# Patient Record
Sex: Female | Born: 1935 | Race: White | Hispanic: No | Marital: Married | Smoking: Never smoker
Health system: Southern US, Community
[De-identification: ages and names within clinical notes are randomized; demographics above are authoritative.]

## PROBLEM LIST (undated history)

## (undated) DIAGNOSIS — I2510 Atherosclerotic heart disease of native coronary artery without angina pectoris: Secondary | ICD-10-CM

## (undated) DIAGNOSIS — J984 Other disorders of lung: Secondary | ICD-10-CM

## (undated) DIAGNOSIS — M1990 Unspecified osteoarthritis, unspecified site: Secondary | ICD-10-CM

## (undated) DIAGNOSIS — R6251 Failure to thrive (child): Secondary | ICD-10-CM

## (undated) DIAGNOSIS — I10 Essential (primary) hypertension: Secondary | ICD-10-CM

## (undated) DIAGNOSIS — I639 Cerebral infarction, unspecified: Secondary | ICD-10-CM

## (undated) DIAGNOSIS — E079 Disorder of thyroid, unspecified: Secondary | ICD-10-CM

## (undated) DIAGNOSIS — J189 Pneumonia, unspecified organism: Secondary | ICD-10-CM

## (undated) DIAGNOSIS — D649 Anemia, unspecified: Secondary | ICD-10-CM

## (undated) DIAGNOSIS — S065X9A Traumatic subdural hemorrhage with loss of consciousness of unspecified duration, initial encounter: Secondary | ICD-10-CM

## (undated) DIAGNOSIS — S065XAA Traumatic subdural hemorrhage with loss of consciousness status unknown, initial encounter: Secondary | ICD-10-CM

## (undated) DIAGNOSIS — E46 Unspecified protein-calorie malnutrition: Secondary | ICD-10-CM

## (undated) DIAGNOSIS — F0390 Unspecified dementia without behavioral disturbance: Secondary | ICD-10-CM

## (undated) DIAGNOSIS — I69391 Dysphagia following cerebral infarction: Secondary | ICD-10-CM

## (undated) DIAGNOSIS — E119 Type 2 diabetes mellitus without complications: Secondary | ICD-10-CM

---

## 2015-03-13 DIAGNOSIS — I6529 Occlusion and stenosis of unspecified carotid artery: Secondary | ICD-10-CM | POA: Diagnosis not present

## 2015-03-13 DIAGNOSIS — Z7982 Long term (current) use of aspirin: Secondary | ICD-10-CM | POA: Diagnosis not present

## 2015-03-13 DIAGNOSIS — I10 Essential (primary) hypertension: Secondary | ICD-10-CM | POA: Diagnosis not present

## 2015-03-13 DIAGNOSIS — E119 Type 2 diabetes mellitus without complications: Secondary | ICD-10-CM | POA: Diagnosis not present

## 2015-03-13 DIAGNOSIS — L409 Psoriasis, unspecified: Secondary | ICD-10-CM | POA: Diagnosis not present

## 2015-03-13 DIAGNOSIS — E039 Hypothyroidism, unspecified: Secondary | ICD-10-CM | POA: Diagnosis not present

## 2015-03-13 DIAGNOSIS — I679 Cerebrovascular disease, unspecified: Secondary | ICD-10-CM | POA: Diagnosis not present

## 2015-03-13 DIAGNOSIS — L02211 Cutaneous abscess of abdominal wall: Secondary | ICD-10-CM | POA: Diagnosis not present

## 2015-03-13 DIAGNOSIS — Z7984 Long term (current) use of oral hypoglycemic drugs: Secondary | ICD-10-CM | POA: Diagnosis not present

## 2015-03-13 DIAGNOSIS — M6281 Muscle weakness (generalized): Secondary | ICD-10-CM | POA: Diagnosis not present

## 2015-03-13 DIAGNOSIS — R001 Bradycardia, unspecified: Secondary | ICD-10-CM | POA: Diagnosis not present

## 2015-03-13 DIAGNOSIS — Z87440 Personal history of urinary (tract) infections: Secondary | ICD-10-CM | POA: Diagnosis not present

## 2015-03-13 DIAGNOSIS — H409 Unspecified glaucoma: Secondary | ICD-10-CM | POA: Diagnosis not present

## 2015-03-13 DIAGNOSIS — E785 Hyperlipidemia, unspecified: Secondary | ICD-10-CM | POA: Diagnosis not present

## 2015-03-15 DIAGNOSIS — L02211 Cutaneous abscess of abdominal wall: Secondary | ICD-10-CM | POA: Diagnosis not present

## 2015-03-15 DIAGNOSIS — I679 Cerebrovascular disease, unspecified: Secondary | ICD-10-CM | POA: Diagnosis not present

## 2015-03-15 DIAGNOSIS — E119 Type 2 diabetes mellitus without complications: Secondary | ICD-10-CM | POA: Diagnosis not present

## 2015-03-15 DIAGNOSIS — E785 Hyperlipidemia, unspecified: Secondary | ICD-10-CM | POA: Diagnosis not present

## 2015-03-15 DIAGNOSIS — M6281 Muscle weakness (generalized): Secondary | ICD-10-CM | POA: Diagnosis not present

## 2015-03-15 DIAGNOSIS — I10 Essential (primary) hypertension: Secondary | ICD-10-CM | POA: Diagnosis not present

## 2015-03-17 DIAGNOSIS — I10 Essential (primary) hypertension: Secondary | ICD-10-CM | POA: Diagnosis not present

## 2015-03-17 DIAGNOSIS — E785 Hyperlipidemia, unspecified: Secondary | ICD-10-CM | POA: Diagnosis not present

## 2015-03-17 DIAGNOSIS — E119 Type 2 diabetes mellitus without complications: Secondary | ICD-10-CM | POA: Diagnosis not present

## 2015-03-17 DIAGNOSIS — M6281 Muscle weakness (generalized): Secondary | ICD-10-CM | POA: Diagnosis not present

## 2015-03-17 DIAGNOSIS — I679 Cerebrovascular disease, unspecified: Secondary | ICD-10-CM | POA: Diagnosis not present

## 2015-03-17 DIAGNOSIS — L02211 Cutaneous abscess of abdominal wall: Secondary | ICD-10-CM | POA: Diagnosis not present

## 2015-03-18 DIAGNOSIS — L02211 Cutaneous abscess of abdominal wall: Secondary | ICD-10-CM | POA: Diagnosis not present

## 2015-03-18 DIAGNOSIS — E119 Type 2 diabetes mellitus without complications: Secondary | ICD-10-CM | POA: Diagnosis not present

## 2015-03-18 DIAGNOSIS — Z09 Encounter for follow-up examination after completed treatment for conditions other than malignant neoplasm: Secondary | ICD-10-CM | POA: Diagnosis not present

## 2015-03-18 DIAGNOSIS — E559 Vitamin D deficiency, unspecified: Secondary | ICD-10-CM | POA: Diagnosis not present

## 2015-03-18 DIAGNOSIS — I10 Essential (primary) hypertension: Secondary | ICD-10-CM | POA: Diagnosis not present

## 2015-03-18 DIAGNOSIS — M810 Age-related osteoporosis without current pathological fracture: Secondary | ICD-10-CM | POA: Diagnosis not present

## 2015-03-18 DIAGNOSIS — E785 Hyperlipidemia, unspecified: Secondary | ICD-10-CM | POA: Diagnosis not present

## 2015-03-18 DIAGNOSIS — M6281 Muscle weakness (generalized): Secondary | ICD-10-CM | POA: Diagnosis not present

## 2015-03-18 DIAGNOSIS — I679 Cerebrovascular disease, unspecified: Secondary | ICD-10-CM | POA: Diagnosis not present

## 2015-03-19 DIAGNOSIS — L02211 Cutaneous abscess of abdominal wall: Secondary | ICD-10-CM | POA: Diagnosis not present

## 2015-03-19 DIAGNOSIS — Z719 Counseling, unspecified: Secondary | ICD-10-CM | POA: Diagnosis not present

## 2015-03-19 DIAGNOSIS — Z9889 Other specified postprocedural states: Secondary | ICD-10-CM | POA: Diagnosis not present

## 2015-03-20 DIAGNOSIS — E785 Hyperlipidemia, unspecified: Secondary | ICD-10-CM | POA: Diagnosis not present

## 2015-03-20 DIAGNOSIS — I10 Essential (primary) hypertension: Secondary | ICD-10-CM | POA: Diagnosis not present

## 2015-03-20 DIAGNOSIS — E119 Type 2 diabetes mellitus without complications: Secondary | ICD-10-CM | POA: Diagnosis not present

## 2015-03-20 DIAGNOSIS — I679 Cerebrovascular disease, unspecified: Secondary | ICD-10-CM | POA: Diagnosis not present

## 2015-03-20 DIAGNOSIS — L02211 Cutaneous abscess of abdominal wall: Secondary | ICD-10-CM | POA: Diagnosis not present

## 2015-03-20 DIAGNOSIS — M6281 Muscle weakness (generalized): Secondary | ICD-10-CM | POA: Diagnosis not present

## 2015-03-21 DIAGNOSIS — I679 Cerebrovascular disease, unspecified: Secondary | ICD-10-CM | POA: Diagnosis not present

## 2015-03-21 DIAGNOSIS — E785 Hyperlipidemia, unspecified: Secondary | ICD-10-CM | POA: Diagnosis not present

## 2015-03-21 DIAGNOSIS — E119 Type 2 diabetes mellitus without complications: Secondary | ICD-10-CM | POA: Diagnosis not present

## 2015-03-21 DIAGNOSIS — M6281 Muscle weakness (generalized): Secondary | ICD-10-CM | POA: Diagnosis not present

## 2015-03-21 DIAGNOSIS — I10 Essential (primary) hypertension: Secondary | ICD-10-CM | POA: Diagnosis not present

## 2015-03-21 DIAGNOSIS — L02211 Cutaneous abscess of abdominal wall: Secondary | ICD-10-CM | POA: Diagnosis not present

## 2015-03-24 DIAGNOSIS — M6281 Muscle weakness (generalized): Secondary | ICD-10-CM | POA: Diagnosis not present

## 2015-03-24 DIAGNOSIS — E119 Type 2 diabetes mellitus without complications: Secondary | ICD-10-CM | POA: Diagnosis not present

## 2015-03-24 DIAGNOSIS — L02211 Cutaneous abscess of abdominal wall: Secondary | ICD-10-CM | POA: Diagnosis not present

## 2015-03-24 DIAGNOSIS — I10 Essential (primary) hypertension: Secondary | ICD-10-CM | POA: Diagnosis not present

## 2015-03-24 DIAGNOSIS — E785 Hyperlipidemia, unspecified: Secondary | ICD-10-CM | POA: Diagnosis not present

## 2015-03-24 DIAGNOSIS — I679 Cerebrovascular disease, unspecified: Secondary | ICD-10-CM | POA: Diagnosis not present

## 2015-03-26 DIAGNOSIS — I679 Cerebrovascular disease, unspecified: Secondary | ICD-10-CM | POA: Diagnosis not present

## 2015-03-26 DIAGNOSIS — E785 Hyperlipidemia, unspecified: Secondary | ICD-10-CM | POA: Diagnosis not present

## 2015-03-26 DIAGNOSIS — I10 Essential (primary) hypertension: Secondary | ICD-10-CM | POA: Diagnosis not present

## 2015-03-26 DIAGNOSIS — E119 Type 2 diabetes mellitus without complications: Secondary | ICD-10-CM | POA: Diagnosis not present

## 2015-03-26 DIAGNOSIS — M6281 Muscle weakness (generalized): Secondary | ICD-10-CM | POA: Diagnosis not present

## 2015-03-26 DIAGNOSIS — L02211 Cutaneous abscess of abdominal wall: Secondary | ICD-10-CM | POA: Diagnosis not present

## 2015-03-28 DIAGNOSIS — I10 Essential (primary) hypertension: Secondary | ICD-10-CM | POA: Diagnosis not present

## 2015-03-28 DIAGNOSIS — I679 Cerebrovascular disease, unspecified: Secondary | ICD-10-CM | POA: Diagnosis not present

## 2015-03-28 DIAGNOSIS — L02211 Cutaneous abscess of abdominal wall: Secondary | ICD-10-CM | POA: Diagnosis not present

## 2015-03-28 DIAGNOSIS — E119 Type 2 diabetes mellitus without complications: Secondary | ICD-10-CM | POA: Diagnosis not present

## 2015-03-28 DIAGNOSIS — M6281 Muscle weakness (generalized): Secondary | ICD-10-CM | POA: Diagnosis not present

## 2015-03-28 DIAGNOSIS — E785 Hyperlipidemia, unspecified: Secondary | ICD-10-CM | POA: Diagnosis not present

## 2015-03-31 DIAGNOSIS — E785 Hyperlipidemia, unspecified: Secondary | ICD-10-CM | POA: Diagnosis not present

## 2015-03-31 DIAGNOSIS — M6281 Muscle weakness (generalized): Secondary | ICD-10-CM | POA: Diagnosis not present

## 2015-03-31 DIAGNOSIS — L02211 Cutaneous abscess of abdominal wall: Secondary | ICD-10-CM | POA: Diagnosis not present

## 2015-03-31 DIAGNOSIS — E119 Type 2 diabetes mellitus without complications: Secondary | ICD-10-CM | POA: Diagnosis not present

## 2015-03-31 DIAGNOSIS — I10 Essential (primary) hypertension: Secondary | ICD-10-CM | POA: Diagnosis not present

## 2015-03-31 DIAGNOSIS — I679 Cerebrovascular disease, unspecified: Secondary | ICD-10-CM | POA: Diagnosis not present

## 2015-04-02 DIAGNOSIS — I10 Essential (primary) hypertension: Secondary | ICD-10-CM | POA: Diagnosis not present

## 2015-04-02 DIAGNOSIS — Z8673 Personal history of transient ischemic attack (TIA), and cerebral infarction without residual deficits: Secondary | ICD-10-CM | POA: Diagnosis not present

## 2015-04-02 DIAGNOSIS — K5792 Diverticulitis of intestine, part unspecified, without perforation or abscess without bleeding: Secondary | ICD-10-CM | POA: Diagnosis not present

## 2015-04-02 DIAGNOSIS — I679 Cerebrovascular disease, unspecified: Secondary | ICD-10-CM | POA: Diagnosis not present

## 2015-04-02 DIAGNOSIS — T8189XA Other complications of procedures, not elsewhere classified, initial encounter: Secondary | ICD-10-CM | POA: Diagnosis not present

## 2015-04-02 DIAGNOSIS — E119 Type 2 diabetes mellitus without complications: Secondary | ICD-10-CM | POA: Diagnosis not present

## 2015-04-02 DIAGNOSIS — K632 Fistula of intestine: Secondary | ICD-10-CM | POA: Diagnosis not present

## 2015-04-02 DIAGNOSIS — R109 Unspecified abdominal pain: Secondary | ICD-10-CM | POA: Diagnosis not present

## 2015-04-02 DIAGNOSIS — F0390 Unspecified dementia without behavioral disturbance: Secondary | ICD-10-CM | POA: Diagnosis not present

## 2015-04-02 DIAGNOSIS — M6281 Muscle weakness (generalized): Secondary | ICD-10-CM | POA: Diagnosis not present

## 2015-04-02 DIAGNOSIS — Z87891 Personal history of nicotine dependence: Secondary | ICD-10-CM | POA: Diagnosis not present

## 2015-04-02 DIAGNOSIS — L02211 Cutaneous abscess of abdominal wall: Secondary | ICD-10-CM | POA: Diagnosis not present

## 2015-04-02 DIAGNOSIS — L0291 Cutaneous abscess, unspecified: Secondary | ICD-10-CM | POA: Diagnosis not present

## 2015-04-02 DIAGNOSIS — E785 Hyperlipidemia, unspecified: Secondary | ICD-10-CM | POA: Diagnosis not present

## 2015-04-04 DIAGNOSIS — I10 Essential (primary) hypertension: Secondary | ICD-10-CM | POA: Diagnosis not present

## 2015-04-04 DIAGNOSIS — L02211 Cutaneous abscess of abdominal wall: Secondary | ICD-10-CM | POA: Diagnosis not present

## 2015-04-04 DIAGNOSIS — E119 Type 2 diabetes mellitus without complications: Secondary | ICD-10-CM | POA: Diagnosis not present

## 2015-04-04 DIAGNOSIS — E785 Hyperlipidemia, unspecified: Secondary | ICD-10-CM | POA: Diagnosis not present

## 2015-04-04 DIAGNOSIS — M6281 Muscle weakness (generalized): Secondary | ICD-10-CM | POA: Diagnosis not present

## 2015-04-04 DIAGNOSIS — I679 Cerebrovascular disease, unspecified: Secondary | ICD-10-CM | POA: Diagnosis not present

## 2015-04-07 DIAGNOSIS — I679 Cerebrovascular disease, unspecified: Secondary | ICD-10-CM | POA: Diagnosis not present

## 2015-04-07 DIAGNOSIS — E119 Type 2 diabetes mellitus without complications: Secondary | ICD-10-CM | POA: Diagnosis not present

## 2015-04-07 DIAGNOSIS — I10 Essential (primary) hypertension: Secondary | ICD-10-CM | POA: Diagnosis not present

## 2015-04-07 DIAGNOSIS — M6281 Muscle weakness (generalized): Secondary | ICD-10-CM | POA: Diagnosis not present

## 2015-04-07 DIAGNOSIS — E785 Hyperlipidemia, unspecified: Secondary | ICD-10-CM | POA: Diagnosis not present

## 2015-04-07 DIAGNOSIS — L02211 Cutaneous abscess of abdominal wall: Secondary | ICD-10-CM | POA: Diagnosis not present

## 2015-04-08 DIAGNOSIS — L259 Unspecified contact dermatitis, unspecified cause: Secondary | ICD-10-CM | POA: Diagnosis not present

## 2015-04-08 DIAGNOSIS — Z719 Counseling, unspecified: Secondary | ICD-10-CM | POA: Diagnosis not present

## 2015-04-08 DIAGNOSIS — L02211 Cutaneous abscess of abdominal wall: Secondary | ICD-10-CM | POA: Diagnosis not present

## 2015-04-09 DIAGNOSIS — E785 Hyperlipidemia, unspecified: Secondary | ICD-10-CM | POA: Diagnosis not present

## 2015-04-09 DIAGNOSIS — I679 Cerebrovascular disease, unspecified: Secondary | ICD-10-CM | POA: Diagnosis not present

## 2015-04-09 DIAGNOSIS — I10 Essential (primary) hypertension: Secondary | ICD-10-CM | POA: Diagnosis not present

## 2015-04-09 DIAGNOSIS — L02211 Cutaneous abscess of abdominal wall: Secondary | ICD-10-CM | POA: Diagnosis not present

## 2015-04-09 DIAGNOSIS — M6281 Muscle weakness (generalized): Secondary | ICD-10-CM | POA: Diagnosis not present

## 2015-04-09 DIAGNOSIS — E119 Type 2 diabetes mellitus without complications: Secondary | ICD-10-CM | POA: Diagnosis not present

## 2015-04-10 DIAGNOSIS — E119 Type 2 diabetes mellitus without complications: Secondary | ICD-10-CM | POA: Diagnosis not present

## 2015-04-10 DIAGNOSIS — I10 Essential (primary) hypertension: Secondary | ICD-10-CM | POA: Diagnosis not present

## 2015-04-10 DIAGNOSIS — I679 Cerebrovascular disease, unspecified: Secondary | ICD-10-CM | POA: Diagnosis not present

## 2015-04-10 DIAGNOSIS — L02211 Cutaneous abscess of abdominal wall: Secondary | ICD-10-CM | POA: Diagnosis not present

## 2015-04-10 DIAGNOSIS — E785 Hyperlipidemia, unspecified: Secondary | ICD-10-CM | POA: Diagnosis not present

## 2015-04-10 DIAGNOSIS — M6281 Muscle weakness (generalized): Secondary | ICD-10-CM | POA: Diagnosis not present

## 2015-04-11 DIAGNOSIS — I679 Cerebrovascular disease, unspecified: Secondary | ICD-10-CM | POA: Diagnosis not present

## 2015-04-11 DIAGNOSIS — L02211 Cutaneous abscess of abdominal wall: Secondary | ICD-10-CM | POA: Diagnosis not present

## 2015-04-11 DIAGNOSIS — E119 Type 2 diabetes mellitus without complications: Secondary | ICD-10-CM | POA: Diagnosis not present

## 2015-04-11 DIAGNOSIS — I10 Essential (primary) hypertension: Secondary | ICD-10-CM | POA: Diagnosis not present

## 2015-04-11 DIAGNOSIS — M6281 Muscle weakness (generalized): Secondary | ICD-10-CM | POA: Diagnosis not present

## 2015-04-11 DIAGNOSIS — E785 Hyperlipidemia, unspecified: Secondary | ICD-10-CM | POA: Diagnosis not present

## 2015-04-12 DIAGNOSIS — E119 Type 2 diabetes mellitus without complications: Secondary | ICD-10-CM | POA: Diagnosis not present

## 2015-04-12 DIAGNOSIS — I679 Cerebrovascular disease, unspecified: Secondary | ICD-10-CM | POA: Diagnosis not present

## 2015-04-12 DIAGNOSIS — I10 Essential (primary) hypertension: Secondary | ICD-10-CM | POA: Diagnosis not present

## 2015-04-12 DIAGNOSIS — E785 Hyperlipidemia, unspecified: Secondary | ICD-10-CM | POA: Diagnosis not present

## 2015-04-12 DIAGNOSIS — M6281 Muscle weakness (generalized): Secondary | ICD-10-CM | POA: Diagnosis not present

## 2015-04-12 DIAGNOSIS — L02211 Cutaneous abscess of abdominal wall: Secondary | ICD-10-CM | POA: Diagnosis not present

## 2015-04-13 DIAGNOSIS — E119 Type 2 diabetes mellitus without complications: Secondary | ICD-10-CM | POA: Diagnosis not present

## 2015-04-13 DIAGNOSIS — E785 Hyperlipidemia, unspecified: Secondary | ICD-10-CM | POA: Diagnosis not present

## 2015-04-13 DIAGNOSIS — Z87891 Personal history of nicotine dependence: Secondary | ICD-10-CM | POA: Diagnosis not present

## 2015-04-13 DIAGNOSIS — Z9889 Other specified postprocedural states: Secondary | ICD-10-CM | POA: Diagnosis not present

## 2015-04-13 DIAGNOSIS — L02211 Cutaneous abscess of abdominal wall: Secondary | ICD-10-CM | POA: Diagnosis not present

## 2015-04-13 DIAGNOSIS — B999 Unspecified infectious disease: Secondary | ICD-10-CM | POA: Diagnosis not present

## 2015-04-13 DIAGNOSIS — I10 Essential (primary) hypertension: Secondary | ICD-10-CM | POA: Diagnosis not present

## 2015-04-13 DIAGNOSIS — Z7982 Long term (current) use of aspirin: Secondary | ICD-10-CM | POA: Diagnosis not present

## 2015-04-13 DIAGNOSIS — Z933 Colostomy status: Secondary | ICD-10-CM | POA: Diagnosis not present

## 2015-04-13 DIAGNOSIS — M6281 Muscle weakness (generalized): Secondary | ICD-10-CM | POA: Diagnosis not present

## 2015-04-13 DIAGNOSIS — I679 Cerebrovascular disease, unspecified: Secondary | ICD-10-CM | POA: Diagnosis not present

## 2015-04-13 DIAGNOSIS — Z7984 Long term (current) use of oral hypoglycemic drugs: Secondary | ICD-10-CM | POA: Diagnosis not present

## 2015-04-13 DIAGNOSIS — N390 Urinary tract infection, site not specified: Secondary | ICD-10-CM | POA: Diagnosis not present

## 2015-04-13 DIAGNOSIS — T8189XA Other complications of procedures, not elsewhere classified, initial encounter: Secondary | ICD-10-CM | POA: Diagnosis not present

## 2015-04-13 DIAGNOSIS — Z9049 Acquired absence of other specified parts of digestive tract: Secondary | ICD-10-CM | POA: Diagnosis not present

## 2015-04-13 DIAGNOSIS — K632 Fistula of intestine: Secondary | ICD-10-CM | POA: Diagnosis not present

## 2015-04-13 DIAGNOSIS — Z8673 Personal history of transient ischemic attack (TIA), and cerebral infarction without residual deficits: Secondary | ICD-10-CM | POA: Diagnosis not present

## 2015-04-14 DIAGNOSIS — E785 Hyperlipidemia, unspecified: Secondary | ICD-10-CM | POA: Diagnosis not present

## 2015-04-14 DIAGNOSIS — I679 Cerebrovascular disease, unspecified: Secondary | ICD-10-CM | POA: Diagnosis not present

## 2015-04-14 DIAGNOSIS — I10 Essential (primary) hypertension: Secondary | ICD-10-CM | POA: Diagnosis not present

## 2015-04-14 DIAGNOSIS — E119 Type 2 diabetes mellitus without complications: Secondary | ICD-10-CM | POA: Diagnosis not present

## 2015-04-14 DIAGNOSIS — M6281 Muscle weakness (generalized): Secondary | ICD-10-CM | POA: Diagnosis not present

## 2015-04-14 DIAGNOSIS — L02211 Cutaneous abscess of abdominal wall: Secondary | ICD-10-CM | POA: Diagnosis not present

## 2015-04-15 DIAGNOSIS — L02211 Cutaneous abscess of abdominal wall: Secondary | ICD-10-CM | POA: Diagnosis not present

## 2015-04-15 DIAGNOSIS — M6281 Muscle weakness (generalized): Secondary | ICD-10-CM | POA: Diagnosis not present

## 2015-04-15 DIAGNOSIS — I679 Cerebrovascular disease, unspecified: Secondary | ICD-10-CM | POA: Diagnosis not present

## 2015-04-15 DIAGNOSIS — E785 Hyperlipidemia, unspecified: Secondary | ICD-10-CM | POA: Diagnosis not present

## 2015-04-15 DIAGNOSIS — E119 Type 2 diabetes mellitus without complications: Secondary | ICD-10-CM | POA: Diagnosis not present

## 2015-04-15 DIAGNOSIS — I10 Essential (primary) hypertension: Secondary | ICD-10-CM | POA: Diagnosis not present

## 2015-04-16 DIAGNOSIS — I679 Cerebrovascular disease, unspecified: Secondary | ICD-10-CM | POA: Diagnosis not present

## 2015-04-16 DIAGNOSIS — E119 Type 2 diabetes mellitus without complications: Secondary | ICD-10-CM | POA: Diagnosis not present

## 2015-04-16 DIAGNOSIS — E785 Hyperlipidemia, unspecified: Secondary | ICD-10-CM | POA: Diagnosis not present

## 2015-04-16 DIAGNOSIS — M6281 Muscle weakness (generalized): Secondary | ICD-10-CM | POA: Diagnosis not present

## 2015-04-16 DIAGNOSIS — I10 Essential (primary) hypertension: Secondary | ICD-10-CM | POA: Diagnosis not present

## 2015-04-16 DIAGNOSIS — L02211 Cutaneous abscess of abdominal wall: Secondary | ICD-10-CM | POA: Diagnosis not present

## 2015-04-17 DIAGNOSIS — E785 Hyperlipidemia, unspecified: Secondary | ICD-10-CM | POA: Diagnosis not present

## 2015-04-17 DIAGNOSIS — M6281 Muscle weakness (generalized): Secondary | ICD-10-CM | POA: Diagnosis not present

## 2015-04-17 DIAGNOSIS — E119 Type 2 diabetes mellitus without complications: Secondary | ICD-10-CM | POA: Diagnosis not present

## 2015-04-17 DIAGNOSIS — I679 Cerebrovascular disease, unspecified: Secondary | ICD-10-CM | POA: Diagnosis not present

## 2015-04-17 DIAGNOSIS — L02211 Cutaneous abscess of abdominal wall: Secondary | ICD-10-CM | POA: Diagnosis not present

## 2015-04-17 DIAGNOSIS — I10 Essential (primary) hypertension: Secondary | ICD-10-CM | POA: Diagnosis not present

## 2015-04-18 DIAGNOSIS — I10 Essential (primary) hypertension: Secondary | ICD-10-CM | POA: Diagnosis not present

## 2015-04-18 DIAGNOSIS — E119 Type 2 diabetes mellitus without complications: Secondary | ICD-10-CM | POA: Diagnosis not present

## 2015-04-18 DIAGNOSIS — M6281 Muscle weakness (generalized): Secondary | ICD-10-CM | POA: Diagnosis not present

## 2015-04-18 DIAGNOSIS — I679 Cerebrovascular disease, unspecified: Secondary | ICD-10-CM | POA: Diagnosis not present

## 2015-04-18 DIAGNOSIS — E785 Hyperlipidemia, unspecified: Secondary | ICD-10-CM | POA: Diagnosis not present

## 2015-04-18 DIAGNOSIS — L02211 Cutaneous abscess of abdominal wall: Secondary | ICD-10-CM | POA: Diagnosis not present

## 2015-04-19 DIAGNOSIS — E119 Type 2 diabetes mellitus without complications: Secondary | ICD-10-CM | POA: Diagnosis not present

## 2015-04-19 DIAGNOSIS — L02211 Cutaneous abscess of abdominal wall: Secondary | ICD-10-CM | POA: Diagnosis not present

## 2015-04-19 DIAGNOSIS — I679 Cerebrovascular disease, unspecified: Secondary | ICD-10-CM | POA: Diagnosis not present

## 2015-04-19 DIAGNOSIS — I10 Essential (primary) hypertension: Secondary | ICD-10-CM | POA: Diagnosis not present

## 2015-04-19 DIAGNOSIS — E785 Hyperlipidemia, unspecified: Secondary | ICD-10-CM | POA: Diagnosis not present

## 2015-04-19 DIAGNOSIS — M6281 Muscle weakness (generalized): Secondary | ICD-10-CM | POA: Diagnosis not present

## 2015-04-20 DIAGNOSIS — I679 Cerebrovascular disease, unspecified: Secondary | ICD-10-CM | POA: Diagnosis not present

## 2015-04-20 DIAGNOSIS — E785 Hyperlipidemia, unspecified: Secondary | ICD-10-CM | POA: Diagnosis not present

## 2015-04-20 DIAGNOSIS — I10 Essential (primary) hypertension: Secondary | ICD-10-CM | POA: Diagnosis not present

## 2015-04-20 DIAGNOSIS — L02211 Cutaneous abscess of abdominal wall: Secondary | ICD-10-CM | POA: Diagnosis not present

## 2015-04-20 DIAGNOSIS — M6281 Muscle weakness (generalized): Secondary | ICD-10-CM | POA: Diagnosis not present

## 2015-04-20 DIAGNOSIS — E119 Type 2 diabetes mellitus without complications: Secondary | ICD-10-CM | POA: Diagnosis not present

## 2015-04-21 DIAGNOSIS — M6281 Muscle weakness (generalized): Secondary | ICD-10-CM | POA: Diagnosis not present

## 2015-04-21 DIAGNOSIS — L02211 Cutaneous abscess of abdominal wall: Secondary | ICD-10-CM | POA: Diagnosis not present

## 2015-04-21 DIAGNOSIS — I679 Cerebrovascular disease, unspecified: Secondary | ICD-10-CM | POA: Diagnosis not present

## 2015-04-21 DIAGNOSIS — E119 Type 2 diabetes mellitus without complications: Secondary | ICD-10-CM | POA: Diagnosis not present

## 2015-04-21 DIAGNOSIS — E785 Hyperlipidemia, unspecified: Secondary | ICD-10-CM | POA: Diagnosis not present

## 2015-04-21 DIAGNOSIS — I10 Essential (primary) hypertension: Secondary | ICD-10-CM | POA: Diagnosis not present

## 2015-04-23 DIAGNOSIS — E7800 Pure hypercholesterolemia, unspecified: Secondary | ICD-10-CM | POA: Diagnosis not present

## 2015-04-23 DIAGNOSIS — I10 Essential (primary) hypertension: Secondary | ICD-10-CM | POA: Diagnosis not present

## 2015-04-23 DIAGNOSIS — E119 Type 2 diabetes mellitus without complications: Secondary | ICD-10-CM | POA: Diagnosis not present

## 2015-04-23 DIAGNOSIS — E785 Hyperlipidemia, unspecified: Secondary | ICD-10-CM | POA: Diagnosis not present

## 2015-04-23 DIAGNOSIS — L02211 Cutaneous abscess of abdominal wall: Secondary | ICD-10-CM | POA: Diagnosis not present

## 2015-04-23 DIAGNOSIS — I679 Cerebrovascular disease, unspecified: Secondary | ICD-10-CM | POA: Diagnosis not present

## 2015-04-23 DIAGNOSIS — I130 Hypertensive heart and chronic kidney disease with heart failure and stage 1 through stage 4 chronic kidney disease, or unspecified chronic kidney disease: Secondary | ICD-10-CM | POA: Diagnosis not present

## 2015-04-23 DIAGNOSIS — M6281 Muscle weakness (generalized): Secondary | ICD-10-CM | POA: Diagnosis not present

## 2015-04-25 DIAGNOSIS — E119 Type 2 diabetes mellitus without complications: Secondary | ICD-10-CM | POA: Diagnosis not present

## 2015-04-25 DIAGNOSIS — L02211 Cutaneous abscess of abdominal wall: Secondary | ICD-10-CM | POA: Diagnosis not present

## 2015-04-25 DIAGNOSIS — E785 Hyperlipidemia, unspecified: Secondary | ICD-10-CM | POA: Diagnosis not present

## 2015-04-25 DIAGNOSIS — I10 Essential (primary) hypertension: Secondary | ICD-10-CM | POA: Diagnosis not present

## 2015-04-25 DIAGNOSIS — M6281 Muscle weakness (generalized): Secondary | ICD-10-CM | POA: Diagnosis not present

## 2015-04-25 DIAGNOSIS — I679 Cerebrovascular disease, unspecified: Secondary | ICD-10-CM | POA: Diagnosis not present

## 2015-04-28 DIAGNOSIS — E785 Hyperlipidemia, unspecified: Secondary | ICD-10-CM | POA: Diagnosis not present

## 2015-04-28 DIAGNOSIS — L02211 Cutaneous abscess of abdominal wall: Secondary | ICD-10-CM | POA: Diagnosis not present

## 2015-04-28 DIAGNOSIS — I679 Cerebrovascular disease, unspecified: Secondary | ICD-10-CM | POA: Diagnosis not present

## 2015-04-28 DIAGNOSIS — E119 Type 2 diabetes mellitus without complications: Secondary | ICD-10-CM | POA: Diagnosis not present

## 2015-04-28 DIAGNOSIS — M6281 Muscle weakness (generalized): Secondary | ICD-10-CM | POA: Diagnosis not present

## 2015-04-28 DIAGNOSIS — I10 Essential (primary) hypertension: Secondary | ICD-10-CM | POA: Diagnosis not present

## 2015-04-30 DIAGNOSIS — I679 Cerebrovascular disease, unspecified: Secondary | ICD-10-CM | POA: Diagnosis not present

## 2015-04-30 DIAGNOSIS — E785 Hyperlipidemia, unspecified: Secondary | ICD-10-CM | POA: Diagnosis not present

## 2015-04-30 DIAGNOSIS — L02211 Cutaneous abscess of abdominal wall: Secondary | ICD-10-CM | POA: Diagnosis not present

## 2015-04-30 DIAGNOSIS — I10 Essential (primary) hypertension: Secondary | ICD-10-CM | POA: Diagnosis not present

## 2015-04-30 DIAGNOSIS — M6281 Muscle weakness (generalized): Secondary | ICD-10-CM | POA: Diagnosis not present

## 2015-04-30 DIAGNOSIS — E119 Type 2 diabetes mellitus without complications: Secondary | ICD-10-CM | POA: Diagnosis not present

## 2015-05-02 DIAGNOSIS — E119 Type 2 diabetes mellitus without complications: Secondary | ICD-10-CM | POA: Diagnosis not present

## 2015-05-02 DIAGNOSIS — I679 Cerebrovascular disease, unspecified: Secondary | ICD-10-CM | POA: Diagnosis not present

## 2015-05-02 DIAGNOSIS — M6281 Muscle weakness (generalized): Secondary | ICD-10-CM | POA: Diagnosis not present

## 2015-05-02 DIAGNOSIS — L02211 Cutaneous abscess of abdominal wall: Secondary | ICD-10-CM | POA: Diagnosis not present

## 2015-05-02 DIAGNOSIS — I10 Essential (primary) hypertension: Secondary | ICD-10-CM | POA: Diagnosis not present

## 2015-05-02 DIAGNOSIS — E785 Hyperlipidemia, unspecified: Secondary | ICD-10-CM | POA: Diagnosis not present

## 2015-05-05 DIAGNOSIS — M6281 Muscle weakness (generalized): Secondary | ICD-10-CM | POA: Diagnosis not present

## 2015-05-05 DIAGNOSIS — I10 Essential (primary) hypertension: Secondary | ICD-10-CM | POA: Diagnosis not present

## 2015-05-05 DIAGNOSIS — E785 Hyperlipidemia, unspecified: Secondary | ICD-10-CM | POA: Diagnosis not present

## 2015-05-05 DIAGNOSIS — I679 Cerebrovascular disease, unspecified: Secondary | ICD-10-CM | POA: Diagnosis not present

## 2015-05-05 DIAGNOSIS — E119 Type 2 diabetes mellitus without complications: Secondary | ICD-10-CM | POA: Diagnosis not present

## 2015-05-05 DIAGNOSIS — L02211 Cutaneous abscess of abdominal wall: Secondary | ICD-10-CM | POA: Diagnosis not present

## 2015-05-07 DIAGNOSIS — L02211 Cutaneous abscess of abdominal wall: Secondary | ICD-10-CM | POA: Diagnosis not present

## 2015-05-07 DIAGNOSIS — M6281 Muscle weakness (generalized): Secondary | ICD-10-CM | POA: Diagnosis not present

## 2015-05-07 DIAGNOSIS — E119 Type 2 diabetes mellitus without complications: Secondary | ICD-10-CM | POA: Diagnosis not present

## 2015-05-07 DIAGNOSIS — E785 Hyperlipidemia, unspecified: Secondary | ICD-10-CM | POA: Diagnosis not present

## 2015-05-07 DIAGNOSIS — I679 Cerebrovascular disease, unspecified: Secondary | ICD-10-CM | POA: Diagnosis not present

## 2015-05-07 DIAGNOSIS — I10 Essential (primary) hypertension: Secondary | ICD-10-CM | POA: Diagnosis not present

## 2015-05-08 DIAGNOSIS — L02211 Cutaneous abscess of abdominal wall: Secondary | ICD-10-CM | POA: Diagnosis not present

## 2015-05-08 DIAGNOSIS — Z719 Counseling, unspecified: Secondary | ICD-10-CM | POA: Diagnosis not present

## 2015-05-09 DIAGNOSIS — L02211 Cutaneous abscess of abdominal wall: Secondary | ICD-10-CM | POA: Diagnosis not present

## 2015-05-09 DIAGNOSIS — E119 Type 2 diabetes mellitus without complications: Secondary | ICD-10-CM | POA: Diagnosis not present

## 2015-05-09 DIAGNOSIS — I10 Essential (primary) hypertension: Secondary | ICD-10-CM | POA: Diagnosis not present

## 2015-05-09 DIAGNOSIS — M6281 Muscle weakness (generalized): Secondary | ICD-10-CM | POA: Diagnosis not present

## 2015-05-09 DIAGNOSIS — I679 Cerebrovascular disease, unspecified: Secondary | ICD-10-CM | POA: Diagnosis not present

## 2015-05-09 DIAGNOSIS — E785 Hyperlipidemia, unspecified: Secondary | ICD-10-CM | POA: Diagnosis not present

## 2015-05-12 DIAGNOSIS — E039 Hypothyroidism, unspecified: Secondary | ICD-10-CM | POA: Diagnosis not present

## 2015-05-12 DIAGNOSIS — M6281 Muscle weakness (generalized): Secondary | ICD-10-CM | POA: Diagnosis not present

## 2015-05-12 DIAGNOSIS — I129 Hypertensive chronic kidney disease with stage 1 through stage 4 chronic kidney disease, or unspecified chronic kidney disease: Secondary | ICD-10-CM | POA: Diagnosis not present

## 2015-05-12 DIAGNOSIS — E785 Hyperlipidemia, unspecified: Secondary | ICD-10-CM | POA: Diagnosis not present

## 2015-05-12 DIAGNOSIS — T814XXS Infection following a procedure, sequela: Secondary | ICD-10-CM | POA: Diagnosis not present

## 2015-05-12 DIAGNOSIS — Z7982 Long term (current) use of aspirin: Secondary | ICD-10-CM | POA: Diagnosis not present

## 2015-05-12 DIAGNOSIS — E1122 Type 2 diabetes mellitus with diabetic chronic kidney disease: Secondary | ICD-10-CM | POA: Diagnosis not present

## 2015-05-12 DIAGNOSIS — I6529 Occlusion and stenosis of unspecified carotid artery: Secondary | ICD-10-CM | POA: Diagnosis not present

## 2015-05-12 DIAGNOSIS — E119 Type 2 diabetes mellitus without complications: Secondary | ICD-10-CM | POA: Diagnosis not present

## 2015-05-12 DIAGNOSIS — N189 Chronic kidney disease, unspecified: Secondary | ICD-10-CM | POA: Diagnosis not present

## 2015-05-12 DIAGNOSIS — Z933 Colostomy status: Secondary | ICD-10-CM | POA: Diagnosis not present

## 2015-05-12 DIAGNOSIS — S31109A Unspecified open wound of abdominal wall, unspecified quadrant without penetration into peritoneal cavity, initial encounter: Secondary | ICD-10-CM | POA: Diagnosis not present

## 2015-05-12 DIAGNOSIS — I10 Essential (primary) hypertension: Secondary | ICD-10-CM | POA: Diagnosis not present

## 2015-05-12 DIAGNOSIS — Z7984 Long term (current) use of oral hypoglycemic drugs: Secondary | ICD-10-CM | POA: Diagnosis not present

## 2015-05-12 DIAGNOSIS — R001 Bradycardia, unspecified: Secondary | ICD-10-CM | POA: Diagnosis not present

## 2015-05-12 DIAGNOSIS — Z87891 Personal history of nicotine dependence: Secondary | ICD-10-CM | POA: Diagnosis not present

## 2015-05-12 DIAGNOSIS — Z87440 Personal history of urinary (tract) infections: Secondary | ICD-10-CM | POA: Diagnosis not present

## 2015-05-12 DIAGNOSIS — H409 Unspecified glaucoma: Secondary | ICD-10-CM | POA: Diagnosis not present

## 2015-05-12 DIAGNOSIS — L02211 Cutaneous abscess of abdominal wall: Secondary | ICD-10-CM | POA: Diagnosis not present

## 2015-05-13 DIAGNOSIS — H409 Unspecified glaucoma: Secondary | ICD-10-CM | POA: Diagnosis not present

## 2015-05-13 DIAGNOSIS — M6281 Muscle weakness (generalized): Secondary | ICD-10-CM | POA: Diagnosis not present

## 2015-05-13 DIAGNOSIS — I10 Essential (primary) hypertension: Secondary | ICD-10-CM | POA: Diagnosis not present

## 2015-05-13 DIAGNOSIS — E785 Hyperlipidemia, unspecified: Secondary | ICD-10-CM | POA: Diagnosis not present

## 2015-05-13 DIAGNOSIS — E119 Type 2 diabetes mellitus without complications: Secondary | ICD-10-CM | POA: Diagnosis not present

## 2015-05-13 DIAGNOSIS — L02211 Cutaneous abscess of abdominal wall: Secondary | ICD-10-CM | POA: Diagnosis not present

## 2015-05-14 DIAGNOSIS — H409 Unspecified glaucoma: Secondary | ICD-10-CM | POA: Diagnosis not present

## 2015-05-14 DIAGNOSIS — L02211 Cutaneous abscess of abdominal wall: Secondary | ICD-10-CM | POA: Diagnosis not present

## 2015-05-14 DIAGNOSIS — E785 Hyperlipidemia, unspecified: Secondary | ICD-10-CM | POA: Diagnosis not present

## 2015-05-14 DIAGNOSIS — I10 Essential (primary) hypertension: Secondary | ICD-10-CM | POA: Diagnosis not present

## 2015-05-14 DIAGNOSIS — M6281 Muscle weakness (generalized): Secondary | ICD-10-CM | POA: Diagnosis not present

## 2015-05-14 DIAGNOSIS — E119 Type 2 diabetes mellitus without complications: Secondary | ICD-10-CM | POA: Diagnosis not present

## 2015-05-15 DIAGNOSIS — I10 Essential (primary) hypertension: Secondary | ICD-10-CM | POA: Diagnosis not present

## 2015-05-15 DIAGNOSIS — E119 Type 2 diabetes mellitus without complications: Secondary | ICD-10-CM | POA: Diagnosis not present

## 2015-05-15 DIAGNOSIS — M6281 Muscle weakness (generalized): Secondary | ICD-10-CM | POA: Diagnosis not present

## 2015-05-15 DIAGNOSIS — E785 Hyperlipidemia, unspecified: Secondary | ICD-10-CM | POA: Diagnosis not present

## 2015-05-15 DIAGNOSIS — H409 Unspecified glaucoma: Secondary | ICD-10-CM | POA: Diagnosis not present

## 2015-05-15 DIAGNOSIS — L02211 Cutaneous abscess of abdominal wall: Secondary | ICD-10-CM | POA: Diagnosis not present

## 2015-05-16 DIAGNOSIS — I10 Essential (primary) hypertension: Secondary | ICD-10-CM | POA: Diagnosis not present

## 2015-05-16 DIAGNOSIS — E119 Type 2 diabetes mellitus without complications: Secondary | ICD-10-CM | POA: Diagnosis not present

## 2015-05-16 DIAGNOSIS — M6281 Muscle weakness (generalized): Secondary | ICD-10-CM | POA: Diagnosis not present

## 2015-05-16 DIAGNOSIS — L02211 Cutaneous abscess of abdominal wall: Secondary | ICD-10-CM | POA: Diagnosis not present

## 2015-05-16 DIAGNOSIS — H409 Unspecified glaucoma: Secondary | ICD-10-CM | POA: Diagnosis not present

## 2015-05-16 DIAGNOSIS — E785 Hyperlipidemia, unspecified: Secondary | ICD-10-CM | POA: Diagnosis not present

## 2015-05-19 DIAGNOSIS — H409 Unspecified glaucoma: Secondary | ICD-10-CM | POA: Diagnosis not present

## 2015-05-19 DIAGNOSIS — E785 Hyperlipidemia, unspecified: Secondary | ICD-10-CM | POA: Diagnosis not present

## 2015-05-19 DIAGNOSIS — L02211 Cutaneous abscess of abdominal wall: Secondary | ICD-10-CM | POA: Diagnosis not present

## 2015-05-19 DIAGNOSIS — M6281 Muscle weakness (generalized): Secondary | ICD-10-CM | POA: Diagnosis not present

## 2015-05-19 DIAGNOSIS — I10 Essential (primary) hypertension: Secondary | ICD-10-CM | POA: Diagnosis not present

## 2015-05-19 DIAGNOSIS — E119 Type 2 diabetes mellitus without complications: Secondary | ICD-10-CM | POA: Diagnosis not present

## 2015-05-20 DIAGNOSIS — H409 Unspecified glaucoma: Secondary | ICD-10-CM | POA: Diagnosis not present

## 2015-05-20 DIAGNOSIS — L02211 Cutaneous abscess of abdominal wall: Secondary | ICD-10-CM | POA: Diagnosis not present

## 2015-05-20 DIAGNOSIS — M6281 Muscle weakness (generalized): Secondary | ICD-10-CM | POA: Diagnosis not present

## 2015-05-20 DIAGNOSIS — E119 Type 2 diabetes mellitus without complications: Secondary | ICD-10-CM | POA: Diagnosis not present

## 2015-05-20 DIAGNOSIS — E785 Hyperlipidemia, unspecified: Secondary | ICD-10-CM | POA: Diagnosis not present

## 2015-05-20 DIAGNOSIS — I10 Essential (primary) hypertension: Secondary | ICD-10-CM | POA: Diagnosis not present

## 2015-05-21 DIAGNOSIS — I10 Essential (primary) hypertension: Secondary | ICD-10-CM | POA: Diagnosis not present

## 2015-05-21 DIAGNOSIS — E119 Type 2 diabetes mellitus without complications: Secondary | ICD-10-CM | POA: Diagnosis not present

## 2015-05-21 DIAGNOSIS — L02211 Cutaneous abscess of abdominal wall: Secondary | ICD-10-CM | POA: Diagnosis not present

## 2015-05-21 DIAGNOSIS — M6281 Muscle weakness (generalized): Secondary | ICD-10-CM | POA: Diagnosis not present

## 2015-05-21 DIAGNOSIS — E785 Hyperlipidemia, unspecified: Secondary | ICD-10-CM | POA: Diagnosis not present

## 2015-05-21 DIAGNOSIS — H409 Unspecified glaucoma: Secondary | ICD-10-CM | POA: Diagnosis not present

## 2015-05-23 DIAGNOSIS — E785 Hyperlipidemia, unspecified: Secondary | ICD-10-CM | POA: Diagnosis not present

## 2015-05-23 DIAGNOSIS — E119 Type 2 diabetes mellitus without complications: Secondary | ICD-10-CM | POA: Diagnosis not present

## 2015-05-23 DIAGNOSIS — I10 Essential (primary) hypertension: Secondary | ICD-10-CM | POA: Diagnosis not present

## 2015-05-23 DIAGNOSIS — H409 Unspecified glaucoma: Secondary | ICD-10-CM | POA: Diagnosis not present

## 2015-05-23 DIAGNOSIS — L02211 Cutaneous abscess of abdominal wall: Secondary | ICD-10-CM | POA: Diagnosis not present

## 2015-05-23 DIAGNOSIS — M6281 Muscle weakness (generalized): Secondary | ICD-10-CM | POA: Diagnosis not present

## 2015-05-26 DIAGNOSIS — M6281 Muscle weakness (generalized): Secondary | ICD-10-CM | POA: Diagnosis not present

## 2015-05-26 DIAGNOSIS — H409 Unspecified glaucoma: Secondary | ICD-10-CM | POA: Diagnosis not present

## 2015-05-26 DIAGNOSIS — I10 Essential (primary) hypertension: Secondary | ICD-10-CM | POA: Diagnosis not present

## 2015-05-26 DIAGNOSIS — L02211 Cutaneous abscess of abdominal wall: Secondary | ICD-10-CM | POA: Diagnosis not present

## 2015-05-26 DIAGNOSIS — E785 Hyperlipidemia, unspecified: Secondary | ICD-10-CM | POA: Diagnosis not present

## 2015-05-26 DIAGNOSIS — E119 Type 2 diabetes mellitus without complications: Secondary | ICD-10-CM | POA: Diagnosis not present

## 2015-05-28 DIAGNOSIS — H409 Unspecified glaucoma: Secondary | ICD-10-CM | POA: Diagnosis not present

## 2015-05-28 DIAGNOSIS — L02211 Cutaneous abscess of abdominal wall: Secondary | ICD-10-CM | POA: Diagnosis not present

## 2015-05-28 DIAGNOSIS — M6281 Muscle weakness (generalized): Secondary | ICD-10-CM | POA: Diagnosis not present

## 2015-05-28 DIAGNOSIS — E119 Type 2 diabetes mellitus without complications: Secondary | ICD-10-CM | POA: Diagnosis not present

## 2015-05-28 DIAGNOSIS — I10 Essential (primary) hypertension: Secondary | ICD-10-CM | POA: Diagnosis not present

## 2015-05-28 DIAGNOSIS — E785 Hyperlipidemia, unspecified: Secondary | ICD-10-CM | POA: Diagnosis not present

## 2015-05-30 DIAGNOSIS — M6281 Muscle weakness (generalized): Secondary | ICD-10-CM | POA: Diagnosis not present

## 2015-05-30 DIAGNOSIS — E119 Type 2 diabetes mellitus without complications: Secondary | ICD-10-CM | POA: Diagnosis not present

## 2015-05-30 DIAGNOSIS — H409 Unspecified glaucoma: Secondary | ICD-10-CM | POA: Diagnosis not present

## 2015-05-30 DIAGNOSIS — L02211 Cutaneous abscess of abdominal wall: Secondary | ICD-10-CM | POA: Diagnosis not present

## 2015-05-30 DIAGNOSIS — E785 Hyperlipidemia, unspecified: Secondary | ICD-10-CM | POA: Diagnosis not present

## 2015-05-30 DIAGNOSIS — I10 Essential (primary) hypertension: Secondary | ICD-10-CM | POA: Diagnosis not present

## 2015-05-31 DIAGNOSIS — H409 Unspecified glaucoma: Secondary | ICD-10-CM | POA: Diagnosis not present

## 2015-05-31 DIAGNOSIS — E785 Hyperlipidemia, unspecified: Secondary | ICD-10-CM | POA: Diagnosis not present

## 2015-05-31 DIAGNOSIS — E119 Type 2 diabetes mellitus without complications: Secondary | ICD-10-CM | POA: Diagnosis not present

## 2015-05-31 DIAGNOSIS — M6281 Muscle weakness (generalized): Secondary | ICD-10-CM | POA: Diagnosis not present

## 2015-05-31 DIAGNOSIS — L02211 Cutaneous abscess of abdominal wall: Secondary | ICD-10-CM | POA: Diagnosis not present

## 2015-05-31 DIAGNOSIS — I10 Essential (primary) hypertension: Secondary | ICD-10-CM | POA: Diagnosis not present

## 2015-06-02 DIAGNOSIS — I10 Essential (primary) hypertension: Secondary | ICD-10-CM | POA: Diagnosis not present

## 2015-06-02 DIAGNOSIS — L02211 Cutaneous abscess of abdominal wall: Secondary | ICD-10-CM | POA: Diagnosis not present

## 2015-06-02 DIAGNOSIS — M6281 Muscle weakness (generalized): Secondary | ICD-10-CM | POA: Diagnosis not present

## 2015-06-02 DIAGNOSIS — E119 Type 2 diabetes mellitus without complications: Secondary | ICD-10-CM | POA: Diagnosis not present

## 2015-06-02 DIAGNOSIS — H409 Unspecified glaucoma: Secondary | ICD-10-CM | POA: Diagnosis not present

## 2015-06-02 DIAGNOSIS — E785 Hyperlipidemia, unspecified: Secondary | ICD-10-CM | POA: Diagnosis not present

## 2015-06-04 DIAGNOSIS — M6281 Muscle weakness (generalized): Secondary | ICD-10-CM | POA: Diagnosis not present

## 2015-06-04 DIAGNOSIS — H409 Unspecified glaucoma: Secondary | ICD-10-CM | POA: Diagnosis not present

## 2015-06-04 DIAGNOSIS — I10 Essential (primary) hypertension: Secondary | ICD-10-CM | POA: Diagnosis not present

## 2015-06-04 DIAGNOSIS — E119 Type 2 diabetes mellitus without complications: Secondary | ICD-10-CM | POA: Diagnosis not present

## 2015-06-04 DIAGNOSIS — E785 Hyperlipidemia, unspecified: Secondary | ICD-10-CM | POA: Diagnosis not present

## 2015-06-04 DIAGNOSIS — L02211 Cutaneous abscess of abdominal wall: Secondary | ICD-10-CM | POA: Diagnosis not present

## 2015-06-05 DIAGNOSIS — L02211 Cutaneous abscess of abdominal wall: Secondary | ICD-10-CM | POA: Diagnosis not present

## 2015-06-05 DIAGNOSIS — H409 Unspecified glaucoma: Secondary | ICD-10-CM | POA: Diagnosis not present

## 2015-06-05 DIAGNOSIS — I10 Essential (primary) hypertension: Secondary | ICD-10-CM | POA: Diagnosis not present

## 2015-06-05 DIAGNOSIS — E785 Hyperlipidemia, unspecified: Secondary | ICD-10-CM | POA: Diagnosis not present

## 2015-06-05 DIAGNOSIS — E119 Type 2 diabetes mellitus without complications: Secondary | ICD-10-CM | POA: Diagnosis not present

## 2015-06-05 DIAGNOSIS — M6281 Muscle weakness (generalized): Secondary | ICD-10-CM | POA: Diagnosis not present

## 2015-06-06 DIAGNOSIS — E119 Type 2 diabetes mellitus without complications: Secondary | ICD-10-CM | POA: Diagnosis not present

## 2015-06-06 DIAGNOSIS — I10 Essential (primary) hypertension: Secondary | ICD-10-CM | POA: Diagnosis not present

## 2015-06-06 DIAGNOSIS — E785 Hyperlipidemia, unspecified: Secondary | ICD-10-CM | POA: Diagnosis not present

## 2015-06-06 DIAGNOSIS — H409 Unspecified glaucoma: Secondary | ICD-10-CM | POA: Diagnosis not present

## 2015-06-06 DIAGNOSIS — M6281 Muscle weakness (generalized): Secondary | ICD-10-CM | POA: Diagnosis not present

## 2015-06-06 DIAGNOSIS — L02211 Cutaneous abscess of abdominal wall: Secondary | ICD-10-CM | POA: Diagnosis not present

## 2015-06-07 DIAGNOSIS — H409 Unspecified glaucoma: Secondary | ICD-10-CM | POA: Diagnosis not present

## 2015-06-07 DIAGNOSIS — E785 Hyperlipidemia, unspecified: Secondary | ICD-10-CM | POA: Diagnosis not present

## 2015-06-07 DIAGNOSIS — M6281 Muscle weakness (generalized): Secondary | ICD-10-CM | POA: Diagnosis not present

## 2015-06-07 DIAGNOSIS — E119 Type 2 diabetes mellitus without complications: Secondary | ICD-10-CM | POA: Diagnosis not present

## 2015-06-07 DIAGNOSIS — L02211 Cutaneous abscess of abdominal wall: Secondary | ICD-10-CM | POA: Diagnosis not present

## 2015-06-07 DIAGNOSIS — I10 Essential (primary) hypertension: Secondary | ICD-10-CM | POA: Diagnosis not present

## 2015-06-08 DIAGNOSIS — I10 Essential (primary) hypertension: Secondary | ICD-10-CM | POA: Diagnosis not present

## 2015-06-08 DIAGNOSIS — M6281 Muscle weakness (generalized): Secondary | ICD-10-CM | POA: Diagnosis not present

## 2015-06-08 DIAGNOSIS — E785 Hyperlipidemia, unspecified: Secondary | ICD-10-CM | POA: Diagnosis not present

## 2015-06-08 DIAGNOSIS — E119 Type 2 diabetes mellitus without complications: Secondary | ICD-10-CM | POA: Diagnosis not present

## 2015-06-08 DIAGNOSIS — H409 Unspecified glaucoma: Secondary | ICD-10-CM | POA: Diagnosis not present

## 2015-06-08 DIAGNOSIS — L02211 Cutaneous abscess of abdominal wall: Secondary | ICD-10-CM | POA: Diagnosis not present

## 2015-06-09 DIAGNOSIS — L02211 Cutaneous abscess of abdominal wall: Secondary | ICD-10-CM | POA: Diagnosis not present

## 2015-06-09 DIAGNOSIS — E119 Type 2 diabetes mellitus without complications: Secondary | ICD-10-CM | POA: Diagnosis not present

## 2015-06-09 DIAGNOSIS — M6281 Muscle weakness (generalized): Secondary | ICD-10-CM | POA: Diagnosis not present

## 2015-06-09 DIAGNOSIS — E785 Hyperlipidemia, unspecified: Secondary | ICD-10-CM | POA: Diagnosis not present

## 2015-06-09 DIAGNOSIS — H409 Unspecified glaucoma: Secondary | ICD-10-CM | POA: Diagnosis not present

## 2015-06-09 DIAGNOSIS — E7800 Pure hypercholesterolemia, unspecified: Secondary | ICD-10-CM | POA: Diagnosis not present

## 2015-06-09 DIAGNOSIS — I10 Essential (primary) hypertension: Secondary | ICD-10-CM | POA: Diagnosis not present

## 2015-06-10 DIAGNOSIS — E119 Type 2 diabetes mellitus without complications: Secondary | ICD-10-CM | POA: Diagnosis not present

## 2015-06-10 DIAGNOSIS — L02211 Cutaneous abscess of abdominal wall: Secondary | ICD-10-CM | POA: Diagnosis not present

## 2015-06-10 DIAGNOSIS — M6281 Muscle weakness (generalized): Secondary | ICD-10-CM | POA: Diagnosis not present

## 2015-06-10 DIAGNOSIS — H409 Unspecified glaucoma: Secondary | ICD-10-CM | POA: Diagnosis not present

## 2015-06-10 DIAGNOSIS — I10 Essential (primary) hypertension: Secondary | ICD-10-CM | POA: Diagnosis not present

## 2015-06-10 DIAGNOSIS — E785 Hyperlipidemia, unspecified: Secondary | ICD-10-CM | POA: Diagnosis not present

## 2015-06-11 DIAGNOSIS — E119 Type 2 diabetes mellitus without complications: Secondary | ICD-10-CM | POA: Diagnosis not present

## 2015-06-11 DIAGNOSIS — M6281 Muscle weakness (generalized): Secondary | ICD-10-CM | POA: Diagnosis not present

## 2015-06-11 DIAGNOSIS — I10 Essential (primary) hypertension: Secondary | ICD-10-CM | POA: Diagnosis not present

## 2015-06-11 DIAGNOSIS — E785 Hyperlipidemia, unspecified: Secondary | ICD-10-CM | POA: Diagnosis not present

## 2015-06-11 DIAGNOSIS — L02211 Cutaneous abscess of abdominal wall: Secondary | ICD-10-CM | POA: Diagnosis not present

## 2015-06-11 DIAGNOSIS — H409 Unspecified glaucoma: Secondary | ICD-10-CM | POA: Diagnosis not present

## 2015-06-12 DIAGNOSIS — H409 Unspecified glaucoma: Secondary | ICD-10-CM | POA: Diagnosis not present

## 2015-06-12 DIAGNOSIS — L02211 Cutaneous abscess of abdominal wall: Secondary | ICD-10-CM | POA: Diagnosis not present

## 2015-06-12 DIAGNOSIS — E119 Type 2 diabetes mellitus without complications: Secondary | ICD-10-CM | POA: Diagnosis not present

## 2015-06-12 DIAGNOSIS — R5383 Other fatigue: Secondary | ICD-10-CM | POA: Diagnosis not present

## 2015-06-12 DIAGNOSIS — M6281 Muscle weakness (generalized): Secondary | ICD-10-CM | POA: Diagnosis not present

## 2015-06-12 DIAGNOSIS — E785 Hyperlipidemia, unspecified: Secondary | ICD-10-CM | POA: Diagnosis not present

## 2015-06-12 DIAGNOSIS — I10 Essential (primary) hypertension: Secondary | ICD-10-CM | POA: Diagnosis not present

## 2015-06-12 DIAGNOSIS — T148 Other injury of unspecified body region: Secondary | ICD-10-CM | POA: Diagnosis not present

## 2015-06-13 DIAGNOSIS — H409 Unspecified glaucoma: Secondary | ICD-10-CM | POA: Diagnosis not present

## 2015-06-13 DIAGNOSIS — I10 Essential (primary) hypertension: Secondary | ICD-10-CM | POA: Diagnosis not present

## 2015-06-13 DIAGNOSIS — L02211 Cutaneous abscess of abdominal wall: Secondary | ICD-10-CM | POA: Diagnosis not present

## 2015-06-13 DIAGNOSIS — E785 Hyperlipidemia, unspecified: Secondary | ICD-10-CM | POA: Diagnosis not present

## 2015-06-13 DIAGNOSIS — E119 Type 2 diabetes mellitus without complications: Secondary | ICD-10-CM | POA: Diagnosis not present

## 2015-06-13 DIAGNOSIS — M6281 Muscle weakness (generalized): Secondary | ICD-10-CM | POA: Diagnosis not present

## 2015-06-14 DIAGNOSIS — L02211 Cutaneous abscess of abdominal wall: Secondary | ICD-10-CM | POA: Diagnosis not present

## 2015-06-14 DIAGNOSIS — H409 Unspecified glaucoma: Secondary | ICD-10-CM | POA: Diagnosis not present

## 2015-06-14 DIAGNOSIS — I10 Essential (primary) hypertension: Secondary | ICD-10-CM | POA: Diagnosis not present

## 2015-06-14 DIAGNOSIS — E119 Type 2 diabetes mellitus without complications: Secondary | ICD-10-CM | POA: Diagnosis not present

## 2015-06-14 DIAGNOSIS — M6281 Muscle weakness (generalized): Secondary | ICD-10-CM | POA: Diagnosis not present

## 2015-06-14 DIAGNOSIS — E785 Hyperlipidemia, unspecified: Secondary | ICD-10-CM | POA: Diagnosis not present

## 2015-06-15 DIAGNOSIS — I10 Essential (primary) hypertension: Secondary | ICD-10-CM | POA: Diagnosis not present

## 2015-06-15 DIAGNOSIS — M6281 Muscle weakness (generalized): Secondary | ICD-10-CM | POA: Diagnosis not present

## 2015-06-15 DIAGNOSIS — E785 Hyperlipidemia, unspecified: Secondary | ICD-10-CM | POA: Diagnosis not present

## 2015-06-15 DIAGNOSIS — L02211 Cutaneous abscess of abdominal wall: Secondary | ICD-10-CM | POA: Diagnosis not present

## 2015-06-15 DIAGNOSIS — E119 Type 2 diabetes mellitus without complications: Secondary | ICD-10-CM | POA: Diagnosis not present

## 2015-06-15 DIAGNOSIS — H409 Unspecified glaucoma: Secondary | ICD-10-CM | POA: Diagnosis not present

## 2015-06-16 DIAGNOSIS — M6281 Muscle weakness (generalized): Secondary | ICD-10-CM | POA: Diagnosis not present

## 2015-06-16 DIAGNOSIS — I10 Essential (primary) hypertension: Secondary | ICD-10-CM | POA: Diagnosis not present

## 2015-06-16 DIAGNOSIS — E785 Hyperlipidemia, unspecified: Secondary | ICD-10-CM | POA: Diagnosis not present

## 2015-06-16 DIAGNOSIS — L02211 Cutaneous abscess of abdominal wall: Secondary | ICD-10-CM | POA: Diagnosis not present

## 2015-06-16 DIAGNOSIS — H409 Unspecified glaucoma: Secondary | ICD-10-CM | POA: Diagnosis not present

## 2015-06-16 DIAGNOSIS — E119 Type 2 diabetes mellitus without complications: Secondary | ICD-10-CM | POA: Diagnosis not present

## 2015-06-17 DIAGNOSIS — H409 Unspecified glaucoma: Secondary | ICD-10-CM | POA: Diagnosis not present

## 2015-06-17 DIAGNOSIS — L02211 Cutaneous abscess of abdominal wall: Secondary | ICD-10-CM | POA: Diagnosis not present

## 2015-06-17 DIAGNOSIS — E785 Hyperlipidemia, unspecified: Secondary | ICD-10-CM | POA: Diagnosis not present

## 2015-06-17 DIAGNOSIS — I10 Essential (primary) hypertension: Secondary | ICD-10-CM | POA: Diagnosis not present

## 2015-06-17 DIAGNOSIS — M6281 Muscle weakness (generalized): Secondary | ICD-10-CM | POA: Diagnosis not present

## 2015-06-17 DIAGNOSIS — E119 Type 2 diabetes mellitus without complications: Secondary | ICD-10-CM | POA: Diagnosis not present

## 2015-06-19 DIAGNOSIS — H409 Unspecified glaucoma: Secondary | ICD-10-CM | POA: Diagnosis not present

## 2015-06-19 DIAGNOSIS — M6281 Muscle weakness (generalized): Secondary | ICD-10-CM | POA: Diagnosis not present

## 2015-06-19 DIAGNOSIS — I10 Essential (primary) hypertension: Secondary | ICD-10-CM | POA: Diagnosis not present

## 2015-06-19 DIAGNOSIS — E119 Type 2 diabetes mellitus without complications: Secondary | ICD-10-CM | POA: Diagnosis not present

## 2015-06-19 DIAGNOSIS — E785 Hyperlipidemia, unspecified: Secondary | ICD-10-CM | POA: Diagnosis not present

## 2015-06-19 DIAGNOSIS — L02211 Cutaneous abscess of abdominal wall: Secondary | ICD-10-CM | POA: Diagnosis not present

## 2015-06-20 DIAGNOSIS — L02211 Cutaneous abscess of abdominal wall: Secondary | ICD-10-CM | POA: Diagnosis not present

## 2015-06-20 DIAGNOSIS — M6281 Muscle weakness (generalized): Secondary | ICD-10-CM | POA: Diagnosis not present

## 2015-06-20 DIAGNOSIS — H409 Unspecified glaucoma: Secondary | ICD-10-CM | POA: Diagnosis not present

## 2015-06-20 DIAGNOSIS — I10 Essential (primary) hypertension: Secondary | ICD-10-CM | POA: Diagnosis not present

## 2015-06-20 DIAGNOSIS — E119 Type 2 diabetes mellitus without complications: Secondary | ICD-10-CM | POA: Diagnosis not present

## 2015-06-20 DIAGNOSIS — E785 Hyperlipidemia, unspecified: Secondary | ICD-10-CM | POA: Diagnosis not present

## 2015-06-23 DIAGNOSIS — I10 Essential (primary) hypertension: Secondary | ICD-10-CM | POA: Diagnosis not present

## 2015-06-23 DIAGNOSIS — E119 Type 2 diabetes mellitus without complications: Secondary | ICD-10-CM | POA: Diagnosis not present

## 2015-06-23 DIAGNOSIS — E785 Hyperlipidemia, unspecified: Secondary | ICD-10-CM | POA: Diagnosis not present

## 2015-06-23 DIAGNOSIS — M6281 Muscle weakness (generalized): Secondary | ICD-10-CM | POA: Diagnosis not present

## 2015-06-23 DIAGNOSIS — H409 Unspecified glaucoma: Secondary | ICD-10-CM | POA: Diagnosis not present

## 2015-06-23 DIAGNOSIS — L02211 Cutaneous abscess of abdominal wall: Secondary | ICD-10-CM | POA: Diagnosis not present

## 2015-06-25 DIAGNOSIS — E119 Type 2 diabetes mellitus without complications: Secondary | ICD-10-CM | POA: Diagnosis not present

## 2015-06-25 DIAGNOSIS — H409 Unspecified glaucoma: Secondary | ICD-10-CM | POA: Diagnosis not present

## 2015-06-25 DIAGNOSIS — E785 Hyperlipidemia, unspecified: Secondary | ICD-10-CM | POA: Diagnosis not present

## 2015-06-25 DIAGNOSIS — L02211 Cutaneous abscess of abdominal wall: Secondary | ICD-10-CM | POA: Diagnosis not present

## 2015-06-25 DIAGNOSIS — M6281 Muscle weakness (generalized): Secondary | ICD-10-CM | POA: Diagnosis not present

## 2015-06-25 DIAGNOSIS — I10 Essential (primary) hypertension: Secondary | ICD-10-CM | POA: Diagnosis not present

## 2015-06-27 DIAGNOSIS — I10 Essential (primary) hypertension: Secondary | ICD-10-CM | POA: Diagnosis not present

## 2015-06-27 DIAGNOSIS — L02211 Cutaneous abscess of abdominal wall: Secondary | ICD-10-CM | POA: Diagnosis not present

## 2015-06-27 DIAGNOSIS — E119 Type 2 diabetes mellitus without complications: Secondary | ICD-10-CM | POA: Diagnosis not present

## 2015-06-27 DIAGNOSIS — H409 Unspecified glaucoma: Secondary | ICD-10-CM | POA: Diagnosis not present

## 2015-06-27 DIAGNOSIS — M6281 Muscle weakness (generalized): Secondary | ICD-10-CM | POA: Diagnosis not present

## 2015-06-27 DIAGNOSIS — E785 Hyperlipidemia, unspecified: Secondary | ICD-10-CM | POA: Diagnosis not present

## 2015-06-28 DIAGNOSIS — H409 Unspecified glaucoma: Secondary | ICD-10-CM | POA: Diagnosis not present

## 2015-06-28 DIAGNOSIS — L02211 Cutaneous abscess of abdominal wall: Secondary | ICD-10-CM | POA: Diagnosis not present

## 2015-06-28 DIAGNOSIS — M6281 Muscle weakness (generalized): Secondary | ICD-10-CM | POA: Diagnosis not present

## 2015-06-28 DIAGNOSIS — I10 Essential (primary) hypertension: Secondary | ICD-10-CM | POA: Diagnosis not present

## 2015-06-28 DIAGNOSIS — E785 Hyperlipidemia, unspecified: Secondary | ICD-10-CM | POA: Diagnosis not present

## 2015-06-28 DIAGNOSIS — E119 Type 2 diabetes mellitus without complications: Secondary | ICD-10-CM | POA: Diagnosis not present

## 2015-06-30 DIAGNOSIS — L02211 Cutaneous abscess of abdominal wall: Secondary | ICD-10-CM | POA: Diagnosis not present

## 2015-06-30 DIAGNOSIS — M6281 Muscle weakness (generalized): Secondary | ICD-10-CM | POA: Diagnosis not present

## 2015-06-30 DIAGNOSIS — H409 Unspecified glaucoma: Secondary | ICD-10-CM | POA: Diagnosis not present

## 2015-06-30 DIAGNOSIS — E785 Hyperlipidemia, unspecified: Secondary | ICD-10-CM | POA: Diagnosis not present

## 2015-06-30 DIAGNOSIS — E119 Type 2 diabetes mellitus without complications: Secondary | ICD-10-CM | POA: Diagnosis not present

## 2015-06-30 DIAGNOSIS — I10 Essential (primary) hypertension: Secondary | ICD-10-CM | POA: Diagnosis not present

## 2015-07-01 DIAGNOSIS — I10 Essential (primary) hypertension: Secondary | ICD-10-CM | POA: Diagnosis not present

## 2015-07-01 DIAGNOSIS — E785 Hyperlipidemia, unspecified: Secondary | ICD-10-CM | POA: Diagnosis not present

## 2015-07-01 DIAGNOSIS — M6281 Muscle weakness (generalized): Secondary | ICD-10-CM | POA: Diagnosis not present

## 2015-07-01 DIAGNOSIS — H409 Unspecified glaucoma: Secondary | ICD-10-CM | POA: Diagnosis not present

## 2015-07-01 DIAGNOSIS — L02211 Cutaneous abscess of abdominal wall: Secondary | ICD-10-CM | POA: Diagnosis not present

## 2015-07-01 DIAGNOSIS — E119 Type 2 diabetes mellitus without complications: Secondary | ICD-10-CM | POA: Diagnosis not present

## 2015-07-02 DIAGNOSIS — I10 Essential (primary) hypertension: Secondary | ICD-10-CM | POA: Diagnosis not present

## 2015-07-02 DIAGNOSIS — H409 Unspecified glaucoma: Secondary | ICD-10-CM | POA: Diagnosis not present

## 2015-07-02 DIAGNOSIS — M6281 Muscle weakness (generalized): Secondary | ICD-10-CM | POA: Diagnosis not present

## 2015-07-02 DIAGNOSIS — E785 Hyperlipidemia, unspecified: Secondary | ICD-10-CM | POA: Diagnosis not present

## 2015-07-02 DIAGNOSIS — L02211 Cutaneous abscess of abdominal wall: Secondary | ICD-10-CM | POA: Diagnosis not present

## 2015-07-02 DIAGNOSIS — E119 Type 2 diabetes mellitus without complications: Secondary | ICD-10-CM | POA: Diagnosis not present

## 2015-07-03 DIAGNOSIS — E119 Type 2 diabetes mellitus without complications: Secondary | ICD-10-CM | POA: Diagnosis not present

## 2015-07-03 DIAGNOSIS — L02211 Cutaneous abscess of abdominal wall: Secondary | ICD-10-CM | POA: Diagnosis not present

## 2015-07-03 DIAGNOSIS — H409 Unspecified glaucoma: Secondary | ICD-10-CM | POA: Diagnosis not present

## 2015-07-03 DIAGNOSIS — E785 Hyperlipidemia, unspecified: Secondary | ICD-10-CM | POA: Diagnosis not present

## 2015-07-03 DIAGNOSIS — M6281 Muscle weakness (generalized): Secondary | ICD-10-CM | POA: Diagnosis not present

## 2015-07-03 DIAGNOSIS — I10 Essential (primary) hypertension: Secondary | ICD-10-CM | POA: Diagnosis not present

## 2015-07-07 DIAGNOSIS — L02211 Cutaneous abscess of abdominal wall: Secondary | ICD-10-CM | POA: Diagnosis not present

## 2015-07-07 DIAGNOSIS — E119 Type 2 diabetes mellitus without complications: Secondary | ICD-10-CM | POA: Diagnosis not present

## 2015-07-07 DIAGNOSIS — M6281 Muscle weakness (generalized): Secondary | ICD-10-CM | POA: Diagnosis not present

## 2015-07-07 DIAGNOSIS — H409 Unspecified glaucoma: Secondary | ICD-10-CM | POA: Diagnosis not present

## 2015-07-07 DIAGNOSIS — I10 Essential (primary) hypertension: Secondary | ICD-10-CM | POA: Diagnosis not present

## 2015-07-07 DIAGNOSIS — E785 Hyperlipidemia, unspecified: Secondary | ICD-10-CM | POA: Diagnosis not present

## 2015-07-08 DIAGNOSIS — E785 Hyperlipidemia, unspecified: Secondary | ICD-10-CM | POA: Diagnosis not present

## 2015-07-08 DIAGNOSIS — E119 Type 2 diabetes mellitus without complications: Secondary | ICD-10-CM | POA: Diagnosis not present

## 2015-07-08 DIAGNOSIS — M6281 Muscle weakness (generalized): Secondary | ICD-10-CM | POA: Diagnosis not present

## 2015-07-08 DIAGNOSIS — L02211 Cutaneous abscess of abdominal wall: Secondary | ICD-10-CM | POA: Diagnosis not present

## 2015-07-08 DIAGNOSIS — I10 Essential (primary) hypertension: Secondary | ICD-10-CM | POA: Diagnosis not present

## 2015-07-08 DIAGNOSIS — H409 Unspecified glaucoma: Secondary | ICD-10-CM | POA: Diagnosis not present

## 2015-07-10 DIAGNOSIS — H409 Unspecified glaucoma: Secondary | ICD-10-CM | POA: Diagnosis not present

## 2015-07-10 DIAGNOSIS — E119 Type 2 diabetes mellitus without complications: Secondary | ICD-10-CM | POA: Diagnosis not present

## 2015-07-10 DIAGNOSIS — E785 Hyperlipidemia, unspecified: Secondary | ICD-10-CM | POA: Diagnosis not present

## 2015-07-10 DIAGNOSIS — I10 Essential (primary) hypertension: Secondary | ICD-10-CM | POA: Diagnosis not present

## 2015-07-10 DIAGNOSIS — L02211 Cutaneous abscess of abdominal wall: Secondary | ICD-10-CM | POA: Diagnosis not present

## 2015-07-10 DIAGNOSIS — M6281 Muscle weakness (generalized): Secondary | ICD-10-CM | POA: Diagnosis not present

## 2015-07-21 DIAGNOSIS — H401132 Primary open-angle glaucoma, bilateral, moderate stage: Secondary | ICD-10-CM | POA: Diagnosis not present

## 2015-07-21 DIAGNOSIS — Z961 Presence of intraocular lens: Secondary | ICD-10-CM | POA: Diagnosis not present

## 2015-07-21 DIAGNOSIS — E119 Type 2 diabetes mellitus without complications: Secondary | ICD-10-CM | POA: Diagnosis not present

## 2015-07-22 DIAGNOSIS — Z719 Counseling, unspecified: Secondary | ICD-10-CM | POA: Diagnosis not present

## 2015-07-22 DIAGNOSIS — L02211 Cutaneous abscess of abdominal wall: Secondary | ICD-10-CM | POA: Diagnosis not present

## 2015-07-22 DIAGNOSIS — Z139 Encounter for screening, unspecified: Secondary | ICD-10-CM | POA: Diagnosis not present

## 2015-08-12 DIAGNOSIS — E039 Hypothyroidism, unspecified: Secondary | ICD-10-CM | POA: Diagnosis not present

## 2015-08-12 DIAGNOSIS — Z720 Tobacco use: Secondary | ICD-10-CM | POA: Diagnosis not present

## 2015-08-12 DIAGNOSIS — E785 Hyperlipidemia, unspecified: Secondary | ICD-10-CM | POA: Diagnosis not present

## 2015-08-12 DIAGNOSIS — I10 Essential (primary) hypertension: Secondary | ICD-10-CM | POA: Diagnosis not present

## 2015-08-12 DIAGNOSIS — L02211 Cutaneous abscess of abdominal wall: Secondary | ICD-10-CM | POA: Diagnosis not present

## 2015-08-12 DIAGNOSIS — Z7982 Long term (current) use of aspirin: Secondary | ICD-10-CM | POA: Diagnosis not present

## 2015-08-12 DIAGNOSIS — Z9181 History of falling: Secondary | ICD-10-CM | POA: Diagnosis not present

## 2015-08-12 DIAGNOSIS — R001 Bradycardia, unspecified: Secondary | ICD-10-CM | POA: Diagnosis not present

## 2015-08-12 DIAGNOSIS — E119 Type 2 diabetes mellitus without complications: Secondary | ICD-10-CM | POA: Diagnosis not present

## 2015-08-12 DIAGNOSIS — Z87440 Personal history of urinary (tract) infections: Secondary | ICD-10-CM | POA: Diagnosis not present

## 2015-08-12 DIAGNOSIS — H409 Unspecified glaucoma: Secondary | ICD-10-CM | POA: Diagnosis not present

## 2015-08-12 DIAGNOSIS — Z7984 Long term (current) use of oral hypoglycemic drugs: Secondary | ICD-10-CM | POA: Diagnosis not present

## 2015-08-13 DIAGNOSIS — Z719 Counseling, unspecified: Secondary | ICD-10-CM | POA: Diagnosis not present

## 2015-08-13 DIAGNOSIS — L02211 Cutaneous abscess of abdominal wall: Secondary | ICD-10-CM | POA: Diagnosis not present

## 2015-08-15 DIAGNOSIS — R001 Bradycardia, unspecified: Secondary | ICD-10-CM | POA: Diagnosis not present

## 2015-08-15 DIAGNOSIS — E119 Type 2 diabetes mellitus without complications: Secondary | ICD-10-CM | POA: Diagnosis not present

## 2015-08-15 DIAGNOSIS — E785 Hyperlipidemia, unspecified: Secondary | ICD-10-CM | POA: Diagnosis not present

## 2015-08-15 DIAGNOSIS — I10 Essential (primary) hypertension: Secondary | ICD-10-CM | POA: Diagnosis not present

## 2015-08-15 DIAGNOSIS — L02211 Cutaneous abscess of abdominal wall: Secondary | ICD-10-CM | POA: Diagnosis not present

## 2015-08-15 DIAGNOSIS — H409 Unspecified glaucoma: Secondary | ICD-10-CM | POA: Diagnosis not present

## 2015-08-18 DIAGNOSIS — E785 Hyperlipidemia, unspecified: Secondary | ICD-10-CM | POA: Diagnosis not present

## 2015-08-18 DIAGNOSIS — I10 Essential (primary) hypertension: Secondary | ICD-10-CM | POA: Diagnosis not present

## 2015-08-18 DIAGNOSIS — R001 Bradycardia, unspecified: Secondary | ICD-10-CM | POA: Diagnosis not present

## 2015-08-18 DIAGNOSIS — E119 Type 2 diabetes mellitus without complications: Secondary | ICD-10-CM | POA: Diagnosis not present

## 2015-08-18 DIAGNOSIS — L02211 Cutaneous abscess of abdominal wall: Secondary | ICD-10-CM | POA: Diagnosis not present

## 2015-08-18 DIAGNOSIS — H409 Unspecified glaucoma: Secondary | ICD-10-CM | POA: Diagnosis not present

## 2015-08-20 DIAGNOSIS — L02211 Cutaneous abscess of abdominal wall: Secondary | ICD-10-CM | POA: Diagnosis not present

## 2015-08-20 DIAGNOSIS — E119 Type 2 diabetes mellitus without complications: Secondary | ICD-10-CM | POA: Diagnosis not present

## 2015-08-20 DIAGNOSIS — H409 Unspecified glaucoma: Secondary | ICD-10-CM | POA: Diagnosis not present

## 2015-08-20 DIAGNOSIS — E785 Hyperlipidemia, unspecified: Secondary | ICD-10-CM | POA: Diagnosis not present

## 2015-08-20 DIAGNOSIS — R001 Bradycardia, unspecified: Secondary | ICD-10-CM | POA: Diagnosis not present

## 2015-08-20 DIAGNOSIS — I10 Essential (primary) hypertension: Secondary | ICD-10-CM | POA: Diagnosis not present

## 2015-08-22 DIAGNOSIS — R001 Bradycardia, unspecified: Secondary | ICD-10-CM | POA: Diagnosis not present

## 2015-08-22 DIAGNOSIS — E119 Type 2 diabetes mellitus without complications: Secondary | ICD-10-CM | POA: Diagnosis not present

## 2015-08-22 DIAGNOSIS — H409 Unspecified glaucoma: Secondary | ICD-10-CM | POA: Diagnosis not present

## 2015-08-22 DIAGNOSIS — L02211 Cutaneous abscess of abdominal wall: Secondary | ICD-10-CM | POA: Diagnosis not present

## 2015-08-22 DIAGNOSIS — E785 Hyperlipidemia, unspecified: Secondary | ICD-10-CM | POA: Diagnosis not present

## 2015-08-22 DIAGNOSIS — I10 Essential (primary) hypertension: Secondary | ICD-10-CM | POA: Diagnosis not present

## 2015-08-25 DIAGNOSIS — R001 Bradycardia, unspecified: Secondary | ICD-10-CM | POA: Diagnosis not present

## 2015-08-25 DIAGNOSIS — H409 Unspecified glaucoma: Secondary | ICD-10-CM | POA: Diagnosis not present

## 2015-08-25 DIAGNOSIS — E119 Type 2 diabetes mellitus without complications: Secondary | ICD-10-CM | POA: Diagnosis not present

## 2015-08-25 DIAGNOSIS — I10 Essential (primary) hypertension: Secondary | ICD-10-CM | POA: Diagnosis not present

## 2015-08-25 DIAGNOSIS — L02211 Cutaneous abscess of abdominal wall: Secondary | ICD-10-CM | POA: Diagnosis not present

## 2015-08-25 DIAGNOSIS — E785 Hyperlipidemia, unspecified: Secondary | ICD-10-CM | POA: Diagnosis not present

## 2015-08-26 DIAGNOSIS — Z139 Encounter for screening, unspecified: Secondary | ICD-10-CM | POA: Diagnosis not present

## 2015-08-26 DIAGNOSIS — Z719 Counseling, unspecified: Secondary | ICD-10-CM | POA: Diagnosis not present

## 2015-08-26 DIAGNOSIS — L02211 Cutaneous abscess of abdominal wall: Secondary | ICD-10-CM | POA: Diagnosis not present

## 2015-08-27 DIAGNOSIS — E119 Type 2 diabetes mellitus without complications: Secondary | ICD-10-CM | POA: Diagnosis not present

## 2015-08-27 DIAGNOSIS — E785 Hyperlipidemia, unspecified: Secondary | ICD-10-CM | POA: Diagnosis not present

## 2015-08-27 DIAGNOSIS — I10 Essential (primary) hypertension: Secondary | ICD-10-CM | POA: Diagnosis not present

## 2015-08-27 DIAGNOSIS — H409 Unspecified glaucoma: Secondary | ICD-10-CM | POA: Diagnosis not present

## 2015-08-27 DIAGNOSIS — R001 Bradycardia, unspecified: Secondary | ICD-10-CM | POA: Diagnosis not present

## 2015-08-27 DIAGNOSIS — L02211 Cutaneous abscess of abdominal wall: Secondary | ICD-10-CM | POA: Diagnosis not present

## 2015-08-29 DIAGNOSIS — I10 Essential (primary) hypertension: Secondary | ICD-10-CM | POA: Diagnosis not present

## 2015-08-29 DIAGNOSIS — H409 Unspecified glaucoma: Secondary | ICD-10-CM | POA: Diagnosis not present

## 2015-08-29 DIAGNOSIS — R001 Bradycardia, unspecified: Secondary | ICD-10-CM | POA: Diagnosis not present

## 2015-08-29 DIAGNOSIS — E785 Hyperlipidemia, unspecified: Secondary | ICD-10-CM | POA: Diagnosis not present

## 2015-08-29 DIAGNOSIS — E119 Type 2 diabetes mellitus without complications: Secondary | ICD-10-CM | POA: Diagnosis not present

## 2015-08-29 DIAGNOSIS — L02211 Cutaneous abscess of abdominal wall: Secondary | ICD-10-CM | POA: Diagnosis not present

## 2015-08-30 DIAGNOSIS — M7052 Other bursitis of knee, left knee: Secondary | ICD-10-CM | POA: Diagnosis not present

## 2015-09-01 DIAGNOSIS — I10 Essential (primary) hypertension: Secondary | ICD-10-CM | POA: Diagnosis not present

## 2015-09-01 DIAGNOSIS — H409 Unspecified glaucoma: Secondary | ICD-10-CM | POA: Diagnosis not present

## 2015-09-01 DIAGNOSIS — R001 Bradycardia, unspecified: Secondary | ICD-10-CM | POA: Diagnosis not present

## 2015-09-01 DIAGNOSIS — L02211 Cutaneous abscess of abdominal wall: Secondary | ICD-10-CM | POA: Diagnosis not present

## 2015-09-01 DIAGNOSIS — E119 Type 2 diabetes mellitus without complications: Secondary | ICD-10-CM | POA: Diagnosis not present

## 2015-09-01 DIAGNOSIS — E785 Hyperlipidemia, unspecified: Secondary | ICD-10-CM | POA: Diagnosis not present

## 2015-09-03 DIAGNOSIS — L02211 Cutaneous abscess of abdominal wall: Secondary | ICD-10-CM | POA: Diagnosis not present

## 2015-09-03 DIAGNOSIS — E119 Type 2 diabetes mellitus without complications: Secondary | ICD-10-CM | POA: Diagnosis not present

## 2015-09-03 DIAGNOSIS — R001 Bradycardia, unspecified: Secondary | ICD-10-CM | POA: Diagnosis not present

## 2015-09-03 DIAGNOSIS — I10 Essential (primary) hypertension: Secondary | ICD-10-CM | POA: Diagnosis not present

## 2015-09-03 DIAGNOSIS — E785 Hyperlipidemia, unspecified: Secondary | ICD-10-CM | POA: Diagnosis not present

## 2015-09-03 DIAGNOSIS — H409 Unspecified glaucoma: Secondary | ICD-10-CM | POA: Diagnosis not present

## 2015-09-04 DIAGNOSIS — L089 Local infection of the skin and subcutaneous tissue, unspecified: Secondary | ICD-10-CM | POA: Diagnosis not present

## 2015-09-04 DIAGNOSIS — S31109A Unspecified open wound of abdominal wall, unspecified quadrant without penetration into peritoneal cavity, initial encounter: Secondary | ICD-10-CM | POA: Diagnosis not present

## 2015-09-04 DIAGNOSIS — Z719 Counseling, unspecified: Secondary | ICD-10-CM | POA: Diagnosis not present

## 2015-09-05 DIAGNOSIS — I10 Essential (primary) hypertension: Secondary | ICD-10-CM | POA: Diagnosis not present

## 2015-09-05 DIAGNOSIS — E785 Hyperlipidemia, unspecified: Secondary | ICD-10-CM | POA: Diagnosis not present

## 2015-09-05 DIAGNOSIS — E119 Type 2 diabetes mellitus without complications: Secondary | ICD-10-CM | POA: Diagnosis not present

## 2015-09-05 DIAGNOSIS — L02211 Cutaneous abscess of abdominal wall: Secondary | ICD-10-CM | POA: Diagnosis not present

## 2015-09-05 DIAGNOSIS — H409 Unspecified glaucoma: Secondary | ICD-10-CM | POA: Diagnosis not present

## 2015-09-05 DIAGNOSIS — R188 Other ascites: Secondary | ICD-10-CM | POA: Diagnosis not present

## 2015-09-05 DIAGNOSIS — K439 Ventral hernia without obstruction or gangrene: Secondary | ICD-10-CM | POA: Diagnosis not present

## 2015-09-05 DIAGNOSIS — R001 Bradycardia, unspecified: Secondary | ICD-10-CM | POA: Diagnosis not present

## 2015-09-08 DIAGNOSIS — I10 Essential (primary) hypertension: Secondary | ICD-10-CM | POA: Diagnosis not present

## 2015-09-08 DIAGNOSIS — E119 Type 2 diabetes mellitus without complications: Secondary | ICD-10-CM | POA: Diagnosis not present

## 2015-09-08 DIAGNOSIS — R001 Bradycardia, unspecified: Secondary | ICD-10-CM | POA: Diagnosis not present

## 2015-09-08 DIAGNOSIS — L02211 Cutaneous abscess of abdominal wall: Secondary | ICD-10-CM | POA: Diagnosis not present

## 2015-09-08 DIAGNOSIS — E785 Hyperlipidemia, unspecified: Secondary | ICD-10-CM | POA: Diagnosis not present

## 2015-09-08 DIAGNOSIS — H409 Unspecified glaucoma: Secondary | ICD-10-CM | POA: Diagnosis not present

## 2015-09-10 DIAGNOSIS — Z719 Counseling, unspecified: Secondary | ICD-10-CM | POA: Diagnosis not present

## 2015-09-10 DIAGNOSIS — L089 Local infection of the skin and subcutaneous tissue, unspecified: Secondary | ICD-10-CM | POA: Diagnosis not present

## 2015-09-10 DIAGNOSIS — I130 Hypertensive heart and chronic kidney disease with heart failure and stage 1 through stage 4 chronic kidney disease, or unspecified chronic kidney disease: Secondary | ICD-10-CM | POA: Diagnosis not present

## 2015-09-10 DIAGNOSIS — S31109A Unspecified open wound of abdominal wall, unspecified quadrant without penetration into peritoneal cavity, initial encounter: Secondary | ICD-10-CM | POA: Diagnosis not present

## 2015-09-10 DIAGNOSIS — E785 Hyperlipidemia, unspecified: Secondary | ICD-10-CM | POA: Diagnosis not present

## 2015-09-10 DIAGNOSIS — L02211 Cutaneous abscess of abdominal wall: Secondary | ICD-10-CM | POA: Diagnosis not present

## 2015-09-10 DIAGNOSIS — I10 Essential (primary) hypertension: Secondary | ICD-10-CM | POA: Diagnosis not present

## 2015-09-10 DIAGNOSIS — E119 Type 2 diabetes mellitus without complications: Secondary | ICD-10-CM | POA: Diagnosis not present

## 2015-09-10 DIAGNOSIS — R001 Bradycardia, unspecified: Secondary | ICD-10-CM | POA: Diagnosis not present

## 2015-09-10 DIAGNOSIS — Z139 Encounter for screening, unspecified: Secondary | ICD-10-CM | POA: Diagnosis not present

## 2015-09-10 DIAGNOSIS — H409 Unspecified glaucoma: Secondary | ICD-10-CM | POA: Diagnosis not present

## 2015-09-10 DIAGNOSIS — E7800 Pure hypercholesterolemia, unspecified: Secondary | ICD-10-CM | POA: Diagnosis not present

## 2015-09-11 DIAGNOSIS — H40003 Preglaucoma, unspecified, bilateral: Secondary | ICD-10-CM | POA: Diagnosis not present

## 2015-09-11 DIAGNOSIS — H219 Unspecified disorder of iris and ciliary body: Secondary | ICD-10-CM | POA: Diagnosis not present

## 2015-09-11 DIAGNOSIS — Z961 Presence of intraocular lens: Secondary | ICD-10-CM | POA: Diagnosis not present

## 2015-09-12 DIAGNOSIS — H409 Unspecified glaucoma: Secondary | ICD-10-CM | POA: Diagnosis not present

## 2015-09-12 DIAGNOSIS — E119 Type 2 diabetes mellitus without complications: Secondary | ICD-10-CM | POA: Diagnosis not present

## 2015-09-12 DIAGNOSIS — R001 Bradycardia, unspecified: Secondary | ICD-10-CM | POA: Diagnosis not present

## 2015-09-12 DIAGNOSIS — L02211 Cutaneous abscess of abdominal wall: Secondary | ICD-10-CM | POA: Diagnosis not present

## 2015-09-12 DIAGNOSIS — E785 Hyperlipidemia, unspecified: Secondary | ICD-10-CM | POA: Diagnosis not present

## 2015-09-12 DIAGNOSIS — I10 Essential (primary) hypertension: Secondary | ICD-10-CM | POA: Diagnosis not present

## 2015-09-13 DIAGNOSIS — I10 Essential (primary) hypertension: Secondary | ICD-10-CM | POA: Diagnosis not present

## 2015-09-13 DIAGNOSIS — E119 Type 2 diabetes mellitus without complications: Secondary | ICD-10-CM | POA: Diagnosis not present

## 2015-09-13 DIAGNOSIS — L02211 Cutaneous abscess of abdominal wall: Secondary | ICD-10-CM | POA: Diagnosis not present

## 2015-09-13 DIAGNOSIS — H409 Unspecified glaucoma: Secondary | ICD-10-CM | POA: Diagnosis not present

## 2015-09-13 DIAGNOSIS — E785 Hyperlipidemia, unspecified: Secondary | ICD-10-CM | POA: Diagnosis not present

## 2015-09-13 DIAGNOSIS — R001 Bradycardia, unspecified: Secondary | ICD-10-CM | POA: Diagnosis not present

## 2015-09-14 DIAGNOSIS — H409 Unspecified glaucoma: Secondary | ICD-10-CM | POA: Diagnosis not present

## 2015-09-14 DIAGNOSIS — I10 Essential (primary) hypertension: Secondary | ICD-10-CM | POA: Diagnosis not present

## 2015-09-14 DIAGNOSIS — R001 Bradycardia, unspecified: Secondary | ICD-10-CM | POA: Diagnosis not present

## 2015-09-14 DIAGNOSIS — E785 Hyperlipidemia, unspecified: Secondary | ICD-10-CM | POA: Diagnosis not present

## 2015-09-14 DIAGNOSIS — E119 Type 2 diabetes mellitus without complications: Secondary | ICD-10-CM | POA: Diagnosis not present

## 2015-09-14 DIAGNOSIS — L02211 Cutaneous abscess of abdominal wall: Secondary | ICD-10-CM | POA: Diagnosis not present

## 2015-09-15 DIAGNOSIS — I10 Essential (primary) hypertension: Secondary | ICD-10-CM | POA: Diagnosis not present

## 2015-09-15 DIAGNOSIS — L02211 Cutaneous abscess of abdominal wall: Secondary | ICD-10-CM | POA: Diagnosis not present

## 2015-09-15 DIAGNOSIS — E785 Hyperlipidemia, unspecified: Secondary | ICD-10-CM | POA: Diagnosis not present

## 2015-09-15 DIAGNOSIS — H409 Unspecified glaucoma: Secondary | ICD-10-CM | POA: Diagnosis not present

## 2015-09-15 DIAGNOSIS — R001 Bradycardia, unspecified: Secondary | ICD-10-CM | POA: Diagnosis not present

## 2015-09-15 DIAGNOSIS — E119 Type 2 diabetes mellitus without complications: Secondary | ICD-10-CM | POA: Diagnosis not present

## 2015-09-16 DIAGNOSIS — I10 Essential (primary) hypertension: Secondary | ICD-10-CM | POA: Diagnosis not present

## 2015-09-16 DIAGNOSIS — E785 Hyperlipidemia, unspecified: Secondary | ICD-10-CM | POA: Diagnosis not present

## 2015-09-16 DIAGNOSIS — H409 Unspecified glaucoma: Secondary | ICD-10-CM | POA: Diagnosis not present

## 2015-09-16 DIAGNOSIS — L02211 Cutaneous abscess of abdominal wall: Secondary | ICD-10-CM | POA: Diagnosis not present

## 2015-09-16 DIAGNOSIS — R001 Bradycardia, unspecified: Secondary | ICD-10-CM | POA: Diagnosis not present

## 2015-09-16 DIAGNOSIS — E119 Type 2 diabetes mellitus without complications: Secondary | ICD-10-CM | POA: Diagnosis not present

## 2015-09-17 DIAGNOSIS — I10 Essential (primary) hypertension: Secondary | ICD-10-CM | POA: Diagnosis not present

## 2015-09-17 DIAGNOSIS — H409 Unspecified glaucoma: Secondary | ICD-10-CM | POA: Diagnosis not present

## 2015-09-17 DIAGNOSIS — L02211 Cutaneous abscess of abdominal wall: Secondary | ICD-10-CM | POA: Diagnosis not present

## 2015-09-17 DIAGNOSIS — E119 Type 2 diabetes mellitus without complications: Secondary | ICD-10-CM | POA: Diagnosis not present

## 2015-09-17 DIAGNOSIS — R001 Bradycardia, unspecified: Secondary | ICD-10-CM | POA: Diagnosis not present

## 2015-09-17 DIAGNOSIS — E785 Hyperlipidemia, unspecified: Secondary | ICD-10-CM | POA: Diagnosis not present

## 2015-09-18 DIAGNOSIS — E119 Type 2 diabetes mellitus without complications: Secondary | ICD-10-CM | POA: Diagnosis not present

## 2015-09-18 DIAGNOSIS — E785 Hyperlipidemia, unspecified: Secondary | ICD-10-CM | POA: Diagnosis not present

## 2015-09-18 DIAGNOSIS — I10 Essential (primary) hypertension: Secondary | ICD-10-CM | POA: Diagnosis not present

## 2015-09-18 DIAGNOSIS — L02211 Cutaneous abscess of abdominal wall: Secondary | ICD-10-CM | POA: Diagnosis not present

## 2015-09-18 DIAGNOSIS — H409 Unspecified glaucoma: Secondary | ICD-10-CM | POA: Diagnosis not present

## 2015-09-18 DIAGNOSIS — R001 Bradycardia, unspecified: Secondary | ICD-10-CM | POA: Diagnosis not present

## 2015-09-19 DIAGNOSIS — E119 Type 2 diabetes mellitus without complications: Secondary | ICD-10-CM | POA: Diagnosis not present

## 2015-09-19 DIAGNOSIS — R001 Bradycardia, unspecified: Secondary | ICD-10-CM | POA: Diagnosis not present

## 2015-09-19 DIAGNOSIS — I10 Essential (primary) hypertension: Secondary | ICD-10-CM | POA: Diagnosis not present

## 2015-09-19 DIAGNOSIS — L02211 Cutaneous abscess of abdominal wall: Secondary | ICD-10-CM | POA: Diagnosis not present

## 2015-09-19 DIAGNOSIS — E785 Hyperlipidemia, unspecified: Secondary | ICD-10-CM | POA: Diagnosis not present

## 2015-09-19 DIAGNOSIS — H409 Unspecified glaucoma: Secondary | ICD-10-CM | POA: Diagnosis not present

## 2015-09-20 DIAGNOSIS — H409 Unspecified glaucoma: Secondary | ICD-10-CM | POA: Diagnosis not present

## 2015-09-20 DIAGNOSIS — L02211 Cutaneous abscess of abdominal wall: Secondary | ICD-10-CM | POA: Diagnosis not present

## 2015-09-20 DIAGNOSIS — E785 Hyperlipidemia, unspecified: Secondary | ICD-10-CM | POA: Diagnosis not present

## 2015-09-20 DIAGNOSIS — R001 Bradycardia, unspecified: Secondary | ICD-10-CM | POA: Diagnosis not present

## 2015-09-20 DIAGNOSIS — E119 Type 2 diabetes mellitus without complications: Secondary | ICD-10-CM | POA: Diagnosis not present

## 2015-09-20 DIAGNOSIS — I10 Essential (primary) hypertension: Secondary | ICD-10-CM | POA: Diagnosis not present

## 2015-09-21 DIAGNOSIS — I10 Essential (primary) hypertension: Secondary | ICD-10-CM | POA: Diagnosis not present

## 2015-09-21 DIAGNOSIS — E785 Hyperlipidemia, unspecified: Secondary | ICD-10-CM | POA: Diagnosis not present

## 2015-09-21 DIAGNOSIS — L02211 Cutaneous abscess of abdominal wall: Secondary | ICD-10-CM | POA: Diagnosis not present

## 2015-09-21 DIAGNOSIS — H409 Unspecified glaucoma: Secondary | ICD-10-CM | POA: Diagnosis not present

## 2015-09-21 DIAGNOSIS — R001 Bradycardia, unspecified: Secondary | ICD-10-CM | POA: Diagnosis not present

## 2015-09-21 DIAGNOSIS — E119 Type 2 diabetes mellitus without complications: Secondary | ICD-10-CM | POA: Diagnosis not present

## 2015-09-22 DIAGNOSIS — L02211 Cutaneous abscess of abdominal wall: Secondary | ICD-10-CM | POA: Diagnosis not present

## 2015-09-22 DIAGNOSIS — H409 Unspecified glaucoma: Secondary | ICD-10-CM | POA: Diagnosis not present

## 2015-09-22 DIAGNOSIS — E785 Hyperlipidemia, unspecified: Secondary | ICD-10-CM | POA: Diagnosis not present

## 2015-09-22 DIAGNOSIS — I10 Essential (primary) hypertension: Secondary | ICD-10-CM | POA: Diagnosis not present

## 2015-09-22 DIAGNOSIS — R001 Bradycardia, unspecified: Secondary | ICD-10-CM | POA: Diagnosis not present

## 2015-09-22 DIAGNOSIS — E119 Type 2 diabetes mellitus without complications: Secondary | ICD-10-CM | POA: Diagnosis not present

## 2015-09-23 DIAGNOSIS — S31109A Unspecified open wound of abdominal wall, unspecified quadrant without penetration into peritoneal cavity, initial encounter: Secondary | ICD-10-CM | POA: Diagnosis not present

## 2015-09-23 DIAGNOSIS — L089 Local infection of the skin and subcutaneous tissue, unspecified: Secondary | ICD-10-CM | POA: Diagnosis not present

## 2015-09-23 DIAGNOSIS — Z139 Encounter for screening, unspecified: Secondary | ICD-10-CM | POA: Diagnosis not present

## 2015-09-23 DIAGNOSIS — Z719 Counseling, unspecified: Secondary | ICD-10-CM | POA: Diagnosis not present

## 2015-09-24 DIAGNOSIS — I10 Essential (primary) hypertension: Secondary | ICD-10-CM | POA: Diagnosis not present

## 2015-09-24 DIAGNOSIS — L02211 Cutaneous abscess of abdominal wall: Secondary | ICD-10-CM | POA: Diagnosis not present

## 2015-09-24 DIAGNOSIS — E785 Hyperlipidemia, unspecified: Secondary | ICD-10-CM | POA: Diagnosis not present

## 2015-09-24 DIAGNOSIS — R001 Bradycardia, unspecified: Secondary | ICD-10-CM | POA: Diagnosis not present

## 2015-09-24 DIAGNOSIS — E119 Type 2 diabetes mellitus without complications: Secondary | ICD-10-CM | POA: Diagnosis not present

## 2015-09-24 DIAGNOSIS — H409 Unspecified glaucoma: Secondary | ICD-10-CM | POA: Diagnosis not present

## 2015-09-25 DIAGNOSIS — I10 Essential (primary) hypertension: Secondary | ICD-10-CM | POA: Diagnosis not present

## 2015-09-25 DIAGNOSIS — M810 Age-related osteoporosis without current pathological fracture: Secondary | ICD-10-CM | POA: Diagnosis not present

## 2015-09-25 DIAGNOSIS — H409 Unspecified glaucoma: Secondary | ICD-10-CM | POA: Diagnosis not present

## 2015-09-25 DIAGNOSIS — L02211 Cutaneous abscess of abdominal wall: Secondary | ICD-10-CM | POA: Diagnosis not present

## 2015-09-25 DIAGNOSIS — E119 Type 2 diabetes mellitus without complications: Secondary | ICD-10-CM | POA: Diagnosis not present

## 2015-09-25 DIAGNOSIS — E785 Hyperlipidemia, unspecified: Secondary | ICD-10-CM | POA: Diagnosis not present

## 2015-09-25 DIAGNOSIS — R001 Bradycardia, unspecified: Secondary | ICD-10-CM | POA: Diagnosis not present

## 2015-09-26 DIAGNOSIS — E785 Hyperlipidemia, unspecified: Secondary | ICD-10-CM | POA: Diagnosis not present

## 2015-09-26 DIAGNOSIS — I10 Essential (primary) hypertension: Secondary | ICD-10-CM | POA: Diagnosis not present

## 2015-09-26 DIAGNOSIS — H409 Unspecified glaucoma: Secondary | ICD-10-CM | POA: Diagnosis not present

## 2015-09-26 DIAGNOSIS — E119 Type 2 diabetes mellitus without complications: Secondary | ICD-10-CM | POA: Diagnosis not present

## 2015-09-26 DIAGNOSIS — R001 Bradycardia, unspecified: Secondary | ICD-10-CM | POA: Diagnosis not present

## 2015-09-26 DIAGNOSIS — L02211 Cutaneous abscess of abdominal wall: Secondary | ICD-10-CM | POA: Diagnosis not present

## 2015-09-27 DIAGNOSIS — E785 Hyperlipidemia, unspecified: Secondary | ICD-10-CM | POA: Diagnosis not present

## 2015-09-27 DIAGNOSIS — I10 Essential (primary) hypertension: Secondary | ICD-10-CM | POA: Diagnosis not present

## 2015-09-27 DIAGNOSIS — H409 Unspecified glaucoma: Secondary | ICD-10-CM | POA: Diagnosis not present

## 2015-09-27 DIAGNOSIS — R001 Bradycardia, unspecified: Secondary | ICD-10-CM | POA: Diagnosis not present

## 2015-09-27 DIAGNOSIS — L02211 Cutaneous abscess of abdominal wall: Secondary | ICD-10-CM | POA: Diagnosis not present

## 2015-09-27 DIAGNOSIS — E119 Type 2 diabetes mellitus without complications: Secondary | ICD-10-CM | POA: Diagnosis not present

## 2015-09-28 DIAGNOSIS — E119 Type 2 diabetes mellitus without complications: Secondary | ICD-10-CM | POA: Diagnosis not present

## 2015-09-28 DIAGNOSIS — E785 Hyperlipidemia, unspecified: Secondary | ICD-10-CM | POA: Diagnosis not present

## 2015-09-28 DIAGNOSIS — H409 Unspecified glaucoma: Secondary | ICD-10-CM | POA: Diagnosis not present

## 2015-09-28 DIAGNOSIS — I10 Essential (primary) hypertension: Secondary | ICD-10-CM | POA: Diagnosis not present

## 2015-09-28 DIAGNOSIS — R001 Bradycardia, unspecified: Secondary | ICD-10-CM | POA: Diagnosis not present

## 2015-09-28 DIAGNOSIS — L02211 Cutaneous abscess of abdominal wall: Secondary | ICD-10-CM | POA: Diagnosis not present

## 2015-09-29 DIAGNOSIS — L02211 Cutaneous abscess of abdominal wall: Secondary | ICD-10-CM | POA: Diagnosis not present

## 2015-09-29 DIAGNOSIS — I10 Essential (primary) hypertension: Secondary | ICD-10-CM | POA: Diagnosis not present

## 2015-09-29 DIAGNOSIS — E785 Hyperlipidemia, unspecified: Secondary | ICD-10-CM | POA: Diagnosis not present

## 2015-09-29 DIAGNOSIS — E119 Type 2 diabetes mellitus without complications: Secondary | ICD-10-CM | POA: Diagnosis not present

## 2015-09-29 DIAGNOSIS — H409 Unspecified glaucoma: Secondary | ICD-10-CM | POA: Diagnosis not present

## 2015-09-29 DIAGNOSIS — R001 Bradycardia, unspecified: Secondary | ICD-10-CM | POA: Diagnosis not present

## 2015-09-30 DIAGNOSIS — I129 Hypertensive chronic kidney disease with stage 1 through stage 4 chronic kidney disease, or unspecified chronic kidney disease: Secondary | ICD-10-CM | POA: Diagnosis not present

## 2015-09-30 DIAGNOSIS — K651 Peritoneal abscess: Secondary | ICD-10-CM | POA: Diagnosis not present

## 2015-09-30 DIAGNOSIS — Z719 Counseling, unspecified: Secondary | ICD-10-CM | POA: Diagnosis not present

## 2015-09-30 DIAGNOSIS — N183 Chronic kidney disease, stage 3 (moderate): Secondary | ICD-10-CM | POA: Diagnosis not present

## 2015-09-30 DIAGNOSIS — E1122 Type 2 diabetes mellitus with diabetic chronic kidney disease: Secondary | ICD-10-CM | POA: Diagnosis not present

## 2015-09-30 DIAGNOSIS — F329 Major depressive disorder, single episode, unspecified: Secondary | ICD-10-CM | POA: Diagnosis not present

## 2015-09-30 DIAGNOSIS — L02211 Cutaneous abscess of abdominal wall: Secondary | ICD-10-CM | POA: Diagnosis not present

## 2015-09-30 DIAGNOSIS — I639 Cerebral infarction, unspecified: Secondary | ICD-10-CM | POA: Diagnosis not present

## 2015-09-30 DIAGNOSIS — E7800 Pure hypercholesterolemia, unspecified: Secondary | ICD-10-CM | POA: Diagnosis not present

## 2015-10-01 DIAGNOSIS — R001 Bradycardia, unspecified: Secondary | ICD-10-CM | POA: Diagnosis not present

## 2015-10-01 DIAGNOSIS — H409 Unspecified glaucoma: Secondary | ICD-10-CM | POA: Diagnosis not present

## 2015-10-01 DIAGNOSIS — E119 Type 2 diabetes mellitus without complications: Secondary | ICD-10-CM | POA: Diagnosis not present

## 2015-10-01 DIAGNOSIS — L02211 Cutaneous abscess of abdominal wall: Secondary | ICD-10-CM | POA: Diagnosis not present

## 2015-10-01 DIAGNOSIS — E785 Hyperlipidemia, unspecified: Secondary | ICD-10-CM | POA: Diagnosis not present

## 2015-10-01 DIAGNOSIS — I10 Essential (primary) hypertension: Secondary | ICD-10-CM | POA: Diagnosis not present

## 2015-10-02 DIAGNOSIS — E785 Hyperlipidemia, unspecified: Secondary | ICD-10-CM | POA: Diagnosis not present

## 2015-10-02 DIAGNOSIS — R001 Bradycardia, unspecified: Secondary | ICD-10-CM | POA: Diagnosis not present

## 2015-10-02 DIAGNOSIS — I10 Essential (primary) hypertension: Secondary | ICD-10-CM | POA: Diagnosis not present

## 2015-10-02 DIAGNOSIS — L02211 Cutaneous abscess of abdominal wall: Secondary | ICD-10-CM | POA: Diagnosis not present

## 2015-10-02 DIAGNOSIS — H409 Unspecified glaucoma: Secondary | ICD-10-CM | POA: Diagnosis not present

## 2015-10-02 DIAGNOSIS — E119 Type 2 diabetes mellitus without complications: Secondary | ICD-10-CM | POA: Diagnosis not present

## 2015-10-03 DIAGNOSIS — I10 Essential (primary) hypertension: Secondary | ICD-10-CM | POA: Diagnosis not present

## 2015-10-03 DIAGNOSIS — E785 Hyperlipidemia, unspecified: Secondary | ICD-10-CM | POA: Diagnosis not present

## 2015-10-03 DIAGNOSIS — L02211 Cutaneous abscess of abdominal wall: Secondary | ICD-10-CM | POA: Diagnosis not present

## 2015-10-03 DIAGNOSIS — H409 Unspecified glaucoma: Secondary | ICD-10-CM | POA: Diagnosis not present

## 2015-10-03 DIAGNOSIS — E119 Type 2 diabetes mellitus without complications: Secondary | ICD-10-CM | POA: Diagnosis not present

## 2015-10-03 DIAGNOSIS — R001 Bradycardia, unspecified: Secondary | ICD-10-CM | POA: Diagnosis not present

## 2015-10-04 DIAGNOSIS — H409 Unspecified glaucoma: Secondary | ICD-10-CM | POA: Diagnosis not present

## 2015-10-04 DIAGNOSIS — R001 Bradycardia, unspecified: Secondary | ICD-10-CM | POA: Diagnosis not present

## 2015-10-04 DIAGNOSIS — I10 Essential (primary) hypertension: Secondary | ICD-10-CM | POA: Diagnosis not present

## 2015-10-04 DIAGNOSIS — E119 Type 2 diabetes mellitus without complications: Secondary | ICD-10-CM | POA: Diagnosis not present

## 2015-10-04 DIAGNOSIS — L02211 Cutaneous abscess of abdominal wall: Secondary | ICD-10-CM | POA: Diagnosis not present

## 2015-10-04 DIAGNOSIS — E785 Hyperlipidemia, unspecified: Secondary | ICD-10-CM | POA: Diagnosis not present

## 2015-10-05 DIAGNOSIS — I10 Essential (primary) hypertension: Secondary | ICD-10-CM | POA: Diagnosis not present

## 2015-10-05 DIAGNOSIS — R001 Bradycardia, unspecified: Secondary | ICD-10-CM | POA: Diagnosis not present

## 2015-10-05 DIAGNOSIS — L02211 Cutaneous abscess of abdominal wall: Secondary | ICD-10-CM | POA: Diagnosis not present

## 2015-10-05 DIAGNOSIS — E119 Type 2 diabetes mellitus without complications: Secondary | ICD-10-CM | POA: Diagnosis not present

## 2015-10-05 DIAGNOSIS — H409 Unspecified glaucoma: Secondary | ICD-10-CM | POA: Diagnosis not present

## 2015-10-05 DIAGNOSIS — E785 Hyperlipidemia, unspecified: Secondary | ICD-10-CM | POA: Diagnosis not present

## 2015-10-06 DIAGNOSIS — E119 Type 2 diabetes mellitus without complications: Secondary | ICD-10-CM | POA: Diagnosis not present

## 2015-10-06 DIAGNOSIS — E785 Hyperlipidemia, unspecified: Secondary | ICD-10-CM | POA: Diagnosis not present

## 2015-10-06 DIAGNOSIS — L02211 Cutaneous abscess of abdominal wall: Secondary | ICD-10-CM | POA: Diagnosis not present

## 2015-10-06 DIAGNOSIS — R001 Bradycardia, unspecified: Secondary | ICD-10-CM | POA: Diagnosis not present

## 2015-10-06 DIAGNOSIS — H409 Unspecified glaucoma: Secondary | ICD-10-CM | POA: Diagnosis not present

## 2015-10-06 DIAGNOSIS — I10 Essential (primary) hypertension: Secondary | ICD-10-CM | POA: Diagnosis not present

## 2015-10-07 DIAGNOSIS — L02211 Cutaneous abscess of abdominal wall: Secondary | ICD-10-CM | POA: Diagnosis not present

## 2015-10-07 DIAGNOSIS — Z7982 Long term (current) use of aspirin: Secondary | ICD-10-CM | POA: Diagnosis not present

## 2015-10-07 DIAGNOSIS — B9620 Unspecified Escherichia coli [E. coli] as the cause of diseases classified elsewhere: Secondary | ICD-10-CM | POA: Diagnosis not present

## 2015-10-07 DIAGNOSIS — Z7984 Long term (current) use of oral hypoglycemic drugs: Secondary | ICD-10-CM | POA: Diagnosis not present

## 2015-10-07 DIAGNOSIS — E663 Overweight: Secondary | ICD-10-CM | POA: Diagnosis not present

## 2015-10-07 DIAGNOSIS — E1165 Type 2 diabetes mellitus with hyperglycemia: Secondary | ICD-10-CM | POA: Diagnosis not present

## 2015-10-07 DIAGNOSIS — D649 Anemia, unspecified: Secondary | ICD-10-CM | POA: Diagnosis not present

## 2015-10-07 DIAGNOSIS — T8189XA Other complications of procedures, not elsewhere classified, initial encounter: Secondary | ICD-10-CM | POA: Diagnosis not present

## 2015-10-07 DIAGNOSIS — H409 Unspecified glaucoma: Secondary | ICD-10-CM | POA: Diagnosis not present

## 2015-10-07 DIAGNOSIS — L409 Psoriasis, unspecified: Secondary | ICD-10-CM | POA: Diagnosis present

## 2015-10-07 DIAGNOSIS — M810 Age-related osteoporosis without current pathological fracture: Secondary | ICD-10-CM | POA: Diagnosis present

## 2015-10-07 DIAGNOSIS — Z91048 Other nonmedicinal substance allergy status: Secondary | ICD-10-CM | POA: Diagnosis not present

## 2015-10-07 DIAGNOSIS — Z8673 Personal history of transient ischemic attack (TIA), and cerebral infarction without residual deficits: Secondary | ICD-10-CM | POA: Diagnosis not present

## 2015-10-07 DIAGNOSIS — T8131XA Disruption of external operation (surgical) wound, not elsewhere classified, initial encounter: Secondary | ICD-10-CM | POA: Diagnosis not present

## 2015-10-07 DIAGNOSIS — Z888 Allergy status to other drugs, medicaments and biological substances status: Secondary | ICD-10-CM | POA: Diagnosis not present

## 2015-10-07 DIAGNOSIS — B951 Streptococcus, group B, as the cause of diseases classified elsewhere: Secondary | ICD-10-CM | POA: Diagnosis not present

## 2015-10-07 DIAGNOSIS — N281 Cyst of kidney, acquired: Secondary | ICD-10-CM | POA: Diagnosis not present

## 2015-10-07 DIAGNOSIS — K5900 Constipation, unspecified: Secondary | ICD-10-CM | POA: Diagnosis present

## 2015-10-07 DIAGNOSIS — K632 Fistula of intestine: Secondary | ICD-10-CM | POA: Diagnosis not present

## 2015-10-07 DIAGNOSIS — I6529 Occlusion and stenosis of unspecified carotid artery: Secondary | ICD-10-CM | POA: Diagnosis not present

## 2015-10-07 DIAGNOSIS — K651 Peritoneal abscess: Secondary | ICD-10-CM | POA: Diagnosis not present

## 2015-10-07 DIAGNOSIS — Y838 Other surgical procedures as the cause of abnormal reaction of the patient, or of later complication, without mention of misadventure at the time of the procedure: Secondary | ICD-10-CM | POA: Diagnosis not present

## 2015-10-07 DIAGNOSIS — I10 Essential (primary) hypertension: Secondary | ICD-10-CM | POA: Diagnosis not present

## 2015-10-07 DIAGNOSIS — T814XXA Infection following a procedure, initial encounter: Secondary | ICD-10-CM | POA: Diagnosis not present

## 2015-10-07 DIAGNOSIS — R1032 Left lower quadrant pain: Secondary | ICD-10-CM | POA: Diagnosis not present

## 2015-10-07 DIAGNOSIS — Z8249 Family history of ischemic heart disease and other diseases of the circulatory system: Secondary | ICD-10-CM | POA: Diagnosis not present

## 2015-10-07 DIAGNOSIS — R109 Unspecified abdominal pain: Secondary | ICD-10-CM | POA: Diagnosis not present

## 2015-10-07 DIAGNOSIS — R001 Bradycardia, unspecified: Secondary | ICD-10-CM | POA: Diagnosis not present

## 2015-10-07 DIAGNOSIS — K439 Ventral hernia without obstruction or gangrene: Secondary | ICD-10-CM | POA: Diagnosis not present

## 2015-10-07 DIAGNOSIS — E785 Hyperlipidemia, unspecified: Secondary | ICD-10-CM | POA: Diagnosis not present

## 2015-10-07 DIAGNOSIS — Z6827 Body mass index (BMI) 27.0-27.9, adult: Secondary | ICD-10-CM | POA: Diagnosis not present

## 2015-10-07 DIAGNOSIS — Z885 Allergy status to narcotic agent status: Secondary | ICD-10-CM | POA: Diagnosis not present

## 2015-10-07 DIAGNOSIS — E119 Type 2 diabetes mellitus without complications: Secondary | ICD-10-CM | POA: Diagnosis not present

## 2015-10-07 DIAGNOSIS — Z87891 Personal history of nicotine dependence: Secondary | ICD-10-CM | POA: Diagnosis not present

## 2015-10-07 DIAGNOSIS — E039 Hypothyroidism, unspecified: Secondary | ICD-10-CM | POA: Diagnosis not present

## 2015-10-21 DIAGNOSIS — K5780 Diverticulitis of intestine, part unspecified, with perforation and abscess without bleeding: Secondary | ICD-10-CM | POA: Diagnosis not present

## 2015-10-21 DIAGNOSIS — E1140 Type 2 diabetes mellitus with diabetic neuropathy, unspecified: Secondary | ICD-10-CM | POA: Diagnosis not present

## 2015-10-21 DIAGNOSIS — I6529 Occlusion and stenosis of unspecified carotid artery: Secondary | ICD-10-CM | POA: Diagnosis not present

## 2015-10-21 DIAGNOSIS — R531 Weakness: Secondary | ICD-10-CM | POA: Diagnosis not present

## 2015-10-21 DIAGNOSIS — E1165 Type 2 diabetes mellitus with hyperglycemia: Secondary | ICD-10-CM | POA: Diagnosis not present

## 2015-10-21 DIAGNOSIS — R001 Bradycardia, unspecified: Secondary | ICD-10-CM | POA: Diagnosis not present

## 2015-10-21 DIAGNOSIS — R404 Transient alteration of awareness: Secondary | ICD-10-CM | POA: Diagnosis not present

## 2015-10-21 DIAGNOSIS — E871 Hypo-osmolality and hyponatremia: Secondary | ICD-10-CM | POA: Diagnosis not present

## 2015-10-21 DIAGNOSIS — E1122 Type 2 diabetes mellitus with diabetic chronic kidney disease: Secondary | ICD-10-CM | POA: Diagnosis not present

## 2015-10-21 DIAGNOSIS — N179 Acute kidney failure, unspecified: Secondary | ICD-10-CM | POA: Diagnosis not present

## 2015-10-21 DIAGNOSIS — R627 Adult failure to thrive: Secondary | ICD-10-CM | POA: Diagnosis not present

## 2015-10-21 DIAGNOSIS — K651 Peritoneal abscess: Secondary | ICD-10-CM | POA: Diagnosis not present

## 2015-10-21 DIAGNOSIS — J984 Other disorders of lung: Secondary | ICD-10-CM | POA: Diagnosis not present

## 2015-10-21 DIAGNOSIS — M810 Age-related osteoporosis without current pathological fracture: Secondary | ICD-10-CM | POA: Diagnosis not present

## 2015-10-21 DIAGNOSIS — Z8673 Personal history of transient ischemic attack (TIA), and cerebral infarction without residual deficits: Secondary | ICD-10-CM | POA: Diagnosis not present

## 2015-10-22 DIAGNOSIS — Z91048 Other nonmedicinal substance allergy status: Secondary | ICD-10-CM | POA: Diagnosis not present

## 2015-10-22 DIAGNOSIS — I129 Hypertensive chronic kidney disease with stage 1 through stage 4 chronic kidney disease, or unspecified chronic kidney disease: Secondary | ICD-10-CM | POA: Diagnosis present

## 2015-10-22 DIAGNOSIS — R627 Adult failure to thrive: Secondary | ICD-10-CM | POA: Diagnosis present

## 2015-10-22 DIAGNOSIS — E039 Hypothyroidism, unspecified: Secondary | ICD-10-CM | POA: Diagnosis present

## 2015-10-22 DIAGNOSIS — Z888 Allergy status to other drugs, medicaments and biological substances status: Secondary | ICD-10-CM | POA: Diagnosis not present

## 2015-10-22 DIAGNOSIS — M6281 Muscle weakness (generalized): Secondary | ICD-10-CM | POA: Diagnosis not present

## 2015-10-22 DIAGNOSIS — K5780 Diverticulitis of intestine, part unspecified, with perforation and abscess without bleeding: Secondary | ICD-10-CM | POA: Diagnosis present

## 2015-10-22 DIAGNOSIS — Z7982 Long term (current) use of aspirin: Secondary | ICD-10-CM | POA: Diagnosis not present

## 2015-10-22 DIAGNOSIS — R531 Weakness: Secondary | ICD-10-CM | POA: Diagnosis not present

## 2015-10-22 DIAGNOSIS — Z4889 Encounter for other specified surgical aftercare: Secondary | ICD-10-CM | POA: Diagnosis not present

## 2015-10-22 DIAGNOSIS — K651 Peritoneal abscess: Secondary | ICD-10-CM | POA: Diagnosis not present

## 2015-10-22 DIAGNOSIS — E11649 Type 2 diabetes mellitus with hypoglycemia without coma: Secondary | ICD-10-CM | POA: Diagnosis present

## 2015-10-22 DIAGNOSIS — J984 Other disorders of lung: Secondary | ICD-10-CM | POA: Diagnosis not present

## 2015-10-22 DIAGNOSIS — I6529 Occlusion and stenosis of unspecified carotid artery: Secondary | ICD-10-CM | POA: Diagnosis present

## 2015-10-22 DIAGNOSIS — E1165 Type 2 diabetes mellitus with hyperglycemia: Secondary | ICD-10-CM | POA: Diagnosis present

## 2015-10-22 DIAGNOSIS — E1122 Type 2 diabetes mellitus with diabetic chronic kidney disease: Secondary | ICD-10-CM | POA: Diagnosis present

## 2015-10-22 DIAGNOSIS — Z8673 Personal history of transient ischemic attack (TIA), and cerebral infarction without residual deficits: Secondary | ICD-10-CM | POA: Diagnosis not present

## 2015-10-22 DIAGNOSIS — I10 Essential (primary) hypertension: Secondary | ICD-10-CM | POA: Diagnosis not present

## 2015-10-22 DIAGNOSIS — B349 Viral infection, unspecified: Secondary | ICD-10-CM | POA: Diagnosis present

## 2015-10-22 DIAGNOSIS — R509 Fever, unspecified: Secondary | ICD-10-CM | POA: Diagnosis not present

## 2015-10-22 DIAGNOSIS — L409 Psoriasis, unspecified: Secondary | ICD-10-CM | POA: Diagnosis present

## 2015-10-22 DIAGNOSIS — I4510 Unspecified right bundle-branch block: Secondary | ICD-10-CM | POA: Diagnosis present

## 2015-10-22 DIAGNOSIS — E871 Hypo-osmolality and hyponatremia: Secondary | ICD-10-CM | POA: Diagnosis present

## 2015-10-22 DIAGNOSIS — I6789 Other cerebrovascular disease: Secondary | ICD-10-CM | POA: Diagnosis not present

## 2015-10-22 DIAGNOSIS — M810 Age-related osteoporosis without current pathological fracture: Secondary | ICD-10-CM | POA: Diagnosis present

## 2015-10-22 DIAGNOSIS — M1990 Unspecified osteoarthritis, unspecified site: Secondary | ICD-10-CM | POA: Diagnosis present

## 2015-10-22 DIAGNOSIS — N739 Female pelvic inflammatory disease, unspecified: Secondary | ICD-10-CM | POA: Diagnosis not present

## 2015-10-22 DIAGNOSIS — D649 Anemia, unspecified: Secondary | ICD-10-CM | POA: Diagnosis present

## 2015-10-22 DIAGNOSIS — I679 Cerebrovascular disease, unspecified: Secondary | ICD-10-CM | POA: Diagnosis present

## 2015-10-22 DIAGNOSIS — E1140 Type 2 diabetes mellitus with diabetic neuropathy, unspecified: Secondary | ICD-10-CM | POA: Diagnosis present

## 2015-10-22 DIAGNOSIS — N179 Acute kidney failure, unspecified: Secondary | ICD-10-CM | POA: Diagnosis not present

## 2015-10-22 DIAGNOSIS — N183 Chronic kidney disease, stage 3 (moderate): Secondary | ICD-10-CM | POA: Diagnosis present

## 2015-10-22 DIAGNOSIS — E119 Type 2 diabetes mellitus without complications: Secondary | ICD-10-CM | POA: Diagnosis not present

## 2015-10-22 DIAGNOSIS — R001 Bradycardia, unspecified: Secondary | ICD-10-CM | POA: Diagnosis present

## 2015-10-24 DIAGNOSIS — N281 Cyst of kidney, acquired: Secondary | ICD-10-CM | POA: Diagnosis not present

## 2015-10-24 DIAGNOSIS — G9341 Metabolic encephalopathy: Secondary | ICD-10-CM | POA: Diagnosis not present

## 2015-10-24 DIAGNOSIS — J9811 Atelectasis: Secondary | ICD-10-CM | POA: Diagnosis not present

## 2015-10-24 DIAGNOSIS — I639 Cerebral infarction, unspecified: Secondary | ICD-10-CM | POA: Diagnosis not present

## 2015-10-24 DIAGNOSIS — Z8673 Personal history of transient ischemic attack (TIA), and cerebral infarction without residual deficits: Secondary | ICD-10-CM | POA: Diagnosis not present

## 2015-10-24 DIAGNOSIS — R109 Unspecified abdominal pain: Secondary | ICD-10-CM | POA: Diagnosis not present

## 2015-10-24 DIAGNOSIS — R5381 Other malaise: Secondary | ICD-10-CM | POA: Diagnosis not present

## 2015-10-24 DIAGNOSIS — K651 Peritoneal abscess: Secondary | ICD-10-CM | POA: Diagnosis not present

## 2015-10-24 DIAGNOSIS — E1140 Type 2 diabetes mellitus with diabetic neuropathy, unspecified: Secondary | ICD-10-CM | POA: Diagnosis not present

## 2015-10-24 DIAGNOSIS — N739 Female pelvic inflammatory disease, unspecified: Secondary | ICD-10-CM | POA: Diagnosis not present

## 2015-10-24 DIAGNOSIS — M6281 Muscle weakness (generalized): Secondary | ICD-10-CM | POA: Diagnosis not present

## 2015-10-24 DIAGNOSIS — Z719 Counseling, unspecified: Secondary | ICD-10-CM | POA: Diagnosis not present

## 2015-10-24 DIAGNOSIS — Z7984 Long term (current) use of oral hypoglycemic drugs: Secondary | ICD-10-CM | POA: Diagnosis not present

## 2015-10-24 DIAGNOSIS — R410 Disorientation, unspecified: Secondary | ICD-10-CM | POA: Diagnosis not present

## 2015-10-24 DIAGNOSIS — Z87891 Personal history of nicotine dependence: Secondary | ICD-10-CM | POA: Diagnosis not present

## 2015-10-24 DIAGNOSIS — Z4889 Encounter for other specified surgical aftercare: Secondary | ICD-10-CM | POA: Diagnosis not present

## 2015-10-24 DIAGNOSIS — S31109A Unspecified open wound of abdominal wall, unspecified quadrant without penetration into peritoneal cavity, initial encounter: Secondary | ICD-10-CM | POA: Diagnosis not present

## 2015-10-24 DIAGNOSIS — I6789 Other cerebrovascular disease: Secondary | ICD-10-CM | POA: Diagnosis not present

## 2015-10-24 DIAGNOSIS — Z885 Allergy status to narcotic agent status: Secondary | ICD-10-CM | POA: Diagnosis not present

## 2015-10-24 DIAGNOSIS — E119 Type 2 diabetes mellitus without complications: Secondary | ICD-10-CM | POA: Diagnosis not present

## 2015-10-24 DIAGNOSIS — K469 Unspecified abdominal hernia without obstruction or gangrene: Secondary | ICD-10-CM | POA: Diagnosis not present

## 2015-10-24 DIAGNOSIS — K439 Ventral hernia without obstruction or gangrene: Secondary | ICD-10-CM | POA: Diagnosis not present

## 2015-10-24 DIAGNOSIS — M810 Age-related osteoporosis without current pathological fracture: Secondary | ICD-10-CM | POA: Diagnosis not present

## 2015-10-24 DIAGNOSIS — E039 Hypothyroidism, unspecified: Secondary | ICD-10-CM | POA: Diagnosis not present

## 2015-10-24 DIAGNOSIS — Z7982 Long term (current) use of aspirin: Secondary | ICD-10-CM | POA: Diagnosis not present

## 2015-10-24 DIAGNOSIS — N390 Urinary tract infection, site not specified: Secondary | ICD-10-CM | POA: Diagnosis not present

## 2015-10-24 DIAGNOSIS — I10 Essential (primary) hypertension: Secondary | ICD-10-CM | POA: Diagnosis not present

## 2015-10-24 DIAGNOSIS — R5383 Other fatigue: Secondary | ICD-10-CM | POA: Diagnosis not present

## 2015-10-24 DIAGNOSIS — T888XXA Other specified complications of surgical and medical care, not elsewhere classified, initial encounter: Secondary | ICD-10-CM | POA: Diagnosis not present

## 2015-10-24 DIAGNOSIS — N179 Acute kidney failure, unspecified: Secondary | ICD-10-CM | POA: Diagnosis not present

## 2015-10-24 DIAGNOSIS — R21 Rash and other nonspecific skin eruption: Secondary | ICD-10-CM | POA: Diagnosis not present

## 2015-10-24 DIAGNOSIS — R188 Other ascites: Secondary | ICD-10-CM | POA: Diagnosis not present

## 2015-10-24 DIAGNOSIS — R4182 Altered mental status, unspecified: Secondary | ICD-10-CM | POA: Diagnosis not present

## 2015-10-24 DIAGNOSIS — R0602 Shortness of breath: Secondary | ICD-10-CM | POA: Diagnosis not present

## 2015-10-25 DIAGNOSIS — K651 Peritoneal abscess: Secondary | ICD-10-CM | POA: Diagnosis not present

## 2015-10-25 DIAGNOSIS — E1140 Type 2 diabetes mellitus with diabetic neuropathy, unspecified: Secondary | ICD-10-CM | POA: Diagnosis not present

## 2015-10-25 DIAGNOSIS — R5381 Other malaise: Secondary | ICD-10-CM | POA: Diagnosis not present

## 2015-10-26 DIAGNOSIS — K651 Peritoneal abscess: Secondary | ICD-10-CM | POA: Diagnosis not present

## 2015-10-26 DIAGNOSIS — G9341 Metabolic encephalopathy: Secondary | ICD-10-CM | POA: Diagnosis not present

## 2015-10-26 DIAGNOSIS — R5381 Other malaise: Secondary | ICD-10-CM | POA: Diagnosis not present

## 2015-10-26 DIAGNOSIS — E1140 Type 2 diabetes mellitus with diabetic neuropathy, unspecified: Secondary | ICD-10-CM | POA: Diagnosis not present

## 2015-10-27 DIAGNOSIS — N390 Urinary tract infection, site not specified: Secondary | ICD-10-CM | POA: Diagnosis not present

## 2015-10-27 DIAGNOSIS — R188 Other ascites: Secondary | ICD-10-CM | POA: Diagnosis not present

## 2015-10-27 DIAGNOSIS — Z8673 Personal history of transient ischemic attack (TIA), and cerebral infarction without residual deficits: Secondary | ICD-10-CM | POA: Diagnosis not present

## 2015-10-27 DIAGNOSIS — J9811 Atelectasis: Secondary | ICD-10-CM | POA: Diagnosis not present

## 2015-10-27 DIAGNOSIS — R0602 Shortness of breath: Secondary | ICD-10-CM | POA: Diagnosis not present

## 2015-10-27 DIAGNOSIS — N281 Cyst of kidney, acquired: Secondary | ICD-10-CM | POA: Diagnosis not present

## 2015-10-27 DIAGNOSIS — K651 Peritoneal abscess: Secondary | ICD-10-CM | POA: Diagnosis not present

## 2015-10-27 DIAGNOSIS — R109 Unspecified abdominal pain: Secondary | ICD-10-CM | POA: Diagnosis not present

## 2015-10-27 DIAGNOSIS — E1140 Type 2 diabetes mellitus with diabetic neuropathy, unspecified: Secondary | ICD-10-CM | POA: Diagnosis not present

## 2015-10-27 DIAGNOSIS — R410 Disorientation, unspecified: Secondary | ICD-10-CM | POA: Diagnosis not present

## 2015-10-27 DIAGNOSIS — Z7982 Long term (current) use of aspirin: Secondary | ICD-10-CM | POA: Diagnosis not present

## 2015-10-27 DIAGNOSIS — R5383 Other fatigue: Secondary | ICD-10-CM | POA: Diagnosis not present

## 2015-10-27 DIAGNOSIS — Z7984 Long term (current) use of oral hypoglycemic drugs: Secondary | ICD-10-CM | POA: Diagnosis not present

## 2015-10-27 DIAGNOSIS — Z87891 Personal history of nicotine dependence: Secondary | ICD-10-CM | POA: Diagnosis not present

## 2015-10-27 DIAGNOSIS — I10 Essential (primary) hypertension: Secondary | ICD-10-CM | POA: Diagnosis not present

## 2015-10-27 DIAGNOSIS — R5381 Other malaise: Secondary | ICD-10-CM | POA: Diagnosis not present

## 2015-10-27 DIAGNOSIS — Z885 Allergy status to narcotic agent status: Secondary | ICD-10-CM | POA: Diagnosis not present

## 2015-10-27 DIAGNOSIS — E119 Type 2 diabetes mellitus without complications: Secondary | ICD-10-CM | POA: Diagnosis not present

## 2015-10-30 DIAGNOSIS — R5381 Other malaise: Secondary | ICD-10-CM | POA: Diagnosis not present

## 2015-10-30 DIAGNOSIS — K651 Peritoneal abscess: Secondary | ICD-10-CM | POA: Diagnosis not present

## 2015-10-30 DIAGNOSIS — I10 Essential (primary) hypertension: Secondary | ICD-10-CM | POA: Diagnosis not present

## 2015-10-30 DIAGNOSIS — E1140 Type 2 diabetes mellitus with diabetic neuropathy, unspecified: Secondary | ICD-10-CM | POA: Diagnosis not present

## 2015-11-03 DIAGNOSIS — R5381 Other malaise: Secondary | ICD-10-CM | POA: Diagnosis not present

## 2015-11-03 DIAGNOSIS — I10 Essential (primary) hypertension: Secondary | ICD-10-CM | POA: Diagnosis not present

## 2015-11-03 DIAGNOSIS — E1140 Type 2 diabetes mellitus with diabetic neuropathy, unspecified: Secondary | ICD-10-CM | POA: Diagnosis not present

## 2015-11-03 DIAGNOSIS — K651 Peritoneal abscess: Secondary | ICD-10-CM | POA: Diagnosis not present

## 2015-11-10 DIAGNOSIS — K651 Peritoneal abscess: Secondary | ICD-10-CM | POA: Diagnosis not present

## 2015-11-10 DIAGNOSIS — I639 Cerebral infarction, unspecified: Secondary | ICD-10-CM | POA: Diagnosis not present

## 2015-11-10 DIAGNOSIS — R5381 Other malaise: Secondary | ICD-10-CM | POA: Diagnosis not present

## 2015-11-10 DIAGNOSIS — E1140 Type 2 diabetes mellitus with diabetic neuropathy, unspecified: Secondary | ICD-10-CM | POA: Diagnosis not present

## 2015-11-13 DIAGNOSIS — R21 Rash and other nonspecific skin eruption: Secondary | ICD-10-CM | POA: Diagnosis not present

## 2015-11-13 DIAGNOSIS — E1140 Type 2 diabetes mellitus with diabetic neuropathy, unspecified: Secondary | ICD-10-CM | POA: Diagnosis not present

## 2015-11-18 DIAGNOSIS — K651 Peritoneal abscess: Secondary | ICD-10-CM | POA: Diagnosis not present

## 2015-11-18 DIAGNOSIS — R21 Rash and other nonspecific skin eruption: Secondary | ICD-10-CM | POA: Diagnosis not present

## 2015-11-18 DIAGNOSIS — E1140 Type 2 diabetes mellitus with diabetic neuropathy, unspecified: Secondary | ICD-10-CM | POA: Diagnosis not present

## 2015-11-19 DIAGNOSIS — T888XXA Other specified complications of surgical and medical care, not elsewhere classified, initial encounter: Secondary | ICD-10-CM | POA: Diagnosis not present

## 2015-11-19 DIAGNOSIS — Z719 Counseling, unspecified: Secondary | ICD-10-CM | POA: Diagnosis not present

## 2015-11-20 DIAGNOSIS — R188 Other ascites: Secondary | ICD-10-CM | POA: Diagnosis not present

## 2015-11-20 DIAGNOSIS — S31109A Unspecified open wound of abdominal wall, unspecified quadrant without penetration into peritoneal cavity, initial encounter: Secondary | ICD-10-CM | POA: Diagnosis not present

## 2015-11-20 DIAGNOSIS — K439 Ventral hernia without obstruction or gangrene: Secondary | ICD-10-CM | POA: Diagnosis not present

## 2015-11-20 DIAGNOSIS — K469 Unspecified abdominal hernia without obstruction or gangrene: Secondary | ICD-10-CM | POA: Diagnosis not present

## 2015-11-21 DIAGNOSIS — K651 Peritoneal abscess: Secondary | ICD-10-CM | POA: Diagnosis not present

## 2015-11-21 DIAGNOSIS — R5381 Other malaise: Secondary | ICD-10-CM | POA: Diagnosis not present

## 2015-11-21 DIAGNOSIS — E1140 Type 2 diabetes mellitus with diabetic neuropathy, unspecified: Secondary | ICD-10-CM | POA: Diagnosis not present

## 2015-11-21 DIAGNOSIS — I10 Essential (primary) hypertension: Secondary | ICD-10-CM | POA: Diagnosis not present

## 2015-11-25 DIAGNOSIS — Z719 Counseling, unspecified: Secondary | ICD-10-CM | POA: Diagnosis not present

## 2015-11-25 DIAGNOSIS — S31109A Unspecified open wound of abdominal wall, unspecified quadrant without penetration into peritoneal cavity, initial encounter: Secondary | ICD-10-CM | POA: Diagnosis not present

## 2015-11-25 DIAGNOSIS — Z139 Encounter for screening, unspecified: Secondary | ICD-10-CM | POA: Diagnosis not present

## 2015-11-25 DIAGNOSIS — L089 Local infection of the skin and subcutaneous tissue, unspecified: Secondary | ICD-10-CM | POA: Diagnosis not present

## 2015-11-26 DIAGNOSIS — I2510 Atherosclerotic heart disease of native coronary artery without angina pectoris: Secondary | ICD-10-CM | POA: Diagnosis not present

## 2015-11-26 DIAGNOSIS — E039 Hypothyroidism, unspecified: Secondary | ICD-10-CM | POA: Diagnosis not present

## 2015-11-26 DIAGNOSIS — L409 Psoriasis, unspecified: Secondary | ICD-10-CM | POA: Diagnosis not present

## 2015-11-26 DIAGNOSIS — M1990 Unspecified osteoarthritis, unspecified site: Secondary | ICD-10-CM | POA: Diagnosis not present

## 2015-11-26 DIAGNOSIS — K5780 Diverticulitis of intestine, part unspecified, with perforation and abscess without bleeding: Secondary | ICD-10-CM | POA: Diagnosis not present

## 2015-11-26 DIAGNOSIS — M810 Age-related osteoporosis without current pathological fracture: Secondary | ICD-10-CM | POA: Diagnosis not present

## 2015-11-26 DIAGNOSIS — Z8673 Personal history of transient ischemic attack (TIA), and cerebral infarction without residual deficits: Secondary | ICD-10-CM | POA: Diagnosis not present

## 2015-11-26 DIAGNOSIS — Z48815 Encounter for surgical aftercare following surgery on the digestive system: Secondary | ICD-10-CM | POA: Diagnosis not present

## 2015-11-26 DIAGNOSIS — E1142 Type 2 diabetes mellitus with diabetic polyneuropathy: Secondary | ICD-10-CM | POA: Diagnosis not present

## 2015-11-27 DIAGNOSIS — M810 Age-related osteoporosis without current pathological fracture: Secondary | ICD-10-CM | POA: Diagnosis not present

## 2015-11-27 DIAGNOSIS — E039 Hypothyroidism, unspecified: Secondary | ICD-10-CM | POA: Diagnosis not present

## 2015-11-27 DIAGNOSIS — K5780 Diverticulitis of intestine, part unspecified, with perforation and abscess without bleeding: Secondary | ICD-10-CM | POA: Diagnosis not present

## 2015-11-27 DIAGNOSIS — Z48815 Encounter for surgical aftercare following surgery on the digestive system: Secondary | ICD-10-CM | POA: Diagnosis not present

## 2015-11-27 DIAGNOSIS — I2510 Atherosclerotic heart disease of native coronary artery without angina pectoris: Secondary | ICD-10-CM | POA: Diagnosis not present

## 2015-11-27 DIAGNOSIS — E1142 Type 2 diabetes mellitus with diabetic polyneuropathy: Secondary | ICD-10-CM | POA: Diagnosis not present

## 2015-11-28 DIAGNOSIS — K5780 Diverticulitis of intestine, part unspecified, with perforation and abscess without bleeding: Secondary | ICD-10-CM | POA: Diagnosis not present

## 2015-11-28 DIAGNOSIS — M810 Age-related osteoporosis without current pathological fracture: Secondary | ICD-10-CM | POA: Diagnosis not present

## 2015-11-28 DIAGNOSIS — I2510 Atherosclerotic heart disease of native coronary artery without angina pectoris: Secondary | ICD-10-CM | POA: Diagnosis not present

## 2015-11-28 DIAGNOSIS — Z48815 Encounter for surgical aftercare following surgery on the digestive system: Secondary | ICD-10-CM | POA: Diagnosis not present

## 2015-11-28 DIAGNOSIS — E039 Hypothyroidism, unspecified: Secondary | ICD-10-CM | POA: Diagnosis not present

## 2015-11-28 DIAGNOSIS — E1142 Type 2 diabetes mellitus with diabetic polyneuropathy: Secondary | ICD-10-CM | POA: Diagnosis not present

## 2015-12-01 DIAGNOSIS — M810 Age-related osteoporosis without current pathological fracture: Secondary | ICD-10-CM | POA: Diagnosis not present

## 2015-12-01 DIAGNOSIS — E039 Hypothyroidism, unspecified: Secondary | ICD-10-CM | POA: Diagnosis not present

## 2015-12-01 DIAGNOSIS — K5780 Diverticulitis of intestine, part unspecified, with perforation and abscess without bleeding: Secondary | ICD-10-CM | POA: Diagnosis not present

## 2015-12-01 DIAGNOSIS — I2510 Atherosclerotic heart disease of native coronary artery without angina pectoris: Secondary | ICD-10-CM | POA: Diagnosis not present

## 2015-12-01 DIAGNOSIS — Z48815 Encounter for surgical aftercare following surgery on the digestive system: Secondary | ICD-10-CM | POA: Diagnosis not present

## 2015-12-01 DIAGNOSIS — E1142 Type 2 diabetes mellitus with diabetic polyneuropathy: Secondary | ICD-10-CM | POA: Diagnosis not present

## 2015-12-03 DIAGNOSIS — E039 Hypothyroidism, unspecified: Secondary | ICD-10-CM | POA: Diagnosis not present

## 2015-12-03 DIAGNOSIS — E1142 Type 2 diabetes mellitus with diabetic polyneuropathy: Secondary | ICD-10-CM | POA: Diagnosis not present

## 2015-12-03 DIAGNOSIS — Z48815 Encounter for surgical aftercare following surgery on the digestive system: Secondary | ICD-10-CM | POA: Diagnosis not present

## 2015-12-03 DIAGNOSIS — M810 Age-related osteoporosis without current pathological fracture: Secondary | ICD-10-CM | POA: Diagnosis not present

## 2015-12-03 DIAGNOSIS — K5780 Diverticulitis of intestine, part unspecified, with perforation and abscess without bleeding: Secondary | ICD-10-CM | POA: Diagnosis not present

## 2015-12-03 DIAGNOSIS — I2510 Atherosclerotic heart disease of native coronary artery without angina pectoris: Secondary | ICD-10-CM | POA: Diagnosis not present

## 2015-12-04 DIAGNOSIS — E559 Vitamin D deficiency, unspecified: Secondary | ICD-10-CM | POA: Diagnosis not present

## 2015-12-04 DIAGNOSIS — E039 Hypothyroidism, unspecified: Secondary | ICD-10-CM | POA: Diagnosis not present

## 2015-12-04 DIAGNOSIS — E1140 Type 2 diabetes mellitus with diabetic neuropathy, unspecified: Secondary | ICD-10-CM | POA: Diagnosis not present

## 2015-12-04 DIAGNOSIS — E7800 Pure hypercholesterolemia, unspecified: Secondary | ICD-10-CM | POA: Diagnosis not present

## 2015-12-04 DIAGNOSIS — Z0000 Encounter for general adult medical examination without abnormal findings: Secondary | ICD-10-CM | POA: Diagnosis not present

## 2015-12-04 DIAGNOSIS — M810 Age-related osteoporosis without current pathological fracture: Secondary | ICD-10-CM | POA: Diagnosis not present

## 2015-12-04 DIAGNOSIS — Z0001 Encounter for general adult medical examination with abnormal findings: Secondary | ICD-10-CM | POA: Diagnosis not present

## 2015-12-04 DIAGNOSIS — K651 Peritoneal abscess: Secondary | ICD-10-CM | POA: Diagnosis not present

## 2015-12-04 DIAGNOSIS — I10 Essential (primary) hypertension: Secondary | ICD-10-CM | POA: Diagnosis not present

## 2015-12-09 DIAGNOSIS — I2510 Atherosclerotic heart disease of native coronary artery without angina pectoris: Secondary | ICD-10-CM | POA: Diagnosis not present

## 2015-12-09 DIAGNOSIS — Z48815 Encounter for surgical aftercare following surgery on the digestive system: Secondary | ICD-10-CM | POA: Diagnosis not present

## 2015-12-09 DIAGNOSIS — K5780 Diverticulitis of intestine, part unspecified, with perforation and abscess without bleeding: Secondary | ICD-10-CM | POA: Diagnosis not present

## 2015-12-09 DIAGNOSIS — E1142 Type 2 diabetes mellitus with diabetic polyneuropathy: Secondary | ICD-10-CM | POA: Diagnosis not present

## 2015-12-09 DIAGNOSIS — E039 Hypothyroidism, unspecified: Secondary | ICD-10-CM | POA: Diagnosis not present

## 2015-12-09 DIAGNOSIS — M810 Age-related osteoporosis without current pathological fracture: Secondary | ICD-10-CM | POA: Diagnosis not present

## 2015-12-15 DIAGNOSIS — E1142 Type 2 diabetes mellitus with diabetic polyneuropathy: Secondary | ICD-10-CM | POA: Diagnosis not present

## 2015-12-15 DIAGNOSIS — K5780 Diverticulitis of intestine, part unspecified, with perforation and abscess without bleeding: Secondary | ICD-10-CM | POA: Diagnosis not present

## 2015-12-15 DIAGNOSIS — Z48815 Encounter for surgical aftercare following surgery on the digestive system: Secondary | ICD-10-CM | POA: Diagnosis not present

## 2015-12-15 DIAGNOSIS — M810 Age-related osteoporosis without current pathological fracture: Secondary | ICD-10-CM | POA: Diagnosis not present

## 2015-12-15 DIAGNOSIS — I2510 Atherosclerotic heart disease of native coronary artery without angina pectoris: Secondary | ICD-10-CM | POA: Diagnosis not present

## 2015-12-15 DIAGNOSIS — E039 Hypothyroidism, unspecified: Secondary | ICD-10-CM | POA: Diagnosis not present

## 2015-12-23 DIAGNOSIS — E039 Hypothyroidism, unspecified: Secondary | ICD-10-CM | POA: Diagnosis not present

## 2015-12-23 DIAGNOSIS — K5780 Diverticulitis of intestine, part unspecified, with perforation and abscess without bleeding: Secondary | ICD-10-CM | POA: Diagnosis not present

## 2015-12-23 DIAGNOSIS — I2510 Atherosclerotic heart disease of native coronary artery without angina pectoris: Secondary | ICD-10-CM | POA: Diagnosis not present

## 2015-12-23 DIAGNOSIS — M810 Age-related osteoporosis without current pathological fracture: Secondary | ICD-10-CM | POA: Diagnosis not present

## 2015-12-23 DIAGNOSIS — E1142 Type 2 diabetes mellitus with diabetic polyneuropathy: Secondary | ICD-10-CM | POA: Diagnosis not present

## 2015-12-23 DIAGNOSIS — Z48815 Encounter for surgical aftercare following surgery on the digestive system: Secondary | ICD-10-CM | POA: Diagnosis not present

## 2015-12-25 DIAGNOSIS — S31109A Unspecified open wound of abdominal wall, unspecified quadrant without penetration into peritoneal cavity, initial encounter: Secondary | ICD-10-CM | POA: Diagnosis not present

## 2015-12-25 DIAGNOSIS — Z719 Counseling, unspecified: Secondary | ICD-10-CM | POA: Diagnosis not present

## 2015-12-25 DIAGNOSIS — L089 Local infection of the skin and subcutaneous tissue, unspecified: Secondary | ICD-10-CM | POA: Diagnosis not present

## 2015-12-30 DIAGNOSIS — E039 Hypothyroidism, unspecified: Secondary | ICD-10-CM | POA: Diagnosis not present

## 2015-12-30 DIAGNOSIS — Z23 Encounter for immunization: Secondary | ICD-10-CM | POA: Diagnosis not present

## 2015-12-30 DIAGNOSIS — E1142 Type 2 diabetes mellitus with diabetic polyneuropathy: Secondary | ICD-10-CM | POA: Diagnosis not present

## 2015-12-30 DIAGNOSIS — M810 Age-related osteoporosis without current pathological fracture: Secondary | ICD-10-CM | POA: Diagnosis not present

## 2015-12-30 DIAGNOSIS — K5780 Diverticulitis of intestine, part unspecified, with perforation and abscess without bleeding: Secondary | ICD-10-CM | POA: Diagnosis not present

## 2015-12-30 DIAGNOSIS — I2510 Atherosclerotic heart disease of native coronary artery without angina pectoris: Secondary | ICD-10-CM | POA: Diagnosis not present

## 2015-12-30 DIAGNOSIS — Z48815 Encounter for surgical aftercare following surgery on the digestive system: Secondary | ICD-10-CM | POA: Diagnosis not present

## 2016-01-06 DIAGNOSIS — M810 Age-related osteoporosis without current pathological fracture: Secondary | ICD-10-CM | POA: Diagnosis not present

## 2016-01-06 DIAGNOSIS — K5780 Diverticulitis of intestine, part unspecified, with perforation and abscess without bleeding: Secondary | ICD-10-CM | POA: Diagnosis not present

## 2016-01-06 DIAGNOSIS — E039 Hypothyroidism, unspecified: Secondary | ICD-10-CM | POA: Diagnosis not present

## 2016-01-06 DIAGNOSIS — I2510 Atherosclerotic heart disease of native coronary artery without angina pectoris: Secondary | ICD-10-CM | POA: Diagnosis not present

## 2016-01-06 DIAGNOSIS — Z48815 Encounter for surgical aftercare following surgery on the digestive system: Secondary | ICD-10-CM | POA: Diagnosis not present

## 2016-01-06 DIAGNOSIS — E1142 Type 2 diabetes mellitus with diabetic polyneuropathy: Secondary | ICD-10-CM | POA: Diagnosis not present

## 2016-01-14 DIAGNOSIS — H21302 Idiopathic cysts of iris, ciliary body or anterior chamber, left eye: Secondary | ICD-10-CM | POA: Diagnosis not present

## 2016-01-19 DIAGNOSIS — E119 Type 2 diabetes mellitus without complications: Secondary | ICD-10-CM | POA: Diagnosis not present

## 2016-01-19 DIAGNOSIS — H401132 Primary open-angle glaucoma, bilateral, moderate stage: Secondary | ICD-10-CM | POA: Diagnosis not present

## 2016-01-19 DIAGNOSIS — H21302 Idiopathic cysts of iris, ciliary body or anterior chamber, left eye: Secondary | ICD-10-CM | POA: Diagnosis not present

## 2016-01-22 DIAGNOSIS — I2510 Atherosclerotic heart disease of native coronary artery without angina pectoris: Secondary | ICD-10-CM | POA: Diagnosis not present

## 2016-01-22 DIAGNOSIS — K5780 Diverticulitis of intestine, part unspecified, with perforation and abscess without bleeding: Secondary | ICD-10-CM | POA: Diagnosis not present

## 2016-01-22 DIAGNOSIS — M810 Age-related osteoporosis without current pathological fracture: Secondary | ICD-10-CM | POA: Diagnosis not present

## 2016-01-22 DIAGNOSIS — Z48815 Encounter for surgical aftercare following surgery on the digestive system: Secondary | ICD-10-CM | POA: Diagnosis not present

## 2016-01-22 DIAGNOSIS — E1142 Type 2 diabetes mellitus with diabetic polyneuropathy: Secondary | ICD-10-CM | POA: Diagnosis not present

## 2016-01-22 DIAGNOSIS — E039 Hypothyroidism, unspecified: Secondary | ICD-10-CM | POA: Diagnosis not present

## 2016-02-10 DIAGNOSIS — I10 Essential (primary) hypertension: Secondary | ICD-10-CM | POA: Diagnosis not present

## 2016-02-10 DIAGNOSIS — I2510 Atherosclerotic heart disease of native coronary artery without angina pectoris: Secondary | ICD-10-CM | POA: Diagnosis not present

## 2016-02-10 DIAGNOSIS — Z8673 Personal history of transient ischemic attack (TIA), and cerebral infarction without residual deficits: Secondary | ICD-10-CM | POA: Diagnosis not present

## 2016-02-10 DIAGNOSIS — K651 Peritoneal abscess: Secondary | ICD-10-CM | POA: Diagnosis not present

## 2016-02-10 DIAGNOSIS — E1165 Type 2 diabetes mellitus with hyperglycemia: Secondary | ICD-10-CM | POA: Diagnosis not present

## 2016-02-10 DIAGNOSIS — Z87891 Personal history of nicotine dependence: Secondary | ICD-10-CM | POA: Diagnosis not present

## 2016-02-10 DIAGNOSIS — L02211 Cutaneous abscess of abdominal wall: Secondary | ICD-10-CM | POA: Diagnosis not present

## 2016-02-10 DIAGNOSIS — E039 Hypothyroidism, unspecified: Secondary | ICD-10-CM | POA: Diagnosis not present

## 2016-02-10 DIAGNOSIS — R531 Weakness: Secondary | ICD-10-CM | POA: Diagnosis not present

## 2016-02-10 DIAGNOSIS — N390 Urinary tract infection, site not specified: Secondary | ICD-10-CM | POA: Diagnosis not present

## 2016-02-10 DIAGNOSIS — Z7982 Long term (current) use of aspirin: Secondary | ICD-10-CM | POA: Diagnosis not present

## 2016-02-16 DIAGNOSIS — K439 Ventral hernia without obstruction or gangrene: Secondary | ICD-10-CM | POA: Diagnosis not present

## 2016-02-16 DIAGNOSIS — R1031 Right lower quadrant pain: Secondary | ICD-10-CM | POA: Diagnosis not present

## 2016-02-16 DIAGNOSIS — R0781 Pleurodynia: Secondary | ICD-10-CM | POA: Diagnosis not present

## 2016-02-16 DIAGNOSIS — R509 Fever, unspecified: Secondary | ICD-10-CM | POA: Diagnosis not present

## 2016-02-16 DIAGNOSIS — L409 Psoriasis, unspecified: Secondary | ICD-10-CM | POA: Diagnosis present

## 2016-02-16 DIAGNOSIS — K632 Fistula of intestine: Secondary | ICD-10-CM | POA: Diagnosis not present

## 2016-02-16 DIAGNOSIS — G92 Toxic encephalopathy: Secondary | ICD-10-CM | POA: Diagnosis not present

## 2016-02-16 DIAGNOSIS — N281 Cyst of kidney, acquired: Secondary | ICD-10-CM | POA: Diagnosis not present

## 2016-02-16 DIAGNOSIS — R41841 Cognitive communication deficit: Secondary | ICD-10-CM | POA: Diagnosis not present

## 2016-02-16 DIAGNOSIS — K5792 Diverticulitis of intestine, part unspecified, without perforation or abscess without bleeding: Secondary | ICD-10-CM | POA: Diagnosis not present

## 2016-02-16 DIAGNOSIS — L02216 Cutaneous abscess of umbilicus: Secondary | ICD-10-CM | POA: Diagnosis not present

## 2016-02-16 DIAGNOSIS — N179 Acute kidney failure, unspecified: Secondary | ICD-10-CM | POA: Diagnosis not present

## 2016-02-16 DIAGNOSIS — E039 Hypothyroidism, unspecified: Secondary | ICD-10-CM | POA: Diagnosis present

## 2016-02-16 DIAGNOSIS — I6529 Occlusion and stenosis of unspecified carotid artery: Secondary | ICD-10-CM | POA: Diagnosis present

## 2016-02-16 DIAGNOSIS — E1165 Type 2 diabetes mellitus with hyperglycemia: Secondary | ICD-10-CM | POA: Diagnosis not present

## 2016-02-16 DIAGNOSIS — M25552 Pain in left hip: Secondary | ICD-10-CM | POA: Diagnosis not present

## 2016-02-16 DIAGNOSIS — M1990 Unspecified osteoarthritis, unspecified site: Secondary | ICD-10-CM | POA: Diagnosis present

## 2016-02-16 DIAGNOSIS — Z792 Long term (current) use of antibiotics: Secondary | ICD-10-CM | POA: Diagnosis not present

## 2016-02-16 DIAGNOSIS — M25551 Pain in right hip: Secondary | ICD-10-CM | POA: Diagnosis not present

## 2016-02-16 DIAGNOSIS — L02211 Cutaneous abscess of abdominal wall: Secondary | ICD-10-CM | POA: Diagnosis not present

## 2016-02-16 DIAGNOSIS — T814XXA Infection following a procedure, initial encounter: Secondary | ICD-10-CM | POA: Diagnosis not present

## 2016-02-16 DIAGNOSIS — E119 Type 2 diabetes mellitus without complications: Secondary | ICD-10-CM | POA: Diagnosis not present

## 2016-02-16 DIAGNOSIS — S99921A Unspecified injury of right foot, initial encounter: Secondary | ICD-10-CM | POA: Diagnosis not present

## 2016-02-16 DIAGNOSIS — L03311 Cellulitis of abdominal wall: Secondary | ICD-10-CM | POA: Diagnosis not present

## 2016-02-16 DIAGNOSIS — Z8673 Personal history of transient ischemic attack (TIA), and cerebral infarction without residual deficits: Secondary | ICD-10-CM | POA: Diagnosis not present

## 2016-02-16 DIAGNOSIS — E1140 Type 2 diabetes mellitus with diabetic neuropathy, unspecified: Secondary | ICD-10-CM | POA: Diagnosis not present

## 2016-02-16 DIAGNOSIS — R1312 Dysphagia, oropharyngeal phase: Secondary | ICD-10-CM | POA: Diagnosis not present

## 2016-02-16 DIAGNOSIS — E871 Hypo-osmolality and hyponatremia: Secondary | ICD-10-CM | POA: Diagnosis not present

## 2016-02-16 DIAGNOSIS — R296 Repeated falls: Secondary | ICD-10-CM | POA: Diagnosis not present

## 2016-02-16 DIAGNOSIS — I10 Essential (primary) hypertension: Secondary | ICD-10-CM | POA: Diagnosis not present

## 2016-02-16 DIAGNOSIS — D649 Anemia, unspecified: Secondary | ICD-10-CM | POA: Diagnosis not present

## 2016-02-16 DIAGNOSIS — M6281 Muscle weakness (generalized): Secondary | ICD-10-CM | POA: Diagnosis not present

## 2016-02-16 DIAGNOSIS — R030 Elevated blood-pressure reading, without diagnosis of hypertension: Secondary | ICD-10-CM | POA: Diagnosis not present

## 2016-02-16 DIAGNOSIS — K651 Peritoneal abscess: Secondary | ICD-10-CM | POA: Diagnosis not present

## 2016-02-16 DIAGNOSIS — M810 Age-related osteoporosis without current pathological fracture: Secondary | ICD-10-CM | POA: Diagnosis not present

## 2016-02-16 DIAGNOSIS — Z48817 Encounter for surgical aftercare following surgery on the skin and subcutaneous tissue: Secondary | ICD-10-CM | POA: Diagnosis not present

## 2016-02-16 DIAGNOSIS — E46 Unspecified protein-calorie malnutrition: Secondary | ICD-10-CM | POA: Diagnosis not present

## 2016-02-16 DIAGNOSIS — H409 Unspecified glaucoma: Secondary | ICD-10-CM | POA: Diagnosis not present

## 2016-02-16 DIAGNOSIS — Z91048 Other nonmedicinal substance allergy status: Secondary | ICD-10-CM | POA: Diagnosis not present

## 2016-02-16 DIAGNOSIS — I2510 Atherosclerotic heart disease of native coronary artery without angina pectoris: Secondary | ICD-10-CM | POA: Diagnosis present

## 2016-02-26 DIAGNOSIS — L03311 Cellulitis of abdominal wall: Secondary | ICD-10-CM | POA: Diagnosis not present

## 2016-02-26 DIAGNOSIS — Z792 Long term (current) use of antibiotics: Secondary | ICD-10-CM | POA: Diagnosis not present

## 2016-02-26 DIAGNOSIS — Z8673 Personal history of transient ischemic attack (TIA), and cerebral infarction without residual deficits: Secondary | ICD-10-CM | POA: Diagnosis not present

## 2016-02-26 DIAGNOSIS — N390 Urinary tract infection, site not specified: Secondary | ICD-10-CM | POA: Diagnosis not present

## 2016-02-26 DIAGNOSIS — E119 Type 2 diabetes mellitus without complications: Secondary | ICD-10-CM | POA: Diagnosis not present

## 2016-02-26 DIAGNOSIS — R3129 Other microscopic hematuria: Secondary | ICD-10-CM | POA: Diagnosis not present

## 2016-02-26 DIAGNOSIS — N170 Acute kidney failure with tubular necrosis: Secondary | ICD-10-CM | POA: Diagnosis not present

## 2016-02-26 DIAGNOSIS — J690 Pneumonitis due to inhalation of food and vomit: Secondary | ICD-10-CM | POA: Diagnosis not present

## 2016-02-26 DIAGNOSIS — E43 Unspecified severe protein-calorie malnutrition: Secondary | ICD-10-CM | POA: Diagnosis not present

## 2016-02-26 DIAGNOSIS — I10 Essential (primary) hypertension: Secondary | ICD-10-CM | POA: Diagnosis not present

## 2016-02-26 DIAGNOSIS — H409 Unspecified glaucoma: Secondary | ICD-10-CM | POA: Diagnosis not present

## 2016-02-26 DIAGNOSIS — E871 Hypo-osmolality and hyponatremia: Secondary | ICD-10-CM | POA: Diagnosis not present

## 2016-02-26 DIAGNOSIS — M6281 Muscle weakness (generalized): Secondary | ICD-10-CM | POA: Diagnosis not present

## 2016-02-26 DIAGNOSIS — Z139 Encounter for screening, unspecified: Secondary | ICD-10-CM | POA: Diagnosis not present

## 2016-02-26 DIAGNOSIS — N179 Acute kidney failure, unspecified: Secondary | ICD-10-CM | POA: Diagnosis not present

## 2016-02-26 DIAGNOSIS — S065X9A Traumatic subdural hemorrhage with loss of consciousness of unspecified duration, initial encounter: Secondary | ICD-10-CM | POA: Diagnosis not present

## 2016-02-26 DIAGNOSIS — E46 Unspecified protein-calorie malnutrition: Secondary | ICD-10-CM | POA: Diagnosis not present

## 2016-02-26 DIAGNOSIS — R296 Repeated falls: Secondary | ICD-10-CM | POA: Diagnosis not present

## 2016-02-26 DIAGNOSIS — E039 Hypothyroidism, unspecified: Secondary | ICD-10-CM | POA: Diagnosis not present

## 2016-02-26 DIAGNOSIS — B370 Candidal stomatitis: Secondary | ICD-10-CM | POA: Diagnosis not present

## 2016-02-26 DIAGNOSIS — K5792 Diverticulitis of intestine, part unspecified, without perforation or abscess without bleeding: Secondary | ICD-10-CM | POA: Diagnosis not present

## 2016-02-26 DIAGNOSIS — R531 Weakness: Secondary | ICD-10-CM | POA: Diagnosis not present

## 2016-02-26 DIAGNOSIS — I639 Cerebral infarction, unspecified: Secondary | ICD-10-CM | POA: Diagnosis not present

## 2016-02-26 DIAGNOSIS — J9600 Acute respiratory failure, unspecified whether with hypoxia or hypercapnia: Secondary | ICD-10-CM | POA: Diagnosis not present

## 2016-02-26 DIAGNOSIS — E1165 Type 2 diabetes mellitus with hyperglycemia: Secondary | ICD-10-CM | POA: Diagnosis not present

## 2016-02-26 DIAGNOSIS — L02211 Cutaneous abscess of abdominal wall: Secondary | ICD-10-CM | POA: Diagnosis not present

## 2016-02-26 DIAGNOSIS — I6529 Occlusion and stenosis of unspecified carotid artery: Secondary | ICD-10-CM | POA: Diagnosis not present

## 2016-02-26 DIAGNOSIS — R1312 Dysphagia, oropharyngeal phase: Secondary | ICD-10-CM | POA: Diagnosis not present

## 2016-02-26 DIAGNOSIS — E1140 Type 2 diabetes mellitus with diabetic neuropathy, unspecified: Secondary | ICD-10-CM | POA: Diagnosis not present

## 2016-02-26 DIAGNOSIS — R41841 Cognitive communication deficit: Secondary | ICD-10-CM | POA: Diagnosis not present

## 2016-02-26 DIAGNOSIS — D649 Anemia, unspecified: Secondary | ICD-10-CM | POA: Diagnosis not present

## 2016-02-26 DIAGNOSIS — N281 Cyst of kidney, acquired: Secondary | ICD-10-CM | POA: Diagnosis not present

## 2016-02-26 DIAGNOSIS — Z48817 Encounter for surgical aftercare following surgery on the skin and subcutaneous tissue: Secondary | ICD-10-CM | POA: Diagnosis not present

## 2016-02-26 DIAGNOSIS — E872 Acidosis: Secondary | ICD-10-CM | POA: Diagnosis not present

## 2016-02-26 DIAGNOSIS — R4182 Altered mental status, unspecified: Secondary | ICD-10-CM | POA: Diagnosis not present

## 2016-02-26 DIAGNOSIS — E870 Hyperosmolality and hypernatremia: Secondary | ICD-10-CM | POA: Diagnosis not present

## 2016-02-26 DIAGNOSIS — L409 Psoriasis, unspecified: Secondary | ICD-10-CM | POA: Diagnosis not present

## 2016-02-26 DIAGNOSIS — G9341 Metabolic encephalopathy: Secondary | ICD-10-CM | POA: Diagnosis not present

## 2016-02-26 DIAGNOSIS — M1990 Unspecified osteoarthritis, unspecified site: Secondary | ICD-10-CM | POA: Diagnosis not present

## 2016-02-26 DIAGNOSIS — I2510 Atherosclerotic heart disease of native coronary artery without angina pectoris: Secondary | ICD-10-CM | POA: Diagnosis not present

## 2016-02-26 DIAGNOSIS — R402411 Glasgow coma scale score 13-15, in the field [EMT or ambulance]: Secondary | ICD-10-CM | POA: Diagnosis not present

## 2016-02-26 DIAGNOSIS — N1330 Unspecified hydronephrosis: Secondary | ICD-10-CM | POA: Diagnosis not present

## 2016-02-26 DIAGNOSIS — M810 Age-related osteoporosis without current pathological fracture: Secondary | ICD-10-CM | POA: Diagnosis not present

## 2016-02-26 DIAGNOSIS — K632 Fistula of intestine: Secondary | ICD-10-CM | POA: Diagnosis not present

## 2016-02-26 DIAGNOSIS — R410 Disorientation, unspecified: Secondary | ICD-10-CM | POA: Diagnosis not present

## 2016-02-26 DIAGNOSIS — Z719 Counseling, unspecified: Secondary | ICD-10-CM | POA: Diagnosis not present

## 2016-02-26 DIAGNOSIS — K651 Peritoneal abscess: Secondary | ICD-10-CM | POA: Diagnosis not present

## 2016-02-26 DIAGNOSIS — J181 Lobar pneumonia, unspecified organism: Secondary | ICD-10-CM | POA: Diagnosis not present

## 2016-03-01 DIAGNOSIS — L02211 Cutaneous abscess of abdominal wall: Secondary | ICD-10-CM | POA: Diagnosis not present

## 2016-03-01 DIAGNOSIS — E871 Hypo-osmolality and hyponatremia: Secondary | ICD-10-CM | POA: Diagnosis not present

## 2016-03-01 DIAGNOSIS — N179 Acute kidney failure, unspecified: Secondary | ICD-10-CM | POA: Diagnosis not present

## 2016-03-01 DIAGNOSIS — Z8673 Personal history of transient ischemic attack (TIA), and cerebral infarction without residual deficits: Secondary | ICD-10-CM | POA: Diagnosis not present

## 2016-03-02 DIAGNOSIS — Z139 Encounter for screening, unspecified: Secondary | ICD-10-CM | POA: Diagnosis not present

## 2016-03-02 DIAGNOSIS — Z719 Counseling, unspecified: Secondary | ICD-10-CM | POA: Diagnosis not present

## 2016-03-02 DIAGNOSIS — L02211 Cutaneous abscess of abdominal wall: Secondary | ICD-10-CM | POA: Diagnosis not present

## 2016-03-02 DIAGNOSIS — K651 Peritoneal abscess: Secondary | ICD-10-CM | POA: Diagnosis not present

## 2016-03-03 DIAGNOSIS — E871 Hypo-osmolality and hyponatremia: Secondary | ICD-10-CM | POA: Diagnosis not present

## 2016-03-03 DIAGNOSIS — L02211 Cutaneous abscess of abdominal wall: Secondary | ICD-10-CM | POA: Diagnosis not present

## 2016-03-03 DIAGNOSIS — E1165 Type 2 diabetes mellitus with hyperglycemia: Secondary | ICD-10-CM | POA: Diagnosis not present

## 2016-03-03 DIAGNOSIS — N179 Acute kidney failure, unspecified: Secondary | ICD-10-CM | POA: Diagnosis not present

## 2016-03-04 DIAGNOSIS — K651 Peritoneal abscess: Secondary | ICD-10-CM | POA: Diagnosis not present

## 2016-03-04 DIAGNOSIS — I639 Cerebral infarction, unspecified: Secondary | ICD-10-CM | POA: Diagnosis not present

## 2016-03-04 DIAGNOSIS — E119 Type 2 diabetes mellitus without complications: Secondary | ICD-10-CM | POA: Diagnosis not present

## 2016-03-05 DIAGNOSIS — R3129 Other microscopic hematuria: Secondary | ICD-10-CM | POA: Diagnosis not present

## 2016-03-05 DIAGNOSIS — L02211 Cutaneous abscess of abdominal wall: Secondary | ICD-10-CM | POA: Diagnosis not present

## 2016-03-07 DIAGNOSIS — E872 Acidosis: Secondary | ICD-10-CM | POA: Diagnosis not present

## 2016-03-07 DIAGNOSIS — N183 Chronic kidney disease, stage 3 (moderate): Secondary | ICD-10-CM | POA: Diagnosis present

## 2016-03-07 DIAGNOSIS — E860 Dehydration: Secondary | ICD-10-CM | POA: Diagnosis present

## 2016-03-07 DIAGNOSIS — G9341 Metabolic encephalopathy: Secondary | ICD-10-CM | POA: Diagnosis not present

## 2016-03-07 DIAGNOSIS — R410 Disorientation, unspecified: Secondary | ICD-10-CM | POA: Diagnosis not present

## 2016-03-07 DIAGNOSIS — F0781 Postconcussional syndrome: Secondary | ICD-10-CM | POA: Diagnosis not present

## 2016-03-07 DIAGNOSIS — J984 Other disorders of lung: Secondary | ICD-10-CM | POA: Diagnosis not present

## 2016-03-07 DIAGNOSIS — R509 Fever, unspecified: Secondary | ICD-10-CM | POA: Diagnosis not present

## 2016-03-07 DIAGNOSIS — I69391 Dysphagia following cerebral infarction: Secondary | ICD-10-CM | POA: Diagnosis not present

## 2016-03-07 DIAGNOSIS — R1310 Dysphagia, unspecified: Secondary | ICD-10-CM | POA: Diagnosis not present

## 2016-03-07 DIAGNOSIS — R911 Solitary pulmonary nodule: Secondary | ICD-10-CM | POA: Diagnosis not present

## 2016-03-07 DIAGNOSIS — I639 Cerebral infarction, unspecified: Secondary | ICD-10-CM | POA: Diagnosis not present

## 2016-03-07 DIAGNOSIS — E46 Unspecified protein-calorie malnutrition: Secondary | ICD-10-CM | POA: Diagnosis not present

## 2016-03-07 DIAGNOSIS — E873 Alkalosis: Secondary | ICD-10-CM | POA: Diagnosis not present

## 2016-03-07 DIAGNOSIS — D649 Anemia, unspecified: Secondary | ICD-10-CM | POA: Diagnosis not present

## 2016-03-07 DIAGNOSIS — K439 Ventral hernia without obstruction or gangrene: Secondary | ICD-10-CM | POA: Diagnosis not present

## 2016-03-07 DIAGNOSIS — E871 Hypo-osmolality and hyponatremia: Secondary | ICD-10-CM | POA: Diagnosis not present

## 2016-03-07 DIAGNOSIS — I129 Hypertensive chronic kidney disease with stage 1 through stage 4 chronic kidney disease, or unspecified chronic kidney disease: Secondary | ICD-10-CM | POA: Diagnosis present

## 2016-03-07 DIAGNOSIS — R4182 Altered mental status, unspecified: Secondary | ICD-10-CM | POA: Diagnosis not present

## 2016-03-07 DIAGNOSIS — I6203 Nontraumatic chronic subdural hemorrhage: Secondary | ICD-10-CM | POA: Diagnosis not present

## 2016-03-07 DIAGNOSIS — R1312 Dysphagia, oropharyngeal phase: Secondary | ICD-10-CM | POA: Diagnosis not present

## 2016-03-07 DIAGNOSIS — Z4682 Encounter for fitting and adjustment of non-vascular catheter: Secondary | ICD-10-CM | POA: Diagnosis not present

## 2016-03-07 DIAGNOSIS — R109 Unspecified abdominal pain: Secondary | ICD-10-CM | POA: Diagnosis not present

## 2016-03-07 DIAGNOSIS — R627 Adult failure to thrive: Secondary | ICD-10-CM | POA: Diagnosis not present

## 2016-03-07 DIAGNOSIS — R29818 Other symptoms and signs involving the nervous system: Secondary | ICD-10-CM | POA: Diagnosis not present

## 2016-03-07 DIAGNOSIS — E1140 Type 2 diabetes mellitus with diabetic neuropathy, unspecified: Secondary | ICD-10-CM | POA: Diagnosis not present

## 2016-03-07 DIAGNOSIS — Z1621 Resistance to vancomycin: Secondary | ICD-10-CM | POA: Diagnosis not present

## 2016-03-07 DIAGNOSIS — K632 Fistula of intestine: Secondary | ICD-10-CM | POA: Diagnosis not present

## 2016-03-07 DIAGNOSIS — L02211 Cutaneous abscess of abdominal wall: Secondary | ICD-10-CM | POA: Diagnosis not present

## 2016-03-07 DIAGNOSIS — G9340 Encephalopathy, unspecified: Secondary | ICD-10-CM | POA: Diagnosis not present

## 2016-03-07 DIAGNOSIS — I2510 Atherosclerotic heart disease of native coronary artery without angina pectoris: Secondary | ICD-10-CM | POA: Diagnosis not present

## 2016-03-07 DIAGNOSIS — E1122 Type 2 diabetes mellitus with diabetic chronic kidney disease: Secondary | ICD-10-CM | POA: Diagnosis present

## 2016-03-07 DIAGNOSIS — R339 Retention of urine, unspecified: Secondary | ICD-10-CM | POA: Diagnosis not present

## 2016-03-07 DIAGNOSIS — N179 Acute kidney failure, unspecified: Secondary | ICD-10-CM | POA: Diagnosis not present

## 2016-03-07 DIAGNOSIS — Z452 Encounter for adjustment and management of vascular access device: Secondary | ICD-10-CM | POA: Diagnosis not present

## 2016-03-07 DIAGNOSIS — R918 Other nonspecific abnormal finding of lung field: Secondary | ICD-10-CM | POA: Diagnosis not present

## 2016-03-07 DIAGNOSIS — J9600 Acute respiratory failure, unspecified whether with hypoxia or hypercapnia: Secondary | ICD-10-CM | POA: Diagnosis not present

## 2016-03-07 DIAGNOSIS — E43 Unspecified severe protein-calorie malnutrition: Secondary | ICD-10-CM | POA: Diagnosis not present

## 2016-03-07 DIAGNOSIS — E870 Hyperosmolality and hypernatremia: Secondary | ICD-10-CM | POA: Diagnosis not present

## 2016-03-07 DIAGNOSIS — R278 Other lack of coordination: Secondary | ICD-10-CM | POA: Diagnosis not present

## 2016-03-07 DIAGNOSIS — I10 Essential (primary) hypertension: Secondary | ICD-10-CM | POA: Diagnosis not present

## 2016-03-07 DIAGNOSIS — R531 Weakness: Secondary | ICD-10-CM | POA: Diagnosis not present

## 2016-03-07 DIAGNOSIS — I491 Atrial premature depolarization: Secondary | ICD-10-CM | POA: Diagnosis not present

## 2016-03-07 DIAGNOSIS — G9389 Other specified disorders of brain: Secondary | ICD-10-CM | POA: Diagnosis not present

## 2016-03-07 DIAGNOSIS — B370 Candidal stomatitis: Secondary | ICD-10-CM | POA: Diagnosis not present

## 2016-03-07 DIAGNOSIS — K828 Other specified diseases of gallbladder: Secondary | ICD-10-CM | POA: Diagnosis not present

## 2016-03-07 DIAGNOSIS — J189 Pneumonia, unspecified organism: Secondary | ICD-10-CM | POA: Diagnosis not present

## 2016-03-07 DIAGNOSIS — R0602 Shortness of breath: Secondary | ICD-10-CM | POA: Diagnosis not present

## 2016-03-07 DIAGNOSIS — J690 Pneumonitis due to inhalation of food and vomit: Secondary | ICD-10-CM | POA: Diagnosis not present

## 2016-03-07 DIAGNOSIS — Z431 Encounter for attention to gastrostomy: Secondary | ICD-10-CM | POA: Diagnosis not present

## 2016-03-07 DIAGNOSIS — J181 Lobar pneumonia, unspecified organism: Secondary | ICD-10-CM | POA: Diagnosis not present

## 2016-03-07 DIAGNOSIS — K8689 Other specified diseases of pancreas: Secondary | ICD-10-CM | POA: Diagnosis not present

## 2016-03-07 DIAGNOSIS — I6201 Nontraumatic acute subdural hemorrhage: Secondary | ICD-10-CM | POA: Diagnosis not present

## 2016-03-07 DIAGNOSIS — S065X9A Traumatic subdural hemorrhage with loss of consciousness of unspecified duration, initial encounter: Secondary | ICD-10-CM | POA: Diagnosis not present

## 2016-03-07 DIAGNOSIS — B952 Enterococcus as the cause of diseases classified elsewhere: Secondary | ICD-10-CM | POA: Diagnosis not present

## 2016-03-07 DIAGNOSIS — G92 Toxic encephalopathy: Secondary | ICD-10-CM | POA: Diagnosis not present

## 2016-03-07 DIAGNOSIS — R9431 Abnormal electrocardiogram [ECG] [EKG]: Secondary | ICD-10-CM | POA: Diagnosis not present

## 2016-03-07 DIAGNOSIS — R935 Abnormal findings on diagnostic imaging of other abdominal regions, including retroperitoneum: Secondary | ICD-10-CM | POA: Diagnosis not present

## 2016-03-07 DIAGNOSIS — S2242XA Multiple fractures of ribs, left side, initial encounter for closed fracture: Secondary | ICD-10-CM | POA: Diagnosis not present

## 2016-03-07 DIAGNOSIS — K449 Diaphragmatic hernia without obstruction or gangrene: Secondary | ICD-10-CM | POA: Diagnosis not present

## 2016-03-07 DIAGNOSIS — E039 Hypothyroidism, unspecified: Secondary | ICD-10-CM | POA: Diagnosis present

## 2016-03-07 DIAGNOSIS — L03311 Cellulitis of abdominal wall: Secondary | ICD-10-CM | POA: Diagnosis not present

## 2016-03-07 DIAGNOSIS — M6281 Muscle weakness (generalized): Secondary | ICD-10-CM | POA: Diagnosis not present

## 2016-03-07 DIAGNOSIS — R0989 Other specified symptoms and signs involving the circulatory and respiratory systems: Secondary | ICD-10-CM | POA: Diagnosis not present

## 2016-03-07 DIAGNOSIS — N281 Cyst of kidney, acquired: Secondary | ICD-10-CM | POA: Diagnosis not present

## 2016-03-07 DIAGNOSIS — M1990 Unspecified osteoarthritis, unspecified site: Secondary | ICD-10-CM | POA: Diagnosis not present

## 2016-03-07 DIAGNOSIS — I6200 Nontraumatic subdural hemorrhage, unspecified: Secondary | ICD-10-CM | POA: Diagnosis not present

## 2016-03-07 DIAGNOSIS — F0390 Unspecified dementia without behavioral disturbance: Secondary | ICD-10-CM | POA: Diagnosis present

## 2016-03-07 DIAGNOSIS — N170 Acute kidney failure with tubular necrosis: Secondary | ICD-10-CM | POA: Diagnosis not present

## 2016-03-07 DIAGNOSIS — N390 Urinary tract infection, site not specified: Secondary | ICD-10-CM | POA: Diagnosis not present

## 2016-03-07 DIAGNOSIS — R402342 Coma scale, best motor response, flexion withdrawal, at arrival to emergency department: Secondary | ICD-10-CM | POA: Diagnosis present

## 2016-03-07 DIAGNOSIS — R9082 White matter disease, unspecified: Secondary | ICD-10-CM | POA: Diagnosis not present

## 2016-04-22 ENCOUNTER — Inpatient Hospital Stay
Admission: RE | Admit: 2016-04-22 | Discharge: 2016-05-03 | Disposition: A | Discharge status: Nursing Home (Includes ICF) | Payer: Medicare Other | Source: Ambulatory Visit | Attending: Pulmonary Disease | Admitting: Pulmonary Disease

## 2016-04-22 DIAGNOSIS — Z431 Encounter for attention to gastrostomy: Secondary | ICD-10-CM | POA: Diagnosis not present

## 2016-04-22 DIAGNOSIS — D649 Anemia, unspecified: Secondary | ICD-10-CM | POA: Diagnosis not present

## 2016-04-22 DIAGNOSIS — N183 Chronic kidney disease, stage 3 (moderate): Secondary | ICD-10-CM | POA: Diagnosis not present

## 2016-04-22 DIAGNOSIS — I6200 Nontraumatic subdural hemorrhage, unspecified: Secondary | ICD-10-CM | POA: Diagnosis not present

## 2016-04-22 DIAGNOSIS — M1990 Unspecified osteoarthritis, unspecified site: Secondary | ICD-10-CM | POA: Diagnosis not present

## 2016-04-22 DIAGNOSIS — J690 Pneumonitis due to inhalation of food and vomit: Secondary | ICD-10-CM | POA: Diagnosis not present

## 2016-04-22 DIAGNOSIS — I6203 Nontraumatic chronic subdural hemorrhage: Secondary | ICD-10-CM | POA: Diagnosis not present

## 2016-04-22 DIAGNOSIS — K5712 Diverticulitis of small intestine without perforation or abscess without bleeding: Secondary | ICD-10-CM | POA: Diagnosis not present

## 2016-04-22 DIAGNOSIS — Z7982 Long term (current) use of aspirin: Secondary | ICD-10-CM | POA: Diagnosis not present

## 2016-04-22 DIAGNOSIS — G9340 Encephalopathy, unspecified: Secondary | ICD-10-CM | POA: Diagnosis not present

## 2016-04-22 DIAGNOSIS — K5700 Diverticulitis of small intestine with perforation and abscess without bleeding: Secondary | ICD-10-CM | POA: Diagnosis not present

## 2016-04-22 DIAGNOSIS — F0781 Postconcussional syndrome: Secondary | ICD-10-CM | POA: Diagnosis not present

## 2016-04-22 DIAGNOSIS — R1311 Dysphagia, oral phase: Secondary | ICD-10-CM | POA: Diagnosis not present

## 2016-04-22 DIAGNOSIS — I10 Essential (primary) hypertension: Secondary | ICD-10-CM | POA: Diagnosis not present

## 2016-04-22 DIAGNOSIS — R1310 Dysphagia, unspecified: Secondary | ICD-10-CM | POA: Diagnosis not present

## 2016-04-22 DIAGNOSIS — E1140 Type 2 diabetes mellitus with diabetic neuropathy, unspecified: Secondary | ICD-10-CM | POA: Diagnosis not present

## 2016-04-22 DIAGNOSIS — K9423 Gastrostomy malfunction: Secondary | ICD-10-CM | POA: Diagnosis not present

## 2016-04-22 DIAGNOSIS — R1319 Other dysphagia: Secondary | ICD-10-CM | POA: Diagnosis not present

## 2016-04-22 DIAGNOSIS — R627 Adult failure to thrive: Secondary | ICD-10-CM | POA: Diagnosis not present

## 2016-04-22 DIAGNOSIS — E039 Hypothyroidism, unspecified: Secondary | ICD-10-CM | POA: Diagnosis not present

## 2016-04-22 DIAGNOSIS — Z79899 Other long term (current) drug therapy: Secondary | ICD-10-CM | POA: Diagnosis not present

## 2016-04-22 DIAGNOSIS — I2510 Atherosclerotic heart disease of native coronary artery without angina pectoris: Secondary | ICD-10-CM | POA: Diagnosis not present

## 2016-04-22 DIAGNOSIS — B952 Enterococcus as the cause of diseases classified elsewhere: Secondary | ICD-10-CM | POA: Diagnosis not present

## 2016-04-22 DIAGNOSIS — K9429 Other complications of gastrostomy: Secondary | ICD-10-CM | POA: Diagnosis not present

## 2016-04-22 DIAGNOSIS — R1312 Dysphagia, oropharyngeal phase: Secondary | ICD-10-CM | POA: Diagnosis not present

## 2016-04-22 DIAGNOSIS — F0390 Unspecified dementia without behavioral disturbance: Secondary | ICD-10-CM | POA: Diagnosis not present

## 2016-04-22 DIAGNOSIS — E119 Type 2 diabetes mellitus without complications: Secondary | ICD-10-CM | POA: Diagnosis not present

## 2016-04-22 DIAGNOSIS — J189 Pneumonia, unspecified organism: Secondary | ICD-10-CM | POA: Diagnosis not present

## 2016-04-22 DIAGNOSIS — I639 Cerebral infarction, unspecified: Secondary | ICD-10-CM | POA: Diagnosis not present

## 2016-04-22 DIAGNOSIS — I69391 Dysphagia following cerebral infarction: Secondary | ICD-10-CM | POA: Diagnosis not present

## 2016-04-22 DIAGNOSIS — E43 Unspecified severe protein-calorie malnutrition: Secondary | ICD-10-CM | POA: Diagnosis not present

## 2016-04-22 DIAGNOSIS — Z4682 Encounter for fitting and adjustment of non-vascular catheter: Secondary | ICD-10-CM | POA: Diagnosis not present

## 2016-04-22 DIAGNOSIS — N390 Urinary tract infection, site not specified: Secondary | ICD-10-CM | POA: Diagnosis not present

## 2016-04-22 DIAGNOSIS — M6281 Muscle weakness (generalized): Secondary | ICD-10-CM | POA: Diagnosis not present

## 2016-04-22 DIAGNOSIS — R278 Other lack of coordination: Secondary | ICD-10-CM | POA: Diagnosis not present

## 2016-04-22 DIAGNOSIS — R633 Feeding difficulties: Secondary | ICD-10-CM | POA: Diagnosis not present

## 2016-04-23 ENCOUNTER — Encounter (HOSPITAL_COMMUNITY)
Admission: RE | Admit: 2016-04-23 | Discharge: 2016-04-23 | Disposition: A | Discharge status: Home / Self Care | Payer: Medicare Other | Source: Skilled Nursing Facility | Attending: Pulmonary Disease | Admitting: Pulmonary Disease

## 2016-04-23 DIAGNOSIS — N183 Chronic kidney disease, stage 3 (moderate): Secondary | ICD-10-CM | POA: Insufficient documentation

## 2016-04-23 DIAGNOSIS — I10 Essential (primary) hypertension: Secondary | ICD-10-CM | POA: Insufficient documentation

## 2016-04-23 DIAGNOSIS — I2510 Atherosclerotic heart disease of native coronary artery without angina pectoris: Secondary | ICD-10-CM | POA: Insufficient documentation

## 2016-04-23 DIAGNOSIS — M1990 Unspecified osteoarthritis, unspecified site: Secondary | ICD-10-CM | POA: Insufficient documentation

## 2016-04-23 DIAGNOSIS — R1310 Dysphagia, unspecified: Secondary | ICD-10-CM | POA: Insufficient documentation

## 2016-04-23 LAB — COMPREHENSIVE METABOLIC PANEL
ALK PHOS: 77 U/L (ref 38–126)
ALT: 37 U/L (ref 14–54)
AST: 39 U/L (ref 15–41)
Albumin: 3.2 g/dL — ABNORMAL LOW (ref 3.5–5.0)
Anion gap: 11 (ref 5–15)
BILIRUBIN TOTAL: 0.5 mg/dL (ref 0.3–1.2)
BUN: 38 mg/dL — ABNORMAL HIGH (ref 6–20)
CALCIUM: 11.5 mg/dL — AB (ref 8.9–10.3)
CO2: 24 mmol/L (ref 22–32)
CREATININE: 1.72 mg/dL — AB (ref 0.44–1.00)
Chloride: 100 mmol/L — ABNORMAL LOW (ref 101–111)
GFR, EST AFRICAN AMERICAN: 30 mL/min — AB (ref >60)
GFR, EST NON AFRICAN AMERICAN: 26 mL/min — AB (ref >60)
Glucose, Bld: 148 mg/dL — ABNORMAL HIGH (ref 65–99)
Potassium: 4 mmol/L (ref 3.5–5.1)
Sodium: 135 mmol/L (ref 135–145)
TOTAL PROTEIN: 6.6 g/dL (ref 6.5–8.1)

## 2016-04-23 LAB — CBC WITH DIFFERENTIAL/PLATELET
BASOS PCT: 1 %
Basophils Absolute: 0.1 10*3/uL (ref 0.0–0.1)
EOS PCT: 5 %
Eosinophils Absolute: 0.7 10*3/uL (ref 0.0–0.7)
HCT: 28.8 % — ABNORMAL LOW (ref 36.0–46.0)
Hemoglobin: 9.5 g/dL — ABNORMAL LOW (ref 12.0–15.0)
Lymphocytes Relative: 20 %
Lymphs Abs: 2.7 10*3/uL (ref 0.7–4.0)
MCH: 31.3 pg (ref 26.0–34.0)
MCHC: 33 g/dL (ref 30.0–36.0)
MCV: 94.7 fL (ref 78.0–100.0)
MONO ABS: 1.4 10*3/uL — AB (ref 0.1–1.0)
Monocytes Relative: 10 %
NEUTROS PCT: 64 %
Neutro Abs: 8.7 10*3/uL — ABNORMAL HIGH (ref 1.7–7.7)
PLATELETS: 391 10*3/uL (ref 150–400)
RBC: 3.04 MIL/uL — ABNORMAL LOW (ref 3.87–5.11)
RDW: 16.1 % — ABNORMAL HIGH (ref 11.5–15.5)
WBC: 13.6 10*3/uL — ABNORMAL HIGH (ref 4.0–10.5)

## 2016-04-23 LAB — MAGNESIUM: MAGNESIUM: 2.5 mg/dL — AB (ref 1.7–2.4)

## 2016-04-28 ENCOUNTER — Other Ambulatory Visit (HOSPITAL_COMMUNITY): Payer: Self-pay | Admitting: Specialist

## 2016-04-28 ENCOUNTER — Other Ambulatory Visit (HOSPITAL_COMMUNITY): Payer: Self-pay | Admitting: Pulmonary Disease

## 2016-04-28 DIAGNOSIS — R1319 Other dysphagia: Secondary | ICD-10-CM

## 2016-04-29 ENCOUNTER — Ambulatory Visit (HOSPITAL_COMMUNITY): Payer: Medicare Other | Attending: Pulmonary Disease | Admitting: Speech Pathology

## 2016-04-29 ENCOUNTER — Ambulatory Visit (HOSPITAL_COMMUNITY)
Admit: 2016-04-29 | Discharge: 2016-04-29 | Disposition: A | Discharge status: Home / Self Care | Payer: No Typology Code available for payment source | Attending: Pulmonary Disease | Admitting: Pulmonary Disease

## 2016-04-29 ENCOUNTER — Encounter (HOSPITAL_COMMUNITY): Payer: Self-pay | Admitting: Speech Pathology

## 2016-04-29 DIAGNOSIS — R1311 Dysphagia, oral phase: Secondary | ICD-10-CM | POA: Insufficient documentation

## 2016-04-29 DIAGNOSIS — R1319 Other dysphagia: Secondary | ICD-10-CM | POA: Diagnosis not present

## 2016-04-29 DIAGNOSIS — R1310 Dysphagia, unspecified: Secondary | ICD-10-CM | POA: Diagnosis not present

## 2016-04-29 DIAGNOSIS — R633 Feeding difficulties: Secondary | ICD-10-CM | POA: Diagnosis not present

## 2016-04-29 DIAGNOSIS — R1312 Dysphagia, oropharyngeal phase: Secondary | ICD-10-CM | POA: Insufficient documentation

## 2016-04-29 NOTE — Therapy (Signed)
[place], [place], [address] Phone: [phone]   Fax:  [phone]  Modified Barium Swallow  Patient Details  Name: Regina Bush MRN: [id number] Date of Birth: 12-05-1927 No Data Recorded  Encounter Date: 04/29/2016      End of Session - 04/29/16 2208    Visit Number 1   Number of Visits 1   Authorization Type Medicare   SLP Start Time 7353   SLP Stop Time  2992   SLP Time Calculation (min) 43 min   Activity Tolerance Patient tolerated treatment well;Patient limited by fatigue      History reviewed. No pertinent past medical history.  History reviewed. No pertinent surgical history.  There were no vitals filed for this visit.      Subjective Assessment - 04/29/16 2148    Subjective "I like coffee."   Patient is accompained by: Family member   Special Tests MBSS   Currently in Pain? No/denies             General - 04/29/16 2149      General Information   Date of Onset 03/29/16  approximate   HPI [name] is an 83 yo woman who is currently at [facility] for rehab following an extensive hospitalization in [place], [place]. Although there is no information in EPIC, background information was obtained by daughter and treating SLP. Pt was living with her husband and walking 1/2 mile every day prior to her hospitalization in November or UTI. Pt ended up with PEG tube due to failure to thrive and lethargy about a month ago. She was moved from [place] to [place] to be closer to her daughter and son-in-law Karie Kirks, MD). Dr. Luan Pulling requested MBSS to objectively evaluate swallow function.   Type of Study MBS-Modified Barium Swallow Study   Previous Swallow Assessment None on record   Diet Prior to this Study NPO;PEG tube   Temperature Spikes Noted No   Respiratory Status Room air   History of Recent Intubation No   Behavior/Cognition Alert;Pleasant mood;Requires cueing   Oral Cavity Assessment  Within Functional Limits   Oral Care Completed by SLP No   Oral Cavity - Dentition Adequate natural dentition   Vision Functional for self feeding   Self-Feeding Abilities Needs assist   Patient Positioning Upright in chair   Baseline Vocal Quality Normal   Volitional Cough Strong   Volitional Swallow Able to elicit   Anatomy Within functional limits   Pharyngeal Secretions Not observed secondary MBS            Oral Preparation/Oral Phase - 04/29/16 2155      Oral Preparation/Oral Phase   Oral Phase Impaired     Oral - Nectar   Oral - Nectar Teaspoon Weak ligual manipulation;Decreased bolus cohesion;Delayed A-P transit;Oral residue   Oral - Nectar Cup Bilateral anterior bolus loss     Oral - Thin   Oral - Thin Teaspoon Weak ligual manipulation;Delayed A-P transit   Oral - Thin Cup Weak ligual manipulation;Delayed A-P transit;Bilateral anterior bolus loss     Oral - Solids   Oral - Puree Weak ligual manipulation;Decreased bolus cohesion;Delayed A-P transit;Absent A-P transit;Oral residue   Oral - Regular Weak ligual manipulation;Delayed A-P transit;Absent A-P transit;Oral residue;Piecemeal swallowing  oral transit aided with liquid wash   Oral - Pill Not tested     Electrical stimulation - Oral Phase   Was Electrical Stimulation Used No  Pharyngeal Phase - 05/06/2016 06/12/2199      Pharyngeal Phase   Pharyngeal Phase Impaired     Pharyngeal - Nectar   Pharyngeal- Nectar Teaspoon Swallow initiation at vallecula;Delayed swallow initiation   Pharyngeal- Nectar Cup --  anterior labial spillage   Pharyngeal- Nectar Straw Swallow initiation at vallecula;Penetration/Aspiration during swallow   Pharyngeal Material does not enter airway;Material enters airway, remains ABOVE vocal cords then ejected out     Pharyngeal - Thin   Pharyngeal- Thin Teaspoon Swallow initiation at vallecula  oral spillage and delays   Pharyngeal- Thin Cup --  oral labial spillage, decreased  bolus cohesiveness   Pharyngeal- Thin Straw Delayed swallow initiation;Swallow initiation at pyriform sinus;Penetration/Aspiration before swallow;Pharyngeal residue - valleculae   Pharyngeal Material enters airway, CONTACTS cords and then ejected out;Material enters airway, remains ABOVE vocal cords and not ejected out     Pharyngeal - Solids   Pharyngeal- Puree Within functional limits  required liquid wash to move from oral cavity   Pharyngeal- Regular Within functional limits  limited assessment due to oral deficits     Electrical Stimulation - Pharyngeal Phase   Was Electrical Stimulation Used No          Cricopharyngeal Phase - 05/06/2016 June 12, 2201      Cervical Esophageal Phase   Cervical Esophageal Phase Within functional limits  difficult to visualize due to shoulders           Plan - 2016-05-06 June 13, 2207    Clinical Impression Statement Pt presents with moderate oral phase dysphagia and min pharyngeal phase dysphagia negatively impacted today by lethargy and altered mental status. Pt required significant verbal and tactile cueing to remain engaged in assessment. Overall assessment was somewhat limited due to lethargy and severity of oral difficulties today (this is reportedly atypical behavior for this week). Pt assessed with teaspoon presentations of thin and NTL, cup of thin and NTL (largely ineffective due to anterior labial spillage), straw sips thin and NTL, puree, and regular textures. Pt did best with teaspoon presentations of NTL as this allowed for increased bolus cohesiveness in both oral and pharyngeal phase of swallow, followed by straw sips of NTL and teaspoon presentations of thin. Cup sips were ineffective across consistencies. Pt demonstrated premature spillage with delay in swallow initiation which resulted in penetration BEFORE the swallow of straw sips thin to the level of the cords x1 without complete expulsion (remained in laryngeal vestibule) and cued cough as  ineffective (pt not following commands for this). Pt with penetration of straw sips NTL DURING the swallow in trace amounts. Pt required liquid bolus wash to assist with oral bolus transit of puree and regular textures (poor labial closure, mastication, and lingual manipulation). Recommend continued use of PEG with po trials when alert and upright with treating SLP of D2 and NTL via teaspoon or straw; consider trials of thin water after oral care and upgrade clinically as oral swallow improves. Pt was given a sip of water after completion of study which pt readily accepted and resulted in coughing episode due to Pt taking large sips.      Patient will benefit from skilled therapeutic intervention in order to improve the following deficits and impairments:   Dysphagia, oropharyngeal phase      G-Codes - May 06, 2016 2213/06/12    Functional Assessment Tool Used MBSS; clinical judgment   Functional Limitations Swallowing   Swallow Current Status (M8413) At least 40 percent but less than 60 percent impaired, limited or restricted   Swallow  Goal Status [phone]) At least 40 percent but less than 60 percent impaired, limited or restricted   Swallow Discharge Status [phone]) At least 40 percent but less than 60 percent impaired, limited or restricted          Recommendations/Treatment - 04/29/16 2204      Swallow Evaluation Recommendations   SLP Diet Recommendations Nectar;Thin;Dysphagia 2 (chopped)  trials with SLP first and upgrade as clinically appropriate   Liquid Administration via Spoon;Straw;Cup   Medication Administration Via alternative means   Supervision Full assist for feeding;Full supervision/cueing for compensatory strategies   Compensations Slow rate;Lingual sweep for clearance of pocketing;Multiple dry swallows after each bite/sip;Follow solids with liquid   Postural Changes Seated upright at 90 degrees;Remain upright for at least 30 minutes after feeds/meals          Prognosis -  04/29/16 2207      Prognosis   Prognosis for Safe Diet Advancement Fair   Barriers to Reach Goals Motivation     Individuals Consulted   Consulted and Agree with Results and Recommendations Family member/caregiver   Family Member Consulted daughter   Report Sent to  Referring physician;Facility (Comment)  [facility]      Problem List There are no active problems to display for this patient.  Thank you,  Genene Churn, [place]  [facility] 04/29/2016, 10:47 PM  [facility] [address] [place], [place], [address] Phone: [phone]   Fax:  [phone]  Name: Regina Bush MRN: [id number] Date of Birth: May 23, 1927

## 2016-05-01 ENCOUNTER — Encounter (HOSPITAL_COMMUNITY)
Admission: RE | Admit: 2016-05-01 | Discharge: 2016-05-01 | Disposition: A | Discharge status: Home / Self Care | Payer: Medicare Other | Attending: Pulmonary Disease | Admitting: Pulmonary Disease

## 2016-05-01 DIAGNOSIS — K5712 Diverticulitis of small intestine without perforation or abscess without bleeding: Secondary | ICD-10-CM | POA: Diagnosis not present

## 2016-05-01 DIAGNOSIS — D649 Anemia, unspecified: Secondary | ICD-10-CM | POA: Diagnosis not present

## 2016-05-01 DIAGNOSIS — N390 Urinary tract infection, site not specified: Secondary | ICD-10-CM | POA: Diagnosis not present

## 2016-05-01 LAB — URINALYSIS, ROUTINE W REFLEX MICROSCOPIC
Bilirubin Urine: NEGATIVE
Glucose, UA: 50 mg/dL — AB
Hgb urine dipstick: NEGATIVE
Ketones, ur: NEGATIVE mg/dL
Nitrite: NEGATIVE
Protein, ur: 30 mg/dL — AB
SPECIFIC GRAVITY, URINE: 1.012 (ref 1.005–1.030)
pH: 7 (ref 5.0–8.0)

## 2016-05-01 LAB — INFLUENZA PANEL BY PCR (TYPE A & B)
Influenza A By PCR: NEGATIVE
Influenza B By PCR: NEGATIVE

## 2016-05-03 ENCOUNTER — Emergency Department (HOSPITAL_COMMUNITY)
Admission: EM | Admit: 2016-05-03 | Discharge: 2016-05-04 | Disposition: A | Discharge status: Home / Self Care | Payer: Medicare Other | Attending: Emergency Medicine | Admitting: Emergency Medicine

## 2016-05-03 ENCOUNTER — Emergency Department (HOSPITAL_COMMUNITY)
Admission: EM | Admit: 2016-05-03 | Discharge: 2016-05-03 | Disposition: A | Discharge status: Home / Self Care | Payer: Medicare Other | Source: Home / Self Care | Attending: Emergency Medicine | Admitting: Emergency Medicine

## 2016-05-03 ENCOUNTER — Emergency Department (HOSPITAL_COMMUNITY): Payer: Medicare Other

## 2016-05-03 ENCOUNTER — Encounter (HOSPITAL_COMMUNITY): Payer: Self-pay

## 2016-05-03 DIAGNOSIS — T85598A Other mechanical complication of other gastrointestinal prosthetic devices, implants and grafts, initial encounter: Secondary | ICD-10-CM | POA: Insufficient documentation

## 2016-05-03 DIAGNOSIS — Z789 Other specified health status: Secondary | ICD-10-CM

## 2016-05-03 DIAGNOSIS — Z79899 Other long term (current) drug therapy: Secondary | ICD-10-CM

## 2016-05-03 DIAGNOSIS — Z7982 Long term (current) use of aspirin: Secondary | ICD-10-CM | POA: Insufficient documentation

## 2016-05-03 DIAGNOSIS — I2510 Atherosclerotic heart disease of native coronary artery without angina pectoris: Secondary | ICD-10-CM | POA: Insufficient documentation

## 2016-05-03 DIAGNOSIS — I10 Essential (primary) hypertension: Secondary | ICD-10-CM | POA: Insufficient documentation

## 2016-05-03 DIAGNOSIS — Y829 Unspecified medical devices associated with adverse incidents: Secondary | ICD-10-CM

## 2016-05-03 DIAGNOSIS — F0390 Unspecified dementia without behavioral disturbance: Secondary | ICD-10-CM | POA: Insufficient documentation

## 2016-05-03 DIAGNOSIS — E119 Type 2 diabetes mellitus without complications: Secondary | ICD-10-CM

## 2016-05-03 DIAGNOSIS — K9423 Gastrostomy malfunction: Secondary | ICD-10-CM | POA: Insufficient documentation

## 2016-05-03 DIAGNOSIS — Z431 Encounter for attention to gastrostomy: Secondary | ICD-10-CM | POA: Diagnosis not present

## 2016-05-03 DIAGNOSIS — Z4682 Encounter for fitting and adjustment of non-vascular catheter: Secondary | ICD-10-CM | POA: Diagnosis not present

## 2016-05-03 DIAGNOSIS — K9429 Other complications of gastrostomy: Secondary | ICD-10-CM | POA: Diagnosis not present

## 2016-05-03 DIAGNOSIS — K9420 Gastrostomy complication, unspecified: Secondary | ICD-10-CM

## 2016-05-03 HISTORY — DX: Failure to thrive (child): R62.51

## 2016-05-03 HISTORY — DX: Disorder of thyroid, unspecified: E07.9

## 2016-05-03 HISTORY — DX: Essential (primary) hypertension: I10

## 2016-05-03 HISTORY — DX: Hypercalcemia: E83.52

## 2016-05-03 HISTORY — DX: Unspecified dementia, unspecified severity, without behavioral disturbance, psychotic disturbance, mood disturbance, and anxiety: F03.90

## 2016-05-03 HISTORY — DX: Atherosclerotic heart disease of native coronary artery without angina pectoris: I25.10

## 2016-05-03 HISTORY — DX: Other disorders of lung: J98.4

## 2016-05-03 HISTORY — DX: Cerebral infarction, unspecified: I63.9

## 2016-05-03 HISTORY — DX: Anemia, unspecified: D64.9

## 2016-05-03 HISTORY — DX: Type 2 diabetes mellitus without complications: E11.9

## 2016-05-03 HISTORY — DX: Traumatic subdural hemorrhage with loss of consciousness of unspecified duration, initial encounter: S06.5X9A

## 2016-05-03 HISTORY — DX: Unspecified protein-calorie malnutrition: E46

## 2016-05-03 HISTORY — DX: Traumatic subdural hemorrhage with loss of consciousness status unknown, initial encounter: S06.5XAA

## 2016-05-03 HISTORY — DX: Dysphagia following cerebral infarction: I69.391

## 2016-05-03 HISTORY — DX: Unspecified osteoarthritis, unspecified site: M19.90

## 2016-05-03 HISTORY — DX: Pneumonia, unspecified organism: J18.9

## 2016-05-03 MED ORDER — IOPAMIDOL (ISOVUE-300) INJECTION 61%
INTRAVENOUS | Status: AC
  Administered 2016-05-03: 35 mL
  Filled 2016-05-03: qty 50

## 2016-05-03 NOTE — ED Triage Notes (Addendum)
Pt from [facility] with complaint of gastrectomy tube pulled out. Pt is NPO

## 2016-05-03 NOTE — ED Triage Notes (Signed)
Patient was here today for Gastric Tube replacement, discharged to [facility] where she pulled her Tube out again.  Send back to have Tube replaced per Dr. Willey Blade.

## 2016-05-03 NOTE — ED Provider Notes (Signed)
[facility] DEPT Provider Note   CSN: [id number] Arrival date & time: 05/03/16  2228  By signing my name below, I, Oleh Genin, attest that this documentation has been prepared under the direction and in the presence of Ripley Fraise, MD. Electronically Signed: Oleh Genin, Scribe. 05/03/16. 11:15 PM.   History   Chief Complaint Chief Complaint  Patient presents with  . Gastric Tube Replacement   LEVEL 5 CAVEAT DUE TO DEMENTIA  HPI Regina Bush is a 83 y.o. female with history of CVA, dementia, and diabetes who presents to the ED for a G-tube problem. This patient was initailly seen at this facility this morning after nursing staff at her living facility believe she pulled it out. She was inserted with a G-tube, placed in a pelvic binder, and discharged. She re-presents tonight after nursing staff state she pulled out her G-tube again.   A complete history is limited by dementia.  HPI  Past Medical History:  Diagnosis Date  . Anemia   . Arthritis   . Cerebral infarction ([place])   . Coronary artery disease   . Dementia   . Diabetes mellitus without complication (HCC)    Type 2  . Dysphagia due to recent cerebral infarction   . Failure to thrive (0-17)   . Hypercalcemia   . Hypertension   . Pneumonitis   . Protein calorie malnutrition ([place])   . Subdural hematoma ([place])   . Thyroid disease     There are no active problems to display for this patient.   History reviewed. No pertinent surgical history.  OB History    No data available       Home Medications    Prior to Admission medications   Medication Sig Start Date End Date Taking? Authorizing Provider  amLODipine (NORVASC) 10 MG tablet Place 10 mg into feeding tube daily.    Historical Provider, MD  aspirin EC 81 MG tablet Take 81 mg by mouth daily.    Historical Provider, MD  atorvastatin (LIPITOR) 20 MG tablet Place 20 mg into feeding tube daily.    Historical Provider, MD  cholecalciferol  (VITAMIN D) 1000 units tablet Place 1,000 Units into feeding tube daily.    Historical Provider, MD  ciprofloxacin (CIPRO) 250 MG tablet Place 250 mg into feeding tube 2 (two) times daily.    Historical Provider, MD  famotidine (PEPCID) 20 MG tablet Place 20 mg into feeding tube daily.    Historical Provider, MD  hydrALAZINE (APRESOLINE) 25 MG tablet Place 25 mg into feeding tube 3 (three) times daily.    Historical Provider, MD  levothyroxine (SYNTHROID, LEVOTHROID) 50 MCG tablet Place 50 mcg into feeding tube daily before breakfast.    Historical Provider, MD  magnesium oxide (MAG-OX) 400 MG tablet Place 400 mg into feeding tube daily.    Historical Provider, MD  Multiple Vitamin (MULTIVITAMIN WITH MINERALS) TABS tablet Place 1 tablet into feeding tube daily.    Historical Provider, MD  Wheat Dextrin (BENEFIBER PO) 10 mLs by Gastric Tube route daily.    Historical Provider, MD    Family History No family history on file.  Social History Social History  Substance Use Topics  . Smoking status: Unknown If Ever Smoked  . Smokeless tobacco: Never Used  . Alcohol use Not on file     Allergies   Cymbalta [duloxetine hcl]; Effexor [venlafaxine]; Morphine and related; and Tape   Review of Systems Review of Systems  Unable to perform ROS: Dementia  Physical Exam Updated Vital Signs BP (!) 124/46 (BP Location: Right Arm)   Pulse 65   Temp 97.7 F (36.5 C) (Oral)   Resp 20   SpO2 99%   Physical Exam CONSTITUTIONAL: Elderly and frail HEAD: Normocephalic/atraumatic ENMT: Mucous membranes dry NECK: supple no meningeal signs CV: S1/S2 noted, no murmurs/rubs/gallops noted LUNGS: Patient yelling throughout exam ABDOMEN: soft, nontender gastrotomy stem noted, no surrounding erythema NEURO: Patient is sleeping but arousible and becomes agitated during exam.   EXTREMITIES: pulses normal/equal, full ROM SKIN: warm, color normal  ED Treatments / Results  DIAGNOSTIC  STUDIES: Oxygen Saturation is 99 percent on room air which is normal by my interpretation.    COORDINATION OF CARE: 11:10 PM Discussed treatment plan with pt at bedside and pt agreed to plan.  Labs (all labs ordered are listed, but only abnormal results are displayed) Labs Reviewed - No data to display  EKG  EKG Interpretation None       Radiology Dg Abdomen Peg Tube Location  Result Date: 05/04/2016 CLINICAL DATA:  Confirm G-tube placement.  Initial encounter. EXAM: ABDOMEN - 1 VIEW COMPARISON:  G-tube placement images performed 05/03/2016 FINDINGS: Injection of contrast into the patient's G-tube demonstrates filling of the stomach as expected. The G-tube balloon appears to be at the fundus of the stomach, with trace reflux across the gastroesophageal junction. Keeping the G-tube external catheter positioned superior to the stoma may help the balloon move back down to the body of the stomach. Contrast is seen within small and large bowel loops. The visualized bowel gas pattern is grossly unremarkable. No free intra-abdominal air is seen, though evaluation for free air is limited on a single supine view. Mild degenerative change is noted along the lower thoracic spine. No acute osseous abnormalities are seen. IMPRESSION: Filling of the stomach with contrast, as expected. The G-tube balloon appears to be at the fundus of the stomach, with trace reflux across the gastroesophageal junction. Keeping the G-tube external catheter positioned superior to the stoma may help the balloon move back down to the body of the stomach. Electronically Signed   By: Garald Balding M.D.   On: 05/04/2016 02:02   Dg Cm Inj Any Colonic Tube W/fluoro  Result Date: 05/03/2016 INDICATION: Replaced gastrostomy tube EXAM: CM INJECTION ANY COLONIC TUBE WITH FL MEDICATIONS: None ANESTHESIA/SEDATION: None utilized CONTRAST:  ISOVUE-300 IOPAMIDOL (ISOVUE-300) INJECTION 61% - 35 mL - administered into the gastric lumen.  FLUOROSCOPY TIME:  Fluoroscopy Time: 0 minutes minutes 18 seconds Dose:  (4.0 mGy). COMPLICATIONS: None immediate. PROCEDURE: 35 mL of Isovue-300 was injected into the gastrostomy tube. Contrast opacifies the gastric lumen. No contrast extravasation seen. Contrast passes from the stomach into the duodenum. IMPRESSION: Replaced gastrostomy tube is within the gastric lumen without evidence of extravasation or gastric outlet obstruction. Electronically Signed   By: Lavonia Dana M.D.   On: 05/03/2016 12:55    Procedures Gastrostomy tube replacement Date/Time: 05/04/2016 1:30 AM Performed by: Ripley Fraise Authorized by: Ripley Fraise  Consent: Verbal consent not obtained. Preparation: Patient was prepped and draped in the usual sterile fashion. Local anesthesia used: no  Anesthesia: Local anesthesia used: no  Sedation: Patient sedated: no Patient tolerance: Patient tolerated the procedure well with no immediate complications    (including critical care time)  Medications Ordered in ED Medications - No data to display   Initial Impression / Assessment and Plan / ED Course  I have reviewed the triage vital signs and the nursing notes.  Pertinent  imaging results that were available during my care of the patient were reviewed by me and considered in my medical decision making (see chart for details).     Pt just seen in the ED on 2/19 for gtube replacement She pulled it out again I was unable to find a similar size gtube in the ED (20 FR) I attempted to place 20 FR foley without success I then placed a 16 french foley without difficulty, pt tolerated well and xray appropriate D/c back to nursing home and will need to have foley replaced within next 24 hrs   Final Clinical Impressions(s) / ED Diagnoses   Final diagnoses:  Gastrostomy tube dysfunction ([place])    New Prescriptions New Prescriptions   No medications on file  I personally performed the services described in  this documentation, which was scribed in my presence. The recorded information has been reviewed and is accurate.       Ripley Fraise, MD 05/04/16 [phone]

## 2016-05-03 NOTE — ED Provider Notes (Signed)
[facility] DEPT Provider Note   CSN: [id number] Arrival date & time: 05/03/16  0841     History   Chief Complaint Chief Complaint  Patient presents with  . removal of G tube    HPI Regina Bush is a 83 y.o. female.  Patient with PMH of stroke, dementia, DM, presents to the ED with a chief complaint of g-tube problem.  She is unable to contribute to hx 2/2 dementia.  Level 5 caveat applies.     The history is provided by the patient. No language interpreter was used.    Past Medical History:  Diagnosis Date  . Anemia   . Arthritis   . Cerebral infarction ([place])   . Coronary artery disease   . Dementia   . Diabetes mellitus without complication (HCC)    Type 2  . Dysphagia due to recent cerebral infarction   . Failure to thrive (0-17)   . Hypercalcemia   . Hypertension   . Pneumonitis   . Protein calorie malnutrition ([place])   . Subdural hematoma ([place])   . Thyroid disease     There are no active problems to display for this patient.   No past surgical history on file.  OB History    No data available       Home Medications    Prior to Admission medications   Not on File    Family History No family history on file.  Social History Social History  Substance Use Topics  . Smoking status: Not on file  . Smokeless tobacco: Not on file  . Alcohol use Not on file     Allergies   Cymbalta [duloxetine hcl]; Effexor [venlafaxine]; Morphine and related; and Tape   Review of Systems Review of Systems  Unable to perform ROS: Dementia     Physical Exam Updated Vital Signs BP (!) 132/35   Pulse (!) 55   Temp 97.5 F (36.4 C) (Tympanic)   Resp 16   Ht 5\' 6"  (1.676 m)   Wt 59 kg   SpO2 94%   BMI 20.98 kg/m   Physical Exam  Constitutional: She appears well-developed and well-nourished.  HENT:  Head: Normocephalic and atraumatic.  Eyes: Conjunctivae and EOM are normal. Pupils are equal, round, and reactive to light.  Neck: Normal  range of motion. Neck supple.  Cardiovascular: Normal rate and regular rhythm.  Exam reveals no gallop and no friction rub.   No murmur heard. Pulmonary/Chest: Effort normal and breath sounds normal. No respiratory distress. She has no wheezes. She has no rales. She exhibits no tenderness.  Abdominal: Soft. Bowel sounds are normal. She exhibits no distension and no mass. There is no tenderness. There is no rebound and no guarding.  Musculoskeletal: Normal range of motion. She exhibits no edema or tenderness.  Neurological: She is alert.  Awake, demented, unable to contribute  Skin: Skin is warm and dry.  Psychiatric: She has a normal mood and affect. Her behavior is normal. Judgment and thought content normal.  Nursing note and vitals reviewed.    ED Treatments / Results  Labs (all labs ordered are listed, but only abnormal results are displayed) Labs Reviewed - No data to display  EKG  EKG Interpretation None       Radiology Dg Cm Inj Any Colonic Tube W/fluoro  Result Date: 05/03/2016 INDICATION: Replaced gastrostomy tube EXAM: CM INJECTION ANY COLONIC TUBE WITH FL MEDICATIONS: None ANESTHESIA/SEDATION: None utilized CONTRAST:  ISOVUE-300 IOPAMIDOL (ISOVUE-300) INJECTION 61% - 35  mL - administered into the gastric lumen. FLUOROSCOPY TIME:  Fluoroscopy Time: 0 minutes minutes 18 seconds Dose:  (4.0 mGy). COMPLICATIONS: None immediate. PROCEDURE: 35 mL of Isovue-300 was injected into the gastrostomy tube. Contrast opacifies the gastric lumen. No contrast extravasation seen. Contrast passes from the stomach into the duodenum. IMPRESSION: Replaced gastrostomy tube is within the gastric lumen without evidence of extravasation or gastric outlet obstruction. Electronically Signed   By: Lavonia Dana M.D.   On: 05/03/2016 12:55    Procedures Gastrostomy tube replacement Date/Time: 05/03/2016 1:16 PM Performed by: Montine Circle Authorized by: Montine Circle  Consent: The procedure was  performed in an emergent situation. Risks and benefits: risks, benefits and alternatives were discussed Patient understanding: patient states understanding of the procedure being performed Patient consent: the patient's understanding of the procedure matches consent given Procedure consent: procedure consent matches procedure scheduled Relevant documents: relevant documents present and verified Test results: test results available and properly labeled Site marked: the operative site was marked Imaging studies: imaging studies available Required items: required blood products, implants, devices, and special equipment available Preparation: Patient was prepped and draped in the usual sterile fashion. Local anesthesia used: no  Anesthesia: Local anesthesia used: no  Sedation: Patient sedated: no Patient tolerance: Patient tolerated the procedure well with no immediate complications    (including critical care time)  Medications Ordered in ED Medications - No data to display   Initial Impression / Assessment and Plan / ED Course  I have reviewed the triage vital signs and the nursing notes.  Pertinent labs & imaging results that were available during my care of the patient were reviewed by me and considered in my medical decision making (see chart for details).     Patient with dementia.  Pulled out feeding tube last night.  Unclear how long it has been out.  Attempted to contact nursing home, but nobody was transferred to voicemail.  10:18 AM Spoke with Iona Beard, nurse supervisor at [facility], who states that they believe she pulled the g-tube around 6:00 am this morning.  He states that she is at her normal mental baseline.  New tube inserted.  Will check for placement.  1:18 PM Patient seen by and discussed with Dr. Wilson Singer.  Final Clinical Impressions(s) / ED Diagnoses   Final diagnoses:  Complication of gastrostomy tube [facility])    New Prescriptions New Prescriptions    No medications on file     Montine Circle, PA-C 05/03/16 [place], MD 05/09/16 2110

## 2016-05-04 ENCOUNTER — Emergency Department (HOSPITAL_COMMUNITY): Payer: Medicare Other

## 2016-05-04 ENCOUNTER — Emergency Department (HOSPITAL_COMMUNITY)
Admission: EM | Admit: 2016-05-04 | Discharge: 2016-05-04 | Disposition: A | Discharge status: Home / Self Care | Payer: Medicare Other | Source: Home / Self Care | Attending: Emergency Medicine | Admitting: Emergency Medicine

## 2016-05-04 ENCOUNTER — Encounter (HOSPITAL_COMMUNITY): Payer: Self-pay | Admitting: Emergency Medicine

## 2016-05-04 DIAGNOSIS — F0390 Unspecified dementia without behavioral disturbance: Secondary | ICD-10-CM

## 2016-05-04 DIAGNOSIS — I2510 Atherosclerotic heart disease of native coronary artery without angina pectoris: Secondary | ICD-10-CM | POA: Insufficient documentation

## 2016-05-04 DIAGNOSIS — Z7982 Long term (current) use of aspirin: Secondary | ICD-10-CM | POA: Insufficient documentation

## 2016-05-04 DIAGNOSIS — K9423 Gastrostomy malfunction: Secondary | ICD-10-CM

## 2016-05-04 DIAGNOSIS — Z79899 Other long term (current) drug therapy: Secondary | ICD-10-CM

## 2016-05-04 DIAGNOSIS — I10 Essential (primary) hypertension: Secondary | ICD-10-CM

## 2016-05-04 DIAGNOSIS — E119 Type 2 diabetes mellitus without complications: Secondary | ICD-10-CM | POA: Insufficient documentation

## 2016-05-04 LAB — URINE CULTURE

## 2016-05-04 MED ORDER — DIATRIZOATE MEGLUMINE & SODIUM 66-10 % PO SOLN
ORAL | Status: AC
  Administered 2016-05-04: 25 mL
  Filled 2016-05-04: qty 30

## 2016-05-04 MED ORDER — DIATRIZOATE MEGLUMINE & SODIUM 66-10 % PO SOLN
30.0000 mL | Freq: Once | ORAL | Status: AC
  Administered 2016-05-04: 25 mL
  Filled 2016-05-04: qty 30

## 2016-05-04 NOTE — ED Notes (Signed)
Patient had dried stool on her. Patient cleaned at bedside. Patient skin is very red and excoriated in groin area with small opening in the skin on buttocks. Patient cleaned and protective barrier applied. Patient cooperative for this.

## 2016-05-04 NOTE — Discharge Instructions (Signed)
PLEASE KEEP CATHETER TAPED ABOVE THE STOMA

## 2016-05-04 NOTE — ED Notes (Addendum)
EDP to bedside and assessed site. Site flushed by EDP, gastric contents noted in syringe. EDP reported apply dressing to g-tube site and return patient back to [facility].   Site dressed by Cornell Barman with gauze and secured with medipore tape. Pt tolerated well.

## 2016-05-04 NOTE — ED Triage Notes (Signed)
Pt seen for same yesterday. Pt sent back to ED today due to pt "pulling g-tube out".  Foley that was inserted into site on 05/03/16 noted at time of assessment.

## 2016-05-04 NOTE — Discharge Instructions (Signed)
Your tube is in the right place - we have injected and aspirated fluid / stomach contents and there is no signs of dysfunction - please keep the tube well flushed - see your GI doctor in next 7 days for evaluation / may need to be switched with a standard G tube.  Keep the tube taped down well to the abdomen when not in use to prevent Regina Bush from accidentally pulling it out.

## 2016-05-04 NOTE — ED Provider Notes (Signed)
[facility] DEPT Provider Note   CSN: [id number] Arrival date & time: 05/04/16  0945  By signing my name below, I, Collene Leyden, attest that this documentation has been prepared under the direction and in the presence of Noemi Chapel, MD. Electronically Signed: Collene Leyden, Scribe. 05/04/16. 10:06 AM.  History   Chief Complaint Chief Complaint  Patient presents with  . g-tube replacement   LEVEL 5 CAVEAT DUE TO DEMENTIA   HPI Comments: Regina Bush is a 83 y.o. female with a history of dementia, CVA, and diabetes mellitus, who presents to the Emergency Department for the removal of her G-tube. Per nurse, the patient pulled out her G-tube at the nursing facility this morning. Patient was seen here yesterday evening for the same problem. Patient pulled her G-tube completely out yesterday, today the G-tube is still inserted, but slightly pulled out.  The history is provided by the nursing home. No language interpreter was used.    Past Medical History:  Diagnosis Date  . Anemia   . Arthritis   . Cerebral infarction ([place])   . Coronary artery disease   . Dementia   . Diabetes mellitus without complication (HCC)    Type 2  . Dysphagia due to recent cerebral infarction   . Failure to thrive (0-17)   . Hypercalcemia   . Hypertension   . Pneumonitis   . Protein calorie malnutrition ([place])   . Subdural hematoma ([place])   . Thyroid disease     There are no active problems to display for this patient.   No past surgical history on file.  OB History    No data available       Home Medications    Prior to Admission medications   Medication Sig Start Date End Date Taking? Authorizing Provider  amLODipine (NORVASC) 10 MG tablet Place 10 mg into feeding tube daily.    Historical Provider, MD  aspirin EC 81 MG tablet Take 81 mg by mouth daily.    Historical Provider, MD  atorvastatin (LIPITOR) 20 MG tablet Place 20 mg into feeding tube daily.    Historical Provider, MD    cholecalciferol (VITAMIN D) 1000 units tablet Place 1,000 Units into feeding tube daily.    Historical Provider, MD  ciprofloxacin (CIPRO) 250 MG tablet Place 250 mg into feeding tube 2 (two) times daily.    Historical Provider, MD  famotidine (PEPCID) 20 MG tablet Place 20 mg into feeding tube daily.    Historical Provider, MD  hydrALAZINE (APRESOLINE) 25 MG tablet Place 25 mg into feeding tube 3 (three) times daily.    Historical Provider, MD  levothyroxine (SYNTHROID, LEVOTHROID) 50 MCG tablet Place 50 mcg into feeding tube daily before breakfast.    Historical Provider, MD  magnesium oxide (MAG-OX) 400 MG tablet Place 400 mg into feeding tube daily.    Historical Provider, MD  Multiple Vitamin (MULTIVITAMIN WITH MINERALS) TABS tablet Place 1 tablet into feeding tube daily.    Historical Provider, MD  Wheat Dextrin (BENEFIBER PO) 10 mLs by Gastric Tube route daily.    Historical Provider, MD    Family History No family history on file.  Social History Social History  Substance Use Topics  . Smoking status: Unknown If Ever Smoked  . Smokeless tobacco: Never Used  . Alcohol use Not on file     Allergies   Cymbalta [duloxetine hcl]; Effexor [venlafaxine]; Morphine and related; and Tape   Review of Systems Review of Systems  Unable to perform ROS:  Dementia     Physical Exam Updated Vital Signs There were no vitals taken for this visit.  Physical Exam  Constitutional: She appears well-developed.  HENT:  Head: Normocephalic and atraumatic.  Mouth/Throat: Oropharynx is clear and moist.  Eyes: Conjunctivae and EOM are normal. Pupils are equal, round, and reactive to light.  Neck: Normal range of motion. Neck supple.  Cardiovascular: Normal rate and regular rhythm.   Pulmonary/Chest: Effort normal and breath sounds normal.  Abdominal: Soft. Bowel sounds are normal.  Ventral wall hernia noted, non-tender and non-distended. G-tube site is clean with foley in place, well  seated.   Musculoskeletal: Normal range of motion.  Neurological: She is alert.  Skin: Skin is warm and dry.     ED Treatments / Results   COORDINATION OF CARE: 10:06 AM Discussed treatment plan with caregiver at bedside and caregiver agreed to plan, which includes G-tube re-insertion.   Labs (all labs ordered are listed, but only abnormal results are displayed) Labs Reviewed - No data to display  EKG  EKG Interpretation None       Radiology Dg Abdomen Peg Tube Location  Result Date: 05/04/2016 CLINICAL DATA:  Confirm G-tube placement.  Initial encounter. EXAM: ABDOMEN - 1 VIEW COMPARISON:  G-tube placement images performed 05/03/2016 FINDINGS: Injection of contrast into the patient's G-tube demonstrates filling of the stomach as expected. The G-tube balloon appears to be at the fundus of the stomach, with trace reflux across the gastroesophageal junction. Keeping the G-tube external catheter positioned superior to the stoma may help the balloon move back down to the body of the stomach. Contrast is seen within small and large bowel loops. The visualized bowel gas pattern is grossly unremarkable. No free intra-abdominal air is seen, though evaluation for free air is limited on a single supine view. Mild degenerative change is noted along the lower thoracic spine. No acute osseous abnormalities are seen. IMPRESSION: Filling of the stomach with contrast, as expected. The G-tube balloon appears to be at the fundus of the stomach, with trace reflux across the gastroesophageal junction. Keeping the G-tube external catheter positioned superior to the stoma may help the balloon move back down to the body of the stomach. Electronically Signed   By: Garald Balding M.D.   On: 05/04/2016 02:02   Dg Cm Inj Any Colonic Tube W/fluoro  Result Date: 05/03/2016 INDICATION: Replaced gastrostomy tube EXAM: CM INJECTION ANY COLONIC TUBE WITH FL MEDICATIONS: None ANESTHESIA/SEDATION: None utilized CONTRAST:   ISOVUE-300 IOPAMIDOL (ISOVUE-300) INJECTION 61% - 35 mL - administered into the gastric lumen. FLUOROSCOPY TIME:  Fluoroscopy Time: 0 minutes minutes 18 seconds Dose:  (4.0 mGy). COMPLICATIONS: None immediate. PROCEDURE: 35 mL of Isovue-300 was injected into the gastrostomy tube. Contrast opacifies the gastric lumen. No contrast extravasation seen. Contrast passes from the stomach into the duodenum. IMPRESSION: Replaced gastrostomy tube is within the gastric lumen without evidence of extravasation or gastric outlet obstruction. Electronically Signed   By: Lavonia Dana M.D.   On: 05/03/2016 12:55    Procedures Procedures (including critical care time)  Medications Ordered in ED Medications - No data to display   Initial Impression / Assessment and Plan / ED Course  I have reviewed the triage vital signs and the nursing notes.  Pertinent labs & imaging results that were available during my care of the patient were reviewed by me and considered in my medical decision making (see chart for details).    Foley catheter evaluated by myself. Appears in place. Flushed  saline without difficulty and withdrew stomach contents on aspiration. Patient in no distress. Stable for discharge back to facility.    Final Clinical Impressions(s) / ED Diagnoses   Final diagnoses:  Gastrostomy tube dysfunction [facility])    New Prescriptions New Prescriptions   No medications on file   I personally performed the services described in this documentation, which was scribed in my presence. The recorded information has been reviewed and is accurate.       Noemi Chapel, MD 05/04/16 [phone]

## 2016-05-04 NOTE — H&P (Signed)
NAME:  Regina Bush, Regina Bush NO.:  [id number]  MEDICAL RECORD NO.:  [id number]  LOCATION:                                 FACILITY:  PHYSICIAN:  [place] Luan Pulling, M.D.DATE OF BIRTH:  1928-01-16  DATE OF ADMISSION:  04/22/2016 DATE OF DISCHARGE:  LH                             HISTORY & PHYSICAL   HISTORY:  Regina Bush is an 83 year old, who has a very complicated medical history with episode of perforated diverticulitis, multiple complications related to that.  She has recently moved from a facility in [place] to our facility.  She has had trouble swallowing, the episode of diverticulitis, has had a fistula, previous stroke, diabetes, hypertension, osteoporosis, carotid artery stenosis, hypothyroidism, neuropathy, arthritis of multiple joints, anemia, and she has had multiple urinary tract infections.  She has had a feeding tube in place.  PAST MEDICAL HISTORY:  Positive as mentioned.  She has had ileostomy, multiple abscesses, left inguinal hernia repair, ORIF of the left wrist.  SOCIAL HISTORY:  She is living at the skilled care facility.  No episodes of alcohol or tobacco abuse.  FAMILY HISTORY:  Unknown.  REVIEW OF SYSTEMS:  Is not able to be obtained because she is poorly responsive today.  PHYSICAL EXAMINATION:  GENERAL:  She is sitting in a wheelchair.  She is poorly responsive. HEENT:  Her pupils react.  EOMI.  Mucous membranes are dry. CONSTITUTIONAL:  She is thin.  As mentioned, she is poorly responsive. CARDIOVASCULAR:  Her heart is regular with normal heart sounds. RESPIRATORY:  She has drainage from her nose, but no other focal abnormalities in her respiratory system. GASTROINTESTINAL:  She has a feeding tube.  Her abdomen is soft without masses. MUSCULOSKELETAL:  Not really able to assess. NEUROLOGICAL:  Not really able to assess. Psychiatric:  Not really able to assess.  ASSESSMENT:  She is in the skilled care facility with  multiple problems. She has had severe abdominal difficulties.  She has had urinary tract infections.  She may have influenza.  She has had a previous stroke.  PLAN:  Continue with rehab, but go ahead and check for flu.  Check for urinary tract infection and continue all the other treatments.     Regina Bush L. Luan Pulling, M.D.   ______________________________ Jasper Loser. Luan Pulling, M.D.    ELH/MEDQ  D:  05/03/2016  T:  05/04/2016  Job:  [id number]

## 2016-05-04 NOTE — ED Notes (Signed)
Report called to [facility]. Spoke w/ Hilda Blades for report. To send staff to pick up.

## 2016-05-11 ENCOUNTER — Encounter (HOSPITAL_COMMUNITY)
Admission: AD | Admit: 2016-05-11 | Discharge: 2016-05-11 | Disposition: A | Discharge status: Home / Self Care | Payer: Medicare Other | Source: Skilled Nursing Facility | Attending: Pulmonary Disease | Admitting: Pulmonary Disease

## 2016-05-11 DIAGNOSIS — N390 Urinary tract infection, site not specified: Secondary | ICD-10-CM | POA: Insufficient documentation

## 2016-05-11 LAB — URINALYSIS, ROUTINE W REFLEX MICROSCOPIC
Bilirubin Urine: NEGATIVE
GLUCOSE, UA: NEGATIVE mg/dL
HGB URINE DIPSTICK: NEGATIVE
Ketones, ur: NEGATIVE mg/dL
NITRITE: NEGATIVE
PROTEIN: 30 mg/dL — AB
Specific Gravity, Urine: 1.01 (ref 1.005–1.030)
pH: 7 (ref 5.0–8.0)

## 2016-05-12 ENCOUNTER — Encounter (HOSPITAL_COMMUNITY)
Admission: RE | Admit: 2016-05-12 | Discharge: 2016-05-12 | Disposition: A | Discharge status: Home / Self Care | Payer: Medicare Other | Source: Skilled Nursing Facility | Attending: Pulmonary Disease | Admitting: Pulmonary Disease

## 2016-05-12 DIAGNOSIS — E039 Hypothyroidism, unspecified: Secondary | ICD-10-CM | POA: Insufficient documentation

## 2016-05-12 DIAGNOSIS — N390 Urinary tract infection, site not specified: Secondary | ICD-10-CM | POA: Insufficient documentation

## 2016-05-12 DIAGNOSIS — D649 Anemia, unspecified: Secondary | ICD-10-CM | POA: Insufficient documentation

## 2016-05-12 DIAGNOSIS — E1140 Type 2 diabetes mellitus with diabetic neuropathy, unspecified: Secondary | ICD-10-CM | POA: Insufficient documentation

## 2016-05-12 DIAGNOSIS — A492 Hemophilus influenzae infection, unspecified site: Secondary | ICD-10-CM | POA: Insufficient documentation

## 2016-05-12 DIAGNOSIS — I10 Essential (primary) hypertension: Secondary | ICD-10-CM | POA: Insufficient documentation

## 2016-05-12 LAB — URINE CULTURE: CULTURE: NO GROWTH

## 2016-05-12 LAB — MAGNESIUM: MAGNESIUM: 2.2 mg/dL (ref 1.7–2.4)

## 2016-05-28 ENCOUNTER — Encounter (HOSPITAL_COMMUNITY)
Admission: AD | Admit: 2016-05-28 | Discharge: 2016-05-28 | Disposition: A | Discharge status: Home / Self Care | Payer: Medicare Other | Source: Skilled Nursing Facility | Attending: Pulmonary Disease | Admitting: Pulmonary Disease

## 2016-05-28 DIAGNOSIS — R309 Painful micturition, unspecified: Secondary | ICD-10-CM | POA: Insufficient documentation

## 2016-05-28 DIAGNOSIS — R509 Fever, unspecified: Secondary | ICD-10-CM | POA: Insufficient documentation

## 2016-05-28 LAB — URINALYSIS, MICROSCOPIC (REFLEX)

## 2016-05-28 LAB — URINALYSIS, ROUTINE W REFLEX MICROSCOPIC
BILIRUBIN URINE: NEGATIVE
Glucose, UA: NEGATIVE mg/dL
HGB URINE DIPSTICK: NEGATIVE
Ketones, ur: NEGATIVE mg/dL
NITRITE: NEGATIVE
PH: 6 (ref 5.0–8.0)
SPECIFIC GRAVITY, URINE: 1.02 (ref 1.005–1.030)

## 2016-05-30 ENCOUNTER — Encounter (HOSPITAL_COMMUNITY)
Admission: AD | Admit: 2016-05-30 | Discharge: 2016-05-30 | Disposition: A | Discharge status: Home / Self Care | Payer: Medicare Other | Source: Skilled Nursing Facility | Attending: Pulmonary Disease | Admitting: Pulmonary Disease

## 2016-05-30 LAB — URINALYSIS, ROUTINE W REFLEX MICROSCOPIC
BILIRUBIN URINE: NEGATIVE
GLUCOSE, UA: NEGATIVE mg/dL
HGB URINE DIPSTICK: NEGATIVE
Ketones, ur: NEGATIVE mg/dL
NITRITE: NEGATIVE
PROTEIN: 30 mg/dL — AB
Specific Gravity, Urine: 1.017 (ref 1.005–1.030)
pH: 5 (ref 5.0–8.0)

## 2016-05-30 LAB — URINE CULTURE

## 2016-06-01 LAB — URINE CULTURE

## 2016-06-03 ENCOUNTER — Other Ambulatory Visit (HOSPITAL_COMMUNITY)
Admission: RE | Admit: 2016-06-03 | Discharge: 2016-06-03 | Disposition: A | Discharge status: Home / Self Care | Payer: Medicare Other | Source: Skilled Nursing Facility | Attending: Pulmonary Disease | Admitting: Pulmonary Disease

## 2016-06-03 DIAGNOSIS — N390 Urinary tract infection, site not specified: Secondary | ICD-10-CM | POA: Insufficient documentation

## 2016-06-05 DIAGNOSIS — K5700 Diverticulitis of small intestine with perforation and abscess without bleeding: Secondary | ICD-10-CM | POA: Diagnosis not present

## 2016-06-05 DIAGNOSIS — R1310 Dysphagia, unspecified: Secondary | ICD-10-CM | POA: Diagnosis not present

## 2016-06-05 DIAGNOSIS — F0390 Unspecified dementia without behavioral disturbance: Secondary | ICD-10-CM | POA: Diagnosis not present

## 2016-06-05 DIAGNOSIS — N390 Urinary tract infection, site not specified: Secondary | ICD-10-CM | POA: Diagnosis not present

## 2016-06-05 LAB — URINE CULTURE

## 2016-06-07 ENCOUNTER — Other Ambulatory Visit (HOSPITAL_COMMUNITY)
Admission: AD | Admit: 2016-06-07 | Discharge: 2016-06-07 | Disposition: A | Discharge status: Home / Self Care | Payer: Medicare Other | Source: Skilled Nursing Facility | Attending: Pulmonary Disease | Admitting: Pulmonary Disease

## 2016-06-07 DIAGNOSIS — N390 Urinary tract infection, site not specified: Secondary | ICD-10-CM | POA: Insufficient documentation

## 2016-06-07 LAB — URINALYSIS, ROUTINE W REFLEX MICROSCOPIC
Bilirubin Urine: NEGATIVE
GLUCOSE, UA: 50 mg/dL — AB
Hgb urine dipstick: NEGATIVE
Ketones, ur: NEGATIVE mg/dL
Nitrite: NEGATIVE
PH: 5 (ref 5.0–8.0)
Protein, ur: 30 mg/dL — AB
SPECIFIC GRAVITY, URINE: 1.018 (ref 1.005–1.030)

## 2016-06-07 NOTE — Progress Notes (Signed)
NAME:  WALLY, BEHAN NO.:  MEDICAL RECORD NO.:  [id number]  LOCATION:                                 FACILITY:  PHYSICIAN:  Lleyton Byers L. Luan Pulling, M.D.DATE OF BIRTH:  30-Aug-1927  DATE OF PROCEDURE:  06/05/2016 DATE OF DISCHARGE:                                PROGRESS NOTE   SUBJECTIVE:  Regina Bush is an 83 year old who has multiple medical problems including dementia, recurrent urinary tract infections, perforated diverticulitis, complications from that.  She has had a fistula.  She has had a previous stroke, diabetes, hypertension, carotid artery stenosis, hypothyroidism, neuropathy, arthritis of multiple joints, and she has had a feeding tube in place.  She has had another episode of being confused and that appears to be related to another urinary tract infection.  When she is not having the urinary tract infection, she has been awake and alert, although confused.  She has begun being able to manage eating rather than having to have tube feedings and has generally improved somewhat.  This morning, she is awake and alert, confused, and moving around in the facility in her wheelchair.  She has no complaints.  I reviewed her medications in the [facility].  She has just had another urinary tract infection and has been treated for that and has improved.  OBJECTIVE:  CONSTITUTIONAL:  She is awake, alert, and confused, in a wheelchair. EYES:  Pupils react. EARS, NOSE, MOUTH, and THROAT:  Mucous membranes are moist. RESPIRATORY:  Her respiratory effort is normal.  Her lungs are clear with somewhat diminished breath sounds. CARDIOVASCULAR:  Her heart is regular with normal heart sounds. GASTROINTESTINAL:  PEG tube site looks okay. ABDOMEN:  Soft with normal bowel sounds. MUSCULOSKELETAL:  She is able to move around in her wheelchair well. NEUROLOGIC:  She is very confused, but able to have a conversation, which she has not had when I had seen her in the  past. SKIN:  Warm and dry.  ASSESSMENT:  She has another urinary tract infection and she seems to be getting over that.  She has multiple other complications.  She has had perforated diverticulitis.  She has dementia.  She has neuropathy.  She has had tube feeding.  She has had trouble with dysphagia, which has improved.  She has had multiple abscesses in the abdomen and overall she is getting better.  Her confusion, I think, is probably at baseline now.    Daneesha Quinteros L. Luan Pulling, M.D.    ELH/MEDQ  D:  06/06/2016  T:  06/06/2016  Job:  [id number]

## 2016-06-09 LAB — URINE CULTURE

## 2016-06-19 ENCOUNTER — Emergency Department (HOSPITAL_COMMUNITY): Payer: Medicare Other

## 2016-06-19 ENCOUNTER — Encounter (HOSPITAL_COMMUNITY): Payer: Self-pay | Admitting: *Deleted

## 2016-06-19 ENCOUNTER — Emergency Department (HOSPITAL_COMMUNITY)
Admission: EM | Admit: 2016-06-19 | Discharge: 2016-06-19 | Disposition: A | Discharge status: Home / Self Care | Payer: Medicare Other | Attending: Emergency Medicine | Admitting: Emergency Medicine

## 2016-06-19 DIAGNOSIS — I10 Essential (primary) hypertension: Secondary | ICD-10-CM | POA: Diagnosis not present

## 2016-06-19 DIAGNOSIS — I2510 Atherosclerotic heart disease of native coronary artery without angina pectoris: Secondary | ICD-10-CM | POA: Diagnosis not present

## 2016-06-19 DIAGNOSIS — Z7982 Long term (current) use of aspirin: Secondary | ICD-10-CM | POA: Insufficient documentation

## 2016-06-19 DIAGNOSIS — Z79899 Other long term (current) drug therapy: Secondary | ICD-10-CM | POA: Diagnosis not present

## 2016-06-19 DIAGNOSIS — Y999 Unspecified external cause status: Secondary | ICD-10-CM | POA: Diagnosis not present

## 2016-06-19 DIAGNOSIS — F0390 Unspecified dementia without behavioral disturbance: Secondary | ICD-10-CM | POA: Diagnosis not present

## 2016-06-19 DIAGNOSIS — S4991XA Unspecified injury of right shoulder and upper arm, initial encounter: Secondary | ICD-10-CM | POA: Diagnosis not present

## 2016-06-19 DIAGNOSIS — M25511 Pain in right shoulder: Secondary | ICD-10-CM

## 2016-06-19 DIAGNOSIS — W19XXXA Unspecified fall, initial encounter: Secondary | ICD-10-CM

## 2016-06-19 DIAGNOSIS — E119 Type 2 diabetes mellitus without complications: Secondary | ICD-10-CM | POA: Insufficient documentation

## 2016-06-19 DIAGNOSIS — Y939 Activity, unspecified: Secondary | ICD-10-CM | POA: Insufficient documentation

## 2016-06-19 DIAGNOSIS — Y929 Unspecified place or not applicable: Secondary | ICD-10-CM | POA: Insufficient documentation

## 2016-06-19 DIAGNOSIS — W1809XA Striking against other object with subsequent fall, initial encounter: Secondary | ICD-10-CM | POA: Insufficient documentation

## 2016-06-19 NOTE — ED Triage Notes (Signed)
Pt fell during the night, c/o right shoulder pain, cms intact distal

## 2016-06-19 NOTE — Discharge Instructions (Signed)
Xray shows no fracture.  Ice, Tylenol.

## 2016-06-19 NOTE — ED Provider Notes (Signed)
[facility] DEPT Provider Note   CSN: [id number] Arrival date & time: 06/19/16  1915     History   Chief Complaint Chief Complaint  Patient presents with  . Fall    HPI Regina Bush is a 83 y.o. female.  Level V caveat for mild dementia. Patient apparently fell last night striking her right shoulder. No other obvious injuries. No head or neck trauma. She is alert with normal behavior according to her family.      Past Medical History:  Diagnosis Date  . Anemia   . Arthritis   . Cerebral infarction ([place])   . Coronary artery disease   . Dementia   . Diabetes mellitus without complication (HCC)    Type 2  . Dysphagia due to recent cerebral infarction   . Failure to thrive (0-17)   . Hypercalcemia   . Hypertension   . Pneumonitis   . Protein calorie malnutrition ([place])   . Subdural hematoma ([place])   . Thyroid disease     There are no active problems to display for this patient.   History reviewed. No pertinent surgical history.  OB History    No data available       Home Medications    Prior to Admission medications   Medication Sig Start Date End Date Taking? Authorizing Provider  amLODipine (NORVASC) 10 MG tablet Place 10 mg into feeding tube daily.    Historical Provider, MD  aspirin EC 81 MG tablet Take 81 mg by mouth daily.    Historical Provider, MD  atorvastatin (LIPITOR) 20 MG tablet Place 20 mg into feeding tube daily.    Historical Provider, MD  cholecalciferol (VITAMIN D) 1000 units tablet Place 1,000 Units into feeding tube daily.    Historical Provider, MD  ciprofloxacin (CIPRO) 250 MG tablet Place 250 mg into feeding tube 2 (two) times daily.    Historical Provider, MD  famotidine (PEPCID) 20 MG tablet Place 20 mg into feeding tube daily.    Historical Provider, MD  hydrALAZINE (APRESOLINE) 25 MG tablet Place 25 mg into feeding tube 3 (three) times daily.    Historical Provider, MD  levothyroxine (SYNTHROID, LEVOTHROID) 50 MCG tablet Place  50 mcg into feeding tube daily before breakfast.    Historical Provider, MD  magnesium oxide (MAG-OX) 400 MG tablet Place 400 mg into feeding tube daily.    Historical Provider, MD  Multiple Vitamin (MULTIVITAMIN WITH MINERALS) TABS tablet Place 1 tablet into feeding tube daily.    Historical Provider, MD  Wheat Dextrin (BENEFIBER PO) 10 mLs by Gastric Tube route daily.    Historical Provider, MD    Family History No family history on file.  Social History Social History  Substance Use Topics  . Smoking status: Unknown If Ever Smoked  . Smokeless tobacco: Never Used  . Alcohol use Not on file     Allergies   Cymbalta [duloxetine hcl]; Effexor [venlafaxine]; Morphine and related; and Tape   Review of Systems Review of Systems  Reason unable to perform ROS: Dementia.     Physical Exam Updated Vital Signs BP (!) 158/63   Pulse (!) 59   Temp 97.7 F (36.5 C) (Oral)   Resp 17   Wt 130 lb (59 kg)   SpO2 95%   BMI 20.98 kg/m   Physical Exam  Constitutional: She is oriented to person, place, and time.  NAD  HENT:  Head: Normocephalic and atraumatic.  Eyes: Conjunctivae are normal.  Neck: Neck supple.  Cardiovascular:  Normal rate and regular rhythm.   Pulmonary/Chest: Effort normal and breath sounds normal.  Abdominal: Soft. Bowel sounds are normal.  Musculoskeletal:  Tenderness surrounding the right shoulder joint. Pain with range of motion.  Neurological: She is alert and oriented to person, place, and time.  Skin: Skin is warm and dry.  Psychiatric:  Mild dementia  Nursing note and vitals reviewed.    ED Treatments / Results  Labs (all labs ordered are listed, but only abnormal results are displayed) Labs Reviewed - No data to display  EKG  EKG Interpretation None       Radiology Dg Shoulder Right  Result Date: 06/19/2016 CLINICAL DATA:  Status post fall, with right shoulder pain. Initial encounter. EXAM: RIGHT SHOULDER - 2+ VIEW COMPARISON:  None.  FINDINGS: There is no evidence of fracture or dislocation. The right humeral head is seated within the glenoid fossa. The acromioclavicular joint is unremarkable in appearance. No significant soft tissue abnormalities are seen. The visualized portions of the right lung are clear. IMPRESSION: No evidence of fracture or dislocation. Electronically Signed   By: Garald Balding M.D.   On: 06/19/2016 20:01   Dg Humerus Right  Result Date: 06/19/2016 CLINICAL DATA:  Status post fall, with right shoulder pain. Initial encounter. EXAM: RIGHT HUMERUS - 2+ VIEW COMPARISON:  None. FINDINGS: There is no evidence of fracture or dislocation. The right humerus is grossly unremarkable in appearance. The elbow joint is incompletely assessed, but appears grossly unremarkable. The right humeral head remains seated at the glenoid fossa. No definite soft tissue abnormalities are characterized on radiograph. IMPRESSION: No evidence of fracture or dislocation. Electronically Signed   By: Garald Balding M.D.   On: 06/19/2016 20:02    Procedures Procedures (including critical care time)  Medications Ordered in ED Medications - No data to display   Initial Impression / Assessment and Plan / ED Course  I have reviewed the triage vital signs and the nursing notes.  Pertinent labs & imaging results that were available during my care of the patient were reviewed by me and considered in my medical decision making (see chart for details).     Plain films of right shoulder and right humerus show no obvious fracture. These findings were discussed with the family which included Dr. Lemmie Evens.  Will apply a sling. If symptoms persist, recommend MRI.  Final Clinical Impressions(s) / ED Diagnoses   Final diagnoses:  Fall, initial encounter  Acute pain of right shoulder    New Prescriptions Discharge Medication List as of 06/19/2016  8:10 PM       Nat Christen, MD 06/19/16 2124

## 2016-06-19 NOTE — ED Notes (Signed)
Patient transported to X-ray 

## 2016-06-22 DIAGNOSIS — Z9181 History of falling: Secondary | ICD-10-CM | POA: Diagnosis not present

## 2016-06-22 DIAGNOSIS — Z931 Gastrostomy status: Secondary | ICD-10-CM | POA: Diagnosis not present

## 2016-06-22 DIAGNOSIS — E46 Unspecified protein-calorie malnutrition: Secondary | ICD-10-CM | POA: Diagnosis not present

## 2016-06-22 DIAGNOSIS — E1140 Type 2 diabetes mellitus with diabetic neuropathy, unspecified: Secondary | ICD-10-CM | POA: Diagnosis not present

## 2016-06-22 DIAGNOSIS — M159 Polyosteoarthritis, unspecified: Secondary | ICD-10-CM | POA: Diagnosis not present

## 2016-06-22 DIAGNOSIS — I2510 Atherosclerotic heart disease of native coronary artery without angina pectoris: Secondary | ICD-10-CM | POA: Diagnosis not present

## 2016-06-22 DIAGNOSIS — R1310 Dysphagia, unspecified: Secondary | ICD-10-CM | POA: Diagnosis not present

## 2016-06-22 DIAGNOSIS — Z87440 Personal history of urinary (tract) infections: Secondary | ICD-10-CM | POA: Diagnosis not present

## 2016-06-22 DIAGNOSIS — I69391 Dysphagia following cerebral infarction: Secondary | ICD-10-CM | POA: Diagnosis not present

## 2016-06-22 DIAGNOSIS — F0390 Unspecified dementia without behavioral disturbance: Secondary | ICD-10-CM | POA: Diagnosis not present

## 2016-06-22 DIAGNOSIS — I10 Essential (primary) hypertension: Secondary | ICD-10-CM | POA: Diagnosis not present

## 2016-06-22 DIAGNOSIS — M6281 Muscle weakness (generalized): Secondary | ICD-10-CM | POA: Diagnosis not present

## 2016-06-22 DIAGNOSIS — M25511 Pain in right shoulder: Secondary | ICD-10-CM | POA: Diagnosis not present

## 2016-06-24 DIAGNOSIS — I69391 Dysphagia following cerebral infarction: Secondary | ICD-10-CM | POA: Diagnosis not present

## 2016-06-24 DIAGNOSIS — E1140 Type 2 diabetes mellitus with diabetic neuropathy, unspecified: Secondary | ICD-10-CM | POA: Diagnosis not present

## 2016-06-24 DIAGNOSIS — M25511 Pain in right shoulder: Secondary | ICD-10-CM | POA: Diagnosis not present

## 2016-06-24 DIAGNOSIS — M6281 Muscle weakness (generalized): Secondary | ICD-10-CM | POA: Diagnosis not present

## 2016-06-24 DIAGNOSIS — F0390 Unspecified dementia without behavioral disturbance: Secondary | ICD-10-CM | POA: Diagnosis not present

## 2016-06-24 DIAGNOSIS — M159 Polyosteoarthritis, unspecified: Secondary | ICD-10-CM | POA: Diagnosis not present

## 2016-06-28 DIAGNOSIS — F0390 Unspecified dementia without behavioral disturbance: Secondary | ICD-10-CM | POA: Diagnosis not present

## 2016-06-28 DIAGNOSIS — M159 Polyosteoarthritis, unspecified: Secondary | ICD-10-CM | POA: Diagnosis not present

## 2016-06-28 DIAGNOSIS — E1140 Type 2 diabetes mellitus with diabetic neuropathy, unspecified: Secondary | ICD-10-CM | POA: Diagnosis not present

## 2016-06-28 DIAGNOSIS — I69391 Dysphagia following cerebral infarction: Secondary | ICD-10-CM | POA: Diagnosis not present

## 2016-06-28 DIAGNOSIS — M6281 Muscle weakness (generalized): Secondary | ICD-10-CM | POA: Diagnosis not present

## 2016-06-28 DIAGNOSIS — M25511 Pain in right shoulder: Secondary | ICD-10-CM | POA: Diagnosis not present

## 2016-06-30 DIAGNOSIS — M25511 Pain in right shoulder: Secondary | ICD-10-CM | POA: Diagnosis not present

## 2016-06-30 DIAGNOSIS — M159 Polyosteoarthritis, unspecified: Secondary | ICD-10-CM | POA: Diagnosis not present

## 2016-06-30 DIAGNOSIS — M6281 Muscle weakness (generalized): Secondary | ICD-10-CM | POA: Diagnosis not present

## 2016-06-30 DIAGNOSIS — F0390 Unspecified dementia without behavioral disturbance: Secondary | ICD-10-CM | POA: Diagnosis not present

## 2016-06-30 DIAGNOSIS — E1140 Type 2 diabetes mellitus with diabetic neuropathy, unspecified: Secondary | ICD-10-CM | POA: Diagnosis not present

## 2016-06-30 DIAGNOSIS — I69391 Dysphagia following cerebral infarction: Secondary | ICD-10-CM | POA: Diagnosis not present

## 2016-07-06 DIAGNOSIS — M25511 Pain in right shoulder: Secondary | ICD-10-CM | POA: Diagnosis not present

## 2016-07-06 DIAGNOSIS — M6281 Muscle weakness (generalized): Secondary | ICD-10-CM | POA: Diagnosis not present

## 2016-07-06 DIAGNOSIS — I69391 Dysphagia following cerebral infarction: Secondary | ICD-10-CM | POA: Diagnosis not present

## 2016-07-06 DIAGNOSIS — M159 Polyosteoarthritis, unspecified: Secondary | ICD-10-CM | POA: Diagnosis not present

## 2016-07-06 DIAGNOSIS — F0390 Unspecified dementia without behavioral disturbance: Secondary | ICD-10-CM | POA: Diagnosis not present

## 2016-07-06 DIAGNOSIS — E1140 Type 2 diabetes mellitus with diabetic neuropathy, unspecified: Secondary | ICD-10-CM | POA: Diagnosis not present

## 2016-07-09 DIAGNOSIS — M25511 Pain in right shoulder: Secondary | ICD-10-CM | POA: Diagnosis not present

## 2016-07-09 DIAGNOSIS — M6281 Muscle weakness (generalized): Secondary | ICD-10-CM | POA: Diagnosis not present

## 2016-07-09 DIAGNOSIS — I69391 Dysphagia following cerebral infarction: Secondary | ICD-10-CM | POA: Diagnosis not present

## 2016-07-09 DIAGNOSIS — E1140 Type 2 diabetes mellitus with diabetic neuropathy, unspecified: Secondary | ICD-10-CM | POA: Diagnosis not present

## 2016-07-09 DIAGNOSIS — F0390 Unspecified dementia without behavioral disturbance: Secondary | ICD-10-CM | POA: Diagnosis not present

## 2016-07-09 DIAGNOSIS — M159 Polyosteoarthritis, unspecified: Secondary | ICD-10-CM | POA: Diagnosis not present

## 2016-07-12 DIAGNOSIS — M25511 Pain in right shoulder: Secondary | ICD-10-CM | POA: Diagnosis not present

## 2016-07-12 DIAGNOSIS — F0390 Unspecified dementia without behavioral disturbance: Secondary | ICD-10-CM | POA: Diagnosis not present

## 2016-07-12 DIAGNOSIS — M6281 Muscle weakness (generalized): Secondary | ICD-10-CM | POA: Diagnosis not present

## 2016-07-12 DIAGNOSIS — E1140 Type 2 diabetes mellitus with diabetic neuropathy, unspecified: Secondary | ICD-10-CM | POA: Diagnosis not present

## 2016-07-12 DIAGNOSIS — M159 Polyosteoarthritis, unspecified: Secondary | ICD-10-CM | POA: Diagnosis not present

## 2016-07-12 DIAGNOSIS — I69391 Dysphagia following cerebral infarction: Secondary | ICD-10-CM | POA: Diagnosis not present

## 2016-07-13 ENCOUNTER — Other Ambulatory Visit (HOSPITAL_COMMUNITY)
Admission: RE | Admit: 2016-07-13 | Discharge: 2016-07-13 | Disposition: A | Discharge status: Home / Self Care | Payer: Medicare Other | Source: Other Acute Inpatient Hospital | Attending: Pulmonary Disease | Admitting: Pulmonary Disease

## 2016-07-13 DIAGNOSIS — N390 Urinary tract infection, site not specified: Secondary | ICD-10-CM | POA: Diagnosis not present

## 2016-07-13 LAB — URINALYSIS, COMPLETE (UACMP) WITH MICROSCOPIC
BILIRUBIN URINE: NEGATIVE
HGB URINE DIPSTICK: NEGATIVE
Ketones, ur: NEGATIVE mg/dL
Nitrite: NEGATIVE
PROTEIN: 30 mg/dL — AB
Specific Gravity, Urine: 1.017 (ref 1.005–1.030)
pH: 5 (ref 5.0–8.0)

## 2016-07-15 DIAGNOSIS — E1140 Type 2 diabetes mellitus with diabetic neuropathy, unspecified: Secondary | ICD-10-CM | POA: Diagnosis not present

## 2016-07-15 DIAGNOSIS — M6281 Muscle weakness (generalized): Secondary | ICD-10-CM | POA: Diagnosis not present

## 2016-07-15 DIAGNOSIS — M159 Polyosteoarthritis, unspecified: Secondary | ICD-10-CM | POA: Diagnosis not present

## 2016-07-15 DIAGNOSIS — F0390 Unspecified dementia without behavioral disturbance: Secondary | ICD-10-CM | POA: Diagnosis not present

## 2016-07-15 DIAGNOSIS — M25511 Pain in right shoulder: Secondary | ICD-10-CM | POA: Diagnosis not present

## 2016-07-15 DIAGNOSIS — I69391 Dysphagia following cerebral infarction: Secondary | ICD-10-CM | POA: Diagnosis not present

## 2016-07-16 LAB — URINE CULTURE

## 2016-07-19 DIAGNOSIS — F0390 Unspecified dementia without behavioral disturbance: Secondary | ICD-10-CM | POA: Diagnosis not present

## 2016-07-19 DIAGNOSIS — M25511 Pain in right shoulder: Secondary | ICD-10-CM | POA: Diagnosis not present

## 2016-07-19 DIAGNOSIS — E1140 Type 2 diabetes mellitus with diabetic neuropathy, unspecified: Secondary | ICD-10-CM | POA: Diagnosis not present

## 2016-07-19 DIAGNOSIS — M6281 Muscle weakness (generalized): Secondary | ICD-10-CM | POA: Diagnosis not present

## 2016-07-19 DIAGNOSIS — M159 Polyosteoarthritis, unspecified: Secondary | ICD-10-CM | POA: Diagnosis not present

## 2016-07-19 DIAGNOSIS — I69391 Dysphagia following cerebral infarction: Secondary | ICD-10-CM | POA: Diagnosis not present

## 2016-08-04 DIAGNOSIS — N390 Urinary tract infection, site not specified: Secondary | ICD-10-CM | POA: Diagnosis not present

## 2016-08-09 ENCOUNTER — Emergency Department (HOSPITAL_COMMUNITY)
Admission: EM | Admit: 2016-08-09 | Discharge: 2016-08-09 | Disposition: A | Discharge status: Home / Self Care | Payer: Medicare Other | Attending: Emergency Medicine | Admitting: Emergency Medicine

## 2016-08-09 ENCOUNTER — Emergency Department (HOSPITAL_COMMUNITY): Payer: Medicare Other

## 2016-08-09 ENCOUNTER — Encounter (HOSPITAL_COMMUNITY): Payer: Self-pay | Admitting: Emergency Medicine

## 2016-08-09 DIAGNOSIS — I10 Essential (primary) hypertension: Secondary | ICD-10-CM | POA: Diagnosis not present

## 2016-08-09 DIAGNOSIS — Z7982 Long term (current) use of aspirin: Secondary | ICD-10-CM | POA: Diagnosis not present

## 2016-08-09 DIAGNOSIS — M7989 Other specified soft tissue disorders: Secondary | ICD-10-CM | POA: Diagnosis not present

## 2016-08-09 DIAGNOSIS — I2510 Atherosclerotic heart disease of native coronary artery without angina pectoris: Secondary | ICD-10-CM | POA: Insufficient documentation

## 2016-08-09 DIAGNOSIS — Z79899 Other long term (current) drug therapy: Secondary | ICD-10-CM | POA: Insufficient documentation

## 2016-08-09 DIAGNOSIS — R0682 Tachypnea, not elsewhere classified: Secondary | ICD-10-CM | POA: Diagnosis not present

## 2016-08-09 DIAGNOSIS — E119 Type 2 diabetes mellitus without complications: Secondary | ICD-10-CM | POA: Insufficient documentation

## 2016-08-09 DIAGNOSIS — M79604 Pain in right leg: Secondary | ICD-10-CM | POA: Diagnosis not present

## 2016-08-09 DIAGNOSIS — R609 Edema, unspecified: Secondary | ICD-10-CM | POA: Diagnosis not present

## 2016-08-09 LAB — CBC WITH DIFFERENTIAL/PLATELET
BASOS PCT: 0 %
Basophils Absolute: 0 10*3/uL (ref 0.0–0.1)
EOS ABS: 0.2 10*3/uL (ref 0.0–0.7)
Eosinophils Relative: 3 %
HCT: 30.9 % — ABNORMAL LOW (ref 36.0–46.0)
HEMOGLOBIN: 10 g/dL — AB (ref 12.0–15.0)
Lymphocytes Relative: 25 %
Lymphs Abs: 1.9 10*3/uL (ref 0.7–4.0)
MCH: 30.2 pg (ref 26.0–34.0)
MCHC: 32.4 g/dL (ref 30.0–36.0)
MCV: 93.4 fL (ref 78.0–100.0)
Monocytes Absolute: 0.8 10*3/uL (ref 0.1–1.0)
Monocytes Relative: 11 %
NEUTROS PCT: 61 %
Neutro Abs: 4.8 10*3/uL (ref 1.7–7.7)
PLATELETS: 241 10*3/uL (ref 150–400)
RBC: 3.31 MIL/uL — AB (ref 3.87–5.11)
RDW: 14.2 % (ref 11.5–15.5)
WBC: 7.8 10*3/uL (ref 4.0–10.5)

## 2016-08-09 LAB — BASIC METABOLIC PANEL
Anion gap: 8 (ref 5–15)
BUN: 37 mg/dL — ABNORMAL HIGH (ref 6–20)
CALCIUM: 9.9 mg/dL (ref 8.9–10.3)
CO2: 24 mmol/L (ref 22–32)
CREATININE: 1.58 mg/dL — AB (ref 0.44–1.00)
Chloride: 103 mmol/L (ref 101–111)
GFR, EST AFRICAN AMERICAN: 33 mL/min — AB (ref >60)
GFR, EST NON AFRICAN AMERICAN: 28 mL/min — AB (ref >60)
Glucose, Bld: 431 mg/dL — ABNORMAL HIGH (ref 65–99)
Potassium: 4.1 mmol/L (ref 3.5–5.1)
SODIUM: 135 mmol/L (ref 135–145)

## 2016-08-09 MED ORDER — APIXABAN 5 MG PO TABS
10.0000 mg | ORAL_TABLET | Freq: Once | ORAL | Status: AC
  Administered 2016-08-09: 10 mg via ORAL
  Filled 2016-08-09: qty 2

## 2016-08-09 MED ORDER — APIXABAN 5 MG PO TABS
ORAL_TABLET | ORAL | Status: AC
  Filled 2016-08-09: qty 2

## 2016-08-09 NOTE — ED Provider Notes (Signed)
[facility] DEPT Provider Note   CSN: [id number] Arrival date & time: 08/09/16  1540     History   Chief Complaint Chief Complaint  Patient presents with  . Leg Swelling    HPI Regina Bush is a 83 y.o. female.  Pt presents to the ED today with right leg pain and swelling.  She said that she noticed it 2 days ago.  She does not have any pain.  She denies sob or cp.      Past Medical History:  Diagnosis Date  . Anemia   . Arthritis   . Cerebral infarction ([place])   . Coronary artery disease   . Dementia   . Diabetes mellitus without complication (HCC)    Type 2  . Dysphagia due to recent cerebral infarction   . Failure to thrive (0-17)   . Hypercalcemia   . Hypertension   . Pneumonitis   . Protein calorie malnutrition ([place])   . Subdural hematoma ([place])   . Thyroid disease     There are no active problems to display for this patient.   History reviewed. No pertinent surgical history.  OB History    No data available       Home Medications    Prior to Admission medications   Medication Sig Start Date End Date Taking? Authorizing Provider  acetaminophen (TYLENOL) 325 MG tablet Take 650 mg by mouth every 4 (four) hours as needed for mild pain or moderate pain.   Yes [provider]  amLODipine (NORVASC) 10 MG tablet Place 10 mg into feeding tube daily.   Yes [provider]  aspirin EC 81 MG tablet Take 81 mg by mouth daily.   Yes [provider]  atorvastatin (LIPITOR) 20 MG tablet Place 20 mg into feeding tube daily.   Yes [provider]  cholecalciferol (VITAMIN D) 1000 units tablet Place 1,000 Units into feeding tube daily.   Yes [provider]  famotidine (PEPCID) 20 MG tablet Place 20 mg into feeding tube daily.   Yes [provider]  fluticasone (FLONASE) 50 MCG/ACT nasal spray Place 2 sprays into both nostrils daily.   Yes [provider]  hydrALAZINE (APRESOLINE) 25 MG tablet Place  25 mg into feeding tube 3 (three) times daily.   Yes [provider]  levothyroxine (SYNTHROID, LEVOTHROID) 50 MCG tablet Place 50 mcg into feeding tube daily before breakfast.   Yes [provider]  magnesium oxide (MAG-OX) 400 MG tablet Place 400 mg into feeding tube daily.   Yes [provider]  Multiple Vitamin (MULTIVITAMIN WITH MINERALS) TABS tablet Place 1 tablet into feeding tube daily.   Yes [provider]  nystatin (MYCOSTATIN/NYSTOP) powder Apply topically 2 (two) times daily as needed (for redness).   Yes [provider]  Wheat Dextrin (BENEFIBER PO) 10 mLs by Gastric Tube route daily.   Yes [provider]    Family History No family history on file.  Social History Social History  Substance Use Topics  . Smoking status: Unknown If Ever Smoked  . Smokeless tobacco: Never Used  . Alcohol use Not on file     Allergies   Cymbalta [duloxetine hcl]; Effexor [venlafaxine]; Morphine and related; and Tape   Review of Systems Review of Systems  Musculoskeletal:       Right leg pain/swelling  All other systems reviewed and are negative.    Physical Exam Updated Vital Signs BP (!) 155/57 (BP Location: Left Arm)   Pulse  92   Temp 98.7 F (37.1 C) (Oral)   Resp 18   Ht 5\' 5"  (1.651 m)   Wt 63.5 kg (140 lb)   SpO2 94%   BMI 23.30 kg/m   Physical Exam  Constitutional: She is oriented to person, place, and time. She appears well-developed and well-nourished.  HENT:  Head: Normocephalic and atraumatic.  Right Ear: External ear normal.  Left Ear: External ear normal.  Nose: Nose normal.  Mouth/Throat: Oropharynx is clear and moist.  Eyes: Conjunctivae and EOM are normal. Pupils are equal, round, and reactive to light.  Neck: Normal range of motion. Neck supple.  Cardiovascular: Normal rate, regular rhythm, normal heart sounds and intact distal pulses.   Pulmonary/Chest: Effort normal and breath sounds normal.    Abdominal: Soft. Bowel sounds are normal.  Musculoskeletal:  Right leg more swollen than left (see picture below).    Neurological: She is alert and oriented to person, place, and time.  Skin: Skin is warm.  Psychiatric: She has a normal mood and affect. Her behavior is normal.  Nursing note and vitals reviewed.    ED Treatments / Results  Labs (all labs ordered are listed, but only abnormal results are displayed) Labs Reviewed  BASIC METABOLIC PANEL - Abnormal; Notable for the following:       Result Value   Glucose, Bld 431 (*)    BUN 37 (*)    Creatinine, Ser 1.58 (*)    GFR calc non Af Amer 28 (*)    GFR calc Af Amer 33 (*)    All other components within normal limits  CBC WITH DIFFERENTIAL/PLATELET - Abnormal; Notable for the following:    RBC 3.31 (*)    Hemoglobin 10.0 (*)    HCT 30.9 (*)    All other components within normal limits    EKG  EKG Interpretation None       Radiology Dg Tibia/fibula Right  Result Date: 08/09/2016 CLINICAL DATA:  Swelling and pain. EXAM: RIGHT TIBIA AND FIBULA - 2 VIEW COMPARISON:  None. FINDINGS: A screw traverses the proximal tibia. Soft tissue edema. Vascular calcifications. No joint effusion, fracture, or bony erosion. Osteopenia. IMPRESSION: No acute bony abnormality. Electronically Signed   By: Dorise Bullion III M.D   On: 08/09/2016 16:43    Procedures Procedures (including critical care time)  Medications Ordered in ED Medications  apixaban (ELIQUIS) tablet 10 mg (not administered)     Initial Impression / Assessment and Plan / ED Course  I have reviewed the triage vital signs and the nursing notes.  Pertinent labs & imaging results that were available during my care of the patient were reviewed by me and considered in my medical decision making (see chart for details).       I strongly suspect pt does have a DVT.  Unfortunately, vascular US is not available today as it's a holiday.  The pt will be treated with  1 dose of Eliquis (no dosing change in renal insufficiency) and an outpatient [place] will be ordered for tomorrow.  Final Clinical Impressions(s) / ED Diagnoses   Final diagnoses:  Right leg swelling    New Prescriptions New Prescriptions   No medications on file     Isla Pence, MD 08/09/16 1704

## 2016-08-09 NOTE — ED Notes (Signed)
Report given to crystal at [facility]

## 2016-08-09 NOTE — ED Triage Notes (Signed)
Per EMS patient is from [place]. Patient complains of right leg swelling and pain. Swelling from right knee to ankle with redness.

## 2016-08-09 NOTE — Discharge Instructions (Signed)
[place] to be done tomorrow to r/o DVT.

## 2016-08-09 NOTE — ED Notes (Signed)
Pt returned from xray. nad 

## 2016-08-09 NOTE — ED Notes (Signed)
Pt taken to xray 

## 2016-08-10 ENCOUNTER — Other Ambulatory Visit (HOSPITAL_COMMUNITY): Payer: Self-pay | Admitting: Pulmonary Disease

## 2016-08-10 ENCOUNTER — Ambulatory Visit (HOSPITAL_COMMUNITY)
Admission: RE | Admit: 2016-08-10 | Discharge: 2016-08-10 | Disposition: A | Discharge status: Home / Self Care | Payer: Medicare Other | Source: Ambulatory Visit | Attending: Pulmonary Disease | Admitting: Pulmonary Disease

## 2016-08-10 DIAGNOSIS — M79661 Pain in right lower leg: Secondary | ICD-10-CM | POA: Diagnosis not present

## 2016-08-10 DIAGNOSIS — M7989 Other specified soft tissue disorders: Secondary | ICD-10-CM | POA: Diagnosis not present

## 2016-08-19 ENCOUNTER — Emergency Department (HOSPITAL_COMMUNITY): Payer: Medicare Other

## 2016-08-19 ENCOUNTER — Inpatient Hospital Stay (HOSPITAL_COMMUNITY)
Admission: EM | Admit: 2016-08-19 | Discharge: 2016-08-25 | DRG: 195 | Disposition: A | Discharge status: Skilled Nursing Facility | Payer: Medicare Other | Attending: Family Medicine | Admitting: Family Medicine

## 2016-08-19 ENCOUNTER — Encounter (HOSPITAL_COMMUNITY): Payer: Self-pay

## 2016-08-19 DIAGNOSIS — Z87440 Personal history of urinary (tract) infections: Secondary | ICD-10-CM | POA: Diagnosis not present

## 2016-08-19 DIAGNOSIS — Z87820 Personal history of traumatic brain injury: Secondary | ICD-10-CM | POA: Diagnosis not present

## 2016-08-19 DIAGNOSIS — Z22322 Carrier or suspected carrier of Methicillin resistant Staphylococcus aureus: Secondary | ICD-10-CM

## 2016-08-19 DIAGNOSIS — Z79899 Other long term (current) drug therapy: Secondary | ICD-10-CM | POA: Diagnosis not present

## 2016-08-19 DIAGNOSIS — Z91048 Other nonmedicinal substance allergy status: Secondary | ICD-10-CM | POA: Diagnosis not present

## 2016-08-19 DIAGNOSIS — Z885 Allergy status to narcotic agent status: Secondary | ICD-10-CM

## 2016-08-19 DIAGNOSIS — Z7982 Long term (current) use of aspirin: Secondary | ICD-10-CM | POA: Diagnosis not present

## 2016-08-19 DIAGNOSIS — I69391 Dysphagia following cerebral infarction: Secondary | ICD-10-CM

## 2016-08-19 DIAGNOSIS — R531 Weakness: Secondary | ICD-10-CM | POA: Diagnosis not present

## 2016-08-19 DIAGNOSIS — J189 Pneumonia, unspecified organism: Principal | ICD-10-CM | POA: Diagnosis present

## 2016-08-19 DIAGNOSIS — Y95 Nosocomial condition: Secondary | ICD-10-CM | POA: Diagnosis present

## 2016-08-19 DIAGNOSIS — E119 Type 2 diabetes mellitus without complications: Secondary | ICD-10-CM | POA: Diagnosis not present

## 2016-08-19 DIAGNOSIS — I10 Essential (primary) hypertension: Secondary | ICD-10-CM | POA: Diagnosis present

## 2016-08-19 DIAGNOSIS — I6200 Nontraumatic subdural hemorrhage, unspecified: Secondary | ICD-10-CM | POA: Diagnosis not present

## 2016-08-19 DIAGNOSIS — Z993 Dependence on wheelchair: Secondary | ICD-10-CM

## 2016-08-19 DIAGNOSIS — R0602 Shortness of breath: Secondary | ICD-10-CM | POA: Diagnosis not present

## 2016-08-19 DIAGNOSIS — Z888 Allergy status to other drugs, medicaments and biological substances status: Secondary | ICD-10-CM | POA: Diagnosis not present

## 2016-08-19 DIAGNOSIS — I2510 Atherosclerotic heart disease of native coronary artery without angina pectoris: Secondary | ICD-10-CM | POA: Diagnosis present

## 2016-08-19 DIAGNOSIS — S065X9A Traumatic subdural hemorrhage with loss of consciousness of unspecified duration, initial encounter: Secondary | ICD-10-CM

## 2016-08-19 DIAGNOSIS — F0390 Unspecified dementia without behavioral disturbance: Secondary | ICD-10-CM | POA: Diagnosis present

## 2016-08-19 DIAGNOSIS — S30814A Abrasion of vagina and vulva, initial encounter: Secondary | ICD-10-CM | POA: Diagnosis present

## 2016-08-19 DIAGNOSIS — E1165 Type 2 diabetes mellitus with hyperglycemia: Secondary | ICD-10-CM | POA: Diagnosis not present

## 2016-08-19 DIAGNOSIS — R404 Transient alteration of awareness: Secondary | ICD-10-CM | POA: Diagnosis not present

## 2016-08-19 DIAGNOSIS — R627 Adult failure to thrive: Secondary | ICD-10-CM | POA: Diagnosis present

## 2016-08-19 DIAGNOSIS — Z66 Do not resuscitate: Secondary | ICD-10-CM | POA: Diagnosis present

## 2016-08-19 DIAGNOSIS — R509 Fever, unspecified: Secondary | ICD-10-CM | POA: Diagnosis not present

## 2016-08-19 DIAGNOSIS — S065XAA Traumatic subdural hemorrhage with loss of consciousness status unknown, initial encounter: Secondary | ICD-10-CM

## 2016-08-19 LAB — CBC WITH DIFFERENTIAL/PLATELET
BASOS ABS: 0 10*3/uL (ref 0.0–0.1)
Basophils Relative: 0 %
Eosinophils Absolute: 0.4 10*3/uL (ref 0.0–0.7)
Eosinophils Relative: 3 %
HEMATOCRIT: 33.4 % — AB (ref 36.0–46.0)
HEMOGLOBIN: 10.7 g/dL — AB (ref 12.0–15.0)
LYMPHS PCT: 20 %
Lymphs Abs: 2.1 10*3/uL (ref 0.7–4.0)
MCH: 30.3 pg (ref 26.0–34.0)
MCHC: 32 g/dL (ref 30.0–36.0)
MCV: 94.6 fL (ref 78.0–100.0)
Monocytes Absolute: 0.9 10*3/uL (ref 0.1–1.0)
Monocytes Relative: 8 %
Neutro Abs: 7.3 10*3/uL (ref 1.7–7.7)
Neutrophils Relative %: 69 %
Platelets: 265 10*3/uL (ref 150–400)
RBC: 3.53 MIL/uL — ABNORMAL LOW (ref 3.87–5.11)
RDW: 14.2 % (ref 11.5–15.5)
WBC: 10.7 10*3/uL — ABNORMAL HIGH (ref 4.0–10.5)

## 2016-08-19 LAB — URINALYSIS, ROUTINE W REFLEX MICROSCOPIC
Bilirubin Urine: NEGATIVE
Glucose, UA: >=500 mg/dL — AB
Hgb urine dipstick: NEGATIVE
KETONES UR: NEGATIVE mg/dL
LEUKOCYTES UA: NEGATIVE
Nitrite: NEGATIVE
PH: 6 (ref 5.0–8.0)
Protein, ur: 30 mg/dL — AB
Specific Gravity, Urine: 1.013 (ref 1.005–1.030)

## 2016-08-19 LAB — I-STAT CG4 LACTIC ACID, ED
LACTIC ACID, VENOUS: 2.28 mmol/L — AB (ref 0.5–1.9)
Lactic Acid, Venous: 0.92 mmol/L (ref 0.5–1.9)

## 2016-08-19 LAB — COMPREHENSIVE METABOLIC PANEL
ALBUMIN: 3.6 g/dL (ref 3.5–5.0)
ALT: 14 U/L (ref 14–54)
ANION GAP: 9 (ref 5–15)
AST: 13 U/L — ABNORMAL LOW (ref 15–41)
Alkaline Phosphatase: 95 U/L (ref 38–126)
BILIRUBIN TOTAL: 0.4 mg/dL (ref 0.3–1.2)
BUN: 34 mg/dL — ABNORMAL HIGH (ref 6–20)
CHLORIDE: 102 mmol/L (ref 101–111)
CO2: 25 mmol/L (ref 22–32)
Calcium: 9.8 mg/dL (ref 8.9–10.3)
Creatinine, Ser: 1.45 mg/dL — ABNORMAL HIGH (ref 0.44–1.00)
GFR calc Af Amer: 36 mL/min — ABNORMAL LOW (ref >60)
GFR calc non Af Amer: 31 mL/min — ABNORMAL LOW (ref >60)
GLUCOSE: 302 mg/dL — AB (ref 65–99)
POTASSIUM: 4.5 mmol/L (ref 3.5–5.1)
Sodium: 136 mmol/L (ref 135–145)
TOTAL PROTEIN: 7 g/dL (ref 6.5–8.1)

## 2016-08-19 LAB — LACTIC ACID, PLASMA: Lactic Acid, Venous: 1.1 mmol/L (ref 0.5–1.9)

## 2016-08-19 LAB — GLUCOSE, CAPILLARY: GLUCOSE-CAPILLARY: 240 mg/dL — AB (ref 65–99)

## 2016-08-19 MED ORDER — VANCOMYCIN HCL IN DEXTROSE 1-5 GM/200ML-% IV SOLN
1000.0000 mg | Freq: Once | INTRAVENOUS | Status: AC
  Administered 2016-08-19: 1000 mg via INTRAVENOUS
  Filled 2016-08-19: qty 200

## 2016-08-19 MED ORDER — ADULT MULTIVITAMIN W/MINERALS CH
1.0000 | ORAL_TABLET | Freq: Every day | ORAL | Status: DC
  Administered 2016-08-20 – 2016-08-25 (×6): 1
  Filled 2016-08-19 (×6): qty 1

## 2016-08-19 MED ORDER — MAGNESIUM OXIDE 400 (241.3 MG) MG PO TABS
400.0000 mg | ORAL_TABLET | Freq: Every day | ORAL | Status: DC
  Administered 2016-08-20 – 2016-08-25 (×6): 400 mg
  Filled 2016-08-19 (×6): qty 1

## 2016-08-19 MED ORDER — VANCOMYCIN HCL IN DEXTROSE 750-5 MG/150ML-% IV SOLN
750.0000 mg | INTRAVENOUS | Status: DC
  Administered 2016-08-20 – 2016-08-23 (×4): 750 mg via INTRAVENOUS
  Filled 2016-08-19 (×5): qty 150

## 2016-08-19 MED ORDER — LEVOTHYROXINE SODIUM 50 MCG PO TABS
50.0000 ug | ORAL_TABLET | Freq: Every day | ORAL | Status: DC
  Administered 2016-08-20 – 2016-08-25 (×6): 50 ug
  Filled 2016-08-19 (×6): qty 1

## 2016-08-19 MED ORDER — PIPERACILLIN-TAZOBACTAM 3.375 G IVPB 30 MIN
3.3750 g | Freq: Once | INTRAVENOUS | Status: AC
  Administered 2016-08-19: 3.375 g via INTRAVENOUS
  Filled 2016-08-19 (×2): qty 50

## 2016-08-19 MED ORDER — FAMOTIDINE 20 MG PO TABS
20.0000 mg | ORAL_TABLET | Freq: Every day | ORAL | Status: DC
  Administered 2016-08-20 – 2016-08-25 (×6): 20 mg
  Filled 2016-08-19 (×7): qty 1

## 2016-08-19 MED ORDER — AMLODIPINE BESYLATE 5 MG PO TABS
10.0000 mg | ORAL_TABLET | Freq: Every day | ORAL | Status: DC
  Administered 2016-08-20 – 2016-08-25 (×6): 10 mg
  Filled 2016-08-19 (×6): qty 2

## 2016-08-19 MED ORDER — ASPIRIN EC 81 MG PO TBEC
81.0000 mg | DELAYED_RELEASE_TABLET | Freq: Every day | ORAL | Status: DC
  Administered 2016-08-19 – 2016-08-25 (×7): 81 mg via ORAL
  Filled 2016-08-19 (×7): qty 1

## 2016-08-19 MED ORDER — ATORVASTATIN CALCIUM 20 MG PO TABS
20.0000 mg | ORAL_TABLET | Freq: Every day | ORAL | Status: DC
  Administered 2016-08-19 – 2016-08-24 (×6): 20 mg
  Filled 2016-08-19 (×8): qty 1

## 2016-08-19 MED ORDER — VITAMIN D3 25 MCG (1000 UNIT) PO TABS
1000.0000 [IU] | ORAL_TABLET | Freq: Every day | ORAL | Status: DC
  Filled 2016-08-19: qty 1

## 2016-08-19 MED ORDER — ACETAMINOPHEN 325 MG PO TABS
650.0000 mg | ORAL_TABLET | ORAL | Status: DC | PRN
  Administered 2016-08-20 – 2016-08-24 (×4): 650 mg via ORAL
  Filled 2016-08-19 (×4): qty 2

## 2016-08-19 MED ORDER — ACETAMINOPHEN 325 MG PO TABS
650.0000 mg | ORAL_TABLET | Freq: Once | ORAL | Status: AC
  Administered 2016-08-19: 650 mg via ORAL
  Filled 2016-08-19: qty 2

## 2016-08-19 MED ORDER — SODIUM CHLORIDE 0.9% FLUSH
3.0000 mL | INTRAVENOUS | Status: DC | PRN
  Administered 2016-08-22: 3 mL via INTRAVENOUS
  Filled 2016-08-19: qty 3

## 2016-08-19 MED ORDER — NYSTATIN 100000 UNIT/GM EX POWD
Freq: Two times a day (BID) | CUTANEOUS | Status: DC | PRN
  Administered 2016-08-19: via TOPICAL
  Filled 2016-08-19: qty 15

## 2016-08-19 MED ORDER — FLUTICASONE PROPIONATE 50 MCG/ACT NA SUSP
2.0000 | Freq: Every day | NASAL | Status: DC
  Administered 2016-08-20 – 2016-08-25 (×5): 2 via NASAL
  Filled 2016-08-19: qty 16

## 2016-08-19 MED ORDER — PIPERACILLIN-TAZOBACTAM 3.375 G IVPB
3.3750 g | Freq: Three times a day (TID) | INTRAVENOUS | Status: DC
  Administered 2016-08-20 – 2016-08-25 (×16): 3.375 g via INTRAVENOUS
  Filled 2016-08-19 (×16): qty 50

## 2016-08-19 MED ORDER — HYDRALAZINE HCL 25 MG PO TABS
25.0000 mg | ORAL_TABLET | Freq: Three times a day (TID) | ORAL | Status: DC
  Administered 2016-08-19 – 2016-08-25 (×17): 25 mg
  Filled 2016-08-19 (×18): qty 1

## 2016-08-19 MED ORDER — INSULIN ASPART 100 UNIT/ML ~~LOC~~ SOLN
0.0000 [IU] | Freq: Every day | SUBCUTANEOUS | Status: DC
  Administered 2016-08-19 – 2016-08-21 (×2): 2 [IU] via SUBCUTANEOUS

## 2016-08-19 MED ORDER — SODIUM CHLORIDE 0.9% FLUSH
3.0000 mL | Freq: Two times a day (BID) | INTRAVENOUS | Status: DC
  Administered 2016-08-19 – 2016-08-25 (×3): 3 mL via INTRAVENOUS

## 2016-08-19 MED ORDER — ZINC OXIDE 40 % EX OINT
TOPICAL_OINTMENT | Freq: Once | CUTANEOUS | Status: DC
  Filled 2016-08-19: qty 114

## 2016-08-19 MED ORDER — SODIUM CHLORIDE 0.9 % IV BOLUS (SEPSIS)
1000.0000 mL | Freq: Once | INTRAVENOUS | Status: AC
  Administered 2016-08-19 (×2): 1000 mL via INTRAVENOUS

## 2016-08-19 MED ORDER — NYSTATIN 100000 UNIT/GM EX POWD
Freq: Three times a day (TID) | CUTANEOUS | Status: DC
  Administered 2016-08-20 – 2016-08-25 (×16): via TOPICAL
  Filled 2016-08-19 (×3): qty 15

## 2016-08-19 MED ORDER — SODIUM CHLORIDE 0.9 % IV BOLUS (SEPSIS)
1000.0000 mL | Freq: Once | INTRAVENOUS | Status: AC
  Administered 2016-08-19: 1000 mL via INTRAVENOUS

## 2016-08-19 MED ORDER — INSULIN ASPART 100 UNIT/ML ~~LOC~~ SOLN
0.0000 [IU] | Freq: Three times a day (TID) | SUBCUTANEOUS | Status: DC
  Administered 2016-08-20 – 2016-08-21 (×5): 2 [IU] via SUBCUTANEOUS
  Administered 2016-08-22: 3 [IU] via SUBCUTANEOUS
  Administered 2016-08-22 – 2016-08-23 (×2): 1 [IU] via SUBCUTANEOUS
  Administered 2016-08-23: 2 [IU] via SUBCUTANEOUS
  Administered 2016-08-24: 3 [IU] via SUBCUTANEOUS
  Administered 2016-08-24: 1 [IU] via SUBCUTANEOUS
  Administered 2016-08-24 – 2016-08-25 (×2): 2 [IU] via SUBCUTANEOUS

## 2016-08-19 MED ORDER — SODIUM CHLORIDE 0.9 % IV SOLN
250.0000 mL | INTRAVENOUS | Status: DC | PRN
  Administered 2016-08-20: 250 mL via INTRAVENOUS

## 2016-08-19 NOTE — ED Provider Notes (Signed)
Medical screening examination/treatment/procedure(s) were conducted as a shared visit with non-physician practitioner(s) and myself.  I personally evaluated the patient during the encounter.   EKG Interpretation  Date/Time:  Thursday August 19 2016 17:08:26 EDT Ventricular Rate:  93 PR Interval:    QRS Duration: 131 QT Interval:  369 QTC Calculation: 459 R Axis:   -76 Text Interpretation:  Atrial fibrillation Right bundle branch block Inferior infarct, old no prior EKG  Confirmed by Brantley Stage [phone]) on 08/19/2016 6:09:57 PM      83 year old female who presents with fever. She is from [place]. She has a history of dementia and diabetes. Additional history is also obtained from patient's daughter who is at bedside. Patient with 2 days of generalized weakness, and today with chills and shaking. Found to have a fever of 102.1, and brought to the ED for evaluation. She has recently finished treatment for presumptive UTI. Her daughter has noticed that she did have chest congestion and hoarseness one day ago, but that had resolved. No chest pain, difficulty breathing, cough, abdominal pain, nausea, vomiting, or diarrhea.  She is febrile in the ED to [place]. Near tachycardic but normotensive and in no respiratory distress. Sepsis workup shows mild leukocytosis and mildly elevated lactate of 2.28. Her urinalysis does not show convincing infection. Her chest x-ray is visualized with questionable infiltrate versus atelectasis in the right lower lobe. Suspect that this is likely related to pneumonia. Is empirically treated with vancomycin and Zosyn. We will admit to hospitalist service for ongoing treatment and monitoring.    Forde Dandy, MD 08/19/16 [id number]

## 2016-08-19 NOTE — Progress Notes (Signed)
Dr. On call paged d/t MASD around groin and under breasts. Rn asked for Nystatin cream. New order for Nystatin Powder TID.

## 2016-08-19 NOTE — ED Notes (Signed)
Attempting to get IV. Unsuccessful with 2 attempts do to pt shaking

## 2016-08-19 NOTE — Progress Notes (Signed)
Pharmacy Antibiotic Note  Regina Bush is a 83 y.o. female admitted on 08/19/2016 with sepsis.  Pharmacy has been consulted for Vancomycin and Zosyn dosing.  Plan: Vancomycin 1gm IV and Zosyn 3.375gm IV x 1. Zosyn 3.375gm IV every 8 hours. Follow-up micro data, labs, vitals.  Vancomycin 750 mg IV every 24 hours.  Goal trough 15-20 mcg/mL. Zosyn 3.375g IV q8h (4 hour infusion).     Temp (24hrs), Avg:101.4 F (38.6 C), Min:101.4 F (38.6 C), Max:101.4 F (38.6 C)   Recent Labs Lab 08/19/16 1704 08/19/16 1722  WBC 10.7*  --   CREATININE 1.45*  --   LATICACIDVEN  --  2.28*    Estimated Creatinine Clearance: 24.6 mL/min (A) (by C-G formula based on SCr of 1.45 mg/dL (H)).    Allergies  Allergen Reactions  . Cymbalta [Duloxetine Hcl] Other (See Comments)    unknown  . Effexor [Venlafaxine] Other (See Comments)    unknown  . Morphine And Related Other (See Comments)    unknown  . Tape     adhesive   Antimicrobials this admission: Vanc 6/7 >>  Zosyn 6/7 >>   Dose adjustments this admission: n/a   Microbiology results: 6/7 BCx: pending 6/7 UCx: pending   Thank you for allowing pharmacy to be a part of this patient's care.  Pricilla Larsson 08/19/2016 6:32 PM

## 2016-08-19 NOTE — H&P (Signed)
History and Physical    Regina Bush VZD:[id number] DOB: 11/03/1927 DOA: 08/19/2016  PCP: Sinda Du, MD  Patient coming from: assisted living  Chief Complaint:  fever  HPI: Regina Bush is a 83 y.o. female with medical history significant of dementia, wheelchair bound, recent uti on amoxicillin (9days) CAD, DM, HTN, FTT comes in with 2 days of fever.  She complains of sob and cough.  dtr is present and only knows she has been running fever.  No other history can be reliably obtained. Found to have possible pna.  Review of Systems: unobtainable due to her dementia  Past Medical History:  Diagnosis Date  . Anemia   . Arthritis   . Cerebral infarction ([place])   . Coronary artery disease   . Dementia   . Diabetes mellitus without complication (HCC)    Type 2  . Dysphagia due to recent cerebral infarction   . Failure to thrive (0-17)   . Hypercalcemia   . Hypertension   . Pneumonitis   . Protein calorie malnutrition ([place])   . Subdural hematoma ([place])   . Thyroid disease     History reviewed. No pertinent surgical history.   has an unknown smoking status. She has never used smokeless tobacco. Her alcohol and drug histories are not on file.  Allergies  Allergen Reactions  . Cymbalta [Duloxetine Hcl] Other (See Comments)    unknown  . Effexor [Venlafaxine] Other (See Comments)    unknown  . Morphine And Related Other (See Comments)    unknown  . Tape     adhesive    No family history on file. unknown  Prior to Admission medications   Medication Sig Start Date End Date Taking? Authorizing Provider  acetaminophen (TYLENOL) 325 MG tablet Take 650 mg by mouth every 4 (four) hours as needed for mild pain or moderate pain.   Yes [provider]  amLODipine (NORVASC) 10 MG tablet Place 10 mg into feeding tube daily.   Yes [provider]  amoxicillin (AMOXIL) 500 MG capsule Take 1 capsule by mouth 3 (three) times daily. 08/13/16 08/19/16 Yes [provider]  aspirin EC 81 MG tablet Take 81 mg by mouth daily.   Yes [provider]  atorvastatin (LIPITOR) 20 MG tablet Place 20 mg into feeding tube daily.   Yes [provider]  cholecalciferol (VITAMIN D) 1000 units tablet Place 1,000 Units into feeding tube daily.   Yes [provider]  famotidine (PEPCID) 20 MG tablet Place 20 mg into feeding tube daily.   Yes [provider]  fluticasone (FLONASE) 50 MCG/ACT nasal spray Place 2 sprays into both nostrils daily.   Yes [provider]  hydrALAZINE (APRESOLINE) 25 MG tablet Place 25 mg into feeding tube 3 (three) times daily.   Yes [provider]  levothyroxine (SYNTHROID, LEVOTHROID) 50 MCG tablet Place 50 mcg into feeding tube daily before breakfast.   Yes [provider]  magnesium oxide (MAG-OX) 400 MG tablet Place 400 mg into feeding tube daily.   Yes [provider]  Multiple Vitamin (MULTIVITAMIN WITH MINERALS) TABS tablet Place 1 tablet into feeding tube daily.   Yes [provider]  nystatin (MYCOSTATIN/NYSTOP) powder Apply topically 2 (two) times daily as needed (for redness).   Yes [provider]  Wheat Dextrin (BENEFIBER PO) 10 mLs by Gastric Tube route daily.   Yes [provider]    Physical Exam: Vitals:   08/19/16 1700 08/19/16 1721 08/19/16 1900 08/19/16  1929  BP: (!) 142/118 (!) 142/118 131/87   Pulse: 93 93  84  Resp: 19 (!) 22 (!) 27 18  Temp:  (!) 101.4 F (38.6 C)  100.3 F (37.9 C)  TempSrc:  Rectal  Oral  SpO2: 96% 98%  94%      Constitutional: NAD, calm, comfortable Vitals:   08/19/16 1700 08/19/16 1721 08/19/16 1900 08/19/16 1929  BP: (!) 142/118 (!) 142/118 131/87   Pulse: 93 93  84  Resp: 19 (!) 22 (!) 27 18  Temp:  (!) 101.4 F (38.6 C)  100.3 F (37.9 C)  TempSrc:  Rectal  Oral  SpO2: 96% 98%  94%   Eyes: PERRL, lids and conjunctivae normal ENMT: Mucous membranes are moist. Posterior  pharynx clear of any exudate or lesions.Normal dentition.  Neck: normal, supple, no masses, no thyromegaly Respiratory: clear to auscultation bilaterally, no wheezing, no crackles. Normal respiratory effort. No accessory muscle use.  Cardiovascular: Regular rate and rhythm, no murmurs / rubs / gallops. No extremity edema. 2+ pedal pulses. No carotid bruits.  Abdomen: no tenderness, no masses palpated. No hepatosplenomegaly. Bowel sounds positive.  Musculoskeletal: no clubbing / cyanosis. No joint deformity upper and lower extremities. Good ROM, no contractures. Normal muscle tone.  Skin: no rashes, lesions, ulcers. No induration Neurologic: CN 2-12 grossly intact. Sensation intact, DTR normal. Strength 5/5 in all 4.  Psychiatric: normal mood, not agitated  Labs on Admission: I have personally reviewed following labs and imaging studies  CBC:  Recent Labs Lab 08/19/16 1704  WBC 10.7*  NEUTROABS 7.3  HGB 10.7*  HCT 33.4*  MCV 94.6  PLT 732   Basic Metabolic Panel:  Recent Labs Lab 08/19/16 1704  NA 136  K 4.5  CL 102  CO2 25  GLUCOSE 302*  BUN 34*  CREATININE 1.45*  CALCIUM 9.8   GFR: Estimated Creatinine Clearance: 24.6 mL/min (A) (by C-G formula based on SCr of 1.45 mg/dL (H)). Liver Function Tests:  Recent Labs Lab 08/19/16 1704  AST 13*  ALT 14  ALKPHOS 95  BILITOT 0.4  PROT 7.0  ALBUMIN 3.6    Urine analysis:    Component Value Date/Time   COLORURINE YELLOW 08/19/2016 1650   APPEARANCEUR CLEAR 08/19/2016 1650   LABSPEC 1.013 08/19/2016 1650   PHURINE 6.0 08/19/2016 1650   GLUCOSEU >=500 (A) 08/19/2016 1650   HGBUR NEGATIVE 08/19/2016 1650   BILIRUBINUR NEGATIVE 08/19/2016 1650   [place] 08/19/2016 1650   PROTEINUR 30 (A) 08/19/2016 1650   NITRITE NEGATIVE 08/19/2016 1650   LEUKOCYTESUR NEGATIVE 08/19/2016 1650    Recent Results (from the past 240 hour(s))  Blood Culture (routine x 2)     Status: None (Preliminary result)    Collection Time: 08/19/16  5:05 PM  Result Value Ref Range Status   Specimen Description LEFT ANTECUBITAL  Final   Special Requests   Final    BOTTLES DRAWN AEROBIC AND ANAEROBIC Blood Culture adequate volume   Culture PENDING  Incomplete   Report Status PENDING  Incomplete  Blood Culture (routine x 2)     Status: None (Preliminary result)   Collection Time: 08/19/16  5:13 PM  Result Value Ref Range Status   Specimen Description BLOOD RIGHT HAND  Final   Special Requests   Final    BOTTLES DRAWN AEROBIC AND ANAEROBIC Blood Culture adequate volume   Culture PENDING  Incomplete   Report Status PENDING  Incomplete     Radiological Exams on Admission: Dg Chest  Portable 1 View  Result Date: 08/19/2016 CLINICAL DATA:  Shortness of breath EXAM: PORTABLE CHEST 1 VIEW COMPARISON:  None. FINDINGS: Mild to moderate cardiomegaly without overt pulmonary edema. Mild atelectasis or infiltrate at the right base. No large pleural effusion. Aortic atherosclerosis. No pneumothorax. IMPRESSION: 1. Mild atelectasis or infiltrate at the right base 2. Cardiomegaly without edema Electronically Signed   By: Donavan Foil M.D.   On: 08/19/2016 18:16    Assessment/Plan 83 yo female from assisted living on recent abx for uti comes in with fever likely source HCAP  Principal Problem:   HCAP (healthcare-associated pneumonia)- iv vanc and zosyn.  Blood cx pending.  Obtain sputum cx.  Serial lactic acid q 3 hours.  Given over 2 liters in ED, lactate initially 2.2.  Vitals stable.    Active Problems:   Subdural hematoma ([place])- unclear when this occurred after full review of her chart, she is newly moved here.  Will hold lovenox for this reason until clear when she had this subdural   Hypertension- cont home meds   Diabetes mellitus without complication ([place])- SSI, does not appear to be on oral agents chronically, sugar over 300 here   Coronary artery disease- noted   PCP dr Luan Pulling   DVT prophylaxis: scds  Code  Status:  DNR confirmed with daughter at bedside Family Communication:  daughter Disposition Plan:  Per day team Consults called:  none Admission status:  admission   Taleen Prosser A MD [facility]  If 7PM-7AM, please contact night-coverage [email or web address] Password [email or web address]  08/19/2016, 8:20 PM

## 2016-08-19 NOTE — Plan of Care (Signed)
Problem: Skin Integrity: Goal: Risk for impaired skin integrity will decrease Outcome: Progressing Pt admitted to room 311 from ED, when admitted, foam placed on bottom d/t redness and preventative measures. Bruising on bilateral arms, old surgical scars in groin and under both breasts. Nystatin applied.

## 2016-08-19 NOTE — ED Provider Notes (Signed)
[facility] DEPT Provider Note   CSN: [id number] Arrival date & time: 08/19/16  1636     History   Chief Complaint Chief Complaint  Patient presents with  . Fever    HPI Joli Koob is a 83 y.o. female , full code status with a history of CAD, DM, HTN and dementia presenting from her nursing home for evaluation of fever.  She is currently on a 10 day course of amoxil (day 9) for a uti and was found to be running a fever today. She denies nausea,vomiting, abdominal pain, sob or chest pain but does endorse painful urination and states she had a headache earlier today and also has been coughing.  She is not aware she had a fever prior to presenting here and is otherwise without complaint.   The history is provided by the patient, the nursing home and the EMS personnel. The history is limited by the condition of the patient.    Past Medical History:  Diagnosis Date  . Anemia   . Arthritis   . Cerebral infarction ([place])   . Coronary artery disease   . Dementia   . Diabetes mellitus without complication (HCC)    Type 2  . Dysphagia due to recent cerebral infarction   . Failure to thrive (0-17)   . Hypercalcemia   . Hypertension   . Pneumonitis   . Protein calorie malnutrition ([place])   . Subdural hematoma ([place])   . Thyroid disease     Patient Active Problem List   Diagnosis Date Noted  . HCAP (healthcare-associated pneumonia) 08/19/2016    History reviewed. No pertinent surgical history.  OB History    No data available       Home Medications    Prior to Admission medications   Medication Sig Start Date End Date Taking? Authorizing Provider  acetaminophen (TYLENOL) 325 MG tablet Take 650 mg by mouth every 4 (four) hours as needed for mild pain or moderate pain.   Yes [provider]  amLODipine (NORVASC) 10 MG tablet Place 10 mg into feeding tube daily.   Yes [provider]  amoxicillin (AMOXIL) 500 MG capsule Take 1 capsule by mouth 3 (three)  times daily. 08/13/16 08/19/16 Yes [provider]  aspirin EC 81 MG tablet Take 81 mg by mouth daily.   Yes [provider]  atorvastatin (LIPITOR) 20 MG tablet Place 20 mg into feeding tube daily.   Yes [provider]  cholecalciferol (VITAMIN D) 1000 units tablet Place 1,000 Units into feeding tube daily.   Yes [provider]  famotidine (PEPCID) 20 MG tablet Place 20 mg into feeding tube daily.   Yes [provider]  fluticasone (FLONASE) 50 MCG/ACT nasal spray Place 2 sprays into both nostrils daily.   Yes [provider]  hydrALAZINE (APRESOLINE) 25 MG tablet Place 25 mg into feeding tube 3 (three) times daily.   Yes [provider]  levothyroxine (SYNTHROID, LEVOTHROID) 50 MCG tablet Place 50 mcg into feeding tube daily before breakfast.   Yes [provider]  magnesium oxide (MAG-OX) 400 MG tablet Place 400 mg into feeding tube daily.   Yes [provider]  Multiple Vitamin (MULTIVITAMIN WITH MINERALS) TABS tablet Place 1 tablet into feeding tube daily.   Yes [provider]  nystatin (MYCOSTATIN/NYSTOP) powder Apply topically 2 (two) times daily as needed (for redness).   Yes [provider]  Wheat Dextrin (BENEFIBER PO) 10 mLs by Gastric Tube route daily.  Yes [provider]    Family History No family history on file.  Social History Social History  Substance Use Topics  . Smoking status: Unknown If Ever Smoked  . Smokeless tobacco: Never Used  . Alcohol use Not on file     Allergies   Cymbalta [duloxetine hcl]; Effexor [venlafaxine]; Morphine and related; and Tape   Review of Systems Review of Systems  Constitutional: Positive for fever.  HENT: Negative for sore throat.   Respiratory: Positive for cough. Negative for shortness of breath.   Cardiovascular: Negative.   Gastrointestinal: Negative for abdominal pain, nausea and vomiting.  Genitourinary: Positive for  dysuria.  Neurological: Positive for headaches.     Physical Exam Updated Vital Signs BP 131/87   Pulse 84   Temp 100.3 F (37.9 C) (Oral)   Resp 18   SpO2 94%   Physical Exam  Constitutional: She appears well-developed and well-nourished.  HENT:  Head: Normocephalic and atraumatic.  Eyes: Conjunctivae are normal.  Neck: Neck supple.  Cardiovascular: Normal rate, regular rhythm, normal heart sounds and intact distal pulses.   Pulmonary/Chest: Effort normal and breath sounds normal. She has no wheezes.  Abdominal: Soft. Bowel sounds are normal. She exhibits no mass. There is no tenderness. There is no guarding.  Healed abdominal incisions.  Musculoskeletal: Normal range of motion.  Neurological: She is alert.  Skin: Skin is warm and dry.  Erythema and several excoriated lesions perineum.  Psychiatric: She has a normal mood and affect.  Nursing note and vitals reviewed.    ED Treatments / Results  Labs (all labs ordered are listed, but only abnormal results are displayed) Labs Reviewed  COMPREHENSIVE METABOLIC PANEL - Abnormal; Notable for the following:       Result Value   Glucose, Bld 302 (*)    BUN 34 (*)    Creatinine, Ser 1.45 (*)    AST 13 (*)    GFR calc non Af Amer 31 (*)    GFR calc Af Amer 36 (*)    All other components within normal limits  CBC WITH DIFFERENTIAL/PLATELET - Abnormal; Notable for the following:    WBC 10.7 (*)    RBC 3.53 (*)    Hemoglobin 10.7 (*)    HCT 33.4 (*)    All other components within normal limits  URINALYSIS, ROUTINE W REFLEX MICROSCOPIC - Abnormal; Notable for the following:    Glucose, UA >=500 (*)    Protein, ur 30 (*)    Bacteria, UA RARE (*)    Squamous Epithelial / LPF 0-5 (*)    All other components within normal limits  I-STAT CG4 LACTIC ACID, ED - Abnormal; Notable for the following:    Lactic Acid, Venous 2.28 (*)    All other components within normal limits  CULTURE, BLOOD (ROUTINE X 2)  CULTURE, BLOOD  (ROUTINE X 2)  URINE CULTURE  BASIC METABOLIC PANEL  I-STAT CG4 LACTIC ACID, ED    EKG  EKG Interpretation  Date/Time:  Thursday August 19 2016 17:08:26 EDT Ventricular Rate:  93 PR Interval:    QRS Duration: 131 QT Interval:  369 QTC Calculation: 459 R Axis:   -76 Text Interpretation:  Atrial fibrillation Right bundle branch block Inferior infarct, old no prior EKG  Confirmed by Brantley Stage [phone]) on 08/19/2016 6:09:07 PM       Radiology Dg Chest Portable 1 View  Result Date: 08/19/2016 CLINICAL DATA:  Shortness of breath EXAM: PORTABLE CHEST 1 VIEW COMPARISON:  None.  FINDINGS: Mild to moderate cardiomegaly without overt pulmonary edema. Mild atelectasis or infiltrate at the right base. No large pleural effusion. Aortic atherosclerosis. No pneumothorax. IMPRESSION: 1. Mild atelectasis or infiltrate at the right base 2. Cardiomegaly without edema Electronically Signed   By: Donavan Foil M.D.   On: 08/19/2016 18:16    Procedures Procedures (including critical care time)  Medications Ordered in ED Medications  liver oil-zinc oxide (DESITIN) 40 % ointment (not administered)  vancomycin (VANCOCIN) IVPB 1000 mg/200 mL premix (1,000 mg Intravenous New Bag/Given 08/19/16 1923)  piperacillin-tazobactam (ZOSYN) IVPB 3.375 g (not administered)  vancomycin (VANCOCIN) IVPB 750 mg/150 ml premix (not administered)  piperacillin-tazobactam (ZOSYN) IVPB 3.375 g (not administered)  sodium chloride 0.9 % bolus 1,000 mL (1,000 mLs Intravenous New Bag/Given 08/19/16 1831)  acetaminophen (TYLENOL) tablet 650 mg (650 mg Oral Given 08/19/16 1829)  sodium chloride 0.9 % bolus 1,000 mL (0 mLs Intravenous Stopped 08/19/16 1950)     Initial Impression / Assessment and Plan / ED Course  I have reviewed the triage vital signs and the nursing notes.  Pertinent labs & imaging results that were available during my care of the patient were reviewed by me and considered in my medical decision making (see chart for  details).     Labs reviewed, with elevated lactate.  Urine clear, pending cxr at this time.  Empiric abx ordered, pending source of infection.  IV fluids 1 L bolus instituted. Will repeat x 1.  cxr suggesting probable HAP, discussed with Dr. Shanon Brow who accepts for admission.    Final Clinical Impressions(s) / ED Diagnoses   Final diagnoses:  Healthcare-associated pneumonia    New Prescriptions New Prescriptions   No medications on file     Landis Martins 08/19/16 1959    Forde Dandy, MD 08/20/16 [phone]

## 2016-08-19 NOTE — ED Triage Notes (Signed)
Pt is presenting with [employer]. Has been treated for a UTI with an antibiotic and took last dose this week. Upon arrival EMS stated Tympanic Temp was 102.1.   All other VSS

## 2016-08-20 ENCOUNTER — Encounter (HOSPITAL_COMMUNITY): Payer: Self-pay

## 2016-08-20 LAB — CBC WITH DIFFERENTIAL/PLATELET
BASOS ABS: 0 10*3/uL (ref 0.0–0.1)
Basophils Relative: 0 %
EOS PCT: 4 %
Eosinophils Absolute: 0.4 10*3/uL (ref 0.0–0.7)
HEMATOCRIT: 31.9 % — AB (ref 36.0–46.0)
Hemoglobin: 10.4 g/dL — ABNORMAL LOW (ref 12.0–15.0)
LYMPHS ABS: 1.9 10*3/uL (ref 0.7–4.0)
LYMPHS PCT: 21 %
MCH: 30.5 pg (ref 26.0–34.0)
MCHC: 32.6 g/dL (ref 30.0–36.0)
MCV: 93.5 fL (ref 78.0–100.0)
MONO ABS: 0.6 10*3/uL (ref 0.1–1.0)
Monocytes Relative: 6 %
NEUTROS ABS: 6.3 10*3/uL (ref 1.7–7.7)
Neutrophils Relative %: 69 %
PLATELETS: 249 10*3/uL (ref 150–400)
RBC: 3.41 MIL/uL — AB (ref 3.87–5.11)
RDW: 14.6 % (ref 11.5–15.5)
WBC: 9.2 10*3/uL (ref 4.0–10.5)

## 2016-08-20 LAB — BASIC METABOLIC PANEL
ANION GAP: 7 (ref 5–15)
BUN: 27 mg/dL — ABNORMAL HIGH (ref 6–20)
CO2: 23 mmol/L (ref 22–32)
Calcium: 9 mg/dL (ref 8.9–10.3)
Chloride: 108 mmol/L (ref 101–111)
Creatinine, Ser: 1.34 mg/dL — ABNORMAL HIGH (ref 0.44–1.00)
GFR, EST AFRICAN AMERICAN: 40 mL/min — AB (ref >60)
GFR, EST NON AFRICAN AMERICAN: 35 mL/min — AB (ref >60)
Glucose, Bld: 213 mg/dL — ABNORMAL HIGH (ref 65–99)
POTASSIUM: 3.9 mmol/L (ref 3.5–5.1)
Sodium: 138 mmol/L (ref 135–145)

## 2016-08-20 LAB — GLUCOSE, CAPILLARY
GLUCOSE-CAPILLARY: 158 mg/dL — AB (ref 65–99)
Glucose-Capillary: 194 mg/dL — ABNORMAL HIGH (ref 65–99)
Glucose-Capillary: 176 mg/dL — ABNORMAL HIGH (ref 65–99)
Glucose-Capillary: 108 mg/dL — ABNORMAL HIGH (ref 65–99)

## 2016-08-20 LAB — MRSA PCR SCREENING: MRSA by PCR: POSITIVE — AB

## 2016-08-20 LAB — STREP PNEUMONIAE URINARY ANTIGEN: Strep Pneumo Urinary Antigen: NEGATIVE

## 2016-08-20 MED ORDER — VITAMIN D 1000 UNITS PO TABS
1000.0000 [IU] | ORAL_TABLET | Freq: Every day | ORAL | Status: DC
  Administered 2016-08-20 – 2016-08-25 (×2): 1000 [IU] via ORAL
  Filled 2016-08-20 (×4): qty 1

## 2016-08-20 MED ORDER — CHLORHEXIDINE GLUCONATE CLOTH 2 % EX PADS
6.0000 | MEDICATED_PAD | Freq: Every day | CUTANEOUS | Status: AC
  Administered 2016-08-20 – 2016-08-24 (×4): 6 via TOPICAL

## 2016-08-20 MED ORDER — MUPIROCIN 2 % EX OINT
1.0000 "application " | TOPICAL_OINTMENT | Freq: Two times a day (BID) | CUTANEOUS | Status: AC
  Administered 2016-08-20 – 2016-08-24 (×10): 1 via NASAL
  Filled 2016-08-20: qty 22

## 2016-08-20 NOTE — Progress Notes (Signed)
Subjective: She was admitted yesterday with healthcare associated pneumonia. She's being treated and says she feels much better. She has no new complaints.  Objective: Vital signs in last 24 hours: Temp:  [99 F (37.2 C)-101.4 F (38.6 C)] 99.1 F (37.3 C) (06/08 0455) Pulse Rate:  [75-93] 78 (06/08 0455) Resp:  [18-27] 18 (06/08 0455) BP: (131-153)/(50-118) 153/50 (06/08 0455) SpO2:  [94 %-98 %] 97 % (06/08 0455) Weight:  [67.6 kg (149 lb)-68.1 kg (150 lb 3.2 oz)] 67.6 kg (149 lb) (06/07 2208) Weight change:  Last BM Date: 08/18/16  Intake/Output from previous day: 06/07 0701 - 06/08 0700 In: 1467 [P.O.:200; I.V.:17; IV Piggyback:1250] Out: -   PHYSICAL EXAM General appearance: alert, cooperative and no distress Resp: rhonchi bilaterally Cardio: regular rate and rhythm, S1, S2 normal, no murmur, click, rub or gallop GI: soft, non-tender; bowel sounds normal; no masses,  no organomegaly Extremities: The erythema of her legs looks better Mucous membranes are moist  Lab Results:  Results for orders placed or performed during the hospital encounter of 08/19/16 (from the past 48 hour(s))  Urinalysis, Routine w reflex microscopic     Status: Abnormal   Collection Time: 08/19/16  4:50 PM  Result Value Ref Range   Color, Urine YELLOW YELLOW   APPearance CLEAR CLEAR   Specific Gravity, Urine 1.013 1.005 - 1.030   pH 6.0 5.0 - 8.0   Glucose, UA >=500 (A) NEGATIVE mg/dL   Hgb urine dipstick NEGATIVE NEGATIVE   Bilirubin Urine NEGATIVE NEGATIVE   Ketones, ur NEGATIVE NEGATIVE mg/dL   Protein, ur 30 (A) NEGATIVE mg/dL   Nitrite NEGATIVE NEGATIVE   Leukocytes, UA NEGATIVE NEGATIVE   RBC / HPF 0-5 0 - 5 RBC/hpf   WBC, UA 0-5 0 - 5 WBC/hpf   Bacteria, UA RARE (A) NONE SEEN   Squamous Epithelial / LPF 0-5 (A) NONE SEEN  Comprehensive metabolic panel     Status: Abnormal   Collection Time: 08/19/16  5:04 PM  Result Value Ref Range   Sodium 136 135 - 145 mmol/L   Potassium 4.5  3.5 - 5.1 mmol/L   Chloride 102 101 - 111 mmol/L   CO2 25 22 - 32 mmol/L   Glucose, Bld 302 (H) 65 - 99 mg/dL   BUN 34 (H) 6 - 20 mg/dL   Creatinine, Ser 1.45 (H) 0.44 - 1.00 mg/dL   Calcium 9.8 8.9 - 10.3 mg/dL   Total Protein 7.0 6.5 - 8.1 g/dL   Albumin 3.6 3.5 - 5.0 g/dL   AST 13 (L) 15 - 41 U/L   ALT 14 14 - 54 U/L   Alkaline Phosphatase 95 38 - 126 U/L   Total Bilirubin 0.4 0.3 - 1.2 mg/dL   GFR calc non Af Amer 31 (L) >60 mL/min   GFR calc Af Amer 36 (L) >60 mL/min    Comment: (NOTE) The eGFR has been calculated using the CKD EPI equation. This calculation has not been validated in all clinical situations. eGFR's persistently <60 mL/min signify possible Chronic Kidney Disease.    Anion gap 9 5 - 15  CBC WITH DIFFERENTIAL     Status: Abnormal   Collection Time: 08/19/16  5:04 PM  Result Value Ref Range   WBC 10.7 (H) 4.0 - 10.5 K/uL   RBC 3.53 (L) 3.87 - 5.11 MIL/uL   Hemoglobin 10.7 (L) 12.0 - 15.0 g/dL   HCT 33.4 (L) 36.0 - 46.0 %   MCV 94.6 78.0 - 100.0 fL  MCH 30.3 26.0 - 34.0 pg   MCHC 32.0 30.0 - 36.0 g/dL   RDW 14.2 11.5 - 15.5 %   Platelets 265 150 - 400 K/uL   Neutrophils Relative % 69 %   Neutro Abs 7.3 1.7 - 7.7 K/uL   Lymphocytes Relative 20 %   Lymphs Abs 2.1 0.7 - 4.0 K/uL   Monocytes Relative 8 %   Monocytes Absolute 0.9 0.1 - 1.0 K/uL   Eosinophils Relative 3 %   Eosinophils Absolute 0.4 0.0 - 0.7 K/uL   Basophils Relative 0 %   Basophils Absolute 0.0 0.0 - 0.1 K/uL  Blood Culture (routine x 2)     Status: None (Preliminary result)   Collection Time: 08/19/16  5:05 PM  Result Value Ref Range   Specimen Description LEFT ANTECUBITAL    Special Requests      BOTTLES DRAWN AEROBIC AND ANAEROBIC Blood Culture adequate volume   Culture PENDING    Report Status PENDING   Blood Culture (routine x 2)     Status: None (Preliminary result)   Collection Time: 08/19/16  5:13 PM  Result Value Ref Range   Specimen Description BLOOD RIGHT HAND    Special  Requests      BOTTLES DRAWN AEROBIC AND ANAEROBIC Blood Culture adequate volume   Culture PENDING    Report Status PENDING   I-Stat CG4 Lactic Acid, ED  (not at  [facility])     Status: Abnormal   Collection Time: 08/19/16  5:22 PM  Result Value Ref Range   Lactic Acid, Venous 2.28 (HH) 0.5 - 1.9 mmol/L  I-Stat CG4 Lactic Acid, ED  (not at  [facility])     Status: None   Collection Time: 08/19/16  8:17 PM  Result Value Ref Range   Lactic Acid, Venous 0.92 0.5 - 1.9 mmol/L  Lactic acid, plasma     Status: None   Collection Time: 08/19/16  9:00 PM  Result Value Ref Range   Lactic Acid, Venous 1.1 0.5 - 1.9 mmol/L  Glucose, capillary     Status: Abnormal   Collection Time: 08/19/16  9:07 PM  Result Value Ref Range   Glucose-Capillary 240 (H) 65 - 99 mg/dL   Comment 1 Notify RN    Comment 2 Document in Chart   MRSA PCR Screening     Status: Abnormal   Collection Time: 08/19/16 10:40 PM  Result Value Ref Range   MRSA by PCR POSITIVE (A) NEGATIVE    Comment:        The GeneXpert MRSA Assay (FDA approved for NASAL specimens only), is one component of a comprehensive MRSA colonization surveillance program. It is not intended to diagnose MRSA infection nor to guide or monitor treatment for MRSA infections. RESULT CALLED TO, READ BACK BY AND VERIFIED WITH: ANDURN,A AT 0215 ON 6.8.2018 BY ISLEY,B   Basic metabolic panel     Status: Abnormal   Collection Time: 08/20/16  5:50 AM  Result Value Ref Range   Sodium 138 135 - 145 mmol/L   Potassium 3.9 3.5 - 5.1 mmol/L   Chloride 108 101 - 111 mmol/L   CO2 23 22 - 32 mmol/L   Glucose, Bld 213 (H) 65 - 99 mg/dL   BUN 27 (H) 6 - 20 mg/dL   Creatinine, Ser 1.34 (H) 0.44 - 1.00 mg/dL   Calcium 9.0 8.9 - 10.3 mg/dL   GFR calc non Af Amer 35 (L) >60 mL/min   GFR calc Af Amer 40 (L) >  60 mL/min    Comment: (NOTE) The eGFR has been calculated using the CKD EPI equation. This calculation has not been validated in all clinical situations. eGFR's  persistently <60 mL/min signify possible Chronic Kidney Disease.    Anion gap 7 5 - 15  CBC WITH DIFFERENTIAL     Status: Abnormal   Collection Time: 08/20/16  5:50 AM  Result Value Ref Range   WBC 9.2 4.0 - 10.5 K/uL   RBC 3.41 (L) 3.87 - 5.11 MIL/uL   Hemoglobin 10.4 (L) 12.0 - 15.0 g/dL   HCT 31.9 (L) 36.0 - 46.0 %   MCV 93.5 78.0 - 100.0 fL   MCH 30.5 26.0 - 34.0 pg   MCHC 32.6 30.0 - 36.0 g/dL   RDW 14.6 11.5 - 15.5 %   Platelets 249 150 - 400 K/uL   Neutrophils Relative % 69 %   Neutro Abs 6.3 1.7 - 7.7 K/uL   Lymphocytes Relative 21 %   Lymphs Abs 1.9 0.7 - 4.0 K/uL   Monocytes Relative 6 %   Monocytes Absolute 0.6 0.1 - 1.0 K/uL   Eosinophils Relative 4 %   Eosinophils Absolute 0.4 0.0 - 0.7 K/uL   Basophils Relative 0 %   Basophils Absolute 0.0 0.0 - 0.1 K/uL  Glucose, capillary     Status: Abnormal   Collection Time: 08/20/16  7:40 AM  Result Value Ref Range   Glucose-Capillary 194 (H) 65 - 99 mg/dL    ABGS No results for input(s): PHART, PO2ART, TCO2, HCO3 in the last 72 hours.  Invalid input(s): PCO2 CULTURES Recent Results (from the past 240 hour(s))  Blood Culture (routine x 2)     Status: None (Preliminary result)   Collection Time: 08/19/16  5:05 PM  Result Value Ref Range Status   Specimen Description LEFT ANTECUBITAL  Final   Special Requests   Final    BOTTLES DRAWN AEROBIC AND ANAEROBIC Blood Culture adequate volume   Culture PENDING  Incomplete   Report Status PENDING  Incomplete  Blood Culture (routine x 2)     Status: None (Preliminary result)   Collection Time: 08/19/16  5:13 PM  Result Value Ref Range Status   Specimen Description BLOOD RIGHT HAND  Final   Special Requests   Final    BOTTLES DRAWN AEROBIC AND ANAEROBIC Blood Culture adequate volume   Culture PENDING  Incomplete   Report Status PENDING  Incomplete  MRSA PCR Screening     Status: Abnormal   Collection Time: 08/19/16 10:40 PM  Result Value Ref Range Status   MRSA by PCR  POSITIVE (A) NEGATIVE Final    Comment:        The GeneXpert MRSA Assay (FDA approved for NASAL specimens only), is one component of a comprehensive MRSA colonization surveillance program. It is not intended to diagnose MRSA infection nor to guide or monitor treatment for MRSA infections. RESULT CALLED TO, READ BACK BY AND VERIFIED WITH: ANDURN,A AT 0215 ON 6.8.2018 BY ISLEY,B    Studies/Results: Dg Chest Portable 1 View  Result Date: 08/19/2016 CLINICAL DATA:  Shortness of breath EXAM: PORTABLE CHEST 1 VIEW COMPARISON:  None. FINDINGS: Mild to moderate cardiomegaly without overt pulmonary edema. Mild atelectasis or infiltrate at the right base. No large pleural effusion. Aortic atherosclerosis. No pneumothorax. IMPRESSION: 1. Mild atelectasis or infiltrate at the right base 2. Cardiomegaly without edema Electronically Signed   By: Donavan Foil M.D.   On: 08/19/2016 18:16    Medications:  Prior  to Admission:  Prescriptions Prior to Admission  Medication Sig Dispense Refill Last Dose  . acetaminophen (TYLENOL) 325 MG tablet Take 650 mg by mouth every 4 (four) hours as needed for mild pain or moderate pain.   unknown  . amLODipine (NORVASC) 10 MG tablet Place 10 mg into feeding tube daily.   08/19/2016 at Unknown time  . [EXPIRED] amoxicillin (AMOXIL) 500 MG capsule Take 1 capsule by mouth 3 (three) times daily.   08/19/2016 at Unknown time  . aspirin EC 81 MG tablet Take 81 mg by mouth daily.   08/19/2016 at Unknown time  . atorvastatin (LIPITOR) 20 MG tablet Place 20 mg into feeding tube daily.   08/18/2016 at Unknown time  . cholecalciferol (VITAMIN D) 1000 units tablet Place 1,000 Units into feeding tube daily.   08/19/2016 at Unknown time  . famotidine (PEPCID) 20 MG tablet Place 20 mg into feeding tube daily.   08/19/2016 at Unknown time  . fluticasone (FLONASE) 50 MCG/ACT nasal spray Place 2 sprays into both nostrils daily.   08/19/2016 at Unknown time  . hydrALAZINE (APRESOLINE) 25 MG tablet  Place 25 mg into feeding tube 3 (three) times daily.   08/19/2016 at Unknown time  . levothyroxine (SYNTHROID, LEVOTHROID) 50 MCG tablet Place 50 mcg into feeding tube daily before breakfast.   08/19/2016 at Unknown time  . magnesium oxide (MAG-OX) 400 MG tablet Place 400 mg into feeding tube daily.   08/19/2016 at Unknown time  . Multiple Vitamin (MULTIVITAMIN WITH MINERALS) TABS tablet Place 1 tablet into feeding tube daily.   08/19/2016 at Unknown time  . nystatin (MYCOSTATIN/NYSTOP) powder Apply topically 2 (two) times daily as needed (for redness).   Past Week at Unknown time  . Wheat Dextrin (BENEFIBER PO) 10 mLs by Gastric Tube route daily.   08/19/2016 at Unknown time   Scheduled: . amLODipine  10 mg Per Tube Daily  . aspirin EC  81 mg Oral Daily  . atorvastatin  20 mg Per Tube q1800  . Chlorhexidine Gluconate Cloth  6 each Topical Q0600  . cholecalciferol  1,000 Units Oral Daily  . famotidine  20 mg Per Tube Daily  . fluticasone  2 spray Each Nare Daily  . hydrALAZINE  25 mg Per Tube TID  . insulin aspart  0-5 Units Subcutaneous QHS  . insulin aspart  0-9 Units Subcutaneous TID WC  . levothyroxine  50 mcg Per Tube QAC breakfast  . liver oil-zinc oxide   Topical Once  . magnesium oxide  400 mg Per Tube Daily  . multivitamin with minerals  1 tablet Per Tube Daily  . mupirocin ointment  1 application Nasal BID  . nystatin   Topical TID  . sodium chloride flush  3 mL Intravenous Q12H   Continuous: . sodium chloride 250 mL (08/20/16 0223)  . piperacillin-tazobactam (ZOSYN)  IV 3.375 g (08/20/16 0223)  . vancomycin     CWC:BJSEGB chloride, acetaminophen, nystatin, sodium chloride flush  Assesment: She was admitted with healthcare associated pneumonia. She has a remote history of subdural hematoma. She has coronary disease at baseline, diabetes at baseline, recurrent urinary tract infections hypertension and dementia. Principal Problem:   HCAP (healthcare-associated pneumonia) Active  Problems:   Subdural hematoma ([place])   Hypertension   Diabetes mellitus without complication ([place])   Coronary artery disease    Plan: Continue current treatments    LOS: 1 day   Icker Swigert L 08/20/2016, 8:41 AM

## 2016-08-20 NOTE — Progress Notes (Signed)
Inpatient Diabetes Program Recommendations  AACE/ADA: New Consensus Statement on Inpatient Glycemic Control (2015)  Target Ranges:  Prepandial:   less than 140 mg/dL      Peak postprandial:   less than 180 mg/dL (1-2 hours)      Critically ill patients:  140 - 180 mg/dL   Results for AUTUMNE, KALLIO (MRN [id number]) as of 08/20/2016 10:09  Ref. Range 08/19/2016 21:07 08/20/2016 07:40  Glucose-Capillary Latest Ref Range: 65 - 99 mg/dL 240 (H) 194 (H)   Results for KANAE, IGNATOWSKI (MRN [id number]) as of 08/20/2016 10:09  Ref. Range 08/09/2016 16:02 08/19/2016 17:04 08/20/2016 05:50  Glucose Latest Ref Range: 65 - 99 mg/dL 431 (H) 302 (H) 213 (H)    Review of Glycemic Control  Diabetes history: DM2 Outpatient Diabetes medications: None Current orders for Inpatient glycemic control: Novolog 0-9 units TID with meals, Novolog 0-5 units QHS  Inpatient Diabetes Program Recommendations: Insulin-Basal: If glucose consistently elevated > 180 mg/dl, may want to consider ordering Levemir 5 units Q24H. HgbA1C: Please consider ordering an A1C to evaluate glycemic control over the past 2-3 months. Outpatient DM Medication Regimen:  Initial glucose 302 mg/dl when presented to hospital and noted lab glucose 431 mg/dl on 08/09/16 (blood work from ED visit). Patient likely needs to be prescribed outpatient DM medication.  Thanks, Barnie Alderman, RN, MSN, CDE Diabetes Coordinator Inpatient Diabetes Program [phone] (Team Pager from 8am to 5pm)

## 2016-08-20 NOTE — Care Management Note (Signed)
Case Management Note  Patient Details  Name: Jamela Cumbo MRN: [id number] Date of Birth: May 10, 1927  Subjective/Objective: Adm from [place] with HCAP. Chart reviewed. No CM consult. Patient is WC bound and has dementia. Anticipate return to [place].                    Action/Plan: Anticipate no CM needs. CSW aware of admission.   Expected Discharge Date:      08/20/2016            Expected Discharge Plan:  Assisted Living / Rest Home  In-House Referral:     Discharge planning Services  CM Consult  Post Acute Care Choice:    Choice offered to:     DME Arranged:    DME Agency:     HH Arranged:    [place] Agency:     Status of Service:  In process, will continue to follow  If discussed at Long Length of Stay Meetings, dates discussed:    Additional Comments:  Lamyah Creed, Chauncey Reading, RN 08/20/2016, 10:34 AM

## 2016-08-20 NOTE — Care Management Important Message (Signed)
Important Message  Patient Details  Name: Regina Bush MRN: [id number] Date of Birth: 1927/11/02   Medicare Important Message Given:  Yes    [name], Chauncey Reading, RN 08/20/2016, 2:26 PM

## 2016-08-21 ENCOUNTER — Encounter (HOSPITAL_COMMUNITY): Payer: Self-pay

## 2016-08-21 LAB — GLUCOSE, CAPILLARY
GLUCOSE-CAPILLARY: 174 mg/dL — AB (ref 65–99)
Glucose-Capillary: 192 mg/dL — ABNORMAL HIGH (ref 65–99)
Glucose-Capillary: 198 mg/dL — ABNORMAL HIGH (ref 65–99)
Glucose-Capillary: 153 mg/dL — ABNORMAL HIGH (ref 65–99)
Glucose-Capillary: 202 mg/dL — ABNORMAL HIGH (ref 65–99)

## 2016-08-21 LAB — URINE CULTURE: CULTURE: NO GROWTH

## 2016-08-21 MED ORDER — INSULIN DETEMIR 100 UNIT/ML ~~LOC~~ SOLN
5.0000 [IU] | Freq: Every day | SUBCUTANEOUS | Status: DC
  Administered 2016-08-21 – 2016-08-24 (×4): 5 [IU] via SUBCUTANEOUS
  Filled 2016-08-21 (×5): qty 0.05

## 2016-08-21 NOTE — Progress Notes (Signed)
Subjective: She says she feels a little better. She has no new complaints. She is alert and able to have a conversation with me. She's not coughing very much  Objective: Vital signs in last 24 hours: Temp:  [99.3 F (37.4 C)-99.4 F (37.4 C)] 99.3 F (37.4 C) (06/09 0500) Pulse Rate:  [69-80] 69 (06/09 0500) Resp:  [18] 18 (06/09 0500) BP: (141-157)/(65-76) 141/76 (06/09 0500) SpO2:  [93 %-95 %] 95 % (06/09 0500) Weight change:  Last BM Date: 08/18/16  Intake/Output from previous day: 06/08 0701 - 06/09 0700 In: 889.2 [P.O.:410; I.V.:229.2; IV Piggyback:250] Out: -   PHYSICAL EXAM General appearance: alert, cooperative and no distress Resp: rhonchi bilaterally Cardio: regular rate and rhythm, S1, S2 normal, no murmur, click, rub or gallop GI: soft, non-tender; bowel sounds normal; no masses,  no organomegaly Extremities: extremities normal, atraumatic, no cyanosis or edema Skin warm and dry  Lab Results:  Results for orders placed or performed during the hospital encounter of 08/19/16 (from the past 48 hour(s))  Urinalysis, Routine w reflex microscopic     Status: Abnormal   Collection Time: 08/19/16  4:50 PM  Result Value Ref Range   Color, Urine YELLOW YELLOW   APPearance CLEAR CLEAR   Specific Gravity, Urine 1.013 1.005 - 1.030   pH 6.0 5.0 - 8.0   Glucose, UA >=500 (A) NEGATIVE mg/dL   Hgb urine dipstick NEGATIVE NEGATIVE   Bilirubin Urine NEGATIVE NEGATIVE   Ketones, ur NEGATIVE NEGATIVE mg/dL   Protein, ur 30 (A) NEGATIVE mg/dL   Nitrite NEGATIVE NEGATIVE   Leukocytes, UA NEGATIVE NEGATIVE   RBC / HPF 0-5 0 - 5 RBC/hpf   WBC, UA 0-5 0 - 5 WBC/hpf   Bacteria, UA RARE (A) NONE SEEN   Squamous Epithelial / LPF 0-5 (A) NONE SEEN  Urine culture     Status: None   Collection Time: 08/19/16  4:50 PM  Result Value Ref Range   Specimen Description URINE, CLEAN CATCH    Special Requests NONE    Culture      NO GROWTH Performed at [facility], [address], [place], [place] [address]    Report Status 08/21/2016 FINAL   Comprehensive metabolic panel     Status: Abnormal   Collection Time: 08/19/16  5:04 PM  Result Value Ref Range   Sodium 136 135 - 145 mmol/L   Potassium 4.5 3.5 - 5.1 mmol/L   Chloride 102 101 - 111 mmol/L   CO2 25 22 - 32 mmol/L   Glucose, Bld 302 (H) 65 - 99 mg/dL   BUN 34 (H) 6 - 20 mg/dL   Creatinine, Ser 1.45 (H) 0.44 - 1.00 mg/dL   Calcium 9.8 8.9 - 10.3 mg/dL   Total Protein 7.0 6.5 - 8.1 g/dL   Albumin 3.6 3.5 - 5.0 g/dL   AST 13 (L) 15 - 41 U/L   ALT 14 14 - 54 U/L   Alkaline Phosphatase 95 38 - 126 U/L   Total Bilirubin 0.4 0.3 - 1.2 mg/dL   GFR calc non Af Amer 31 (L) >60 mL/min   GFR calc Af Amer 36 (L) >60 mL/min    Comment: (NOTE) The eGFR has been calculated using the CKD EPI equation. This calculation has not been validated in all clinical situations. eGFR's persistently <60 mL/min signify possible Chronic Kidney Disease.    Anion gap 9 5 - 15  CBC WITH DIFFERENTIAL     Status: Abnormal  Collection Time: 08/19/16  5:04 PM  Result Value Ref Range   WBC 10.7 (H) 4.0 - 10.5 K/uL   RBC 3.53 (L) 3.87 - 5.11 MIL/uL   Hemoglobin 10.7 (L) 12.0 - 15.0 g/dL   HCT 33.4 (L) 36.0 - 46.0 %   MCV 94.6 78.0 - 100.0 fL   MCH 30.3 26.0 - 34.0 pg   MCHC 32.0 30.0 - 36.0 g/dL   RDW 14.2 11.5 - 15.5 %   Platelets 265 150 - 400 K/uL   Neutrophils Relative % 69 %   Neutro Abs 7.3 1.7 - 7.7 K/uL   Lymphocytes Relative 20 %   Lymphs Abs 2.1 0.7 - 4.0 K/uL   Monocytes Relative 8 %   Monocytes Absolute 0.9 0.1 - 1.0 K/uL   Eosinophils Relative 3 %   Eosinophils Absolute 0.4 0.0 - 0.7 K/uL   Basophils Relative 0 %   Basophils Absolute 0.0 0.0 - 0.1 K/uL  Blood Culture (routine x 2)     Status: None (Preliminary result)   Collection Time: 08/19/16  5:05 PM  Result Value Ref Range   Specimen Description LEFT ANTECUBITAL    Special Requests      BOTTLES DRAWN AEROBIC AND ANAEROBIC Blood Culture adequate  volume   Culture NO GROWTH < 24 HOURS    Report Status PENDING   Blood Culture (routine x 2)     Status: None (Preliminary result)   Collection Time: 08/19/16  5:13 PM  Result Value Ref Range   Specimen Description BLOOD RIGHT HAND    Special Requests      BOTTLES DRAWN AEROBIC AND ANAEROBIC Blood Culture adequate volume   Culture NO GROWTH < 24 HOURS    Report Status PENDING   I-Stat CG4 Lactic Acid, ED  (not at  [facility])     Status: Abnormal   Collection Time: 08/19/16  5:22 PM  Result Value Ref Range   Lactic Acid, Venous 2.28 (HH) 0.5 - 1.9 mmol/L  I-Stat CG4 Lactic Acid, ED  (not at  [facility])     Status: None   Collection Time: 08/19/16  8:17 PM  Result Value Ref Range   Lactic Acid, Venous 0.92 0.5 - 1.9 mmol/L  Lactic acid, plasma     Status: None   Collection Time: 08/19/16  9:00 PM  Result Value Ref Range   Lactic Acid, Venous 1.1 0.5 - 1.9 mmol/L  Strep pneumoniae urinary antigen     Status: None   Collection Time: 08/19/16  9:00 PM  Result Value Ref Range   Strep Pneumo Urinary Antigen NEGATIVE NEGATIVE    Comment:        Infection due to S. pneumoniae cannot be absolutely ruled out since the antigen present may be below the detection limit of the test. Performed at [facility], [address], [place], [place] [address]   Glucose, capillary     Status: Abnormal   Collection Time: 08/19/16  9:07 PM  Result Value Ref Range   Glucose-Capillary 240 (H) 65 - 99 mg/dL   Comment 1 Notify RN    Comment 2 Document in Chart   MRSA PCR Screening     Status: Abnormal   Collection Time: 08/19/16 10:40 PM  Result Value Ref Range   MRSA by PCR POSITIVE (A) NEGATIVE    Comment:        The GeneXpert MRSA Assay (FDA approved for NASAL specimens only), is one component of a comprehensive MRSA colonization surveillance program.  It is not intended to diagnose MRSA infection nor to guide or monitor treatment for MRSA infections. RESULT CALLED TO, READ BACK BY AND  VERIFIED WITH: ANDURN,A AT 0215 ON 6.8.2018 BY ISLEY,B   Basic metabolic panel     Status: Abnormal   Collection Time: 08/20/16  5:50 AM  Result Value Ref Range   Sodium 138 135 - 145 mmol/L   Potassium 3.9 3.5 - 5.1 mmol/L   Chloride 108 101 - 111 mmol/L   CO2 23 22 - 32 mmol/L   Glucose, Bld 213 (H) 65 - 99 mg/dL   BUN 27 (H) 6 - 20 mg/dL   Creatinine, Ser 1.34 (H) 0.44 - 1.00 mg/dL   Calcium 9.0 8.9 - 10.3 mg/dL   GFR calc non Af Amer 35 (L) >60 mL/min   GFR calc Af Amer 40 (L) >60 mL/min    Comment: (NOTE) The eGFR has been calculated using the CKD EPI equation. This calculation has not been validated in all clinical situations. eGFR's persistently <60 mL/min signify possible Chronic Kidney Disease.    Anion gap 7 5 - 15  CBC WITH DIFFERENTIAL     Status: Abnormal   Collection Time: 08/20/16  5:50 AM  Result Value Ref Range   WBC 9.2 4.0 - 10.5 K/uL   RBC 3.41 (L) 3.87 - 5.11 MIL/uL   Hemoglobin 10.4 (L) 12.0 - 15.0 g/dL   HCT 31.9 (L) 36.0 - 46.0 %   MCV 93.5 78.0 - 100.0 fL   MCH 30.5 26.0 - 34.0 pg   MCHC 32.6 30.0 - 36.0 g/dL   RDW 14.6 11.5 - 15.5 %   Platelets 249 150 - 400 K/uL   Neutrophils Relative % 69 %   Neutro Abs 6.3 1.7 - 7.7 K/uL   Lymphocytes Relative 21 %   Lymphs Abs 1.9 0.7 - 4.0 K/uL   Monocytes Relative 6 %   Monocytes Absolute 0.6 0.1 - 1.0 K/uL   Eosinophils Relative 4 %   Eosinophils Absolute 0.4 0.0 - 0.7 K/uL   Basophils Relative 0 %   Basophils Absolute 0.0 0.0 - 0.1 K/uL  Glucose, capillary     Status: Abnormal   Collection Time: 08/20/16  7:40 AM  Result Value Ref Range   Glucose-Capillary 194 (H) 65 - 99 mg/dL  Glucose, capillary     Status: Abnormal   Collection Time: 08/20/16 11:15 AM  Result Value Ref Range   Glucose-Capillary 176 (H) 65 - 99 mg/dL  Glucose, capillary     Status: Abnormal   Collection Time: 08/20/16  4:58 PM  Result Value Ref Range   Glucose-Capillary 108 (H) 65 - 99 mg/dL   Comment 1 Notify RN    Comment  2 Document in Chart   Glucose, capillary     Status: Abnormal   Collection Time: 08/20/16 10:17 PM  Result Value Ref Range   Glucose-Capillary 158 (H) 65 - 99 mg/dL  Glucose, capillary     Status: Abnormal   Collection Time: 08/21/16  7:41 AM  Result Value Ref Range   Glucose-Capillary 174 (H) 65 - 99 mg/dL    ABGS No results for input(s): PHART, PO2ART, TCO2, HCO3 in the last 72 hours.  Invalid input(s): PCO2 CULTURES Recent Results (from the past 240 hour(s))  Urine culture     Status: None   Collection Time: 08/19/16  4:50 PM  Result Value Ref Range Status   Specimen Description URINE, CLEAN CATCH  Final   Special Requests NONE  Final   Culture   Final    NO GROWTH Performed at [facility], [place] [address], [place],  [address]    Report Status 08/21/2016 FINAL  Final  Blood Culture (routine x 2)     Status: None (Preliminary result)   Collection Time: 08/19/16  5:05 PM  Result Value Ref Range Status   Specimen Description LEFT ANTECUBITAL  Final   Special Requests   Final    BOTTLES DRAWN AEROBIC AND ANAEROBIC Blood Culture adequate volume   Culture NO GROWTH < 24 HOURS  Final   Report Status PENDING  Incomplete  Blood Culture (routine x 2)     Status: None (Preliminary result)   Collection Time: 08/19/16  5:13 PM  Result Value Ref Range Status   Specimen Description BLOOD RIGHT HAND  Final   Special Requests   Final    BOTTLES DRAWN AEROBIC AND ANAEROBIC Blood Culture adequate volume   Culture NO GROWTH < 24 HOURS  Final   Report Status PENDING  Incomplete  MRSA PCR Screening     Status: Abnormal   Collection Time: 08/19/16 10:40 PM  Result Value Ref Range Status   MRSA by PCR POSITIVE (A) NEGATIVE Final    Comment:        The GeneXpert MRSA Assay (FDA approved for NASAL specimens only), is one component of a comprehensive MRSA colonization surveillance program. It is not intended to diagnose MRSA infection nor to guide or monitor treatment  for MRSA infections. RESULT CALLED TO, READ BACK BY AND VERIFIED WITH: ANDURN,A AT 0215 ON 6.8.2018 BY ISLEY,B    Studies/Results: Dg Chest Portable 1 View  Result Date: 08/19/2016 CLINICAL DATA:  Shortness of breath EXAM: PORTABLE CHEST 1 VIEW COMPARISON:  None. FINDINGS: Mild to moderate cardiomegaly without overt pulmonary edema. Mild atelectasis or infiltrate at the right base. No large pleural effusion. Aortic atherosclerosis. No pneumothorax. IMPRESSION: 1. Mild atelectasis or infiltrate at the right base 2. Cardiomegaly without edema Electronically Signed   By: Donavan Foil M.D.   On: 08/19/2016 18:16    Medications:  Prior to Admission:  Prescriptions Prior to Admission  Medication Sig Dispense Refill Last Dose  . acetaminophen (TYLENOL) 325 MG tablet Take 650 mg by mouth every 4 (four) hours as needed for mild pain or moderate pain.   unknown  . amLODipine (NORVASC) 10 MG tablet Place 10 mg into feeding tube daily.   08/19/2016 at Unknown time  . [EXPIRED] amoxicillin (AMOXIL) 500 MG capsule Take 1 capsule by mouth 3 (three) times daily.   08/19/2016 at Unknown time  . aspirin EC 81 MG tablet Take 81 mg by mouth daily.   08/19/2016 at Unknown time  . atorvastatin (LIPITOR) 20 MG tablet Place 20 mg into feeding tube daily.   08/18/2016 at Unknown time  . cholecalciferol (VITAMIN D) 1000 units tablet Place 1,000 Units into feeding tube daily.   08/19/2016 at Unknown time  . famotidine (PEPCID) 20 MG tablet Place 20 mg into feeding tube daily.   08/19/2016 at Unknown time  . fluticasone (FLONASE) 50 MCG/ACT nasal spray Place 2 sprays into both nostrils daily.   08/19/2016 at Unknown time  . hydrALAZINE (APRESOLINE) 25 MG tablet Place 25 mg into feeding tube 3 (three) times daily.   08/19/2016 at Unknown time  . levothyroxine (SYNTHROID, LEVOTHROID) 50 MCG tablet Place 50 mcg into feeding tube daily before breakfast.   08/19/2016 at Unknown time  . magnesium oxide (MAG-OX) 400 MG  tablet Place 400 mg  into feeding tube daily.   08/19/2016 at Unknown time  . Multiple Vitamin (MULTIVITAMIN WITH MINERALS) TABS tablet Place 1 tablet into feeding tube daily.   08/19/2016 at Unknown time  . nystatin (MYCOSTATIN/NYSTOP) powder Apply topically 2 (two) times daily as needed (for redness).   Past Week at Unknown time  . Wheat Dextrin (BENEFIBER PO) 10 mLs by Gastric Tube route daily.   08/19/2016 at Unknown time   Scheduled: . amLODipine  10 mg Per Tube Daily  . aspirin EC  81 mg Oral Daily  . atorvastatin  20 mg Per Tube q1800  . Chlorhexidine Gluconate Cloth  6 each Topical Q0600  . cholecalciferol  1,000 Units Oral Daily  . famotidine  20 mg Per Tube Daily  . fluticasone  2 spray Each Nare Daily  . hydrALAZINE  25 mg Per Tube TID  . insulin aspart  0-5 Units Subcutaneous QHS  . insulin aspart  0-9 Units Subcutaneous TID WC  . insulin detemir  5 Units Subcutaneous QHS  . levothyroxine  50 mcg Per Tube QAC breakfast  . liver oil-zinc oxide   Topical Once  . magnesium oxide  400 mg Per Tube Daily  . multivitamin with minerals  1 tablet Per Tube Daily  . mupirocin ointment  1 application Nasal BID  . nystatin   Topical TID  . sodium chloride flush  3 mL Intravenous Q12H   Continuous: . sodium chloride 250 mL (08/20/16 0223)  . piperacillin-tazobactam (ZOSYN)  IV Stopped (08/21/16 0710)  . vancomycin Stopped (08/20/16 1916)   XYO:FVWAQL chloride, acetaminophen, nystatin, sodium chloride flush  Assesment:She is admitted with healthcare associated pneumonia. She had subdural hematoma some time ago. She seems to be improving. Her blood sugar has not been controlled so I adjusted her medications. Principal Problem:   HCAP (healthcare-associated pneumonia) Active Problems:   Subdural hematoma ([place])   Hypertension   Diabetes mellitus without complication ([place])   Coronary artery disease    Plan: Continue current treatments including IV antibiotics. Get her up in a chair today    LOS: 2 days    Marcellas Marchant L 08/21/2016, 10:01 AM

## 2016-08-21 NOTE — Progress Notes (Signed)
When cleaning up the patient, 2 small skin tears found at the sacral area. The pt has severe moisture associated skin damage and the skin tears appear to be the result of this. The top skin tear measures 1 in X 1 mm with no depth. The second skin tear measures 0.5 inch X 2 mm. The area was cleansed and a foam sacral dressing was placed.

## 2016-08-22 LAB — BASIC METABOLIC PANEL
Anion gap: 9 (ref 5–15)
BUN: 27 mg/dL — AB (ref 6–20)
CALCIUM: 9 mg/dL (ref 8.9–10.3)
CHLORIDE: 108 mmol/L (ref 101–111)
CO2: 22 mmol/L (ref 22–32)
CREATININE: 1.41 mg/dL — AB (ref 0.44–1.00)
GFR calc Af Amer: 38 mL/min — ABNORMAL LOW (ref >60)
GFR calc non Af Amer: 32 mL/min — ABNORMAL LOW (ref >60)
Glucose, Bld: 117 mg/dL — ABNORMAL HIGH (ref 65–99)
Potassium: 3.5 mmol/L (ref 3.5–5.1)
Sodium: 139 mmol/L (ref 135–145)

## 2016-08-22 LAB — HEMOGLOBIN A1C
HEMOGLOBIN A1C: 9.9 % — AB (ref 4.8–5.6)
Mean Plasma Glucose: 237 mg/dL

## 2016-08-22 LAB — GLUCOSE, CAPILLARY
Glucose-Capillary: 115 mg/dL — ABNORMAL HIGH (ref 65–99)
Glucose-Capillary: 209 mg/dL — ABNORMAL HIGH (ref 65–99)
Glucose-Capillary: 147 mg/dL — ABNORMAL HIGH (ref 65–99)
Glucose-Capillary: 165 mg/dL — ABNORMAL HIGH (ref 65–99)

## 2016-08-22 NOTE — Progress Notes (Signed)
Peripheral IV Access was lost due to being pulled out by Patient.  Multiple attempts by multiple nursing staff were made in order to replace the IV.  All unable.  Order received by Dr. Legrand Rams for PICC line.  Call made to Vascular services.  Awaiting ETA

## 2016-08-22 NOTE — Progress Notes (Signed)
Subjective: Patient feels better. Her breathing is improving. Her appetite is slightly better. No fever or chills.  Objective: Vital signs in last 24 hours: Temp:  [98.4 F (36.9 C)-99.7 F (37.6 C)] 98.4 F (36.9 C) (06/10 0500) Pulse Rate:  [59-68] 59 (06/10 0500) Resp:  [18] 18 (06/10 0500) BP: (128-149)/(48-52) 149/52 (06/10 0500) SpO2:  [92 %-98 %] 98 % (06/10 0500) Weight change:  Last BM Date: 08/21/16  Intake/Output from previous day: 06/09 0701 - 06/10 0700 In: 460 [P.O.:360; I.V.:50; IV Piggyback:50] Out: -   PHYSICAL EXAM General appearance: cooperative and no distress Resp: diminished breath sounds bilaterally and rhonchi bilaterally Cardio: S1, S2 normal GI: soft, non-tender; bowel sounds normal; no masses,  no organomegaly Extremities: extremities normal, atraumatic, no cyanosis or edema  Lab Results:  Results for orders placed or performed during the hospital encounter of 08/19/16 (from the past 48 hour(s))  Glucose, capillary     Status: Abnormal   Collection Time: 08/20/16 11:15 AM  Result Value Ref Range   Glucose-Capillary 176 (H) 65 - 99 mg/dL  Glucose, capillary     Status: Abnormal   Collection Time: 08/20/16  4:58 PM  Result Value Ref Range   Glucose-Capillary 108 (H) 65 - 99 mg/dL   Comment 1 Notify RN    Comment 2 Document in Chart   Glucose, capillary     Status: Abnormal   Collection Time: 08/20/16 10:17 PM  Result Value Ref Range   Glucose-Capillary 158 (H) 65 - 99 mg/dL  Glucose, capillary     Status: Abnormal   Collection Time: 08/21/16  7:41 AM  Result Value Ref Range   Glucose-Capillary 174 (H) 65 - 99 mg/dL  Glucose, capillary     Status: Abnormal   Collection Time: 08/21/16 11:19 AM  Result Value Ref Range   Glucose-Capillary 192 (H) 65 - 99 mg/dL  Glucose, capillary     Status: Abnormal   Collection Time: 08/21/16  1:14 PM  Result Value Ref Range   Glucose-Capillary 198 (H) 65 - 99 mg/dL  Glucose, capillary     Status:  Abnormal   Collection Time: 08/21/16  4:21 PM  Result Value Ref Range   Glucose-Capillary 153 (H) 65 - 99 mg/dL  Glucose, capillary     Status: Abnormal   Collection Time: 08/21/16  8:34 PM  Result Value Ref Range   Glucose-Capillary 202 (H) 65 - 99 mg/dL  Basic metabolic panel     Status: Abnormal   Collection Time: 08/22/16  6:35 AM  Result Value Ref Range   Sodium 139 135 - 145 mmol/L   Potassium 3.5 3.5 - 5.1 mmol/L   Chloride 108 101 - 111 mmol/L   CO2 22 22 - 32 mmol/L   Glucose, Bld 117 (H) 65 - 99 mg/dL   BUN 27 (H) 6 - 20 mg/dL   Creatinine, Ser 1.17 (H) 0.44 - 1.00 mg/dL   Calcium 9.0 8.9 - 93.0 mg/dL   GFR calc non Af Amer 32 (L) >60 mL/min   GFR calc Af Amer 38 (L) >60 mL/min    Comment: (NOTE) The eGFR has been calculated using the CKD EPI equation. This calculation has not been validated in all clinical situations. eGFR's persistently <60 mL/min signify possible Chronic Kidney Disease.    Anion gap 9 5 - 15  Glucose, capillary     Status: Abnormal   Collection Time: 08/22/16  7:51 AM  Result Value Ref Range   Glucose-Capillary 115 (H) 65 -  99 mg/dL    ABGS No results for input(s): PHART, PO2ART, TCO2, HCO3 in the last 72 hours.  Invalid input(s): PCO2 CULTURES Recent Results (from the past 240 hour(s))  Urine culture     Status: None   Collection Time: 08/19/16  4:50 PM  Result Value Ref Range Status   Specimen Description URINE, CLEAN CATCH  Final   Special Requests NONE  Final   Culture   Final    NO GROWTH Performed at [facility], [place] [address], [place], [place] [address]    Report Status 08/21/2016 FINAL  Final  Blood Culture (routine x 2)     Status: None (Preliminary result)   Collection Time: 08/19/16  5:05 PM  Result Value Ref Range Status   Specimen Description LEFT ANTECUBITAL  Final   Special Requests   Final    BOTTLES DRAWN AEROBIC AND ANAEROBIC Blood Culture adequate volume   Culture NO GROWTH 3 DAYS  Final   Report Status  PENDING  Incomplete  Blood Culture (routine x 2)     Status: None (Preliminary result)   Collection Time: 08/19/16  5:13 PM  Result Value Ref Range Status   Specimen Description BLOOD RIGHT HAND  Final   Special Requests   Final    BOTTLES DRAWN AEROBIC AND ANAEROBIC Blood Culture adequate volume   Culture NO GROWTH 3 DAYS  Final   Report Status PENDING  Incomplete  MRSA PCR Screening     Status: Abnormal   Collection Time: 08/19/16 10:40 PM  Result Value Ref Range Status   MRSA by PCR POSITIVE (A) NEGATIVE Final    Comment:        The GeneXpert MRSA Assay (FDA approved for NASAL specimens only), is one component of a comprehensive MRSA colonization surveillance program. It is not intended to diagnose MRSA infection nor to guide or monitor treatment for MRSA infections. RESULT CALLED TO, READ BACK BY AND VERIFIED WITH: ANDURN,A AT 0215 ON 6.8.2018 BY ISLEY,B    Studies/Results: No results found.  Medications: I have reviewed the patient's current medications.  Assesment:  Principal Problem:   HCAP (healthcare-associated pneumonia) Active Problems:   Subdural hematoma ([place])   Hypertension   Diabetes mellitus without complication ([place])   Coronary artery disease    Plan:  Medications reviewed Continue Iv antibiotics continue current treatment and supportive care    LOS: 3 days   Danae Oland 08/22/2016, 10:34 AM

## 2016-08-22 NOTE — Evaluation (Signed)
Clinical/Bedside Swallow Evaluation Patient Details  Name: Regina Bush MRN: [id number] Date of Birth: 1927-06-14  Today's Date: 08/22/2016 Time: SLP Start Time (ACUTE ONLY): 1030 SLP Stop Time (ACUTE ONLY): 1054 SLP Time Calculation (min) (ACUTE ONLY): 24 min  Past Medical History:  Past Medical History:  Diagnosis Date  . Anemia   . Arthritis   . Cerebral infarction ([place])   . Coronary artery disease   . Dementia   . Diabetes mellitus without complication (HCC)    Type 2  . Dysphagia due to recent cerebral infarction   . Failure to thrive (0-17)   . Hypercalcemia   . Hypertension   . Pneumonitis   . Protein calorie malnutrition ([place])   . Subdural hematoma ([place])   . Thyroid disease    Past Surgical History: History reviewed. No pertinent surgical history. HPI:  Regina Bush a 84 y.o.femalewith medical history significant of dementia, wheelchair bound, recent uti on amoxicillin (9days) CAD, DM, HTN, FTT comes in with 2 days of fever. She complains of sob and cough. dtr is present and only knows she has been running fever. Found to have possible pna. Pt known to this SLP from [facility] completed February 2018 while she was at [facility] for rehab. She was discharged from [facility] to [facility] on a regular diet with thin liquids. Pt tells SLP that she takes her medications whole in applesauce. Current chest x-ray shows: Mild atelectasis or infiltrate at the right base.   Assessment / Plan / Recommendation Clinical Impression  Pt without overt signs of symptoms of aspiration at bedside with consistencies and textures presented. Pt does have a history of dysphagia and had PEG, however this was removed and Pt tolerating a regular diet with thin liquids at [place]. Pt denies difficulty swallowing. Recommend continuing diet as ordered with aspiration precautions. Ideally, Pt would be upright in chair for all meals. SLP will follow x1 for diet tolerance. SLP Visit Diagnosis: Dysphagia, oropharyngeal  phase (R13.12)    Aspiration Risk  Mild aspiration risk    Diet Recommendation Regular;Thin liquid   Liquid Administration via: Cup;Straw Medication Administration: Whole meds with puree Supervision: Patient able to self feed;Intermittent supervision to cue for compensatory strategies Postural Changes: Seated upright at 90 degrees;Remain upright for at least 30 minutes after po intake    Other  Recommendations Oral Care Recommendations: Oral care BID;Staff/trained caregiver to provide oral care Other Recommendations: Clarify dietary restrictions   Follow up Recommendations 24 hour supervision/assistance      Frequency and Duration min 1 x/week  1 week       Prognosis Prognosis for Safe Diet Advancement: Good Barriers to Reach Goals: Cognitive deficits      Swallow Study   General Date of Onset: 08/19/16 HPI: Regina Bush a 83 y.o.femalewith medical history significant of dementia, wheelchair bound, recent uti on amoxicillin (9days) CAD, DM, HTN, FTT comes in with 2 days of fever. She complains of sob and cough. dtr is present and only knows she has been running fever. Found to have possible pna. Pt known to this SLP from [facility] completed February 2018 while she was at [facility] for rehab. She was discharged from [facility] to [facility] on a regular diet with thin liquids. Pt tells SLP that she takes her medications whole in applesauce. Current chest x-ray shows: Mild atelectasis or infiltrate at the right base. Type of Study: Bedside Swallow Evaluation Previous Swallow Assessment: MBSS 04/2016: D2/NTL Diet Prior to this Study: Regular;Thin liquids Temperature Spikes Noted: No Respiratory Status:  Room air History of Recent Intubation: No Behavior/Cognition: Alert;Cooperative;Pleasant mood Oral Cavity Assessment: Within Functional Limits Oral Care Completed by SLP: No Oral Cavity - Dentition: Adequate natural dentition Vision: Functional for self-feeding Self-Feeding Abilities: Able to  feed self Patient Positioning: Upright in bed (position would be better up in chair) Baseline Vocal Quality: Normal Volitional Cough: Strong Volitional Swallow: Able to elicit    Oral/Motor/Sensory Function Overall Oral Motor/Sensory Function: Within functional limits   Ice Chips Ice chips: Within functional limits Presentation: Spoon   Thin Liquid Thin Liquid: Within functional limits Presentation: Cup;Self Fed;Straw    Nectar Thick Nectar Thick Liquid: Not tested   Honey Thick Honey Thick Liquid: Not tested   Puree Puree: Within functional limits Presentation: Spoon   Solid   Thank you,  Genene Churn, CCC-SLP [phone]    Solid: Within functional limits Presentation: [place] 08/22/2016,11:00 AM

## 2016-08-23 LAB — BASIC METABOLIC PANEL
Anion gap: 8 (ref 5–15)
BUN: 23 mg/dL — ABNORMAL HIGH (ref 6–20)
CHLORIDE: 106 mmol/L (ref 101–111)
CO2: 24 mmol/L (ref 22–32)
CREATININE: 1.17 mg/dL — AB (ref 0.44–1.00)
Calcium: 9 mg/dL (ref 8.9–10.3)
GFR calc non Af Amer: 41 mL/min — ABNORMAL LOW (ref >60)
GFR, EST AFRICAN AMERICAN: 47 mL/min — AB (ref >60)
GLUCOSE: 125 mg/dL — AB (ref 65–99)
Potassium: 3.6 mmol/L (ref 3.5–5.1)
Sodium: 138 mmol/L (ref 135–145)

## 2016-08-23 LAB — GLUCOSE, CAPILLARY
GLUCOSE-CAPILLARY: 136 mg/dL — AB (ref 65–99)
GLUCOSE-CAPILLARY: 162 mg/dL — AB (ref 65–99)
Glucose-Capillary: 120 mg/dL — ABNORMAL HIGH (ref 65–99)
Glucose-Capillary: 154 mg/dL — ABNORMAL HIGH (ref 65–99)

## 2016-08-23 NOTE — Clinical Social Work Note (Signed)
Clinical Social Work Assessment  Patient Details  Name: Regina Bush MRN: [id number] Date of Birth: 28-Sep-1927  Date of referral:  08/23/16               Reason for consult:  Discharge Planning                Permission sought to share information with:    Permission granted to share information::     Name::        Agency::     Relationship::     Contact Information:  Jacquelyne Balint, dtr., listed on chart.   Housing/Transportation Living arrangements for the past 2 months:  [place] of Information:  Facility, Adult Children Patient Interpreter Needed:  None Criminal Activity/Legal Involvement Pertinent to Current Situation/Hospitalization:  No - Comment as needed Significant Relationships:  Adult Children Lives with:  Facility Resident Do you feel safe going back to the place where you live?  Yes Need for family participation in patient care:  Yes (Comment)  Care giving concerns:  None identified. Facility resident.    Social Worker assessment / plan:  Mrs. Karie Kirks stated that patient has been at [facility] for about three months. Patient uses a wheelchair but staff does get her up and walk her with a walker with a gait belt and a lot of assistance.  Patient feeds herself, is incontinent and receives assistance from staff bathing/dressing. Family desires that patient to return at discharge. Olivia Mackie at [facility] confirmed statements and stated that patient can return at discharge.    Employment status:  Retired Forensic scientist:  Medicare PT Recommendations:  Not assessed at this time Information / Referral to community resources:     Patient/Family's Response to care: Family is agreeable for patient to return at discharge.   Patient/Family's Understanding of and Emotional Response to Diagnosis, Current Treatment, and Prognosis:  Patient's family understands their diagnosis, treatment and prognosis.    Emotional Assessment Appearance:   Appears stated age Attitude/Demeanor/Rapport:  Unable to Assess Affect (typically observed):  Unable to Assess Orientation:  Oriented to Self, Oriented to Place Alcohol / Substance use:  Not Applicable Psych involvement (Current and /or in the community):  No (Comment)  Discharge Needs  Concerns to be addressed:  Discharge Planning Concerns (Return to [place]) Readmission within the last 30 days:  No Current discharge risk:  None Barriers to Discharge:  No Barriers Identified   Ihor Gully, LCSW 08/23/2016, 10:24 AM

## 2016-08-23 NOTE — Progress Notes (Signed)
Subjective: Patient feels better. Her breathing is improving. Her renal function is also improving. Her po intake is adequate. Objective: Vital signs in last 24 hours: Temp:  [98.2 F (36.8 C)-99.5 F (37.5 C)] 98.2 F (36.8 C) (06/11 0500) Pulse Rate:  [55-71] 55 (06/11 0500) Resp:  [16-18] 18 (06/11 0500) BP: (126-163)/(54-62) 163/54 (06/11 0500) SpO2:  [94 %-99 %] 99 % (06/11 0500) Weight change:  Last BM Date: 08/21/16  Intake/Output from previous day: 06/10 0701 - 06/11 0700 In: 1100 [P.O.:600; IV Piggyback:500] Out: -   PHYSICAL EXAM General appearance: cooperative and no distress Resp: diminished breath sounds bilaterally and rhonchi bilaterally Cardio: S1, S2 normal GI: soft, non-tender; bowel sounds normal; no masses,  no organomegaly Extremities: extremities normal, atraumatic, no cyanosis or edema  Lab Results:  Results for orders placed or performed during the hospital encounter of 08/19/16 (from the past 48 hour(s))  Glucose, capillary     Status: Abnormal   Collection Time: 08/21/16 11:19 AM  Result Value Ref Range   Glucose-Capillary 192 (H) 65 - 99 mg/dL  Glucose, capillary     Status: Abnormal   Collection Time: 08/21/16  1:14 PM  Result Value Ref Range   Glucose-Capillary 198 (H) 65 - 99 mg/dL  Glucose, capillary     Status: Abnormal   Collection Time: 08/21/16  4:21 PM  Result Value Ref Range   Glucose-Capillary 153 (H) 65 - 99 mg/dL  Glucose, capillary     Status: Abnormal   Collection Time: 08/21/16  8:34 PM  Result Value Ref Range   Glucose-Capillary 202 (H) 65 - 99 mg/dL  Basic metabolic panel     Status: Abnormal   Collection Time: 08/22/16  6:35 AM  Result Value Ref Range   Sodium 139 135 - 145 mmol/L   Potassium 3.5 3.5 - 5.1 mmol/L   Chloride 108 101 - 111 mmol/L   CO2 22 22 - 32 mmol/L   Glucose, Bld 117 (H) 65 - 99 mg/dL   BUN 27 (H) 6 - 20 mg/dL   Creatinine, Ser 1.41 (H) 0.44 - 1.00 mg/dL   Calcium 9.0 8.9 - 10.3 mg/dL   GFR calc  non Af Amer 32 (L) >60 mL/min   GFR calc Af Amer 38 (L) >60 mL/min    Comment: (NOTE) The eGFR has been calculated using the CKD EPI equation. This calculation has not been validated in all clinical situations. eGFR's persistently <60 mL/min signify possible Chronic Kidney Disease.    Anion gap 9 5 - 15  Glucose, capillary     Status: Abnormal   Collection Time: 08/22/16  7:51 AM  Result Value Ref Range   Glucose-Capillary 115 (H) 65 - 99 mg/dL  Glucose, capillary     Status: Abnormal   Collection Time: 08/22/16 11:17 AM  Result Value Ref Range   Glucose-Capillary 209 (H) 65 - 99 mg/dL  Glucose, capillary     Status: Abnormal   Collection Time: 08/22/16  4:26 PM  Result Value Ref Range   Glucose-Capillary 147 (H) 65 - 99 mg/dL  Glucose, capillary     Status: Abnormal   Collection Time: 08/22/16  8:56 PM  Result Value Ref Range   Glucose-Capillary 165 (H) 65 - 99 mg/dL  Basic metabolic panel     Status: Abnormal   Collection Time: 08/23/16  5:33 AM  Result Value Ref Range   Sodium 138 135 - 145 mmol/L   Potassium 3.6 3.5 - 5.1 mmol/L   Chloride 106  101 - 111 mmol/L   CO2 24 22 - 32 mmol/L   Glucose, Bld 125 (H) 65 - 99 mg/dL   BUN 23 (H) 6 - 20 mg/dL   Creatinine, Ser 1.17 (H) 0.44 - 1.00 mg/dL   Calcium 9.0 8.9 - 10.3 mg/dL   GFR calc non Af Amer 41 (L) >60 mL/min   GFR calc Af Amer 47 (L) >60 mL/min    Comment: (NOTE) The eGFR has been calculated using the CKD EPI equation. This calculation has not been validated in all clinical situations. eGFR's persistently <60 mL/min signify possible Chronic Kidney Disease.    Anion gap 8 5 - 15  Glucose, capillary     Status: Abnormal   Collection Time: 08/23/16  7:23 AM  Result Value Ref Range   Glucose-Capillary 120 (H) 65 - 99 mg/dL   Comment 1 Notify RN    Comment 2 Document in Chart     ABGS No results for input(s): PHART, PO2ART, TCO2, HCO3 in the last 72 hours.  Invalid input(s): PCO2 CULTURES Recent Results  (from the past 240 hour(s))  Urine culture     Status: None   Collection Time: 08/19/16  4:50 PM  Result Value Ref Range Status   Specimen Description URINE, CLEAN CATCH  Final   Special Requests NONE  Final   Culture   Final    NO GROWTH Performed at [facility], [place] [address], [place], [place] [address]    Report Status 08/21/2016 FINAL  Final  Blood Culture (routine x 2)     Status: None (Preliminary result)   Collection Time: 08/19/16  5:05 PM  Result Value Ref Range Status   Specimen Description LEFT ANTECUBITAL  Final   Special Requests   Final    BOTTLES DRAWN AEROBIC AND ANAEROBIC Blood Culture adequate volume   Culture NO GROWTH 3 DAYS  Final   Report Status PENDING  Incomplete  Blood Culture (routine x 2)     Status: None (Preliminary result)   Collection Time: 08/19/16  5:13 PM  Result Value Ref Range Status   Specimen Description BLOOD RIGHT HAND  Final   Special Requests   Final    BOTTLES DRAWN AEROBIC AND ANAEROBIC Blood Culture adequate volume   Culture NO GROWTH 3 DAYS  Final   Report Status PENDING  Incomplete  MRSA PCR Screening     Status: Abnormal   Collection Time: 08/19/16 10:40 PM  Result Value Ref Range Status   MRSA by PCR POSITIVE (A) NEGATIVE Final    Comment:        The GeneXpert MRSA Assay (FDA approved for NASAL specimens only), is one component of a comprehensive MRSA colonization surveillance program. It is not intended to diagnose MRSA infection nor to guide or monitor treatment for MRSA infections. RESULT CALLED TO, READ BACK BY AND VERIFIED WITH: ANDURN,A AT 0215 ON 6.8.2018 BY ISLEY,B    Studies/Results: No results found.  Medications: I have reviewed the patient's current medications.  Assesment:  Principal Problem:   HCAP (healthcare-associated pneumonia) Active Problems:   Subdural hematoma ([place])   Hypertension   Diabetes mellitus without complication ([place])   Coronary artery disease    Plan:  Medications  reviewed Continue Iv antibiotics continue current treatment  Will monitor BMP    LOS: 4 days   Tiney Zipper 08/23/2016, 8:33 AM

## 2016-08-23 NOTE — Evaluation (Signed)
Physical Therapy Evaluation Patient Details Name: Regina Bush MRN: [id number] DOB: 03-12-1928 Today's Date: 08/23/2016   History of Present Illness  Regina Bush is an 83yo white female who comes to [facility] from [facility] after increased SOB, cough, and fevere, admitted here for HCAP. Pt recently moved here with husband form [place] in later 2017 after he could no longer care for her due to dementia: now they both live there. 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 The patient has been usign a WC for mobility for almost 1 year now. Due to dementia, history is supplemented by husband who is admitted accross the hall.   Clinical Impression  Pt admitted with above diagnosis. Pt currently with functional limitations due to the deficits listed below (see "PT Problem List"). Upon entry, the patient is received semirecumbent in bed, no family/caregiver present, however the patient's husband is admitted across the hall who provides additional detail about the patient's PLOF. The pt is awake and agreeable to participate, although does not feel well and is mildly agitated from speaking with so many care providers today. Pt reports some fatigue and generalized discomfort, but is A&Ox1 and does not go into detail with her complaints. The pt is alert  And pleasant, conversational, and following simple and multi-step commands consistently. She participates well with changing of gown and soiled linen in session with rolling independently, bridging and assiting in removal of gown. She declines OOB mobility at this time due to not feeling well. Pt received on and remaining on  Room air throughout evaluation, with noted saturation of >95%. Functional mobility assessment demonstrates good strength overall, but activity is somewhat limited at this time. The patient has assistance with ADL at baseline and will at time of DC. She expresses a desire to AMB  again, although she has been unable to AMB meaningful distances for >1year per husband. Pt will benefit from skilled PT intervention to increase independence and safety with basic mobility in preparation for discharge to the venue listed below.       Follow Up Recommendations Home health PT;Supervision for mobility/OOB    Equipment Recommendations  None recommended by PT    Recommendations for Other Services       Precautions / Restrictions Precautions Precautions: Fall Restrictions Weight Bearing Restrictions: No      Mobility  Bed Mobility Overal bed mobility: Needs Assistance Bed Mobility: Rolling;Supine to Sit Rolling: Modified independent (Device/Increase time) (performed 4x to assist with gown/linen changes)   Supine to sit:  (pt refuses at this time due to feeling tired. )     General bed mobility comments: Good strength with bridging.   Transfers                 General transfer comment: uninterested at this time due to not feeling well.   Ambulation/Gait                Stairs            Wheelchair Mobility    Modified Rankin (Stroke Patients Only)       Balance                                             Pertinent Vitals/Pain Pain Assessment:  (reports yes, but does not qualify after multiple attempts)  Home Living Family/patient expects to be discharged to:: Assisted living               Home Equipment: Wheelchair - manual      Prior Function Level of Independence: Needs assistance   Gait / Transfers Assistance Needed: WC for mobility, reports that she transfers with assistance and RW.   ADL's / Homemaking Assistance Needed: Rquires assistance for bathing and dressing, but she is able to feed self.         Hand Dominance        Extremity/Trunk Assessment                Communication   Communication: Expressive difficulties (some mod-late stage difficulty with language related to  dementia. (difficulty with complex questioning requiring great detail) )  Cognition Arousal/Alertness: Awake/alert Behavior During Therapy: WFL for tasks assessed/performed Overall Cognitive Status: History of cognitive impairments - at baseline                                        General Comments      Exercises     Assessment/Plan    PT Assessment Patient needs continued PT services  PT Problem List Decreased activity tolerance;Decreased mobility;Cardiopulmonary status limiting activity;Pain;Decreased safety awareness;Decreased cognition       PT Treatment Interventions Functional mobility training;Therapeutic activities;Therapeutic exercise;Patient/family education    PT Goals (Current goals can be found in the Care Plan section)  Acute Rehab PT Goals Patient Stated Goal: pt wishes to feel better overall  PT Goal Formulation: With patient Time For Goal Achievement: 09/06/16 Potential to Achieve Goals: Fair    Frequency Min 2X/week   Barriers to discharge        Co-evaluation               AM-PAC PT "6 Clicks" Daily Activity  Outcome Measure Difficulty turning over in bed (including adjusting bedclothes, sheets and blankets)?: None Difficulty moving from lying on back to sitting on the side of the bed? : A Little Difficulty sitting down on and standing up from a chair with arms (e.g., wheelchair, bedside commode, etc,.)?: Total Help needed moving to and from a bed to chair (including a wheelchair)?: Total Help needed walking in hospital room?: Total Help needed climbing 3-5 steps with a railing? : Total 6 Click Score: 11    End of Session   Activity Tolerance: Patient tolerated treatment well;Other (comment) (some dizziness with rolling to Left ) Patient left: in bed;with bed alarm set;with call bell/phone within reach Nurse Communication: Other (comment) PT Visit Diagnosis: Muscle weakness (generalized) (M62.81);Dizziness and giddiness  (R42);Other abnormalities of gait and mobility (R26.89)    Time: 1111-1141 PT Time Calculation (min) (ACUTE ONLY): 30 min   Charges:   PT Evaluation $PT Eval Moderate Complexity: 1 Procedure PT Treatments $Therapeutic Activity: 8-22 mins   PT G Codes:        1:10 PM, 17-Sep-2016 Etta Grandchild, PT, DPT Physical Therapist - [place] [phone] (Office)    Yao [place] C 09-17-16, 1:05 PM

## 2016-08-24 ENCOUNTER — Encounter (HOSPITAL_COMMUNITY): Payer: Self-pay

## 2016-08-24 ENCOUNTER — Inpatient Hospital Stay (HOSPITAL_COMMUNITY): Payer: Medicare Other

## 2016-08-24 LAB — CULTURE, BLOOD (ROUTINE X 2)
CULTURE: NO GROWTH
CULTURE: NO GROWTH
Special Requests: ADEQUATE
Special Requests: ADEQUATE

## 2016-08-24 LAB — GLUCOSE, CAPILLARY
GLUCOSE-CAPILLARY: 210 mg/dL — AB (ref 65–99)
GLUCOSE-CAPILLARY: 124 mg/dL — AB (ref 65–99)
Glucose-Capillary: 123 mg/dL — ABNORMAL HIGH (ref 65–99)
Glucose-Capillary: 156 mg/dL — ABNORMAL HIGH (ref 65–99)

## 2016-08-24 LAB — VANCOMYCIN, TROUGH: Vancomycin Tr: 14 ug/mL — ABNORMAL LOW (ref 15–20)

## 2016-08-24 NOTE — Progress Notes (Signed)
Subjective: Patient is more alert and awake. Her breath is better and she is improving. Objective: Vital signs in last 24 hours: Temp:  [98.2 F (36.8 C)-98.5 F (36.9 C)] 98.2 F (36.8 C) (06/12 0646) Pulse Rate:  [64-66] 64 (06/12 0646) Resp:  [18-20] 20 (06/12 0646) BP: (128-134)/(62-75) 128/68 (06/12 0646) SpO2:  [97 %-98 %] 97 % (06/12 0646) Weight change:  Last BM Date: 08/23/16  Intake/Output from previous day: 06/11 0701 - 06/12 0700 In: 869.5 [P.O.:60; I.V.:509.5; IV Piggyback:300] Out: -   PHYSICAL EXAM General appearance: cooperative and no distress Resp: diminished breath sounds bilaterally and rhonchi bilaterally Cardio: S1, S2 normal GI: soft, non-tender; bowel sounds normal; no masses,  no organomegaly Extremities: extremities normal, atraumatic, no cyanosis or edema  Lab Results:  Results for orders placed or performed during the hospital encounter of 08/19/16 (from the past 48 hour(s))  Glucose, capillary     Status: Abnormal   Collection Time: 08/22/16  4:26 PM  Result Value Ref Range   Glucose-Capillary 147 (H) 65 - 99 mg/dL  Glucose, capillary     Status: Abnormal   Collection Time: 08/22/16  8:56 PM  Result Value Ref Range   Glucose-Capillary 165 (H) 65 - 99 mg/dL  Basic metabolic panel     Status: Abnormal   Collection Time: 08/23/16  5:33 AM  Result Value Ref Range   Sodium 138 135 - 145 mmol/L   Potassium 3.6 3.5 - 5.1 mmol/L   Chloride 106 101 - 111 mmol/L   CO2 24 22 - 32 mmol/L   Glucose, Bld 125 (H) 65 - 99 mg/dL   BUN 23 (H) 6 - 20 mg/dL   Creatinine, Ser 1.17 (H) 0.44 - 1.00 mg/dL   Calcium 9.0 8.9 - 10.3 mg/dL   GFR calc non Af Amer 41 (L) >60 mL/min   GFR calc Af Amer 47 (L) >60 mL/min    Comment: (NOTE) The eGFR has been calculated using the CKD EPI equation. This calculation has not been validated in all clinical situations. eGFR's persistently <60 mL/min signify possible Chronic Kidney Disease.    Anion gap 8 5 - 15   Glucose, capillary     Status: Abnormal   Collection Time: 08/23/16  7:23 AM  Result Value Ref Range   Glucose-Capillary 120 (H) 65 - 99 mg/dL   Comment 1 Notify RN    Comment 2 Document in Chart   Glucose, capillary     Status: Abnormal   Collection Time: 08/23/16 11:48 AM  Result Value Ref Range   Glucose-Capillary 154 (H) 65 - 99 mg/dL   Comment 1 Notify RN    Comment 2 Document in Chart   Glucose, capillary     Status: Abnormal   Collection Time: 08/23/16  4:07 PM  Result Value Ref Range   Glucose-Capillary 136 (H) 65 - 99 mg/dL   Comment 1 Notify RN    Comment 2 Document in Chart   Glucose, capillary     Status: Abnormal   Collection Time: 08/23/16  8:53 PM  Result Value Ref Range   Glucose-Capillary 162 (H) 65 - 99 mg/dL  Vancomycin, trough     Status: Abnormal   Collection Time: 08/23/16 11:21 PM  Result Value Ref Range   Vancomycin Tr 14 (L) 15 - 20 ug/mL  Glucose, capillary     Status: Abnormal   Collection Time: 08/24/16  7:49 AM  Result Value Ref Range   Glucose-Capillary 123 (H) 65 - 99 mg/dL  Glucose, capillary     Status: Abnormal   Collection Time: 08/24/16 11:32 AM  Result Value Ref Range   Glucose-Capillary 210 (H) 65 - 99 mg/dL   Comment 1 Notify RN    Comment 2 Document in Chart     ABGS No results for input(s): PHART, PO2ART, TCO2, HCO3 in the last 72 hours.  Invalid input(s): PCO2 CULTURES Recent Results (from the past 240 hour(s))  Urine culture     Status: None   Collection Time: 08/19/16  4:50 PM  Result Value Ref Range Status   Specimen Description URINE, CLEAN CATCH  Final   Special Requests NONE  Final   Culture   Final    NO GROWTH Performed at [facility], [address], [place], [place] [address]    Report Status 08/21/2016 FINAL  Final  Blood Culture (routine x 2)     Status: None (Preliminary result)   Collection Time: 08/19/16  5:05 PM  Result Value Ref Range Status   Specimen Description LEFT ANTECUBITAL  Final    Special Requests   Final    BOTTLES DRAWN AEROBIC AND ANAEROBIC Blood Culture adequate volume   Culture NO GROWTH 4 DAYS  Final   Report Status PENDING  Incomplete  Blood Culture (routine x 2)     Status: None (Preliminary result)   Collection Time: 08/19/16  5:13 PM  Result Value Ref Range Status   Specimen Description BLOOD RIGHT HAND  Final   Special Requests   Final    BOTTLES DRAWN AEROBIC AND ANAEROBIC Blood Culture adequate volume   Culture NO GROWTH 4 DAYS  Final   Report Status PENDING  Incomplete  MRSA PCR Screening     Status: Abnormal   Collection Time: 08/19/16 10:40 PM  Result Value Ref Range Status   MRSA by PCR POSITIVE (A) NEGATIVE Final    Comment:        The GeneXpert MRSA Assay (FDA approved for NASAL specimens only), is one component of a comprehensive MRSA colonization surveillance program. It is not intended to diagnose MRSA infection nor to guide or monitor treatment for MRSA infections. RESULT CALLED TO, READ BACK BY AND VERIFIED WITH: ANDURN,A AT 0215 ON 6.8.2018 BY ISLEY,B    Studies/Results: No results found.  Medications: I have reviewed the patient's current medications.  Assesment:  Principal Problem:   HCAP (healthcare-associated pneumonia) Active Problems:   Subdural hematoma ([place])   Hypertension   Diabetes mellitus without complication ([place])   Coronary artery disease    Plan:  Medications reviewed Will D/C vancomycin Will repeat chest x-ray continue current treatment  Will monitor BMP    LOS: 5 days   Flor Houdeshell 08/24/2016, 12:08 PM

## 2016-08-24 NOTE — Care Management Note (Signed)
Case Management Note  Patient Details  Name: Regina Bush MRN: [id number] Date of Birth: 08/23/1927  Status of Service:  In process, will continue to follow  If discussed at Long Length of Stay Meetings, dates discussed:    Additional Comments: 08/24/2016  Seiya Silsby, Chauncey Reading, RN 08/24/2016, 10:42 AM

## 2016-08-24 NOTE — Progress Notes (Signed)
  Speech Language Pathology Treatment: Dysphagia  Patient Details Name: Regina Bush MRN: [id number] DOB: Sep 06, 1927 Today's Date: 08/24/2016 Time: 4037-5436 SLP Time Calculation (min) (ACUTE ONLY): 15 min  Assessment / Plan / Recommendation Clinical Impression  Pt seen at bedside for ongoing diagnostic dysphagia intervention. Pt consumed most of regular breakfast tray. She was observed with thin liquids via straw sips and exhibited no overt signs or symptoms of decreased airway protection or oropharyngeal residuals. Pt denies difficulty swallowing as well. It was difficult to position Pt in this bed, she would be safer up in chair ideally. SLP will sign off at this time.   HPI HPI: Regina Bush a 83 y.o.femalewith medical history significant of dementia, wheelchair bound, recent uti on amoxicillin (9days) CAD, DM, HTN, FTT comes in with 2 days of fever. She complains of sob and cough. dtr is present and only knows she has been running fever. Found to have possible pna. Pt known to this SLP from [facility] completed February 2018 while she was at [facility] for rehab. She was discharged from [facility] to [facility] on a regular diet with thin liquids. Pt tells SLP that she takes her medications whole in applesauce. Current chest x-ray shows: Mild atelectasis or infiltrate at the right base.      SLP Plan  All goals met;Discharge SLP treatment due to (comment)       Recommendations  Diet recommendations: Regular;Thin liquid Liquids provided via: Cup;Straw Medication Administration: Whole meds with puree Supervision: Patient able to self feed;Intermittent supervision to cue for compensatory strategies Postural Changes and/or Swallow Maneuvers: Seated upright 90 degrees;Upright 30-60 min after meal                Oral Care Recommendations: Oral care BID;Staff/trained caregiver to provide oral care Follow up Recommendations: 24 hour supervision/assistance SLP Visit Diagnosis: Dysphagia, oropharyngeal  phase (R13.12) Plan: All goals met;Discharge SLP treatment due to (comment)       Thank you,  Genene Churn, [place]                [place] 08/24/2016, 11:14 AM

## 2016-08-25 LAB — BASIC METABOLIC PANEL
Anion gap: 8 (ref 5–15)
BUN: 25 mg/dL — AB (ref 6–20)
CALCIUM: 9.5 mg/dL (ref 8.9–10.3)
CO2: 24 mmol/L (ref 22–32)
CREATININE: 1.28 mg/dL — AB (ref 0.44–1.00)
Chloride: 109 mmol/L (ref 101–111)
GFR calc Af Amer: 42 mL/min — ABNORMAL LOW (ref >60)
GFR, EST NON AFRICAN AMERICAN: 36 mL/min — AB (ref >60)
GLUCOSE: 118 mg/dL — AB (ref 65–99)
Potassium: 3.5 mmol/L (ref 3.5–5.1)
Sodium: 141 mmol/L (ref 135–145)

## 2016-08-25 LAB — GLUCOSE, CAPILLARY
Glucose-Capillary: 117 mg/dL — ABNORMAL HIGH (ref 65–99)
Glucose-Capillary: 198 mg/dL — ABNORMAL HIGH (ref 65–99)
Glucose-Capillary: 115 mg/dL — ABNORMAL HIGH (ref 65–99)

## 2016-08-25 MED ORDER — AMOXICILLIN 500 MG PO CAPS: 500.0000 mg | ORAL_CAPSULE | Freq: Three times a day (TID) | ORAL | 0 refills | Status: AC

## 2016-08-25 MED ORDER — ACETAMINOPHEN 325 MG PO TABS: 650.0000 mg | ORAL_TABLET | Freq: Four times a day (QID) | ORAL | 3 refills | Status: DC | PRN

## 2016-08-25 NOTE — Progress Notes (Signed)
Physical Therapy Treatment Patient Details Name: Regina Bush MRN: [id number] DOB: [birth year] Today's Date: 08/25/2016    History of Present Illness Regina Bush is an 83yo white female who comes to [facility] from [facility] after increased SOB, cough, and fevere, admitted here for HCAP. Pt recently moved here with husband form [place] in later 2017 after he could no longer care for her due to dementia: now they both live there. 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 The patient has been usign a WC for mobility for almost 1 year now. Due to dementia, history is supplemented by husband who is admitted accross the hall.     PT Comments    Pt tolerating treatment session well, motivated and able to complete entire PT sesssion as planned. Pt continues to make progress toward goals as evidenced by tolerance of seated bed exercises without desaturation on room air. Pt's greatest limitation continues to be weakness in hips, postural limitations, and falls anxiety, which continus to limit ability to perform transfers at baseline function. Pt is more alert today and agreeable to participate but overall remains somewhat weak and fatigued. Patient will benefit from skilled intervention to address the above impairments and limitations, in order to restore to prior level of function, improve patient safety upon discharge, and to decrease caregiver burden.      Follow Up Recommendations  Home health PT;Supervision for mobility/OOB     Equipment Recommendations  None recommended by PT    Recommendations for Other Services       Precautions / Restrictions Precautions Precautions: Fall Restrictions Weight Bearing Restrictions: No    Mobility  Bed Mobility Overal bed mobility: Needs Assistance       Supine to sit: Supervision Sit to supine: Mod assist   General bed mobility comments: difficulty bringing legs back into bed    Transfers Overall transfer level: Needs assistance Equipment used: 1 person hand held assist;None Transfers: Sit to/from Stand Sit to Stand: From elevated surface         General transfer comment: attempted 4x, with up-to maxA, but patient anxious assuming forward lean, and fearful of falling. Pt notably weak.   Ambulation/Gait                 Stairs            Wheelchair Mobility    Modified Rankin (Stroke Patients Only)       Balance                                            Cognition Arousal/Alertness: Awake/alert Behavior During Therapy: WFL for tasks assessed/performed Overall Cognitive Status: History of cognitive impairments - at baseline                                        Exercises General Exercises - Lower Extremity Long Arc Quad: AROM;Both;15 reps;Seated Hip Flexion/Marching: AROM;Both;15 reps;Seated Other Exercises Other Exercises: unilateral reach to floor: 1x10 bilat  Other Exercises: Unilateral lateral reaching: 1x10 (modifed to avoid shoulder pain)  Other Exercises: Forward/overhead reach: 1x10 bilat (limited ROM <90 degrees)     General Comments        Pertinent Vitals/Pain Pain Assessment: No/denies pain    Home  Living                      Prior Function            PT Goals (current goals can now be found in the care plan section) Acute Rehab PT Goals Patient Stated Goal: pt wishes to feel better overall  Time For Goal Achievement: 09/06/16 Potential to Achieve Goals: Fair Progress towards PT goals: Progressing toward goals    Frequency    Min 2X/week      PT Plan Current plan remains appropriate    Co-evaluation              AM-PAC PT "6 Clicks" Daily Activity  Outcome Measure  Difficulty turning over in bed (including adjusting bedclothes, sheets and blankets)?: A Little Difficulty moving from lying on back to sitting on the side of the bed? : A  Lot Difficulty sitting down on and standing up from a chair with arms (e.g., wheelchair, bedside commode, etc,.)?: Total Help needed moving to and from a bed to chair (including a wheelchair)?: Total Help needed walking in hospital room?: Total Help needed climbing 3-5 steps with a railing? : Total 6 Click Score: 9    End of Session   Activity Tolerance: Patient tolerated treatment well;No increased pain Patient left: in bed;with bed alarm set;with call bell/phone within reach Nurse Communication: Other (comment) (flat sheet soiled, needs new one) PT Visit Diagnosis: Muscle weakness (generalized) (M62.81);Dizziness and giddiness (R42);Other abnormalities of gait and mobility (R26.89)     Time: 1430-1450 PT Time Calculation (min) (ACUTE ONLY): 20 min  Charges:  $Therapeutic Exercise: 8-22 mins                    G Codes:       2:57 PM, 09-08-16 Etta Grandchild, PT, DPT Physical Therapist - [place] [phone] (Office)     Regina Bush 2016-09-08, 2:54 PM

## 2016-08-25 NOTE — Clinical Social Work Note (Addendum)
LCSW spoke with patient's daughter, Butch Penny, and advised of discharge. She indicated that she would pick up patient and transport him back to the facility. Clinicals were sent to facility.   LCSW spoke with [name] at [facility]. She advised that patient was discharging today and daughter would be bringing him back to the facility.    Hollyn Stucky, Clydene Pugh, LCSW

## 2016-08-25 NOTE — Care Management Important Message (Signed)
Important Message  Patient Details  Name: Regina Bush MRN: [id number] Date of Birth: 04/29/1927   Medicare Important Message Given:  Yes    Daryll Spisak, Chauncey Reading, RN 08/25/2016, 2:03 PM

## 2016-08-25 NOTE — Progress Notes (Signed)
Gave report to [facility] nurse.

## 2016-08-25 NOTE — Clinical Social Work Note (Signed)
LCSW notified patient's daughter, Butch Penny, that patient would be discharging. She advised that she would pick patient up and transport back to the facility.  LCSW notified [name] at [facility] of patient's discharge.  LCSW sent clinicals to the facility.    LCSW signing off.     Salima Rumer, Clydene Pugh, LCSW

## 2016-08-25 NOTE — Progress Notes (Signed)
Patient's PICC line was removed by M. Dishmon, Therapist, sports.  Awaiting daughter to pick up patient.

## 2016-08-25 NOTE — Discharge Summary (Signed)
Physician Discharge Summary  Patient ID: Regina Bush MRN: [id number] DOB/AGE: 81-Jun-1929 81 y.o. Primary Care Physician:Hawkins, Percell Miller, MD Admit date: 08/19/2016 Discharge date: 08/25/2016    Discharge Diagnoses:    Principal Problem:   HCAP (healthcare-associated pneumonia) Active Problems:   Subdural hematoma ([place])   Hypertension   Diabetes mellitus without complication ([place])   Coronary artery disease   Allergies as of 08/25/2016      Reactions   Cymbalta [duloxetine Hcl] Other (See Comments)   unknown   Effexor [venlafaxine] Other (See Comments)   unknown   Morphine And Related Other (See Comments)   unknown   Tape    adhesive      Medication List    TAKE these medications   acetaminophen 325 MG tablet Commonly known as:  TYLENOL Take 2 tablets (650 mg total) by mouth every 6 (six) hours as needed for mild pain or moderate pain. What changed:  when to take this   amLODipine 10 MG tablet Commonly known as:  NORVASC Place 10 mg into feeding tube daily.   amoxicillin 500 MG capsule Commonly known as:  AMOXIL Take 1 capsule (500 mg total) by mouth 3 (three) times daily.   aspirin EC 81 MG tablet Take 81 mg by mouth daily.   atorvastatin 20 MG tablet Commonly known as:  LIPITOR Place 20 mg into feeding tube daily.   BENEFIBER PO 10 mLs by Gastric Tube route daily.   cholecalciferol 1000 units tablet Commonly known as:  VITAMIN D Place 1,000 Units into feeding tube daily.   famotidine 20 MG tablet Commonly known as:  PEPCID Place 20 mg into feeding tube daily.   fluticasone 50 MCG/ACT nasal spray Commonly known as:  FLONASE Place 2 sprays into both nostrils daily.   hydrALAZINE 25 MG tablet Commonly known as:  APRESOLINE Place 25 mg into feeding tube 3 (three) times daily.   levothyroxine 50 MCG tablet Commonly known as:  SYNTHROID, LEVOTHROID Place 50 mcg into feeding tube daily before breakfast.   magnesium oxide 400 MG tablet Commonly known  as:  MAG-OX Place 400 mg into feeding tube daily.   multivitamin with minerals Tabs tablet Place 1 tablet into feeding tube daily.   nystatin powder Commonly known as:  MYCOSTATIN/NYSTOP Apply topically 2 (two) times daily as needed (for redness).       Discharged Condition: improved    Consults: pulmonary  Significant Diagnostic Studies: Dg Chest 1 View  Result Date: 08/24/2016 CLINICAL DATA:  Pneumonia EXAM: CHEST 1 VIEW COMPARISON:  08/19/2016 FINDINGS: Cardiac enlargement with vascular congestion. Negative for edema or effusion Patchy by based airspace disease unchanged may represent atelectasis or pneumonia. Right arm PICC tip in the right atrium at the cavoatrial junction IMPRESSION: Cardiac enlargement with vascular congestion Mild bibasilar airspace disease unchanged. Electronically Signed   By: Franchot Gallo M.D.   On: 08/24/2016 15:05   Dg Tibia/fibula Right  Result Date: 08/09/2016 CLINICAL DATA:  Swelling and pain. EXAM: RIGHT TIBIA AND FIBULA - 2 VIEW COMPARISON:  None. FINDINGS: A screw traverses the proximal tibia. Soft tissue edema. Vascular calcifications. No joint effusion, fracture, or bony erosion. Osteopenia. IMPRESSION: No acute bony abnormality. Electronically Signed   By: Dorise Bullion III M.D   On: 08/09/2016 16:43   US Venous Img Lower Unilateral Right  Result Date: 08/10/2016 CLINICAL DATA:  Lower extremity swelling. RIGHT lower extremity pain. EXAM: RIGHT LOWER EXTREMITY VENOUS DOPPLER ULTRASOUND TECHNIQUE: Gray-scale sonography with graded compression, as well as color Doppler  and duplex ultrasound were performed to evaluate the lower extremity deep venous systems from the level of the common femoral vein and including the common femoral, femoral, profunda femoral, popliteal and calf veins including the posterior tibial, peroneal and gastrocnemius veins when visible. The superficial great saphenous vein was also interrogated. Spectral Doppler was utilized  to evaluate flow at rest and with distal augmentation maneuvers in the common femoral, femoral and popliteal veins. COMPARISON:  None. FINDINGS: Contralateral Common Femoral Vein: Respiratory phasicity is normal and symmetric with the symptomatic side. No evidence of thrombus. Normal compressibility. Common Femoral Vein: No evidence of thrombus. Normal compressibility, respiratory phasicity and response to augmentation. Saphenofemoral Junction: No evidence of thrombus. Normal compressibility and flow on color Doppler imaging. Profunda Femoral Vein: No evidence of thrombus. Normal compressibility and flow on color Doppler imaging. Femoral Vein: No evidence of thrombus. Normal compressibility, respiratory phasicity and response to augmentation. Popliteal Vein: No evidence of thrombus. Normal compressibility, respiratory phasicity and response to augmentation. Calf Veins: No evidence of thrombus. Normal compressibility and flow on color Doppler imaging. Superficial Great Saphenous Vein: No evidence of thrombus. Normal compressibility and flow on color Doppler imaging. IMPRESSION: No evidence of DVT within the RIGHT lower extremity. Electronically Signed   By: Suzy Bouchard M.D.   On: 08/10/2016 15:42   Dg Chest Portable 1 View  Result Date: 08/19/2016 CLINICAL DATA:  Shortness of breath EXAM: PORTABLE CHEST 1 VIEW COMPARISON:  None. FINDINGS: Mild to moderate cardiomegaly without overt pulmonary edema. Mild atelectasis or infiltrate at the right base. No large pleural effusion. Aortic atherosclerosis. No pneumothorax. IMPRESSION: 1. Mild atelectasis or infiltrate at the right base 2. Cardiomegaly without edema Electronically Signed   By: Donavan Foil M.D.   On: 08/19/2016 18:16    Lab Results: Basic Metabolic Panel:  Recent Labs  08/23/16 0533 08/25/16 0455  NA 138 141  K 3.6 3.5  CL 106 109  CO2 24 24  GLUCOSE 125* 118*  BUN 23* 25*  CREATININE 1.17* 1.28*  CALCIUM 9.0 9.5   Liver Function  Tests: No results for input(s): AST, ALT, ALKPHOS, BILITOT, PROT, ALBUMIN in the last 72 hours.   CBC: No results for input(s): WBC, NEUTROABS, HGB, HCT, MCV, PLT in the last 72 hours.  Recent Results (from the past 240 hour(s))  Urine culture     Status: None   Collection Time: 08/19/16  4:50 PM  Result Value Ref Range Status   Specimen Description URINE, CLEAN CATCH  Final   Special Requests NONE  Final   Culture   Final    NO GROWTH Performed at [facility], [address], [place], [place] [address]    Report Status 08/21/2016 FINAL  Final  Blood Culture (routine x 2)     Status: None   Collection Time: 08/19/16  5:05 PM  Result Value Ref Range Status   Specimen Description LEFT ANTECUBITAL  Final   Special Requests   Final    BOTTLES DRAWN AEROBIC AND ANAEROBIC Blood Culture adequate volume   Culture NO GROWTH 5 DAYS  Final   Report Status 08/24/2016 FINAL  Final  Blood Culture (routine x 2)     Status: None   Collection Time: 08/19/16  5:13 PM  Result Value Ref Range Status   Specimen Description BLOOD RIGHT HAND  Final   Special Requests   Final    BOTTLES DRAWN AEROBIC AND ANAEROBIC Blood Culture adequate volume   Culture NO GROWTH 5 DAYS  Final   Report Status 08/24/2016 FINAL  Final  MRSA PCR Screening     Status: Abnormal   Collection Time: 08/19/16 10:40 PM  Result Value Ref Range Status   MRSA by PCR POSITIVE (A) NEGATIVE Final    Comment:        The GeneXpert MRSA Assay (FDA approved for NASAL specimens only), is one component of a comprehensive MRSA colonization surveillance program. It is not intended to diagnose MRSA infection nor to guide or monitor treatment for MRSA infections. RESULT CALLED TO, READ BACK BY AND VERIFIED WITH: ANDURN,A AT 0215 ON 6.8.2018 BY [facility] Course:   This is a 83 years old female with history of multiple medical illnesses was treated for health care associated pneumonia. She received combinations  of IV antibiotics. Patient improved and back to her usual health. She is discharge in stable condition on oral antibiotics. She will be discharged back to assisted living.  Discharge Exam: Blood pressure (!) 130/58, pulse 74, temperature 98.3 F (36.8 C), temperature source Oral, resp. rate 16, height 5\' 2"  (1.575 m), weight 67.6 kg (149 lb), SpO2 97 %.    Disposition: assisted living      Signed: Sharmon Cheramie   08/25/2016, 8:32 AM

## 2016-08-25 NOTE — NC FL2 (Signed)
[place] LEVEL OF CARE SCREENING TOOL     IDENTIFICATION  Patient Name: Regina Bush Birthdate: 1927-09-17 Sex: female Admission Date (Current Location): 08/19/2016  [facility] and [place] Number:  (P) [place] and Address:  [facility] [address] [place]      Provider Number: [id number]  Attending Physician Name and Address:  Sinda Du, MD  Relative Name and Phone Number:       Current Level of Care: [facility]) Hospital Recommended Level of Care: [place] Prior Approval Number:    Date Approved/Denied:   PASRR Number:    Discharge Plan: Other (Comment) [facility])    Current Diagnoses: Patient Active Problem List   Diagnosis Date Noted  . HCAP (healthcare-associated pneumonia) 08/19/2016  . Subdural hematoma ([place])   . Hypertension   . Diabetes mellitus without complication ([place])   . Coronary artery disease     Orientation RESPIRATION BLADDER Height & Weight     Self, Place  Normal Incontinent Weight: 149 lb (67.6 kg) Height:  5\' 2"  (157.5 cm)  BEHAVIORAL SYMPTOMS/MOOD NEUROLOGICAL BOWEL NUTRITION STATUS      Incontinent Diet (Heart Healthy, Carb Modified)  AMBULATORY STATUS COMMUNICATION OF NEEDS Skin   Extensive Assist Verbally Normal                       Personal Care Assistance Level of Assistance  (P) Bathing, Feeding, Dressing Bathing Assistance: Limited assistance Feeding assistance: Independent Dressing Assistance: Limited assistance     Functional Limitations Info  (P) Sight, Hearing, Speech Sight Info: Adequate Hearing Info: Adequate Speech Info: Adequate    SPECIAL CARE FACTORS FREQUENCY                       Contractures      Additional Factors Info  Code Status, Allergies, Isolation Precautions Code Status Info: DNR Allergies Info: Cymbalta, Effexor, Morphine and Related, Tape     Isolation Precautions Info: Contact Precautions, MRSA, VRE      Current Medications (08/25/2016):  This is the current hospital active medication list Current Facility-Administered Medications  Medication Dose Route Frequency Provider Last Rate Last Dose  . 0.9 %  sodium chloride infusion  250 mL Intravenous PRN Phillips Grout, MD 10 mL/hr at 08/20/16 0223 250 mL at 08/20/16 0223  . acetaminophen (TYLENOL) tablet 650 mg  650 mg Oral Q4H PRN Phillips Grout, MD   650 mg at 08/24/16 2159  . amLODipine (NORVASC) tablet 10 mg  10 mg Per Tube Daily Phillips Grout, MD   10 mg at 08/24/16 0944  . aspirin EC tablet 81 mg  81 mg Oral Daily Derrill Kay A, MD   81 mg at 08/24/16 0944  . atorvastatin (LIPITOR) tablet 20 mg  20 mg Per Tube q1800 Phillips Grout, MD   20 mg at 08/24/16 2159  . cholecalciferol (VITAMIN D) tablet 1,000 Units  1,000 Units Oral Daily Sinda Du, MD   1,000 Units at 08/20/16 1100  . famotidine (PEPCID) tablet 20 mg  20 mg Per Tube Daily Derrill Kay A, MD   20 mg at 08/24/16 0944  . fluticasone (FLONASE) 50 MCG/ACT nasal spray 2 spray  2 spray Each Nare Daily Derrill Kay A, MD   2 spray at 08/23/16 1121  . hydrALAZINE (APRESOLINE) tablet 25 mg  25 mg Per Tube TID Phillips Grout, MD   25 mg at  08/24/16 2159  . insulin aspart (novoLOG) injection 0-5 Units  0-5 Units Subcutaneous QHS Phillips Grout, MD   2 Units at 08/21/16 2228  . insulin aspart (novoLOG) injection 0-9 Units  0-9 Units Subcutaneous TID WC Phillips Grout, MD   2 Units at 08/24/16 1843  . insulin detemir (LEVEMIR) injection 5 Units  5 Units Subcutaneous QHS Sinda Du, MD   5 Units at 08/24/16 2158  . levothyroxine (SYNTHROID, LEVOTHROID) tablet 50 mcg  50 mcg Per Tube QAC breakfast Phillips Grout, MD   50 mcg at 08/25/16 [phone]  . liver oil-zinc oxide (DESITIN) 40 % ointment   Topical Once Idol, Almyra Free, PA-C      . magnesium oxide (MAG-OX) tablet 400 mg  400 mg Per Tube Daily Derrill Kay A, MD   400 mg at 08/24/16 0944  . multivitamin with minerals tablet 1  tablet  1 tablet Per Tube Daily Phillips Grout, MD   1 tablet at 08/24/16 0943  . nystatin (MYCOSTATIN/NYSTOP) topical powder   Topical BID PRN Phillips Grout, MD      . nystatin (MYCOSTATIN/NYSTOP) topical powder   Topical TID Rise Patience, MD      . piperacillin-tazobactam (ZOSYN) IVPB 3.375 g  3.375 g Intravenous Q8H Forde Dandy, MD   Stopped at 08/25/16 [phone]  . sodium chloride flush (NS) 0.9 % injection 3 mL  3 mL Intravenous Q12H Derrill Kay A, MD   3 mL at 08/22/16 2155  . sodium chloride flush (NS) 0.9 % injection 3 mL  3 mL Intravenous PRN Phillips Grout, MD   3 mL at 08/22/16 0900     Discharge Medications:   TAKE these medications   acetaminophen 325 MG tablet Commonly known as:  TYLENOL Take 2 tablets (650 mg total) by mouth every 6 (six) hours as needed for mild pain or moderate pain. What changed:  when to take this   amLODipine 10 MG tablet Commonly known as:  NORVASC Place 10 mg into feeding tube daily.   amoxicillin 500 MG capsule Commonly known as:  AMOXIL Take 1 capsule (500 mg total) by mouth 3 (three) times daily.   aspirin EC 81 MG tablet Take 81 mg by mouth daily.   atorvastatin 20 MG tablet Commonly known as:  LIPITOR Place 20 mg into feeding tube daily.   BENEFIBER PO 10 mLs by Gastric Tube route daily.   cholecalciferol 1000 units tablet Commonly known as:  VITAMIN D Place 1,000 Units into feeding tube daily.   famotidine 20 MG tablet Commonly known as:  PEPCID Place 20 mg into feeding tube daily.   fluticasone 50 MCG/ACT nasal spray Commonly known as:  FLONASE Place 2 sprays into both nostrils daily.   hydrALAZINE 25 MG tablet Commonly known as:  APRESOLINE Place 25 mg into feeding tube 3 (three) times daily.   levothyroxine 50 MCG tablet Commonly known as:  SYNTHROID, LEVOTHROID Place 50 mcg into feeding tube daily before breakfast.   magnesium oxide 400 MG tablet Commonly known as:  MAG-OX Place 400 mg into  feeding tube daily.   multivitamin with minerals Tabs tablet Place 1 tablet into feeding tube daily.   nystatin powder Commonly known as:  MYCOSTATIN/NYSTOP Apply topically 2 (two) times daily as needed (for redness).     Relevant Imaging Results:  Relevant Lab Results:   Additional Information    Regina Bush, Regina Pugh, LCSW

## 2016-08-25 NOTE — Progress Notes (Signed)
Pharmacy Antibiotic Note  Regina Bush is a 83 y.o. female admitted on 08/19/2016 with sepsis.  Pharmacy has been consulted for  Zosyn dosing.  Vancomycin has been d/c'd.  Pt is afebrile and improved. Consider transition to PO ABX to complete therapy or d/c ABX if warranted.    Plan: Zosyn 3.375gm IV every 8 hours. Follow-up micro data, labs, vitals.   Height: 5\' 2"  (157.5 cm) Weight: 149 lb (67.6 kg) IBW/kg (Calculated) : 50.1  Temp (24hrs), Avg:98.3 F (36.8 C), Min:98.2 F (36.8 C), Max:98.4 F (36.9 C)   Recent Labs Lab 08/19/16 1704 08/19/16 1722 08/19/16 2017 08/19/16 2100 08/20/16 0550 08/22/16 0635 08/23/16 0533 08/23/16 2321 08/25/16 0455  WBC 10.7*  --   --   --  9.2  --   --   --   --   CREATININE 1.45*  --   --   --  1.34* 1.41* 1.17*  --  1.28*  LATICACIDVEN  --  2.28* 0.92 1.1  --   --   --   --   --   VANCOTROUGH  --   --   --   --   --   --   --  14*  --     Estimated Creatinine Clearance: 27.9 mL/min (A) (by C-G formula based on SCr of 1.28 mg/dL (H)).    Allergies  Allergen Reactions  . Cymbalta [Duloxetine Hcl] Other (See Comments)    unknown  . Effexor [Venlafaxine] Other (See Comments)    unknown  . Morphine And Related Other (See Comments)    unknown  . Tape     adhesive   Antimicrobials this admission: Vanc 6/7 >>6/12 Zosyn 6/7 >>   Dose adjustments this admission: n/a   Microbiology results: 6/7 BCx: no growth 6/7 UCx: no growth  Thank you for allowing pharmacy to be a part of this patient's care.  Hart Robinsons A 08/25/2016 8:31 AM

## 2016-09-02 DIAGNOSIS — I10 Essential (primary) hypertension: Secondary | ICD-10-CM | POA: Diagnosis not present

## 2016-09-02 DIAGNOSIS — J189 Pneumonia, unspecified organism: Secondary | ICD-10-CM | POA: Diagnosis not present

## 2016-09-02 DIAGNOSIS — Z9181 History of falling: Secondary | ICD-10-CM | POA: Diagnosis not present

## 2016-09-02 DIAGNOSIS — I517 Cardiomegaly: Secondary | ICD-10-CM | POA: Diagnosis not present

## 2016-09-02 DIAGNOSIS — I2510 Atherosclerotic heart disease of native coronary artery without angina pectoris: Secondary | ICD-10-CM | POA: Diagnosis not present

## 2016-09-02 DIAGNOSIS — E119 Type 2 diabetes mellitus without complications: Secondary | ICD-10-CM | POA: Diagnosis not present

## 2016-09-02 DIAGNOSIS — M8580 Other specified disorders of bone density and structure, unspecified site: Secondary | ICD-10-CM | POA: Diagnosis not present

## 2016-09-02 DIAGNOSIS — Z87440 Personal history of urinary (tract) infections: Secondary | ICD-10-CM | POA: Diagnosis not present

## 2016-09-02 DIAGNOSIS — Z931 Gastrostomy status: Secondary | ICD-10-CM | POA: Diagnosis not present

## 2016-09-02 DIAGNOSIS — Z8673 Personal history of transient ischemic attack (TIA), and cerebral infarction without residual deficits: Secondary | ICD-10-CM | POA: Diagnosis not present

## 2016-09-07 DIAGNOSIS — E119 Type 2 diabetes mellitus without complications: Secondary | ICD-10-CM | POA: Diagnosis not present

## 2016-09-07 DIAGNOSIS — I517 Cardiomegaly: Secondary | ICD-10-CM | POA: Diagnosis not present

## 2016-09-07 DIAGNOSIS — J189 Pneumonia, unspecified organism: Secondary | ICD-10-CM | POA: Diagnosis not present

## 2016-09-07 DIAGNOSIS — H3530 Unspecified macular degeneration: Secondary | ICD-10-CM | POA: Diagnosis not present

## 2016-09-07 DIAGNOSIS — M8580 Other specified disorders of bone density and structure, unspecified site: Secondary | ICD-10-CM | POA: Diagnosis not present

## 2016-09-07 DIAGNOSIS — I10 Essential (primary) hypertension: Secondary | ICD-10-CM | POA: Diagnosis not present

## 2016-09-07 DIAGNOSIS — I2510 Atherosclerotic heart disease of native coronary artery without angina pectoris: Secondary | ICD-10-CM | POA: Diagnosis not present

## 2016-09-09 DIAGNOSIS — I517 Cardiomegaly: Secondary | ICD-10-CM | POA: Diagnosis not present

## 2016-09-09 DIAGNOSIS — J189 Pneumonia, unspecified organism: Secondary | ICD-10-CM | POA: Diagnosis not present

## 2016-09-09 DIAGNOSIS — I10 Essential (primary) hypertension: Secondary | ICD-10-CM | POA: Diagnosis not present

## 2016-09-09 DIAGNOSIS — E119 Type 2 diabetes mellitus without complications: Secondary | ICD-10-CM | POA: Diagnosis not present

## 2016-09-09 DIAGNOSIS — M8580 Other specified disorders of bone density and structure, unspecified site: Secondary | ICD-10-CM | POA: Diagnosis not present

## 2016-09-09 DIAGNOSIS — I2510 Atherosclerotic heart disease of native coronary artery without angina pectoris: Secondary | ICD-10-CM | POA: Diagnosis not present

## 2016-09-13 DIAGNOSIS — M8580 Other specified disorders of bone density and structure, unspecified site: Secondary | ICD-10-CM | POA: Diagnosis not present

## 2016-09-13 DIAGNOSIS — I517 Cardiomegaly: Secondary | ICD-10-CM | POA: Diagnosis not present

## 2016-09-13 DIAGNOSIS — I10 Essential (primary) hypertension: Secondary | ICD-10-CM | POA: Diagnosis not present

## 2016-09-13 DIAGNOSIS — E119 Type 2 diabetes mellitus without complications: Secondary | ICD-10-CM | POA: Diagnosis not present

## 2016-09-13 DIAGNOSIS — J189 Pneumonia, unspecified organism: Secondary | ICD-10-CM | POA: Diagnosis not present

## 2016-09-13 DIAGNOSIS — I2510 Atherosclerotic heart disease of native coronary artery without angina pectoris: Secondary | ICD-10-CM | POA: Diagnosis not present

## 2016-09-17 DIAGNOSIS — I2510 Atherosclerotic heart disease of native coronary artery without angina pectoris: Secondary | ICD-10-CM | POA: Diagnosis not present

## 2016-09-17 DIAGNOSIS — I517 Cardiomegaly: Secondary | ICD-10-CM | POA: Diagnosis not present

## 2016-09-17 DIAGNOSIS — E119 Type 2 diabetes mellitus without complications: Secondary | ICD-10-CM | POA: Diagnosis not present

## 2016-09-17 DIAGNOSIS — I10 Essential (primary) hypertension: Secondary | ICD-10-CM | POA: Diagnosis not present

## 2016-09-17 DIAGNOSIS — M8580 Other specified disorders of bone density and structure, unspecified site: Secondary | ICD-10-CM | POA: Diagnosis not present

## 2016-09-17 DIAGNOSIS — J189 Pneumonia, unspecified organism: Secondary | ICD-10-CM | POA: Diagnosis not present

## 2016-09-20 DIAGNOSIS — I2510 Atherosclerotic heart disease of native coronary artery without angina pectoris: Secondary | ICD-10-CM | POA: Diagnosis not present

## 2016-09-20 DIAGNOSIS — I517 Cardiomegaly: Secondary | ICD-10-CM | POA: Diagnosis not present

## 2016-09-20 DIAGNOSIS — M8580 Other specified disorders of bone density and structure, unspecified site: Secondary | ICD-10-CM | POA: Diagnosis not present

## 2016-09-20 DIAGNOSIS — J189 Pneumonia, unspecified organism: Secondary | ICD-10-CM | POA: Diagnosis not present

## 2016-09-20 DIAGNOSIS — E119 Type 2 diabetes mellitus without complications: Secondary | ICD-10-CM | POA: Diagnosis not present

## 2016-09-20 DIAGNOSIS — I10 Essential (primary) hypertension: Secondary | ICD-10-CM | POA: Diagnosis not present

## 2016-09-22 DIAGNOSIS — I517 Cardiomegaly: Secondary | ICD-10-CM | POA: Diagnosis not present

## 2016-09-22 DIAGNOSIS — E119 Type 2 diabetes mellitus without complications: Secondary | ICD-10-CM | POA: Diagnosis not present

## 2016-09-22 DIAGNOSIS — I2510 Atherosclerotic heart disease of native coronary artery without angina pectoris: Secondary | ICD-10-CM | POA: Diagnosis not present

## 2016-09-22 DIAGNOSIS — I10 Essential (primary) hypertension: Secondary | ICD-10-CM | POA: Diagnosis not present

## 2016-09-22 DIAGNOSIS — J189 Pneumonia, unspecified organism: Secondary | ICD-10-CM | POA: Diagnosis not present

## 2016-09-22 DIAGNOSIS — M8580 Other specified disorders of bone density and structure, unspecified site: Secondary | ICD-10-CM | POA: Diagnosis not present

## 2016-09-23 DIAGNOSIS — E119 Type 2 diabetes mellitus without complications: Secondary | ICD-10-CM | POA: Diagnosis not present

## 2016-09-23 DIAGNOSIS — N390 Urinary tract infection, site not specified: Secondary | ICD-10-CM | POA: Diagnosis not present

## 2016-09-23 DIAGNOSIS — Z8673 Personal history of transient ischemic attack (TIA), and cerebral infarction without residual deficits: Secondary | ICD-10-CM | POA: Diagnosis not present

## 2016-09-23 DIAGNOSIS — J189 Pneumonia, unspecified organism: Secondary | ICD-10-CM | POA: Diagnosis not present

## 2016-09-23 DIAGNOSIS — I10 Essential (primary) hypertension: Secondary | ICD-10-CM | POA: Diagnosis not present

## 2016-09-28 DIAGNOSIS — I2510 Atherosclerotic heart disease of native coronary artery without angina pectoris: Secondary | ICD-10-CM | POA: Diagnosis not present

## 2016-09-28 DIAGNOSIS — E119 Type 2 diabetes mellitus without complications: Secondary | ICD-10-CM | POA: Diagnosis not present

## 2016-09-28 DIAGNOSIS — I10 Essential (primary) hypertension: Secondary | ICD-10-CM | POA: Diagnosis not present

## 2016-09-28 DIAGNOSIS — I517 Cardiomegaly: Secondary | ICD-10-CM | POA: Diagnosis not present

## 2016-09-28 DIAGNOSIS — M8580 Other specified disorders of bone density and structure, unspecified site: Secondary | ICD-10-CM | POA: Diagnosis not present

## 2016-09-28 DIAGNOSIS — J189 Pneumonia, unspecified organism: Secondary | ICD-10-CM | POA: Diagnosis not present

## 2016-09-30 DIAGNOSIS — J189 Pneumonia, unspecified organism: Secondary | ICD-10-CM | POA: Diagnosis not present

## 2016-09-30 DIAGNOSIS — I10 Essential (primary) hypertension: Secondary | ICD-10-CM | POA: Diagnosis not present

## 2016-09-30 DIAGNOSIS — E119 Type 2 diabetes mellitus without complications: Secondary | ICD-10-CM | POA: Diagnosis not present

## 2016-09-30 DIAGNOSIS — I517 Cardiomegaly: Secondary | ICD-10-CM | POA: Diagnosis not present

## 2016-09-30 DIAGNOSIS — M8580 Other specified disorders of bone density and structure, unspecified site: Secondary | ICD-10-CM | POA: Diagnosis not present

## 2016-09-30 DIAGNOSIS — I2510 Atherosclerotic heart disease of native coronary artery without angina pectoris: Secondary | ICD-10-CM | POA: Diagnosis not present

## 2016-10-04 DIAGNOSIS — I2510 Atherosclerotic heart disease of native coronary artery without angina pectoris: Secondary | ICD-10-CM | POA: Diagnosis not present

## 2016-10-04 DIAGNOSIS — I10 Essential (primary) hypertension: Secondary | ICD-10-CM | POA: Diagnosis not present

## 2016-10-04 DIAGNOSIS — E119 Type 2 diabetes mellitus without complications: Secondary | ICD-10-CM | POA: Diagnosis not present

## 2016-10-04 DIAGNOSIS — J189 Pneumonia, unspecified organism: Secondary | ICD-10-CM | POA: Diagnosis not present

## 2016-10-04 DIAGNOSIS — I517 Cardiomegaly: Secondary | ICD-10-CM | POA: Diagnosis not present

## 2016-10-04 DIAGNOSIS — M8580 Other specified disorders of bone density and structure, unspecified site: Secondary | ICD-10-CM | POA: Diagnosis not present

## 2016-10-06 DIAGNOSIS — I517 Cardiomegaly: Secondary | ICD-10-CM | POA: Diagnosis not present

## 2016-10-06 DIAGNOSIS — J189 Pneumonia, unspecified organism: Secondary | ICD-10-CM | POA: Diagnosis not present

## 2016-10-06 DIAGNOSIS — I2510 Atherosclerotic heart disease of native coronary artery without angina pectoris: Secondary | ICD-10-CM | POA: Diagnosis not present

## 2016-10-06 DIAGNOSIS — I10 Essential (primary) hypertension: Secondary | ICD-10-CM | POA: Diagnosis not present

## 2016-10-06 DIAGNOSIS — M8580 Other specified disorders of bone density and structure, unspecified site: Secondary | ICD-10-CM | POA: Diagnosis not present

## 2016-10-06 DIAGNOSIS — E119 Type 2 diabetes mellitus without complications: Secondary | ICD-10-CM | POA: Diagnosis not present

## 2016-10-11 DIAGNOSIS — M8580 Other specified disorders of bone density and structure, unspecified site: Secondary | ICD-10-CM | POA: Diagnosis not present

## 2016-10-11 DIAGNOSIS — I517 Cardiomegaly: Secondary | ICD-10-CM | POA: Diagnosis not present

## 2016-10-11 DIAGNOSIS — J189 Pneumonia, unspecified organism: Secondary | ICD-10-CM | POA: Diagnosis not present

## 2016-10-11 DIAGNOSIS — E119 Type 2 diabetes mellitus without complications: Secondary | ICD-10-CM | POA: Diagnosis not present

## 2016-10-11 DIAGNOSIS — I10 Essential (primary) hypertension: Secondary | ICD-10-CM | POA: Diagnosis not present

## 2016-10-11 DIAGNOSIS — I2510 Atherosclerotic heart disease of native coronary artery without angina pectoris: Secondary | ICD-10-CM | POA: Diagnosis not present

## 2016-10-20 DIAGNOSIS — I517 Cardiomegaly: Secondary | ICD-10-CM | POA: Diagnosis not present

## 2016-10-20 DIAGNOSIS — I2510 Atherosclerotic heart disease of native coronary artery without angina pectoris: Secondary | ICD-10-CM | POA: Diagnosis not present

## 2016-10-20 DIAGNOSIS — J189 Pneumonia, unspecified organism: Secondary | ICD-10-CM | POA: Diagnosis not present

## 2016-10-20 DIAGNOSIS — E119 Type 2 diabetes mellitus without complications: Secondary | ICD-10-CM | POA: Diagnosis not present

## 2016-10-20 DIAGNOSIS — I10 Essential (primary) hypertension: Secondary | ICD-10-CM | POA: Diagnosis not present

## 2016-10-20 DIAGNOSIS — M8580 Other specified disorders of bone density and structure, unspecified site: Secondary | ICD-10-CM | POA: Diagnosis not present

## 2016-11-15 ENCOUNTER — Inpatient Hospital Stay (HOSPITAL_COMMUNITY): Payer: Medicare Other

## 2016-11-15 ENCOUNTER — Encounter (HOSPITAL_COMMUNITY): Payer: Self-pay | Admitting: Emergency Medicine

## 2016-11-15 ENCOUNTER — Inpatient Hospital Stay (HOSPITAL_COMMUNITY)
Admission: EM | Admit: 2016-11-15 | Discharge: 2016-11-19 | DRG: 689 | Disposition: A | Discharge status: Skilled Nursing Facility | Payer: Medicare Other | Attending: Pulmonary Disease | Admitting: Pulmonary Disease

## 2016-11-15 ENCOUNTER — Emergency Department (HOSPITAL_COMMUNITY): Payer: Medicare Other

## 2016-11-15 DIAGNOSIS — F0390 Unspecified dementia without behavioral disturbance: Secondary | ICD-10-CM | POA: Diagnosis present

## 2016-11-15 DIAGNOSIS — M25512 Pain in left shoulder: Secondary | ICD-10-CM

## 2016-11-15 DIAGNOSIS — S4992XA Unspecified injury of left shoulder and upper arm, initial encounter: Secondary | ICD-10-CM | POA: Diagnosis not present

## 2016-11-15 DIAGNOSIS — M25511 Pain in right shoulder: Secondary | ICD-10-CM | POA: Diagnosis not present

## 2016-11-15 DIAGNOSIS — M6281 Muscle weakness (generalized): Secondary | ICD-10-CM | POA: Diagnosis not present

## 2016-11-15 DIAGNOSIS — Z888 Allergy status to other drugs, medicaments and biological substances status: Secondary | ICD-10-CM

## 2016-11-15 DIAGNOSIS — Z7982 Long term (current) use of aspirin: Secondary | ICD-10-CM

## 2016-11-15 DIAGNOSIS — Z7984 Long term (current) use of oral hypoglycemic drugs: Secondary | ICD-10-CM | POA: Diagnosis not present

## 2016-11-15 DIAGNOSIS — R451 Restlessness and agitation: Secondary | ICD-10-CM | POA: Diagnosis not present

## 2016-11-15 DIAGNOSIS — I69391 Dysphagia following cerebral infarction: Secondary | ICD-10-CM

## 2016-11-15 DIAGNOSIS — E871 Hypo-osmolality and hyponatremia: Secondary | ICD-10-CM | POA: Diagnosis not present

## 2016-11-15 DIAGNOSIS — E1165 Type 2 diabetes mellitus with hyperglycemia: Secondary | ICD-10-CM | POA: Diagnosis present

## 2016-11-15 DIAGNOSIS — D649 Anemia, unspecified: Secondary | ICD-10-CM | POA: Diagnosis not present

## 2016-11-15 DIAGNOSIS — R309 Painful micturition, unspecified: Secondary | ICD-10-CM | POA: Diagnosis not present

## 2016-11-15 DIAGNOSIS — R279 Unspecified lack of coordination: Secondary | ICD-10-CM | POA: Diagnosis not present

## 2016-11-15 DIAGNOSIS — Z5189 Encounter for other specified aftercare: Secondary | ICD-10-CM | POA: Diagnosis not present

## 2016-11-15 DIAGNOSIS — R739 Hyperglycemia, unspecified: Secondary | ICD-10-CM

## 2016-11-15 DIAGNOSIS — I2510 Atherosclerotic heart disease of native coronary artery without angina pectoris: Secondary | ICD-10-CM | POA: Diagnosis present

## 2016-11-15 DIAGNOSIS — Z87440 Personal history of urinary (tract) infections: Secondary | ICD-10-CM | POA: Diagnosis not present

## 2016-11-15 DIAGNOSIS — N3941 Urge incontinence: Secondary | ICD-10-CM | POA: Diagnosis not present

## 2016-11-15 DIAGNOSIS — R402411 Glasgow coma scale score 13-15, in the field [EMT or ambulance]: Secondary | ICD-10-CM | POA: Diagnosis not present

## 2016-11-15 DIAGNOSIS — R4182 Altered mental status, unspecified: Secondary | ICD-10-CM | POA: Diagnosis not present

## 2016-11-15 DIAGNOSIS — M1990 Unspecified osteoarthritis, unspecified site: Secondary | ICD-10-CM | POA: Diagnosis not present

## 2016-11-15 DIAGNOSIS — Q618 Other cystic kidney diseases: Secondary | ICD-10-CM | POA: Diagnosis not present

## 2016-11-15 DIAGNOSIS — N183 Chronic kidney disease, stage 3 (moderate): Secondary | ICD-10-CM | POA: Diagnosis not present

## 2016-11-15 DIAGNOSIS — I10 Essential (primary) hypertension: Secondary | ICD-10-CM | POA: Diagnosis present

## 2016-11-15 DIAGNOSIS — N390 Urinary tract infection, site not specified: Secondary | ICD-10-CM

## 2016-11-15 DIAGNOSIS — E860 Dehydration: Secondary | ICD-10-CM | POA: Diagnosis present

## 2016-11-15 DIAGNOSIS — Z66 Do not resuscitate: Secondary | ICD-10-CM | POA: Diagnosis present

## 2016-11-15 DIAGNOSIS — Z885 Allergy status to narcotic agent status: Secondary | ICD-10-CM

## 2016-11-15 DIAGNOSIS — E1140 Type 2 diabetes mellitus with diabetic neuropathy, unspecified: Secondary | ICD-10-CM | POA: Diagnosis not present

## 2016-11-15 DIAGNOSIS — G9341 Metabolic encephalopathy: Secondary | ICD-10-CM | POA: Diagnosis present

## 2016-11-15 DIAGNOSIS — R079 Chest pain, unspecified: Secondary | ICD-10-CM | POA: Diagnosis not present

## 2016-11-15 DIAGNOSIS — E119 Type 2 diabetes mellitus without complications: Secondary | ICD-10-CM

## 2016-11-15 DIAGNOSIS — N179 Acute kidney failure, unspecified: Secondary | ICD-10-CM | POA: Diagnosis not present

## 2016-11-15 DIAGNOSIS — N3001 Acute cystitis with hematuria: Secondary | ICD-10-CM

## 2016-11-15 DIAGNOSIS — R1312 Dysphagia, oropharyngeal phase: Secondary | ICD-10-CM | POA: Diagnosis not present

## 2016-11-15 DIAGNOSIS — Z8673 Personal history of transient ischemic attack (TIA), and cerebral infarction without residual deficits: Secondary | ICD-10-CM | POA: Diagnosis not present

## 2016-11-15 DIAGNOSIS — R350 Frequency of micturition: Secondary | ICD-10-CM | POA: Diagnosis not present

## 2016-11-15 DIAGNOSIS — Z79899 Other long term (current) drug therapy: Secondary | ICD-10-CM | POA: Diagnosis not present

## 2016-11-15 DIAGNOSIS — B356 Tinea cruris: Secondary | ICD-10-CM | POA: Diagnosis present

## 2016-11-15 DIAGNOSIS — I6203 Nontraumatic chronic subdural hemorrhage: Secondary | ICD-10-CM | POA: Diagnosis not present

## 2016-11-15 DIAGNOSIS — B9620 Unspecified Escherichia coli [E. coli] as the cause of diseases classified elsewhere: Secondary | ICD-10-CM | POA: Diagnosis not present

## 2016-11-15 DIAGNOSIS — F4489 Other dissociative and conversion disorders: Secondary | ICD-10-CM | POA: Diagnosis not present

## 2016-11-15 DIAGNOSIS — E11649 Type 2 diabetes mellitus with hypoglycemia without coma: Secondary | ICD-10-CM | POA: Diagnosis not present

## 2016-11-15 DIAGNOSIS — S4991XA Unspecified injury of right shoulder and upper arm, initial encounter: Secondary | ICD-10-CM | POA: Diagnosis not present

## 2016-11-15 DIAGNOSIS — B9629 Other Escherichia coli [E. coli] as the cause of diseases classified elsewhere: Secondary | ICD-10-CM | POA: Diagnosis not present

## 2016-11-15 DIAGNOSIS — E039 Hypothyroidism, unspecified: Secondary | ICD-10-CM | POA: Diagnosis not present

## 2016-11-15 LAB — CBC WITH DIFFERENTIAL/PLATELET
BASOS ABS: 0 10*3/uL (ref 0.0–0.1)
Basophils Relative: 0 %
Eosinophils Absolute: 0.1 10*3/uL (ref 0.0–0.7)
Eosinophils Relative: 1 %
HEMATOCRIT: 35.7 % — AB (ref 36.0–46.0)
Hemoglobin: 12 g/dL (ref 12.0–15.0)
Lymphocytes Relative: 21 %
Lymphs Abs: 1.9 10*3/uL (ref 0.7–4.0)
MCH: 30.9 pg (ref 26.0–34.0)
MCHC: 33.6 g/dL (ref 30.0–36.0)
MCV: 92 fL (ref 78.0–100.0)
MONO ABS: 0.6 10*3/uL (ref 0.1–1.0)
MONOS PCT: 7 %
NEUTROS ABS: 6.2 10*3/uL (ref 1.7–7.7)
NEUTROS PCT: 71 %
Platelets: 221 10*3/uL (ref 150–400)
RBC: 3.88 MIL/uL (ref 3.87–5.11)
RDW: 13.4 % (ref 11.5–15.5)
WBC: 8.9 10*3/uL (ref 4.0–10.5)

## 2016-11-15 LAB — URINALYSIS, ROUTINE W REFLEX MICROSCOPIC
Bilirubin Urine: NEGATIVE
Hgb urine dipstick: NEGATIVE
KETONES UR: NEGATIVE mg/dL
NITRITE: POSITIVE — AB
Protein, ur: 100 mg/dL — AB
SPECIFIC GRAVITY, URINE: 1.018 (ref 1.005–1.030)
pH: 5 (ref 5.0–8.0)

## 2016-11-15 LAB — BASIC METABOLIC PANEL
ANION GAP: 9 (ref 5–15)
BUN: 44 mg/dL — ABNORMAL HIGH (ref 6–20)
CHLORIDE: 97 mmol/L — AB (ref 101–111)
CO2: 23 mmol/L (ref 22–32)
Calcium: 9.2 mg/dL (ref 8.9–10.3)
Creatinine, Ser: 1.74 mg/dL — ABNORMAL HIGH (ref 0.44–1.00)
GFR calc Af Amer: 29 mL/min — ABNORMAL LOW (ref >60)
GFR calc non Af Amer: 25 mL/min — ABNORMAL LOW (ref >60)
GLUCOSE: 552 mg/dL — AB (ref 65–99)
POTASSIUM: 5.2 mmol/L — AB (ref 3.5–5.1)
Sodium: 129 mmol/L — ABNORMAL LOW (ref 135–145)

## 2016-11-15 LAB — GLUCOSE, CAPILLARY: Glucose-Capillary: 368 mg/dL — ABNORMAL HIGH (ref 65–99)

## 2016-11-15 MED ORDER — ACETAMINOPHEN 650 MG RE SUPP: 650.0000 mg | Freq: Four times a day (QID) | RECTAL | Status: DC | PRN

## 2016-11-15 MED ORDER — HYDRALAZINE HCL 25 MG PO TABS
25.0000 mg | ORAL_TABLET | Freq: Three times a day (TID) | ORAL | Status: DC
  Administered 2016-11-16 – 2016-11-19 (×7): 25 mg
  Filled 2016-11-15 (×9): qty 1

## 2016-11-15 MED ORDER — SODIUM CHLORIDE 0.9 % IV SOLN
INTRAVENOUS | Status: DC
  Administered 2016-11-15: 75 mL via INTRAVENOUS

## 2016-11-15 MED ORDER — INSULIN ASPART 100 UNIT/ML ~~LOC~~ SOLN
0.0000 [IU] | Freq: Three times a day (TID) | SUBCUTANEOUS | Status: DC
  Administered 2016-11-16: 2 [IU] via SUBCUTANEOUS

## 2016-11-15 MED ORDER — AMLODIPINE BESYLATE 5 MG PO TABS
10.0000 mg | ORAL_TABLET | Freq: Every day | ORAL | Status: DC
  Administered 2016-11-16 – 2016-11-19 (×3): 10 mg
  Filled 2016-11-15 (×4): qty 2

## 2016-11-15 MED ORDER — ADULT MULTIVITAMIN W/MINERALS CH
1.0000 | ORAL_TABLET | Freq: Every day | ORAL | Status: DC
  Administered 2016-11-16 – 2016-11-19 (×2): 1
  Filled 2016-11-15 (×3): qty 1

## 2016-11-15 MED ORDER — ACETAMINOPHEN 325 MG PO TABS
650.0000 mg | ORAL_TABLET | Freq: Four times a day (QID) | ORAL | Status: DC | PRN
  Administered 2016-11-19: 650 mg via ORAL
  Filled 2016-11-15: qty 2

## 2016-11-15 MED ORDER — HEPARIN SODIUM (PORCINE) 5000 UNIT/ML IJ SOLN
5000.0000 [IU] | Freq: Three times a day (TID) | INTRAMUSCULAR | Status: DC
  Administered 2016-11-16 – 2016-11-19 (×8): 5000 [IU] via SUBCUTANEOUS
  Filled 2016-11-15 (×9): qty 1

## 2016-11-15 MED ORDER — PSYLLIUM 95 % PO PACK
1.0000 | PACK | Freq: Every day | ORAL | Status: DC
  Administered 2016-11-16 – 2016-11-19 (×3): 1
  Filled 2016-11-15 (×7): qty 1

## 2016-11-15 MED ORDER — INSULIN ASPART 100 UNIT/ML ~~LOC~~ SOLN
0.0000 [IU] | Freq: Three times a day (TID) | SUBCUTANEOUS | Status: DC
  Administered 2016-11-16 (×2): 1 [IU] via SUBCUTANEOUS
  Administered 2016-11-16: 2 [IU] via SUBCUTANEOUS
  Administered 2016-11-17: 5 [IU] via SUBCUTANEOUS

## 2016-11-15 MED ORDER — VITAMIN D3 25 MCG (1000 UNIT) PO TABS: 1000.0000 [IU] | ORAL_TABLET | Freq: Every day | ORAL | Status: DC

## 2016-11-15 MED ORDER — DEXTROSE 5 % IV SOLN
1.0000 g | INTRAVENOUS | Status: DC
  Administered 2016-11-15 – 2016-11-18 (×4): 1 g via INTRAVENOUS
  Filled 2016-11-15 (×8): qty 10

## 2016-11-15 MED ORDER — FAMOTIDINE 20 MG PO TABS
20.0000 mg | ORAL_TABLET | Freq: Every day | ORAL | Status: DC
  Administered 2016-11-16 – 2016-11-19 (×3): 20 mg
  Filled 2016-11-15 (×4): qty 1

## 2016-11-15 MED ORDER — NYSTATIN 100000 UNIT/GM EX POWD
Freq: Two times a day (BID) | CUTANEOUS | Status: DC
  Administered 2016-11-15: 1 g via TOPICAL
  Administered 2016-11-16 – 2016-11-19 (×6): via TOPICAL
  Filled 2016-11-15 (×2): qty 15

## 2016-11-15 MED ORDER — HYDRALAZINE HCL 20 MG/ML IJ SOLN: 5.0000 mg | Freq: Four times a day (QID) | INTRAMUSCULAR | Status: DC | PRN

## 2016-11-15 MED ORDER — SODIUM CHLORIDE 0.9 % IV BOLUS (SEPSIS)
500.0000 mL | Freq: Once | INTRAVENOUS | Status: AC
  Administered 2016-11-15: 500 mL via INTRAVENOUS

## 2016-11-15 MED ORDER — CHOLECALCIFEROL 400 UNIT/ML PO LIQD
1000.0000 [IU] | Freq: Every day | ORAL | Status: DC
  Filled 2016-11-15 (×2): qty 2.5

## 2016-11-15 MED ORDER — ASPIRIN EC 81 MG PO TBEC
81.0000 mg | DELAYED_RELEASE_TABLET | Freq: Every day | ORAL | Status: DC
  Administered 2016-11-16 – 2016-11-19 (×3): 81 mg via ORAL
  Filled 2016-11-15 (×4): qty 1

## 2016-11-15 MED ORDER — FLUTICASONE PROPIONATE 50 MCG/ACT NA SUSP
2.0000 | Freq: Every day | NASAL | Status: DC
  Administered 2016-11-16 – 2016-11-19 (×3): 2 via NASAL
  Filled 2016-11-15: qty 16

## 2016-11-15 MED ORDER — LEVOTHYROXINE SODIUM 50 MCG PO TABS
50.0000 ug | ORAL_TABLET | Freq: Every day | ORAL | Status: DC
  Administered 2016-11-16 – 2016-11-19 (×4): 50 ug
  Filled 2016-11-15 (×4): qty 1

## 2016-11-15 MED ORDER — MAGNESIUM OXIDE 400 (241.3 MG) MG PO TABS
400.0000 mg | ORAL_TABLET | Freq: Every day | ORAL | Status: DC
  Administered 2016-11-16 – 2016-11-19 (×2): 400 mg
  Filled 2016-11-15 (×3): qty 1

## 2016-11-15 MED ORDER — ATORVASTATIN CALCIUM 20 MG PO TABS
20.0000 mg | ORAL_TABLET | Freq: Every day | ORAL | Status: DC
  Administered 2016-11-16 – 2016-11-19 (×3): 20 mg
  Filled 2016-11-15 (×4): qty 1

## 2016-11-15 NOTE — ED Triage Notes (Signed)
Pt from [facility] [place]. Per ems, staff stated pt had sudden onset of altered LOC. States last time this happened she had UTI. Pt alert/confused to some. Pt c/o right shoulder "i fell before I came here". Pt has skin tear noted to left elbow also. restless

## 2016-11-15 NOTE — H&P (Signed)
TRH H&P   Patient Demographics:    Regina Bush, is a 83 y.o. female  MRN: [id number]   DOB - 06/22/1927  Admit Date - 11/15/2016  Outpatient Primary MD for the patient is Sinda Du, MD  Referring MD/NP/PA:  Davonna Belling  Outpatient Specialists:   Patient coming from: [facility]  Chief Complaint  Patient presents with  . Altered Mental Status      HPI:    Regina Bush  is a 83 y.o. female, w dementia, hypertension, Dm2, CAD , CVA , subdural hematoma, apparently was at ALF and throwing her food. Seemed altered according to her family as she typically gets when has uti.     In ED,  Pt had glucose 552, and Bun/creat 44/1.74,  Na 129, ,ua wbc tntc,   pt will be admitted for AMS secondary to UTI, and Hyperglycemia.     Review of systems:    In addition to the HPI above,  No Fever-chills, No Headache, No changes with Vision or hearing, No problems swallowing food or Liquids, No Chest pain, Cough or Shortness of Breath, No Abdominal pain, No Nausea or Vommitting, Bowel movements are regular, No Blood in stool or Urine, No dysuria, No new skin rashes or bruises, No new joints pains-aches,  No new weakness, tingling, numbness in any extremity, No recent weight gain or loss, No polyuria, polydypsia or polyphagia, No significant Mental Stressors.  A full 10 point Review of Systems was done, except as stated above, all other Review of Systems were negative.   With Past History of the following :    Past Medical History:  Diagnosis Date  . Anemia   . Arthritis   . Cerebral infarction ([place])   . Coronary artery disease   . Dementia   . Diabetes mellitus without complication (HCC)    Type 2  . Dysphagia due to recent cerebral infarction   . Failure to thrive (0-17)   . Hypercalcemia   . Hypertension   . Pneumonitis   . Protein calorie malnutrition  ([place])   . Subdural hematoma ([place])   . Thyroid disease       History reviewed. No pertinent surgical history.    Social History:     Social History  Substance Use Topics  . Smoking status: Unknown If Ever Smoked  . Smokeless tobacco: Never Used  . Alcohol use No     Lives - at [facility] - unable to assess, too confused to walk     Family History :    History reviewed. No pertinent family history. Unable to obtain due to dementia   Home Medications:   Prior to Admission medications   Medication Sig Start Date End Date Taking? Authorizing Provider  acetaminophen (TYLENOL) 325 MG tablet Take 2 tablets (650 mg total) by mouth every 6 (six) hours as needed for mild pain  or moderate pain. 08/25/16   Rosita Fire, MD  amLODipine (NORVASC) 10 MG tablet Place 10 mg into feeding tube daily.    [provider]  aspirin EC 81 MG tablet Take 81 mg by mouth daily.    [provider]  atorvastatin (LIPITOR) 20 MG tablet Place 20 mg into feeding tube daily.    [provider]  cholecalciferol (VITAMIN D) 1000 units tablet Place 1,000 Units into feeding tube daily.    [provider]  famotidine (PEPCID) 20 MG tablet Place 20 mg into feeding tube daily.    [provider]  fluticasone (FLONASE) 50 MCG/ACT nasal spray Place 2 sprays into both nostrils daily.    [provider]  hydrALAZINE (APRESOLINE) 25 MG tablet Place 25 mg into feeding tube 3 (three) times daily.    [provider]  levothyroxine (SYNTHROID, LEVOTHROID) 50 MCG tablet Place 50 mcg into feeding tube daily before breakfast.    [provider]  magnesium oxide (MAG-OX) 400 MG tablet Place 400 mg into feeding tube daily.    [provider]  Multiple Vitamin (MULTIVITAMIN WITH MINERALS) TABS tablet Place 1 tablet into feeding tube daily.    [provider]  nystatin (MYCOSTATIN/NYSTOP) powder Apply topically 2 (two) times  daily as needed (for redness).    [provider]  Wheat Dextrin (BENEFIBER PO) 10 mLs by Gastric Tube route daily.    [provider]     Allergies:     Allergies  Allergen Reactions  . Cymbalta [Duloxetine Hcl] Other (See Comments)    unknown  . Effexor [Venlafaxine] Other (See Comments)    unknown  . Morphine And Related Other (See Comments)    unknown  . Tape     adhesive     Physical Exam:   Vitals  Blood pressure (!) 156/64, pulse 61, temperature 98.9 F (37.2 C), temperature source Rectal, resp. rate 18, SpO2 93 %.   1. General  lying in bed in NAD,    2. Normal affect and insight, Not Suicidal or Homicidal, Awake Alert, Oriented X 1  3. No F.N deficits, ALL C.Nerves Intact, Strength 5/5 all 4 extremities, Sensation intact all 4 extremities, Plantars down going.  4. Ears and Eyes appear Normal, Conjunctivae clear, PERRLA. Moist Oral Mucosa.  5. Supple Neck, No JVD, No cervical lymphadenopathy appriciated, No Carotid Bruits.  6. Symmetrical Chest wall movement, Good air movement bilaterally, CTAB.  7. RRR, No Gallops, Rubs or Murmurs, No Parasternal Heave.  8. Positive Bowel Sounds, Abdomen Soft, No tenderness, No organomegaly appriciated,No rebound -guarding or rigidity.  9.  No Cyanosis, Normal Skin Turgor,+ rash in the groin area  10. Good muscle tone,  joints appear normal , no effusions, Normal ROM.  11. No Palpable Lymph Nodes in Neck or Axillae     Data Review:    CBC  Recent Labs Lab 11/15/16 1849  WBC 8.9  HGB 12.0  HCT 35.7*  PLT 221  MCV 92.0  MCH 30.9  MCHC 33.6  RDW 13.4  LYMPHSABS 1.9  MONOABS 0.6  EOSABS 0.1  BASOSABS 0.0   ------------------------------------------------------------------------------------------------------------------  Chemistries   Recent Labs Lab 11/15/16 1849  NA 129*  K 5.2*  CL 97*  CO2 23  GLUCOSE 552*  BUN 44*  CREATININE 1.74*  CALCIUM 9.2    ------------------------------------------------------------------------------------------------------------------ CrCl cannot be calculated (Unknown ideal weight.). ------------------------------------------------------------------------------------------------------------------ No results for input(s): TSH, T4TOTAL, T3FREE, THYROIDAB in the last 72 hours.  Invalid input(s): FREET3  Coagulation profile No results for input(s): INR, PROTIME in the last 168 hours. ------------------------------------------------------------------------------------------------------------------- No results for input(s): DDIMER in the last 72 hours. -------------------------------------------------------------------------------------------------------------------  Cardiac Enzymes No results for input(s): CKMB, TROPONINI, MYOGLOBIN in the last 168 hours.  Invalid input(s): CK ------------------------------------------------------------------------------------------------------------------ No results found for: BNP   ---------------------------------------------------------------------------------------------------------------  Urinalysis    Component Value Date/Time   COLORURINE YELLOW 11/15/2016 2000   APPEARANCEUR TURBID (A) 11/15/2016 2000   LABSPEC 1.018 11/15/2016 2000   PHURINE 5.0 11/15/2016 2000   GLUCOSEU >=500 (A) 11/15/2016 2000   HGBUR NEGATIVE 11/15/2016 2000   BILIRUBINUR NEGATIVE 11/15/2016 2000   KETONESUR NEGATIVE 11/15/2016 2000   PROTEINUR 100 (A) 11/15/2016 2000   NITRITE POSITIVE (A) 11/15/2016 2000   LEUKOCYTESUR LARGE (A) 11/15/2016 2000    ----------------------------------------------------------------------------------------------------------------   Imaging Results:    Dg Chest 2 View  Result Date: 11/15/2016 CLINICAL DATA:  Chest pain and weakness. EXAM: CHEST  2 VIEW COMPARISON:  08/24/2016 and 08/19/2016 radiographs FINDINGS: Cardiomegaly and mild interstitial  prominence again noted. There is no evidence of focal airspace disease, pulmonary edema, suspicious pulmonary nodule/mass, pleural effusion, or pneumothorax. No acute bony abnormalities are identified. IMPRESSION: No evidence of acute cardiopulmonary disease. Cardiomegaly and chronic interstitial prominence. Electronically Signed   By: Margarette Canada M.D.   On: 11/15/2016 19:27     Assessment & Plan:    Principal Problem:   UTI (urinary tract infection) Active Problems:   Diabetes mellitus without complication ([place])   ARF (acute renal failure) (HCC)   Hyperglycemia    AMS secondary to UTI / dementia CT brain Await urine culture tx with rocephin  Hyperglycemia w/o DKA, /Dm2 Ns iv,  fsbs q4h  ARF Likely prerenal Avoid nsaids, avoid iv dye Hydrate with ns iv Check cmp in am  Hyponatremia Check cmp in am  Rash Tinea cruris Nystatin powder   DVT Prophylaxis Heparin -   SCDs   AM Labs Ordered, also please review Full Orders  Family Communication: Admission, patients condition and plan of care including tests being ordered have been discussed with the patient and daughter who indicate understanding and agree with the plan and Code Status.  Code Status DNR  Likely DC to  ALF  Condition GUARDED    Consults called: none  Admission status: inpatient   Time spent in minutes : 45    Jani Gravel M.D on 11/15/2016 at 8:50 PM  Between 7am to 7pm - Pager - [phone]. After 7pm go to [email or web address] - password [facility]  [facility] - Office  [phone]

## 2016-11-15 NOTE — ED Provider Notes (Signed)
[facility] DEPT Provider Note   CSN: [id number] Arrival date & time: 11/15/16  1836     History   Chief Complaint Chief Complaint  Patient presents with  . Altered Mental Status   Level V caveat due to dementia HPI Regina Bush is a 83 y.o. female.  HPI Patient brought in for mental status changes. Reportedly has been more confused. States she just doesn't feel good. Dr. Karie Kirks is her son-in-law and called to tell me she is on the way. Denies fall. Complaining of mild right shoulder pain. States she no she did not fall however. Has had similar symptoms in the past with urinary tract infections. Past Medical History:  Diagnosis Date  . Anemia   . Arthritis   . Cerebral infarction ([place])   . Coronary artery disease   . Dementia   . Diabetes mellitus without complication (HCC)    Type 2  . Dysphagia due to recent cerebral infarction   . Failure to thrive (0-17)   . Hypercalcemia   . Hypertension   . Pneumonitis   . Protein calorie malnutrition ([place])   . Subdural hematoma ([place])   . Thyroid disease     Patient Active Problem List   Diagnosis Date Noted  . UTI (urinary tract infection) 11/15/2016  . ARF (acute renal failure) ([place]) 11/15/2016  . Hyperglycemia 11/15/2016  . Hyponatremia 11/15/2016  . HCAP (healthcare-associated pneumonia) 08/19/2016  . Subdural hematoma ([place])   . Hypertension   . Diabetes mellitus without complication ([place])   . Coronary artery disease     History reviewed. No pertinent surgical history.  OB History    No data available       Home Medications    Prior to Admission medications   Medication Sig Start Date End Date Taking? Authorizing Provider  acetaminophen (TYLENOL) 325 MG tablet Take 2 tablets (650 mg total) by mouth every 6 (six) hours as needed for mild pain or moderate pain. 08/25/16  Yes Rosita Fire, MD  amLODipine (NORVASC) 10 MG tablet Place 10 mg into feeding tube daily.   Yes [provider]  aspirin EC  81 MG tablet Take 81 mg by mouth daily.   Yes [provider]  atorvastatin (LIPITOR) 20 MG tablet Place 20 mg into feeding tube daily.   Yes [provider]  cholecalciferol (VITAMIN D) 1000 units tablet Place 1,000 Units into feeding tube daily.   Yes [provider]  famotidine (PEPCID) 20 MG tablet Place 20 mg into feeding tube daily.   Yes [provider]  fluticasone (FLONASE) 50 MCG/ACT nasal spray Place 2 sprays into both nostrils daily.   Yes [provider]  hydrALAZINE (APRESOLINE) 25 MG tablet Place 25 mg into feeding tube 3 (three) times daily.   Yes [provider]  levothyroxine (SYNTHROID, LEVOTHROID) 50 MCG tablet Place 50 mcg into feeding tube daily before breakfast.   Yes [provider]  magnesium oxide (MAG-OX) 400 MG tablet Place 400 mg into feeding tube daily.   Yes [provider]  Multiple Vitamin (MULTIVITAMIN WITH MINERALS) TABS tablet Place 1 tablet into feeding tube daily.   Yes [provider]  nystatin (MYCOSTATIN/NYSTOP) powder Apply topically 2 (two) times daily as needed (for redness).   Yes [provider]  traZODone (DESYREL) 50 MG tablet Take 50 mg by mouth at bedtime.   Yes [provider]  Wheat Dextrin (BENEFIBER PO) 10 mLs by Gastric Tube route daily.   Yes [provider]  Family History Family History  Problem Relation Age of Onset  . Family history unknown: Yes    Social History Social History  Substance Use Topics  . Smoking status: Unknown If Ever Smoked  . Smokeless tobacco: Never Used  . Alcohol use No     Allergies   Cymbalta [duloxetine hcl]; Effexor [venlafaxine]; Morphine and related; and Tape   Review of Systems Review of Systems  Unable to perform ROS: Dementia     Physical Exam Updated Vital Signs BP (!) 145/41 (BP Location: Left Arm)   Pulse (!) 57   Temp 98.4 F (36.9 C) (Oral)   Resp 18   Wt 70.2 kg (154  lb 12.8 oz)   SpO2 96%   BMI 28.31 kg/m   Physical Exam  Constitutional: She appears well-developed.  HENT:  Head: Normocephalic and atraumatic.  Eyes: EOM are normal.  Neck: Neck supple.  Cardiovascular: Normal rate.   Pulmonary/Chest: She has no wheezes.  Abdominal: There is no tenderness.  Musculoskeletal: She exhibits no tenderness.  Good range of motion right shoulder without clear tenderness or deformity.  Neurological: She is alert.  Patient awake and pleasant but has mild confusion.  Skin: Skin is warm. Capillary refill takes less than 2 seconds.     ED Treatments / Results  Labs (all labs ordered are listed, but only abnormal results are displayed) Labs Reviewed  URINALYSIS, ROUTINE W REFLEX MICROSCOPIC - Abnormal; Notable for the following:       Result Value   APPearance TURBID (*)    Glucose, UA >=500 (*)    Protein, ur 100 (*)    Nitrite POSITIVE (*)    Leukocytes, UA LARGE (*)    Bacteria, UA MANY (*)    Squamous Epithelial / LPF 0-5 (*)    Non Squamous Epithelial 0-5 (*)    All other components within normal limits  CBC WITH DIFFERENTIAL/PLATELET - Abnormal; Notable for the following:    HCT 35.7 (*)    All other components within normal limits  BASIC METABOLIC PANEL - Abnormal; Notable for the following:    Sodium 129 (*)    Potassium 5.2 (*)    Chloride 97 (*)    Glucose, Bld 552 (*)    BUN 44 (*)    Creatinine, Ser 1.74 (*)    GFR calc non Af Amer 25 (*)    GFR calc Af Amer 29 (*)    All other components within normal limits  GLUCOSE, CAPILLARY - Abnormal; Notable for the following:    Glucose-Capillary 368 (*)    All other components within normal limits  URINE CULTURE  CBC  COMPREHENSIVE METABOLIC PANEL  HEMOGLOBIN A1C    EKG  EKG Interpretation None       Radiology Dg Chest 2 View  Result Date: 11/15/2016 CLINICAL DATA:  Chest pain and weakness. EXAM: CHEST  2 VIEW COMPARISON:  08/24/2016 and 08/19/2016 radiographs FINDINGS:  Cardiomegaly and mild interstitial prominence again noted. There is no evidence of focal airspace disease, pulmonary edema, suspicious pulmonary nodule/mass, pleural effusion, or pneumothorax. No acute bony abnormalities are identified. IMPRESSION: No evidence of acute cardiopulmonary disease. Cardiomegaly and chronic interstitial prominence. Electronically Signed   By: Margarette Canada M.D.   On: 11/15/2016 19:27   Dg Shoulder Right  Result Date: 11/15/2016 CLINICAL DATA:  Fall, pain in the shoulders EXAM: RIGHT SHOULDER - 2+ VIEW COMPARISON:  None. FINDINGS: No fracture or dislocation. Mild inferior glenohumeral degenerative change. Narrowing of the subacromial space.  Right lung apex is clear IMPRESSION: 1. No fracture or dislocation 2. Narrowed appearance of the subacromial space suggesting rotator cuff pathology Electronically Signed   By: Donavan Foil M.D.   On: 11/15/2016 23:26   Ct Head Wo Contrast  Result Date: 11/15/2016 CLINICAL DATA:  84 y/o  F; altered mental status. EXAM: CT HEAD WITHOUT CONTRAST TECHNIQUE: Contiguous axial images were obtained from the base of the skull through the vertex without intravenous contrast. COMPARISON:  None. FINDINGS: Brain: Small chronic cortical infarcts in right posterior frontal lobe, right parietal lobe, in chronic infarction of the right lateral temporal lobe. Small chronic lacunar infarct within left cerebellar hemisphere. Moderate chronic microvascular ischemic changes of white matter parenchymal volume loss of the brain. No large acute infarct, intracranial hemorrhage, or focal mass effect. No hydrocephalus or effacement of basilar cisterns. No extra-axial collection identified. Vascular: Calcific atherosclerosis of carotid siphons. Skull: Normal. Negative for fracture or focal lesion. Sinuses/Orbits: Bilateral mastoid effusions. Normal aeration of paranasal sinuses. Bilateral intra-ocular lens replacement. Other: 12 mm nodule in the left occipital region scalp,  direct visualization recommended. IMPRESSION: 1. No acute intracranial abnormality identified. 2. 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 3. Moderate for age chronic microvascular ischemic changes and parenchymal volume loss of the brain. 4. Bilateral mastoid effusions. 5. 12 mm nodule left suboccipital scalp, direct visualization recommended, probably sebaceous cyst. Electronically Signed   By: Kristine Garbe M.D.   On: 11/15/2016 23:10   Dg Shoulder Left  Result Date: 11/15/2016 CLINICAL DATA:  Fall, history of arthritis EXAM: LEFT SHOULDER - 2+ VIEW COMPARISON:  None. FINDINGS: No acute displaced fracture or malalignment. AC joint degenerative change and minimal inferior glenohumeral degenerative change. IMPRESSION: Minimal degenerative changes.  No acute osseous abnormality Electronically Signed   By: Donavan Foil M.D.   On: 11/15/2016 23:25    Procedures Procedures (including critical care time)  Medications Ordered in ED Medications  fluticasone (FLONASE) 50 MCG/ACT nasal spray 2 spray (not administered)  amLODipine (NORVASC) tablet 10 mg (not administered)  aspirin EC tablet 81 mg (not administered)  atorvastatin (LIPITOR) tablet 20 mg (not administered)  famotidine (PEPCID) tablet 20 mg (not administered)  hydrALAZINE (APRESOLINE) tablet 25 mg (25 mg Per Tube Not Given 11/15/16 2300)  levothyroxine (SYNTHROID, LEVOTHROID) tablet 50 mcg (not administered)  magnesium oxide (MAG-OX) tablet 400 mg (not administered)  multivitamin with minerals tablet 1 tablet (not administered)  BENEFIBER POWD (not administered)  heparin injection 5,000 Units (not administered)  0.9 %  sodium chloride infusion (75 mLs Intravenous New Bag/Given 11/15/16 2331)  acetaminophen (TYLENOL) tablet 650 mg (not administered)    Or  acetaminophen (TYLENOL) suppository 650 mg (not administered)  cefTRIAXone (ROCEPHIN) 1 g in  dextrose 5 % 50 mL IVPB (1 g Intravenous New Bag/Given 11/15/16 2339)  insulin aspart (novoLOG) injection 0-9 Units (not administered)  insulin aspart (novoLOG) injection 0-9 Units (not administered)  nystatin (MYCOSTATIN/NYSTOP) topical powder (1 g Topical Given 11/15/16 2339)  hydrALAZINE (APRESOLINE) injection 5 mg (not administered)  cholecalciferol (D-VI-SOL) 400 UNIT/ML oral liquid 1,000 Units (not administered)  sodium chloride 0.9 % bolus 500 mL (500 mLs Intravenous New Bag/Given 11/15/16 2121)     Initial Impression / Assessment and Plan / ED Course  I have reviewed the triage vital signs and the nursing notes.  Pertinent labs & imaging results that were available during my care of the patient were reviewed by me and considered  in my medical decision making (see chart for details).     Patient presents with altered mental status. Has worsening kidney function, sugar of 500 and urinary tract infection. Will admit to internal medicine.  Final Clinical Impressions(s) / ED Diagnoses   Final diagnoses:  Lower urinary tract infectious disease  AKI (acute kidney injury) ([place])  Hyperglycemia    New Prescriptions Current Discharge Medication List       Davonna Belling, MD 11/16/16 0001

## 2016-11-15 NOTE — ED Notes (Signed)
Patient transported to X-ray 

## 2016-11-16 ENCOUNTER — Inpatient Hospital Stay (HOSPITAL_COMMUNITY): Payer: Medicare Other

## 2016-11-16 ENCOUNTER — Encounter (HOSPITAL_COMMUNITY): Payer: Self-pay | Admitting: *Deleted

## 2016-11-16 LAB — GLUCOSE, CAPILLARY
GLUCOSE-CAPILLARY: 218 mg/dL — AB (ref 65–99)
GLUCOSE-CAPILLARY: 143 mg/dL — AB (ref 65–99)
Glucose-Capillary: 162 mg/dL — ABNORMAL HIGH (ref 65–99)
Glucose-Capillary: 137 mg/dL — ABNORMAL HIGH (ref 65–99)
Glucose-Capillary: 333 mg/dL — ABNORMAL HIGH (ref 65–99)

## 2016-11-16 LAB — COMPREHENSIVE METABOLIC PANEL
ALBUMIN: 3 g/dL — AB (ref 3.5–5.0)
ALT: 12 U/L — ABNORMAL LOW (ref 14–54)
ANION GAP: 7 (ref 5–15)
AST: 10 U/L — ABNORMAL LOW (ref 15–41)
Alkaline Phosphatase: 94 U/L (ref 38–126)
BUN: 35 mg/dL — ABNORMAL HIGH (ref 6–20)
CHLORIDE: 104 mmol/L (ref 101–111)
CO2: 26 mmol/L (ref 22–32)
Calcium: 9.2 mg/dL (ref 8.9–10.3)
Creatinine, Ser: 1.19 mg/dL — ABNORMAL HIGH (ref 0.44–1.00)
GFR calc non Af Amer: 40 mL/min — ABNORMAL LOW (ref >60)
GFR, EST AFRICAN AMERICAN: 46 mL/min — AB (ref >60)
GLUCOSE: 205 mg/dL — AB (ref 65–99)
POTASSIUM: 3.8 mmol/L (ref 3.5–5.1)
Sodium: 137 mmol/L (ref 135–145)
Total Bilirubin: 0.3 mg/dL (ref 0.3–1.2)
Total Protein: 5.8 g/dL — ABNORMAL LOW (ref 6.5–8.1)

## 2016-11-16 LAB — CBC
HEMATOCRIT: 35.4 % — AB (ref 36.0–46.0)
Hemoglobin: 11.8 g/dL — ABNORMAL LOW (ref 12.0–15.0)
MCH: 30.4 pg (ref 26.0–34.0)
MCHC: 33.3 g/dL (ref 30.0–36.0)
MCV: 91.2 fL (ref 78.0–100.0)
Platelets: 206 10*3/uL (ref 150–400)
RBC: 3.88 MIL/uL (ref 3.87–5.11)
RDW: 13.1 % (ref 11.5–15.5)
WBC: 8.4 10*3/uL (ref 4.0–10.5)

## 2016-11-16 LAB — HEMOGLOBIN A1C
Hgb A1c MFr Bld: 12 % — ABNORMAL HIGH (ref 4.8–5.6)
MEAN PLASMA GLUCOSE: 297.7 mg/dL

## 2016-11-16 LAB — MRSA PCR SCREENING: MRSA BY PCR: POSITIVE — AB

## 2016-11-16 MED ORDER — INSULIN ASPART 100 UNIT/ML ~~LOC~~ SOLN
7.0000 [IU] | Freq: Once | SUBCUTANEOUS | Status: AC
  Administered 2016-11-16: 7 [IU] via SUBCUTANEOUS

## 2016-11-16 MED ORDER — CHLORHEXIDINE GLUCONATE CLOTH 2 % EX PADS
6.0000 | MEDICATED_PAD | Freq: Every day | CUTANEOUS | Status: DC
  Administered 2016-11-16 – 2016-11-18 (×3): 6 via TOPICAL

## 2016-11-16 MED ORDER — MUPIROCIN 2 % EX OINT
1.0000 "application " | TOPICAL_OINTMENT | Freq: Two times a day (BID) | CUTANEOUS | Status: DC
  Administered 2016-11-16 – 2016-11-19 (×6): 1 via NASAL
  Filled 2016-11-16 (×2): qty 22

## 2016-11-16 MED ORDER — PNEUMOCOCCAL VAC POLYVALENT 25 MCG/0.5ML IJ INJ: 0.5000 mL | INJECTION | INTRAMUSCULAR | Status: DC

## 2016-11-16 MED ORDER — CHOLECALCIFEROL 10 MCG (400 UNIT) PO TABS
400.0000 [IU] | ORAL_TABLET | Freq: Every day | ORAL | Status: DC
  Administered 2016-11-16 – 2016-11-19 (×3): 400 [IU] via ORAL
  Filled 2016-11-16 (×4): qty 1

## 2016-11-16 MED ORDER — TRAZODONE HCL 50 MG PO TABS
50.0000 mg | ORAL_TABLET | Freq: Every day | ORAL | Status: DC
  Administered 2016-11-16 – 2016-11-18 (×3): 50 mg via ORAL
  Filled 2016-11-16 (×3): qty 1

## 2016-11-16 NOTE — Clinical Social Work Note (Signed)
Clinical Social Work Assessment  Patient Details  Name: Regina Bush MRN: [id number] Date of Birth: [birth year]  Date of referral:  11/16/16               Reason for consult:  Discharge Planning                Permission sought to share information with:    Permission granted to share information::     Name::        Agency::     Relationship::     Contact Information:  Dtr. Regina Bush.   Housing/Transportation Living arrangements for the past 2 months:  [place] of Information:  Adult Children Patient Interpreter Needed:  None Criminal Activity/Legal Involvement Pertinent to Current Situation/Hospitalization:  No - Comment as needed Significant Relationships:  Adult Children Lives with:  Facility Resident Do you feel safe going back to the place where you live?  Yes Need for family participation in patient care:  Yes (Comment)  Care giving concerns:  None identified at discharge. ALF resident.    Social Worker assessment / plan: Patient's daughter, Regina Bush, provided history. Patient has been at [facility] for approximately five months.  Patient uses a wheelchair, self propels and goes on outings with the facility.  Patient's ADLs are completed by facility staff and patient is able to feed herself.  Patient and family are agreeable for patient to return to [facility] at discharge.   Employment status:  Retired Forensic scientist:  Medicare PT Recommendations:  Not assessed at this time Information / Referral to community resources:     Patient/Family's Response to care:  Patient and family are agreeable for patient to return to ALF at discharge.   Patient/Family's Understanding of and Emotional Response to Diagnosis, Current Treatment, and Prognosis:  Patient and family understand her diagnosis,   Emotional Assessment Appearance:  Appears stated age Attitude/Demeanor/Rapport:    Affect (typically observed):  Accepting Orientation:     Alcohol / Substance use:  Not Applicable Psych involvement (Current and /or in the community):     Discharge Needs  Concerns to be addressed:  No discharge needs identified (Return to [place] ) Readmission within the last 30 days:  No Current discharge risk:  None Barriers to Discharge:  No Barriers Identified   Ihor Gully, LCSW 11/16/2016, 11:05 AM

## 2016-11-16 NOTE — Progress Notes (Signed)
Inpatient Diabetes Program Recommendations  AACE/ADA: New Consensus Statement on Inpatient Glycemic Control (2015)  Target Ranges:  Prepandial:   less than 140 mg/dL      Peak postprandial:   less than 180 mg/dL (1-2 hours)      Critically ill patients:  140 - 180 mg/dL   Results for Regina Bush, Regina Bush (MRN [id number]) as of 11/16/2016 13:18  Ref. Range 11/15/2016 23:36 11/16/2016 04:29 11/16/2016 07:30 11/16/2016 11:17  Glucose-Capillary Latest Ref Range: 65 - 99 mg/dL 368 (H) 218 (H) 143 (H) 162 (H)   Results for Regina Bush, Regina Bush (MRN [id number]) as of 11/16/2016 13:18  Ref. Range 08/20/2016 05:50 11/15/2016 18:49  Hemoglobin A1C Latest Ref Range: 4.8 - 5.6 % 9.9 (H) 12.0 (H)    Admit with: AMS/ UTI/ Hyperglycemia  History: DM, Dementia  ALF DM Meds: None listed  Current Insulin Orders: Novolog Sensitive Correction Scale/ SSI (0-9 units) TID AC       MD- Note Glucose 552 mg/dl on admission.  Note improvement of CBGs with IVF and Insulin.  Patient not taking any medications at ALF for blood sugar management.  Current A1c= 12% (average glucose 297 mg/dl for the last 3 months).   MD- Patient likely needs DM medications at time of discharge.  Hopefully ALF can provide medications to patient?  With her A1c being so elevated, may benefit from insulin?        --Will follow patient during hospitalization--  Wyn Quaker RN, MSN, CDE Diabetes Coordinator Inpatient Glycemic Control Team Team Pager: [phone] (8a-5p)

## 2016-11-16 NOTE — Progress Notes (Signed)
Subjective: She is more alert than on admission. She came in with urinary tract infection and altered mental status from that. She is being treated and seems a little better. She says she wants to eat.  Objective: Vital signs in last 24 hours: Temp:  [97.7 F (36.5 C)-98.9 F (37.2 C)] 97.7 F (36.5 C) (09/04 1638) Pulse Rate:  [54-61] 54 (09/04 0638) Resp:  [18] 18 (09/04 0638) BP: (132-156)/(41-64) 132/43 (09/04 0638) SpO2:  [93 %-96 %] 94 % (09/04 4536) Weight:  [70.1 kg (154 lb 8.7 oz)-70.2 kg (154 lb 12.8 oz)] 70.1 kg (154 lb 8.7 oz) (09/04 4680) Weight change:     Intake/Output from previous day: No intake/output data recorded.  PHYSICAL EXAM General appearance: alert, cooperative and no distress Resp: clear to auscultation bilaterally Cardio: regular rate and rhythm, S1, S2 normal, no murmur, click, rub or gallop GI: soft, non-tender; bowel sounds normal; no masses,  no organomegaly Extremities: extremities normal, atraumatic, no cyanosis or edema Skin warm and dry  Lab Results:  Results for orders placed or performed during the hospital encounter of 11/15/16 (from the past 48 hour(s))  CBC with Differential     Status: Abnormal   Collection Time: 11/15/16  6:49 PM  Result Value Ref Range   WBC 8.9 4.0 - 10.5 K/uL   RBC 3.88 3.87 - 5.11 MIL/uL   Hemoglobin 12.0 12.0 - 15.0 g/dL   HCT 35.7 (L) 36.0 - 46.0 %   MCV 92.0 78.0 - 100.0 fL   MCH 30.9 26.0 - 34.0 pg   MCHC 33.6 30.0 - 36.0 g/dL   RDW 13.4 11.5 - 15.5 %   Platelets 221 150 - 400 K/uL   Neutrophils Relative % 71 %   Neutro Abs 6.2 1.7 - 7.7 K/uL   Lymphocytes Relative 21 %   Lymphs Abs 1.9 0.7 - 4.0 K/uL   Monocytes Relative 7 %   Monocytes Absolute 0.6 0.1 - 1.0 K/uL   Eosinophils Relative 1 %   Eosinophils Absolute 0.1 0.0 - 0.7 K/uL   Basophils Relative 0 %   Basophils Absolute 0.0 0.0 - 0.1 K/uL  Basic metabolic panel     Status: Abnormal   Collection Time: 11/15/16  6:49 PM  Result Value Ref  Range   Sodium 129 (L) 135 - 145 mmol/L   Potassium 5.2 (H) 3.5 - 5.1 mmol/L   Chloride 97 (L) 101 - 111 mmol/L   CO2 23 22 - 32 mmol/L   Glucose, Bld 552 (HH) 65 - 99 mg/dL    Comment: CRITICAL RESULT CALLED TO, READ BACK BY AND VERIFIED WITH: LURZ,J AT 1949 ON 9.3.2018 BY ISLEY,B    BUN 44 (H) 6 - 20 mg/dL   Creatinine, Ser 1.74 (H) 0.44 - 1.00 mg/dL   Calcium 9.2 8.9 - 10.3 mg/dL   GFR calc non Af Amer 25 (L) >60 mL/min   GFR calc Af Amer 29 (L) >60 mL/min    Comment: (NOTE) The eGFR has been calculated using the CKD EPI equation. This calculation has not been validated in all clinical situations. eGFR's persistently <60 mL/min signify possible Chronic Kidney Disease.    Anion gap 9 5 - 15  Urinalysis, Routine w reflex microscopic     Status: Abnormal   Collection Time: 11/15/16  8:00 PM  Result Value Ref Range   Color, Urine YELLOW YELLOW   APPearance TURBID (A) CLEAR   Specific Gravity, Urine 1.018 1.005 - 1.030   pH 5.0 5.0 -  8.0   Glucose, UA >=500 (A) NEGATIVE mg/dL   Hgb urine dipstick NEGATIVE NEGATIVE   Bilirubin Urine NEGATIVE NEGATIVE   Ketones, ur NEGATIVE NEGATIVE mg/dL   Protein, ur 100 (A) NEGATIVE mg/dL   Nitrite POSITIVE (A) NEGATIVE   Leukocytes, UA LARGE (A) NEGATIVE   RBC / HPF 6-30 0 - 5 RBC/hpf   WBC, UA TOO NUMEROUS TO COUNT 0 - 5 WBC/hpf   Bacteria, UA MANY (A) NONE SEEN   Squamous Epithelial / LPF 0-5 (A) NONE SEEN   WBC Clumps PRESENT    Budding Yeast PRESENT    Non Squamous Epithelial 0-5 (A) NONE SEEN  Glucose, capillary     Status: Abnormal   Collection Time: 11/15/16 11:36 PM  Result Value Ref Range   Glucose-Capillary 368 (H) 65 - 99 mg/dL  MRSA PCR Screening     Status: Abnormal   Collection Time: 11/16/16  3:51 AM  Result Value Ref Range   MRSA by PCR POSITIVE (A) NEGATIVE    Comment:        The GeneXpert MRSA Assay (FDA approved for NASAL specimens only), is one component of a comprehensive MRSA colonization surveillance  program. It is not intended to diagnose MRSA infection nor to guide or monitor treatment for MRSA infections. RESULT CALLED TO, READ BACK BY AND VERIFIED WITH: HAIGHT Z. AT 0626A ON [id number] BY THOMPSON S.   Glucose, capillary     Status: Abnormal   Collection Time: 11/16/16  4:29 AM  Result Value Ref Range   Glucose-Capillary 218 (H) 65 - 99 mg/dL   Comment 1 Notify RN    Comment 2 Document in Chart   CBC     Status: Abnormal   Collection Time: 11/16/16  4:37 AM  Result Value Ref Range   WBC 8.4 4.0 - 10.5 K/uL   RBC 3.88 3.87 - 5.11 MIL/uL   Hemoglobin 11.8 (L) 12.0 - 15.0 g/dL   HCT 35.4 (L) 36.0 - 46.0 %   MCV 91.2 78.0 - 100.0 fL   MCH 30.4 26.0 - 34.0 pg   MCHC 33.3 30.0 - 36.0 g/dL   RDW 13.1 11.5 - 15.5 %   Platelets 206 150 - 400 K/uL  Comprehensive metabolic panel     Status: Abnormal   Collection Time: 11/16/16  4:37 AM  Result Value Ref Range   Sodium 137 135 - 145 mmol/L    Comment: DELTA CHECK NOTED   Potassium 3.8 3.5 - 5.1 mmol/L    Comment: DELTA CHECK NOTED   Chloride 104 101 - 111 mmol/L   CO2 26 22 - 32 mmol/L   Glucose, Bld 205 (H) 65 - 99 mg/dL   BUN 35 (H) 6 - 20 mg/dL   Creatinine, Ser 1.19 (H) 0.44 - 1.00 mg/dL   Calcium 9.2 8.9 - 10.3 mg/dL   Total Protein 5.8 (L) 6.5 - 8.1 g/dL   Albumin 3.0 (L) 3.5 - 5.0 g/dL   AST 10 (L) 15 - 41 U/L   ALT 12 (L) 14 - 54 U/L   Alkaline Phosphatase 94 38 - 126 U/L   Total Bilirubin 0.3 0.3 - 1.2 mg/dL   GFR calc non Af Amer 40 (L) >60 mL/min   GFR calc Af Amer 46 (L) >60 mL/min    Comment: (NOTE) The eGFR has been calculated using the CKD EPI equation. This calculation has not been validated in all clinical situations. eGFR's persistently <60 mL/min signify possible Chronic Kidney Disease.  Anion gap 7 5 - 15    ABGS No results for input(s): PHART, PO2ART, TCO2, HCO3 in the last 72 hours.  Invalid input(s): PCO2 CULTURES Recent Results (from the past 240 hour(s))  MRSA PCR Screening     Status:  Abnormal   Collection Time: 11/16/16  3:51 AM  Result Value Ref Range Status   MRSA by PCR POSITIVE (A) NEGATIVE Final    Comment:        The GeneXpert MRSA Assay (FDA approved for NASAL specimens only), is one component of a comprehensive MRSA colonization surveillance program. It is not intended to diagnose MRSA infection nor to guide or monitor treatment for MRSA infections. RESULT CALLED TO, READ BACK BY AND VERIFIED WITH: HAIGHT Z. AT 0626A ON [id number] BY THOMPSON S.    Studies/Results: Dg Chest 2 View  Result Date: 11/15/2016 CLINICAL DATA:  Chest pain and weakness. EXAM: CHEST  2 VIEW COMPARISON:  08/24/2016 and 08/19/2016 radiographs FINDINGS: Cardiomegaly and mild interstitial prominence again noted. There is no evidence of focal airspace disease, pulmonary edema, suspicious pulmonary nodule/mass, pleural effusion, or pneumothorax. No acute bony abnormalities are identified. IMPRESSION: No evidence of acute cardiopulmonary disease. Cardiomegaly and chronic interstitial prominence. Electronically Signed   By: Margarette Canada M.D.   On: 11/15/2016 19:27   Dg Shoulder Right  Result Date: 11/15/2016 CLINICAL DATA:  Fall, pain in the shoulders EXAM: RIGHT SHOULDER - 2+ VIEW COMPARISON:  None. FINDINGS: No fracture or dislocation. Mild inferior glenohumeral degenerative change. Narrowing of the subacromial space. Right lung apex is clear IMPRESSION: 1. No fracture or dislocation 2. Narrowed appearance of the subacromial space suggesting rotator cuff pathology Electronically Signed   By: Donavan Foil M.D.   On: 11/15/2016 23:26   Ct Head Wo Contrast  Result Date: 11/15/2016 CLINICAL DATA:  83 y/o  F; altered mental status. EXAM: CT HEAD WITHOUT CONTRAST TECHNIQUE: Contiguous axial images were obtained from the base of the skull through the vertex without intravenous contrast. COMPARISON:  None. 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 Small chronic lacunar infarct within left cerebellar hemisphere. Moderate chronic microvascular ischemic changes of white matter parenchymal volume loss of the brain. No large acute infarct, intracranial hemorrhage, or focal mass effect. No hydrocephalus or effacement of basilar cisterns. No extra-axial collection identified. Vascular: Calcific atherosclerosis of carotid siphons. Skull: Normal. Negative for fracture or focal lesion. Sinuses/Orbits: Bilateral mastoid effusions. Normal aeration of paranasal sinuses. Bilateral intra-ocular lens replacement. Other: 12 mm nodule in the left occipital region scalp, direct visualization recommended. IMPRESSION: 1. No acute intracranial abnormality identified. 2. 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 3. Moderate for age chronic microvascular ischemic changes and parenchymal volume loss of the brain. 4. Bilateral mastoid effusions. 5. 12 mm nodule left suboccipital scalp, direct visualization recommended, probably sebaceous cyst. Electronically Signed   By: Kristine Garbe M.D.   On: 11/15/2016 23:10   Dg Shoulder Left  Result Date: 11/15/2016 CLINICAL DATA:  Fall, history of arthritis EXAM: LEFT SHOULDER - 2+ VIEW COMPARISON:  None. FINDINGS: No acute displaced fracture or malalignment. AC joint degenerative change and minimal inferior glenohumeral degenerative change. IMPRESSION: Minimal degenerative changes.  No acute osseous abnormality Electronically Signed   By: Donavan Foil M.D.   On: 11/15/2016 23:25    Medications:  Prior to Admission:  Prescriptions Prior  to Admission  Medication Sig Dispense Refill Last Dose  . acetaminophen (TYLENOL) 325 MG tablet Take 2 tablets (650 mg total) by mouth every 6 (six) hours as needed for mild pain or moderate pain. 60 tablet 3 11/14/2016 at Unknown time  . amLODipine  (NORVASC) 10 MG tablet Place 10 mg into feeding tube daily.   11/15/2016 at Unknown time  . aspirin EC 81 MG tablet Take 81 mg by mouth daily.   11/15/2016 at Unknown time  . atorvastatin (LIPITOR) 20 MG tablet Place 20 mg into feeding tube daily.   11/14/2016 at Unknown time  . cholecalciferol (VITAMIN D) 1000 units tablet Place 1,000 Units into feeding tube daily.   11/15/2016 at Unknown time  . famotidine (PEPCID) 20 MG tablet Place 20 mg into feeding tube daily.   11/15/2016 at Unknown time  . fluticasone (FLONASE) 50 MCG/ACT nasal spray Place 2 sprays into both nostrils daily.   11/15/2016 at Unknown time  . hydrALAZINE (APRESOLINE) 25 MG tablet Place 25 mg into feeding tube 3 (three) times daily.   11/15/2016 at Unknown time  . levothyroxine (SYNTHROID, LEVOTHROID) 50 MCG tablet Place 50 mcg into feeding tube daily before breakfast.   11/15/2016 at Unknown time  . magnesium oxide (MAG-OX) 400 MG tablet Place 400 mg into feeding tube daily.   11/15/2016 at Unknown time  . Multiple Vitamin (MULTIVITAMIN WITH MINERALS) TABS tablet Place 1 tablet into feeding tube daily.   11/15/2016 at Unknown time  . nystatin (MYCOSTATIN/NYSTOP) powder Apply topically 2 (two) times daily as needed (for redness).   unknown  . traZODone (DESYREL) 50 MG tablet Take 50 mg by mouth at bedtime.   11/14/2016 at Unknown time  . Wheat Dextrin (BENEFIBER PO) 10 mLs by Gastric Tube route daily.   11/15/2016 at Unknown time   Scheduled: . amLODipine  10 mg Per Tube Daily  . aspirin EC  81 mg Oral Daily  . atorvastatin  20 mg Per Tube Daily  . Chlorhexidine Gluconate Cloth  6 each Topical Q0600  . cholecalciferol  400 Units Oral Daily  . famotidine  20 mg Per Tube Daily  . fluticasone  2 spray Each Nare Daily  . heparin  5,000 Units Subcutaneous Q8H  . hydrALAZINE  25 mg Per Tube TID  . insulin aspart  0-9 Units Subcutaneous TID WC  . insulin aspart  0-9 Units Subcutaneous TID WC  . levothyroxine  50 mcg Per Tube QAC breakfast  .  magnesium oxide  400 mg Per Tube Daily  . multivitamin with minerals  1 tablet Per Tube Daily  . mupirocin ointment  1 application Nasal BID  . nystatin   Topical BID  . [START ON 11/17/2016] pneumococcal 23 valent vaccine  0.5 mL Intramuscular Tomorrow-1000  . psyllium  1 packet Per Tube Daily   Continuous: . sodium chloride 75 mL (11/15/16 2331)  . cefTRIAXone (ROCEPHIN)  IV Stopped (11/16/16 0009)   HWE:[id number] **OR** acetaminophen, hydrALAZINE  Assesment:She was admitted with presumed urinary tract infection and altered mental status from that. At baseline she has diabetes and her blood sugar was very high. She had low sodium likely related to her blood sugar. She had acute kidney injury and that is doing better. Her mental status is improved. Principal Problem:   UTI (urinary tract infection) Active Problems:   Diabetes mellitus without complication ([facility])   ARF (acute renal failure) ([facility])   Hyperglycemia   Hyponatremia    Plan: Continue current treatments  LOS: 1 day   Haedyn Ancrum L 11/16/2016, 8:37 AM

## 2016-11-17 DIAGNOSIS — N3941 Urge incontinence: Secondary | ICD-10-CM | POA: Diagnosis not present

## 2016-11-17 DIAGNOSIS — R350 Frequency of micturition: Secondary | ICD-10-CM | POA: Diagnosis not present

## 2016-11-17 DIAGNOSIS — N390 Urinary tract infection, site not specified: Secondary | ICD-10-CM | POA: Diagnosis not present

## 2016-11-17 DIAGNOSIS — R309 Painful micturition, unspecified: Secondary | ICD-10-CM | POA: Diagnosis not present

## 2016-11-17 DIAGNOSIS — B9620 Unspecified Escherichia coli [E. coli] as the cause of diseases classified elsewhere: Secondary | ICD-10-CM | POA: Diagnosis not present

## 2016-11-17 LAB — GLUCOSE, CAPILLARY
GLUCOSE-CAPILLARY: 271 mg/dL — AB (ref 65–99)
GLUCOSE-CAPILLARY: 193 mg/dL — AB (ref 65–99)
Glucose-Capillary: 153 mg/dL — ABNORMAL HIGH (ref 65–99)
Glucose-Capillary: 297 mg/dL — ABNORMAL HIGH (ref 65–99)
Glucose-Capillary: 211 mg/dL — ABNORMAL HIGH (ref 65–99)
Glucose-Capillary: 201 mg/dL — ABNORMAL HIGH (ref 65–99)

## 2016-11-17 MED ORDER — INSULIN ASPART 100 UNIT/ML ~~LOC~~ SOLN
0.0000 [IU] | Freq: Three times a day (TID) | SUBCUTANEOUS | Status: DC
  Administered 2016-11-17: 3 [IU] via SUBCUTANEOUS
  Administered 2016-11-18: 2 [IU] via SUBCUTANEOUS
  Administered 2016-11-18: 3 [IU] via SUBCUTANEOUS
  Administered 2016-11-18: 5 [IU] via SUBCUTANEOUS
  Administered 2016-11-19 (×2): 3 [IU] via SUBCUTANEOUS

## 2016-11-17 MED ORDER — INSULIN GLARGINE 100 UNIT/ML ~~LOC~~ SOLN
10.0000 [IU] | Freq: Every day | SUBCUTANEOUS | Status: DC
  Administered 2016-11-18 – 2016-11-19 (×2): 10 [IU] via SUBCUTANEOUS
  Filled 2016-11-17 (×6): qty 0.1

## 2016-11-17 MED ORDER — INSULIN GLARGINE 100 UNIT/ML ~~LOC~~ SOLN
12.0000 [IU] | Freq: Every day | SUBCUTANEOUS | Status: DC
  Filled 2016-11-17: qty 0.12

## 2016-11-17 MED ORDER — INSULIN ASPART 100 UNIT/ML ~~LOC~~ SOLN: 0.0000 [IU] | Freq: Three times a day (TID) | SUBCUTANEOUS | Status: DC

## 2016-11-17 MED ORDER — LORAZEPAM 0.5 MG PO TABS
0.5000 mg | ORAL_TABLET | Freq: Three times a day (TID) | ORAL | Status: DC | PRN
  Administered 2016-11-17 – 2016-11-19 (×4): 0.5 mg via ORAL
  Filled 2016-11-17 (×4): qty 1

## 2016-11-17 MED ORDER — INSULIN ASPART 100 UNIT/ML ~~LOC~~ SOLN: 0.0000 [IU] | Freq: Every day | SUBCUTANEOUS | Status: DC

## 2016-11-17 NOTE — Progress Notes (Signed)
Received verbal order for PO Ativan 0.5 mg TID PRN from Dr. Luan Pulling.

## 2016-11-17 NOTE — Progress Notes (Signed)
Subjective: She says she feels better. No new complaints. Blood sugar elevation noted in her hemoglobin A1c is 12. I have instituted orders recommended by diabetes coordinator. This may be one of the reasons that she has recurrent urinary tract infections. We'll plan to continue other treatments.  Objective: Vital signs in last 24 hours: Temp:  [98 F (36.7 C)-98.4 F (36.9 C)] 98.4 F (36.9 C) (09/05 0535) Pulse Rate:  [57-61] 61 (09/05 0535) Resp:  [18-20] 18 (09/05 0535) BP: (141-178)/(40-60) 143/60 (09/05 0535) SpO2:  [95 %-96 %] 95 % (09/05 0535) Weight:  [70.1 kg (154 lb 9 oz)] 70.1 kg (154 lb 9 oz) (09/05 0543) Weight change: -0.107 kg (-3.8 oz) Last BM Date: 11/16/16  Intake/Output from previous day: 09/04 0701 - 09/05 0700 In: 240 [P.O.:240] Out: -   PHYSICAL EXAM General appearance: alert, cooperative and no distress Resp: clear to auscultation bilaterally Cardio: regular rate and rhythm, S1, S2 normal, no murmur, click, rub or gallop GI: soft, non-tender; bowel sounds normal; no masses,  no organomegaly Extremities: extremities normal, atraumatic, no cyanosis or edema Skin warm and dry  Lab Results:  Results for orders placed or performed during the hospital encounter of 11/15/16 (from the past 48 hour(s))  CBC with Differential     Status: Abnormal   Collection Time: 11/15/16  6:49 PM  Result Value Ref Range   WBC 8.9 4.0 - 10.5 K/uL   RBC 3.88 3.87 - 5.11 MIL/uL   Hemoglobin 12.0 12.0 - 15.0 g/dL   HCT 35.7 (L) 36.0 - 46.0 %   MCV 92.0 78.0 - 100.0 fL   MCH 30.9 26.0 - 34.0 pg   MCHC 33.6 30.0 - 36.0 g/dL   RDW 13.4 11.5 - 15.5 %   Platelets 221 150 - 400 K/uL   Neutrophils Relative % 71 %   Neutro Abs 6.2 1.7 - 7.7 K/uL   Lymphocytes Relative 21 %   Lymphs Abs 1.9 0.7 - 4.0 K/uL   Monocytes Relative 7 %   Monocytes Absolute 0.6 0.1 - 1.0 K/uL   Eosinophils Relative 1 %   Eosinophils Absolute 0.1 0.0 - 0.7 K/uL   Basophils Relative 0 %   Basophils  Absolute 0.0 0.0 - 0.1 K/uL  Basic metabolic panel     Status: Abnormal   Collection Time: 11/15/16  6:49 PM  Result Value Ref Range   Sodium 129 (L) 135 - 145 mmol/L   Potassium 5.2 (H) 3.5 - 5.1 mmol/L   Chloride 97 (L) 101 - 111 mmol/L   CO2 23 22 - 32 mmol/L   Glucose, Bld 552 (HH) 65 - 99 mg/dL    Comment: CRITICAL RESULT CALLED TO, READ BACK BY AND VERIFIED WITH: LURZ,J AT 1949 ON 9.3.2018 BY ISLEY,B    BUN 44 (H) 6 - 20 mg/dL   Creatinine, Ser 1.74 (H) 0.44 - 1.00 mg/dL   Calcium 9.2 8.9 - 10.3 mg/dL   GFR calc non Af Amer 25 (L) >60 mL/min   GFR calc Af Amer 29 (L) >60 mL/min    Comment: (NOTE) The eGFR has been calculated using the CKD EPI equation. This calculation has not been validated in all clinical situations. eGFR's persistently <60 mL/min signify possible Chronic Kidney Disease.    Anion gap 9 5 - 15  Hemoglobin A1c     Status: Abnormal   Collection Time: 11/15/16  6:49 PM  Result Value Ref Range   Hgb A1c MFr Bld 12.0 (H) 4.8 - 5.6 %  Comment: (NOTE) Pre diabetes:          5.7%-6.4% Diabetes:              >6.4% Glycemic control for   <7.0% adults with diabetes    Mean Plasma Glucose 297.7 mg/dL    Comment: Performed at [facility] [address] [place], [place] [address]  Urinalysis, Routine w reflex microscopic     Status: Abnormal   Collection Time: 11/15/16  8:00 PM  Result Value Ref Range   Color, Urine YELLOW YELLOW   APPearance TURBID (A) CLEAR   Specific Gravity, Urine 1.018 1.005 - 1.030   pH 5.0 5.0 - 8.0   Glucose, UA >=500 (A) NEGATIVE mg/dL   Hgb urine dipstick NEGATIVE NEGATIVE   Bilirubin Urine NEGATIVE NEGATIVE   Ketones, ur NEGATIVE NEGATIVE mg/dL   Protein, ur 100 (A) NEGATIVE mg/dL   Nitrite POSITIVE (A) NEGATIVE   Leukocytes, UA LARGE (A) NEGATIVE   RBC / HPF 6-30 0 - 5 RBC/hpf   WBC, UA TOO NUMEROUS TO COUNT 0 - 5 WBC/hpf   Bacteria, UA MANY (A) NONE SEEN   Squamous Epithelial / LPF 0-5 (A) NONE SEEN   WBC Clumps  PRESENT    Budding Yeast PRESENT    Non Squamous Epithelial 0-5 (A) NONE SEEN  Glucose, capillary     Status: Abnormal   Collection Time: 11/15/16 11:36 PM  Result Value Ref Range   Glucose-Capillary 368 (H) 65 - 99 mg/dL  MRSA PCR Screening     Status: Abnormal   Collection Time: 11/16/16  3:51 AM  Result Value Ref Range   MRSA by PCR POSITIVE (A) NEGATIVE    Comment:        The GeneXpert MRSA Assay (FDA approved for NASAL specimens only), is one component of a comprehensive MRSA colonization surveillance program. It is not intended to diagnose MRSA infection nor to guide or monitor treatment for MRSA infections. RESULT CALLED TO, READ BACK BY AND VERIFIED WITH: HAIGHT Z. AT 0626A ON [id number] BY THOMPSON S.   Glucose, capillary     Status: Abnormal   Collection Time: 11/16/16  4:29 AM  Result Value Ref Range   Glucose-Capillary 218 (H) 65 - 99 mg/dL   Comment 1 Notify RN    Comment 2 Document in Chart   CBC     Status: Abnormal   Collection Time: 11/16/16  4:37 AM  Result Value Ref Range   WBC 8.4 4.0 - 10.5 K/uL   RBC 3.88 3.87 - 5.11 MIL/uL   Hemoglobin 11.8 (L) 12.0 - 15.0 g/dL   HCT 35.4 (L) 36.0 - 46.0 %   MCV 91.2 78.0 - 100.0 fL   MCH 30.4 26.0 - 34.0 pg   MCHC 33.3 30.0 - 36.0 g/dL   RDW 13.1 11.5 - 15.5 %   Platelets 206 150 - 400 K/uL  Comprehensive metabolic panel     Status: Abnormal   Collection Time: 11/16/16  4:37 AM  Result Value Ref Range   Sodium 137 135 - 145 mmol/L    Comment: DELTA CHECK NOTED   Potassium 3.8 3.5 - 5.1 mmol/L    Comment: DELTA CHECK NOTED   Chloride 104 101 - 111 mmol/L   CO2 26 22 - 32 mmol/L   Glucose, Bld 205 (H) 65 - 99 mg/dL   BUN 35 (H) 6 - 20 mg/dL   Creatinine, Ser 1.19 (H) 0.44 - 1.00 mg/dL   Calcium 9.2 8.9 -  10.3 mg/dL   Total Protein 5.8 (L) 6.5 - 8.1 g/dL   Albumin 3.0 (L) 3.5 - 5.0 g/dL   AST 10 (L) 15 - 41 U/L   ALT 12 (L) 14 - 54 U/L   Alkaline Phosphatase 94 38 - 126 U/L   Total Bilirubin 0.3 0.3 - 1.2  mg/dL   GFR calc non Af Amer 40 (L) >60 mL/min   GFR calc Af Amer 46 (L) >60 mL/min    Comment: (NOTE) The eGFR has been calculated using the CKD EPI equation. This calculation has not been validated in all clinical situations. eGFR's persistently <60 mL/min signify possible Chronic Kidney Disease.    Anion gap 7 5 - 15  Glucose, capillary     Status: Abnormal   Collection Time: 11/16/16  7:30 AM  Result Value Ref Range   Glucose-Capillary 143 (H) 65 - 99 mg/dL   Comment 1 Notify RN    Comment 2 Document in Chart   Glucose, capillary     Status: Abnormal   Collection Time: 11/16/16 11:17 AM  Result Value Ref Range   Glucose-Capillary 162 (H) 65 - 99 mg/dL   Comment 1 Notify RN    Comment 2 Document in Chart   Glucose, capillary     Status: Abnormal   Collection Time: 11/16/16  4:47 PM  Result Value Ref Range   Glucose-Capillary 137 (H) 65 - 99 mg/dL   Comment 1 Notify RN    Comment 2 Document in Chart   Glucose, capillary     Status: Abnormal   Collection Time: 11/16/16  8:15 PM  Result Value Ref Range   Glucose-Capillary 333 (H) 65 - 99 mg/dL   Comment 1 Notify RN    Comment 2 Document in Chart   Glucose, capillary     Status: Abnormal   Collection Time: 11/17/16  1:13 AM  Result Value Ref Range   Glucose-Capillary 153 (H) 65 - 99 mg/dL   Comment 1 Notify RN    Comment 2 Document in Chart   Glucose, capillary     Status: Abnormal   Collection Time: 11/17/16  5:54 AM  Result Value Ref Range   Glucose-Capillary 271 (H) 65 - 99 mg/dL   Comment 1 Notify RN    Comment 2 Document in Chart   Glucose, capillary     Status: Abnormal   Collection Time: 11/17/16  7:30 AM  Result Value Ref Range   Glucose-Capillary 297 (H) 65 - 99 mg/dL    ABGS No results for input(s): PHART, PO2ART, TCO2, HCO3 in the last 72 hours.  Invalid input(s): PCO2 CULTURES Recent Results (from the past 240 hour(s))  MRSA PCR Screening     Status: Abnormal   Collection Time: 11/16/16  3:51 AM   Result Value Ref Range Status   MRSA by PCR POSITIVE (A) NEGATIVE Final    Comment:        The GeneXpert MRSA Assay (FDA approved for NASAL specimens only), is one component of a comprehensive MRSA colonization surveillance program. It is not intended to diagnose MRSA infection nor to guide or monitor treatment for MRSA infections. RESULT CALLED TO, READ BACK BY AND VERIFIED WITH: HAIGHT Z. AT 0626A ON [id number] BY THOMPSON S.    Studies/Results: Dg Chest 2 View  Result Date: 11/15/2016 CLINICAL DATA:  Chest pain and weakness. EXAM: CHEST  2 VIEW COMPARISON:  08/24/2016 and 08/19/2016 radiographs FINDINGS: Cardiomegaly and mild interstitial prominence again noted. There is no evidence of  focal airspace disease, pulmonary edema, suspicious pulmonary nodule/mass, pleural effusion, or pneumothorax. No acute bony abnormalities are identified. IMPRESSION: No evidence of acute cardiopulmonary disease. Cardiomegaly and chronic interstitial prominence. Electronically Signed   By: Margarette Canada M.D.   On: 11/15/2016 19:27   Dg Shoulder Right  Result Date: 11/15/2016 CLINICAL DATA:  Fall, pain in the shoulders EXAM: RIGHT SHOULDER - 2+ VIEW COMPARISON:  None. FINDINGS: No fracture or dislocation. Mild inferior glenohumeral degenerative change. Narrowing of the subacromial space. Right lung apex is clear IMPRESSION: 1. No fracture or dislocation 2. Narrowed appearance of the subacromial space suggesting rotator cuff pathology Electronically Signed   By: Donavan Foil M.D.   On: 11/15/2016 23:26   Ct Head Wo Contrast  Result Date: 11/15/2016 CLINICAL DATA:  83 y/o  F; altered mental status. EXAM: CT HEAD WITHOUT CONTRAST TECHNIQUE: Contiguous axial images were obtained from the base of the skull through the vertex without intravenous contrast. COMPARISON:  None. 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 Small chronic lacunar infarct within left cerebellar hemisphere. Moderate chronic microvascular ischemic changes of white matter parenchymal volume loss of the brain. No large acute infarct, intracranial hemorrhage, or focal mass effect. No hydrocephalus or effacement of basilar cisterns. No extra-axial collection identified. Vascular: Calcific atherosclerosis of carotid siphons. Skull: Normal. Negative for fracture or focal lesion. Sinuses/Orbits: Bilateral mastoid effusions. Normal aeration of paranasal sinuses. Bilateral intra-ocular lens replacement. Other: 12 mm nodule in the left occipital region scalp, direct visualization recommended. IMPRESSION: 1. No acute intracranial abnormality identified. 2. 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 3. Moderate for age chronic microvascular ischemic changes and parenchymal volume loss of the brain. 4. Bilateral mastoid effusions. 5. 12 mm nodule left suboccipital scalp, direct visualization recommended, probably sebaceous cyst. Electronically Signed   By: Kristine Garbe M.D.   On: 11/15/2016 23:10   US Renal  Result Date: 11/16/2016 CLINICAL DATA:  Acute renal failure EXAM: RENAL / URINARY TRACT ULTRASOUND COMPLETE COMPARISON:  None. FINDINGS: Right Kidney: Length: 11.1 cm. Echogenicity within normal limits. No solid mass or hydronephrosis visualized. Anechoic mass in the upper pole of the right kidney measuring 2.7 x 1.6 x 1.9 cm. Anechoic mass in the inferior pole of the right kidney measuring 5.7 x 4.6 x 4 cm. Left Kidney: Length: 12.6 cm. Echogenicity within normal limits. No solid mass or hydronephrosis visualized. 2.5 x 1.7 x 2 cm anechoic left inferior pole renal mass most consistent with a cyst. 1.9 x 1.3 x 2.0 cm anechoic midpole left renal mass most consistent with a cyst. 1.6 cm anechoic upper pole renal mass most consistent with a cyst. Bladder:  Appears normal for degree of bladder distention. IMPRESSION: 1. No obstructive uropathy. 2. Bilateral renal cysts. Electronically Signed   By: Kathreen Devoid   On: 11/16/2016 11:01   Dg Shoulder Left  Result Date: 11/15/2016 CLINICAL DATA:  Fall, history of arthritis EXAM: LEFT SHOULDER - 2+ VIEW COMPARISON:  None. FINDINGS: No acute displaced fracture or malalignment. AC joint degenerative change and minimal inferior glenohumeral degenerative change. IMPRESSION: Minimal degenerative changes.  No acute osseous abnormality Electronically Signed   By: Donavan Foil M.D.   On: 11/15/2016 23:25    Medications:  Prior to Admission:  Prescriptions Prior to Admission  Medication Sig Dispense Refill Last Dose  . acetaminophen (TYLENOL)  325 MG tablet Take 2 tablets (650 mg total) by mouth every 6 (six) hours as needed for mild pain or moderate pain. 60 tablet 3 11/14/2016 at Unknown time  . amLODipine (NORVASC) 10 MG tablet Place 10 mg into feeding tube daily.   11/15/2016 at Unknown time  . aspirin EC 81 MG tablet Take 81 mg by mouth daily.   11/15/2016 at Unknown time  . atorvastatin (LIPITOR) 20 MG tablet Place 20 mg into feeding tube daily.   11/14/2016 at Unknown time  . cholecalciferol (VITAMIN D) 1000 units tablet Place 1,000 Units into feeding tube daily.   11/15/2016 at Unknown time  . famotidine (PEPCID) 20 MG tablet Place 20 mg into feeding tube daily.   11/15/2016 at Unknown time  . fluticasone (FLONASE) 50 MCG/ACT nasal spray Place 2 sprays into both nostrils daily.   11/15/2016 at Unknown time  . hydrALAZINE (APRESOLINE) 25 MG tablet Place 25 mg into feeding tube 3 (three) times daily.   11/15/2016 at Unknown time  . levothyroxine (SYNTHROID, LEVOTHROID) 50 MCG tablet Place 50 mcg into feeding tube daily before breakfast.   11/15/2016 at Unknown time  . magnesium oxide (MAG-OX) 400 MG tablet Place 400 mg into feeding tube daily.   11/15/2016 at Unknown time  . Multiple Vitamin (MULTIVITAMIN WITH MINERALS) TABS  tablet Place 1 tablet into feeding tube daily.   11/15/2016 at Unknown time  . nystatin (MYCOSTATIN/NYSTOP) powder Apply topically 2 (two) times daily as needed (for redness).   unknown  . traZODone (DESYREL) 50 MG tablet Take 50 mg by mouth at bedtime.   11/14/2016 at Unknown time  . Wheat Dextrin (BENEFIBER PO) 10 mLs by Gastric Tube route daily.   11/15/2016 at Unknown time   Scheduled: . amLODipine  10 mg Per Tube Daily  . aspirin EC  81 mg Oral Daily  . atorvastatin  20 mg Per Tube Daily  . Chlorhexidine Gluconate Cloth  6 each Topical Q0600  . cholecalciferol  400 Units Oral Daily  . famotidine  20 mg Per Tube Daily  . fluticasone  2 spray Each Nare Daily  . heparin  5,000 Units Subcutaneous Q8H  . hydrALAZINE  25 mg Per Tube TID  . insulin aspart  0-15 Units Subcutaneous TID WC  . insulin aspart  0-5 Units Subcutaneous QHS  . insulin glargine  12 Units Subcutaneous Daily  . levothyroxine  50 mcg Per Tube QAC breakfast  . magnesium oxide  400 mg Per Tube Daily  . multivitamin with minerals  1 tablet Per Tube Daily  . mupirocin ointment  1 application Nasal BID  . nystatin   Topical BID  . pneumococcal 23 valent vaccine  0.5 mL Intramuscular Tomorrow-1000  . psyllium  1 packet Per Tube Daily  . traZODone  50 mg Oral QHS   Continuous: . cefTRIAXone (ROCEPHIN)  IV Stopped (11/16/16 2157)   RDW:[id number] **OR** acetaminophen, hydrALAZINE  Assesment:She is admitted with urinary tract infection. She has diabetes and her hemoglobin A1c was 12. She will start Lantus today. She will continue sensitive correction scale. Her urinary tract infection is a recurrent problem and she's better but some of it may be related to her blood sugar but I am going to go ahead and ask urology to see her because she has been in and out of the hospital on several occasions with urinary tract infections. 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 Principal Problem:   UTI (urinary tract  infection) Active  Problems:   Diabetes mellitus without complication ([place])   ARF (acute renal failure) ([place])   Hyperglycemia   Hyponatremia    Plan: As above    LOS: 2 days   Rodel Glaspy L 11/17/2016, 8:47 AM

## 2016-11-17 NOTE — Consult Note (Signed)
Urology Consult  Referring physician: Dr. Luan Pulling Reason for referral: recurrent UTIs  Chief Complaint: dysuria   History of Present Illness: Regina Bush is a 83yo with a hx of Dementia, DMII, and recurrent UTIs admitted with confusion and concern for UTI. Pt is currently confused and the majority of her history was obtained from her daughter. She has had a UTI every 6-8 weeks for the past 4 years. With each UTI she develops dysuria, urinary frequency and incontinence. She wears pads due to the incontinence. She soaks 4-5 pads per day. No hx of nephrolithiasis. No gross hematuria. No hx of urinary retention. Urine culture currently growing E coli. Renal US showed no renal or bladder calculi.  Past Medical History:  Diagnosis Date  . Anemia   . Arthritis   . Cerebral infarction ([place])   . Coronary artery disease   . Dementia   . Diabetes mellitus without complication (HCC)    Type 2  . Dysphagia due to recent cerebral infarction   . Failure to thrive (0-17)   . Hypercalcemia   . Hypertension   . Pneumonitis   . Protein calorie malnutrition ([place])   . Subdural hematoma ([place])   . Thyroid disease    History reviewed. No pertinent surgical history.  Medications: I have reviewed the patient's current medications. Allergies:  Allergies  Allergen Reactions  . Cymbalta [Duloxetine Hcl] Other (See Comments)    unknown  . Effexor [Venlafaxine] Other (See Comments)    unknown  . Morphine And Related Other (See Comments)    unknown  . Tape     adhesive    Family History  Problem Relation Age of Onset  . Family history unknown: Yes   Social History:  has an unknown smoking status. She has never used smokeless tobacco. She reports that she does not drink alcohol or use drugs.  Review of Systems  Constitutional: Positive for malaise/fatigue.  Genitourinary: Positive for dysuria, frequency and urgency.  All other systems reviewed and are negative.   Physical Exam:  Vital signs in  last 24 hours: Temp:  [98.4 F (36.9 C)] 98.4 F (36.9 C) (09/05 1500) Pulse Rate:  [61-71] 71 (09/05 1500) Resp:  [18] 18 (09/05 1500) BP: (135-143)/(60-65) 135/65 (09/05 1500) SpO2:  [95 %-96 %] 95 % (09/05 2009) Weight:  [70.1 kg (154 lb 9 oz)] 70.1 kg (154 lb 9 oz) (09/05 0543) Physical Exam  Constitutional: She appears well-developed and well-nourished.  HENT:  Head: Normocephalic and atraumatic.  Eyes: Pupils are equal, round, and reactive to light. EOM are normal.  Neck: Normal range of motion. No thyromegaly present.  Cardiovascular: Normal rate and regular rhythm.   Respiratory: Effort normal. No respiratory distress.  GI: Soft. She exhibits no distension and no mass. There is no tenderness. There is no rebound and no guarding.  Musculoskeletal: Normal range of motion. She exhibits no edema.  Neurological: She is alert. She is disoriented.  Skin: Skin is warm and dry.  Psychiatric: She has a normal mood and affect. Her speech is delayed. She is slowed and actively hallucinating. Thought content is delusional.    Laboratory Data:  Results for orders placed or performed during the hospital encounter of 11/15/16 (from the past 72 hour(s))  CBC with Differential     Status: Abnormal   Collection Time: 11/15/16  6:49 PM  Result Value Ref Range   WBC 8.9 4.0 - 10.5 K/uL   RBC 3.88 3.87 - 5.11 MIL/uL   Hemoglobin 12.0 12.0 -  15.0 g/dL   HCT 08.7 (L) 09.7 - 18.8 %   MCV 92.0 78.0 - 100.0 fL   MCH 30.9 26.0 - 34.0 pg   MCHC 33.6 30.0 - 36.0 g/dL   RDW 91.4 35.0 - 00.3 %   Platelets 221 150 - 400 K/uL   Neutrophils Relative % 71 %   Neutro Abs 6.2 1.7 - 7.7 K/uL   Lymphocytes Relative 21 %   Lymphs Abs 1.9 0.7 - 4.0 K/uL   Monocytes Relative 7 %   Monocytes Absolute 0.6 0.1 - 1.0 K/uL   Eosinophils Relative 1 %   Eosinophils Absolute 0.1 0.0 - 0.7 K/uL   Basophils Relative 0 %   Basophils Absolute 0.0 0.0 - 0.1 K/uL  Basic metabolic panel     Status: Abnormal    Collection Time: 11/15/16  6:49 PM  Result Value Ref Range   Sodium 129 (L) 135 - 145 mmol/L   Potassium 5.2 (H) 3.5 - 5.1 mmol/L   Chloride 97 (L) 101 - 111 mmol/L   CO2 23 22 - 32 mmol/L   Glucose, Bld 552 (HH) 65 - 99 mg/dL    Comment: CRITICAL RESULT CALLED TO, READ BACK BY AND VERIFIED WITH: LURZ,J AT 1949 ON 9.3.2018 BY ISLEY,B    BUN 44 (H) 6 - 20 mg/dL   Creatinine, Ser 8.19 (H) 0.44 - 1.00 mg/dL   Calcium 9.2 8.9 - 04.0 mg/dL   GFR calc non Af Amer 25 (L) >60 mL/min   GFR calc Af Amer 29 (L) >60 mL/min    Comment: (NOTE) The eGFR has been calculated using the CKD EPI equation. This calculation has not been validated in all clinical situations. eGFR's persistently <60 mL/min signify possible Chronic Kidney Disease.    Anion gap 9 5 - 15  Hemoglobin A1c     Status: Abnormal   Collection Time: 11/15/16  6:49 PM  Result Value Ref Range   Hgb A1c MFr Bld 12.0 (H) 4.8 - 5.6 %    Comment: (NOTE) Pre diabetes:          5.7%-6.4% Diabetes:              >6.4% Glycemic control for   <7.0% adults with diabetes    Mean Plasma Glucose 297.7 mg/dL    Comment: Performed at [facility], [address], [place], [place] [address]  Urinalysis, Routine w reflex microscopic     Status: Abnormal   Collection Time: 11/15/16  8:00 PM  Result Value Ref Range   Color, Urine YELLOW YELLOW   APPearance TURBID (A) CLEAR   Specific Gravity, Urine 1.018 1.005 - 1.030   pH 5.0 5.0 - 8.0   Glucose, UA >=500 (A) NEGATIVE mg/dL   Hgb urine dipstick NEGATIVE NEGATIVE   Bilirubin Urine NEGATIVE NEGATIVE   Ketones, ur NEGATIVE NEGATIVE mg/dL   Protein, ur 859 (A) NEGATIVE mg/dL   Nitrite POSITIVE (A) NEGATIVE   Leukocytes, UA LARGE (A) NEGATIVE   RBC / HPF 6-30 0 - 5 RBC/hpf   WBC, UA TOO NUMEROUS TO COUNT 0 - 5 WBC/hpf   Bacteria, UA MANY (A) NONE SEEN   Squamous Epithelial / LPF 0-5 (A) NONE SEEN   WBC Clumps PRESENT    Budding Yeast PRESENT    Non Squamous Epithelial 0-5 (A) NONE  SEEN  Urine culture     Status: Abnormal (Preliminary result)   Collection Time: 11/15/16  8:42 PM  Result Value Ref Range   Specimen  Description URINE, CLEAN CATCH    Special Requests NONE    Culture >=100,000 COLONIES/mL ESCHERICHIA COLI (A)    Report Status PENDING   Glucose, capillary     Status: Abnormal   Collection Time: 11/15/16 11:36 PM  Result Value Ref Range   Glucose-Capillary 368 (H) 65 - 99 mg/dL  MRSA PCR Screening     Status: Abnormal   Collection Time: 11/16/16  3:51 AM  Result Value Ref Range   MRSA by PCR POSITIVE (A) NEGATIVE    Comment:        The GeneXpert MRSA Assay (FDA approved for NASAL specimens only), is one component of a comprehensive MRSA colonization surveillance program. It is not intended to diagnose MRSA infection nor to guide or monitor treatment for MRSA infections. RESULT CALLED TO, READ BACK BY AND VERIFIED WITH: HAIGHT Z. AT 0626A ON [id number] BY THOMPSON S.   Glucose, capillary     Status: Abnormal   Collection Time: 11/16/16  4:29 AM  Result Value Ref Range   Glucose-Capillary 218 (H) 65 - 99 mg/dL   Comment 1 Notify RN    Comment 2 Document in Chart   CBC     Status: Abnormal   Collection Time: 11/16/16  4:37 AM  Result Value Ref Range   WBC 8.4 4.0 - 10.5 K/uL   RBC 3.88 3.87 - 5.11 MIL/uL   Hemoglobin 11.8 (L) 12.0 - 15.0 g/dL   HCT 58.9 (L) 09.1 - 27.1 %   MCV 91.2 78.0 - 100.0 fL   MCH 30.4 26.0 - 34.0 pg   MCHC 33.3 30.0 - 36.0 g/dL   RDW 04.1 41.2 - 05.5 %   Platelets 206 150 - 400 K/uL  Comprehensive metabolic panel     Status: Abnormal   Collection Time: 11/16/16  4:37 AM  Result Value Ref Range   Sodium 137 135 - 145 mmol/L    Comment: DELTA CHECK NOTED   Potassium 3.8 3.5 - 5.1 mmol/L    Comment: DELTA CHECK NOTED   Chloride 104 101 - 111 mmol/L   CO2 26 22 - 32 mmol/L   Glucose, Bld 205 (H) 65 - 99 mg/dL   BUN 35 (H) 6 - 20 mg/dL   Creatinine, Ser 8.79 (H) 0.44 - 1.00 mg/dL   Calcium 9.2 8.9 - 78.4 mg/dL    Total Protein 5.8 (L) 6.5 - 8.1 g/dL   Albumin 3.0 (L) 3.5 - 5.0 g/dL   AST 10 (L) 15 - 41 U/L   ALT 12 (L) 14 - 54 U/L   Alkaline Phosphatase 94 38 - 126 U/L   Total Bilirubin 0.3 0.3 - 1.2 mg/dL   GFR calc non Af Amer 40 (L) >60 mL/min   GFR calc Af Amer 46 (L) >60 mL/min    Comment: (NOTE) The eGFR has been calculated using the CKD EPI equation. This calculation has not been validated in all clinical situations. eGFR's persistently <60 mL/min signify possible Chronic Kidney Disease.    Anion gap 7 5 - 15  Glucose, capillary     Status: Abnormal   Collection Time: 11/16/16  7:30 AM  Result Value Ref Range   Glucose-Capillary 143 (H) 65 - 99 mg/dL   Comment 1 Notify RN    Comment 2 Document in Chart   Glucose, capillary     Status: Abnormal   Collection Time: 11/16/16 11:17 AM  Result Value Ref Range   Glucose-Capillary 162 (H) 65 - 99 mg/dL   Comment  1 Notify RN    Comment 2 Document in Chart   Glucose, capillary     Status: Abnormal   Collection Time: 11/16/16  4:47 PM  Result Value Ref Range   Glucose-Capillary 137 (H) 65 - 99 mg/dL   Comment 1 Notify RN    Comment 2 Document in Chart   Glucose, capillary     Status: Abnormal   Collection Time: 11/16/16  8:15 PM  Result Value Ref Range   Glucose-Capillary 333 (H) 65 - 99 mg/dL   Comment 1 Notify RN    Comment 2 Document in Chart   Glucose, capillary     Status: Abnormal   Collection Time: 11/17/16  1:13 AM  Result Value Ref Range   Glucose-Capillary 153 (H) 65 - 99 mg/dL   Comment 1 Notify RN    Comment 2 Document in Chart   Glucose, capillary     Status: Abnormal   Collection Time: 11/17/16  5:54 AM  Result Value Ref Range   Glucose-Capillary 271 (H) 65 - 99 mg/dL   Comment 1 Notify RN    Comment 2 Document in Chart   Glucose, capillary     Status: Abnormal   Collection Time: 11/17/16  7:30 AM  Result Value Ref Range   Glucose-Capillary 297 (H) 65 - 99 mg/dL  Glucose, capillary     Status: Abnormal    Collection Time: 11/17/16 11:49 AM  Result Value Ref Range   Glucose-Capillary 193 (H) 65 - 99 mg/dL  Glucose, capillary     Status: Abnormal   Collection Time: 11/17/16  4:32 PM  Result Value Ref Range   Glucose-Capillary 211 (H) 65 - 99 mg/dL  Glucose, capillary     Status: Abnormal   Collection Time: 11/17/16  9:32 PM  Result Value Ref Range   Glucose-Capillary 201 (H) 65 - 99 mg/dL   Comment 1 Notify RN    Comment 2 Document in Chart    Recent Results (from the past 240 hour(s))  Urine culture     Status: Abnormal (Preliminary result)   Collection Time: 11/15/16  8:42 PM  Result Value Ref Range Status   Specimen Description URINE, CLEAN CATCH  Final   Special Requests NONE  Final   Culture >=100,000 COLONIES/mL ESCHERICHIA COLI (A)  Final   Report Status PENDING  Incomplete  MRSA PCR Screening     Status: Abnormal   Collection Time: 11/16/16  3:51 AM  Result Value Ref Range Status   MRSA by PCR POSITIVE (A) NEGATIVE Final    Comment:        The GeneXpert MRSA Assay (FDA approved for NASAL specimens only), is one component of a comprehensive MRSA colonization surveillance program. It is not intended to diagnose MRSA infection nor to guide or monitor treatment for MRSA infections. RESULT CALLED TO, READ BACK BY AND VERIFIED WITH: HAIGHT Z. AT 0626A ON [id number] BY THOMPSON S.    Creatinine:  Recent Labs  11/15/16 1849 11/16/16 0437  CREATININE 1.74* 1.19*   Baseline Creatinine: 1.1  Impression/Assessment:  83yo with recurrent UTIs  Plan:  1. Continue broad spectrum antibiotics pending urine culture. The majority of her cultures were pan sensitive E coli and enterococcus. I discussed the treatment of recurrent UTIs including timed voiding, antibiotic prophylaxis, and estrogen therapy. After discussing the options the daughter wishes to proceed wit antibiotic prophylaxis. She should start trimethoprim '100mg'$  qhs after she has completed a 10 day course of culture  specific antibiotics. She should  followup in the [facility] urology office in 4-6 weeks after discharge  Nicolette Bang 11/17/2016, 9:45 PM

## 2016-11-17 NOTE — Progress Notes (Signed)
Results for KELSEI, DEFINO (MRN [id number]) as of 11/17/2016 08:25  Ref. Range 11/16/2016 16:47 11/16/2016 20:15 11/17/2016 01:13 11/17/2016 05:54 11/17/2016 07:30  Glucose-Capillary Latest Ref Range: 65 - 99 mg/dL 137 (H) 333 (H) 153 (H) 271 (H) 297 (H)  Noted that blood sugars continue to be greater than 180 mg/dl.  Recommend starting Lantus 10 units daily and continuing Novolog SENSITIVE correction scale TID as ordered.  Noted that HgbA1C is 12%.  Assisted living facility would need to give insulin to patient.  If insulin not feasible, recommend starting oral medication for diabetes.   Harvel Ricks RN BSN CDE Diabetes Coordinator Pager: [phone]  8am-5pm

## 2016-11-18 LAB — GLUCOSE, CAPILLARY
GLUCOSE-CAPILLARY: 242 mg/dL — AB (ref 65–99)
Glucose-Capillary: 181 mg/dL — ABNORMAL HIGH (ref 65–99)
Glucose-Capillary: 281 mg/dL — ABNORMAL HIGH (ref 65–99)

## 2016-11-18 LAB — URINE CULTURE

## 2016-11-18 NOTE — Evaluation (Signed)
Physical Therapy Evaluation Patient Details Name: Regina Bush MRN: [id number] DOB: [birth year] Today's Date: 11/18/2016   History of Present Illness  83 yo white female from [facility], recently moved here with husband form [place] in later 2017 after he could no longer care for her due to dementia, but husband just passed away. Has UTI and confusion, noted cardiomegaly on imaging.    PMH: dementia, UTI, CAD, DM, CVA, SDH (unclear chronicity), FTT, old proximal tibial ORIF, WC mobility level, HTN, malnutrition, stroke,   Clinical Impression  Pt was assisted in mobility but is cognitively impaired so has limited follow through on her transfers and bed mob without both verbal and tactile cues.  Her plan is to follow acutely to restore PLOF and return to ALF when strong enough, after her rehab in SNF.    Follow Up Recommendations SNF    Equipment Recommendations  None recommended by PT    Recommendations for Other Services       Precautions / Restrictions Precautions Precautions: Fall Restrictions Weight Bearing Restrictions: No      Mobility  Bed Mobility Overal bed mobility: Needs Assistance Bed Mobility: Supine to Sit;Sit to Supine     Supine to sit: Max assist Sit to supine: Max assist   General bed mobility comments: pt does not voluntarily assist with any mobility  Transfers Overall transfer level: Needs assistance Equipment used: Rolling walker (2 wheeled);1 person hand held assist Transfers: Sit to/from [facility] Sit to Stand: Max assist Stand pivot transfers: Max assist          Ambulation/Gait Ambulation/Gait assistance: Max assist;+2 physical assistance;+2 safety/equipment Ambulation Distance (Feet): 1 Feet Assistive device: Rolling walker (2 wheeled);2 person hand held assist Gait Pattern/deviations: Step-to pattern;Shuffle;Decreased stride length;Ataxic Gait velocity: reduced Gait velocity interpretation: Below normal speed for  age/gender General Gait Details: disorganized thoughts and following of instructions, unsafe to stand and maneuver  Holiday representative Wheelchair mobility:  (PLOF was I wc mobility)  Modified Rankin (Stroke Patients Only) Modified Rankin (Stroke Patients Only) Pre-Morbid Rankin Score: Moderately severe disability Modified Rankin: Severe disability     Balance Overall balance assessment: Needs assistance Sitting-balance support: Feet supported;Bilateral upper extremity supported Sitting balance-Leahy Scale: Fair   Postural control: Posterior lean Standing balance support: Bilateral upper extremity supported                                 Pertinent Vitals/Pain Pain Assessment: Faces Faces Pain Scale: Hurts little more Pain Location: back per pt Pain Descriptors / Indicators: Sore Pain Intervention(s): Limited activity within patient's tolerance;Repositioned    Home Living Family/patient expects to be discharged to:: Assisted living               Home Equipment: Wheelchair - manual Additional Comments: has been WC ambulator for last year    Prior Function Level of Independence: Needs assistance   Gait / Transfers Assistance Needed: RW to transfer, wc mobility  ADL's / Homemaking Assistance Needed: assistance for dressing and bathing        Hand Dominance        Extremity/Trunk Assessment   Upper Extremity Assessment Upper Extremity Assessment: Generalized weakness    Lower Extremity Assessment Lower Extremity Assessment: Generalized weakness    Cervical / Trunk Assessment Cervical / Trunk Assessment: Kyphotic  Communication   Communication: Expressive difficulties (related  to dementia)  Cognition Arousal/Alertness: Lethargic Behavior During Therapy: Agitated;Flat affect;Impulsive;Anxious;Restless (variable behavior) Overall Cognitive Status: No family/caregiver present to determine baseline  cognitive functioning                                 General Comments: husband just passed away      General Comments      Exercises     Assessment/Plan    PT Assessment Patient needs continued PT services  PT Problem List Decreased strength;Decreased range of motion;Decreased activity tolerance;Decreased balance;Decreased mobility;Decreased coordination;Decreased cognition;Decreased knowledge of use of DME;Decreased safety awareness;Decreased knowledge of precautions;Decreased skin integrity       PT Treatment Interventions DME instruction;Functional mobility training;Therapeutic activities;Therapeutic exercise;Balance training;Neuromuscular re-education;Patient/family education    PT Goals (Current goals can be found in the Care Plan section)  Acute Rehab PT Goals Patient Stated Goal: none stated PT Goal Formulation: Patient unable to participate in goal setting Time For Goal Achievement: 12/02/16 Potential to Achieve Goals: Fair    Frequency Min 2X/week   Barriers to discharge Decreased caregiver support (ALF is lower staffed on pt care for her needs)      Co-evaluation               AM-PAC PT "6 Clicks" Daily Activity  Outcome Measure Difficulty turning over in bed (including adjusting bedclothes, sheets and blankets)?: Unable Difficulty moving from lying on back to sitting on the side of the bed? : Unable Difficulty sitting down on and standing up from a chair with arms (e.g., wheelchair, bedside commode, etc,.)?: Unable Help needed moving to and from a bed to chair (including a wheelchair)?: A Lot Help needed walking in hospital room?: Total Help needed climbing 3-5 steps with a railing? : Total 6 Click Score: 7    End of Session Equipment Utilized During Treatment: Gait belt Activity Tolerance: Patient limited by fatigue (weakness and cognitive decline) Patient left: in bed;with call bell/phone within reach;with nursing/sitter in room;with  bed alarm set Nurse Communication: Mobility status PT Visit Diagnosis: Unsteadiness on feet (R26.81);Other abnormalities of gait and mobility (R26.89);Muscle weakness (generalized) (M62.81);Difficulty in walking, not elsewhere classified (R26.2)    Time: 9833-8250 PT Time Calculation (min) (ACUTE ONLY): 34 min   Charges:   PT Evaluation $PT Eval Moderate Complexity: 1 Mod PT Treatments $Therapeutic Activity: 8-22 mins   PT G Codes:        Ramond Dial 2016-11-26, 3:54 PM   3:55 PM, Nov 26, 2016 Mee Hives, PT, MS Physical Therapist - [place] [phone] (Office)

## 2016-11-18 NOTE — Progress Notes (Signed)
Subjective: She looks much better. She was somewhat agitated yesterday but better today. Blood sugars are better. Urology consultation noted and appreciated  Objective: Vital signs in last 24 hours: Temp:  [98.1 F (36.7 C)-98.4 F (36.9 C)] 98.1 F (36.7 C) (09/05 2218) Pulse Rate:  [71-80] 80 (09/05 2218) Resp:  [18-20] 20 (09/05 2218) BP: (135-171)/(59-65) 171/59 (09/05 2218) SpO2:  [94 %-96 %] 94 % (09/05 2218) Weight change:  Last BM Date: 11/16/16  Intake/Output from previous day: 09/05 0701 - 09/06 0700 In: 720 [P.O.:720] Out: 300 [Urine:300]  PHYSICAL EXAM General appearance: alert, cooperative and Mildly confused Resp: clear to auscultation bilaterally Cardio: regular rate and rhythm, S1, S2 normal, no murmur, click, rub or gallop GI: soft, non-tender; bowel sounds normal; no masses,  no organomegaly Extremities: extremities normal, atraumatic, no cyanosis or edema Skin warm and dry  Lab Results:  Results for orders placed or performed during the hospital encounter of 11/15/16 (from the past 48 hour(s))  Glucose, capillary     Status: Abnormal   Collection Time: 11/16/16 11:17 AM  Result Value Ref Range   Glucose-Capillary 162 (H) 65 - 99 mg/dL   Comment 1 Notify RN    Comment 2 Document in Chart   Glucose, capillary     Status: Abnormal   Collection Time: 11/16/16  4:47 PM  Result Value Ref Range   Glucose-Capillary 137 (H) 65 - 99 mg/dL   Comment 1 Notify RN    Comment 2 Document in Chart   Glucose, capillary     Status: Abnormal   Collection Time: 11/16/16  8:15 PM  Result Value Ref Range   Glucose-Capillary 333 (H) 65 - 99 mg/dL   Comment 1 Notify RN    Comment 2 Document in Chart   Glucose, capillary     Status: Abnormal   Collection Time: 11/17/16  1:13 AM  Result Value Ref Range   Glucose-Capillary 153 (H) 65 - 99 mg/dL   Comment 1 Notify RN    Comment 2 Document in Chart   Glucose, capillary     Status: Abnormal   Collection Time: 11/17/16   5:54 AM  Result Value Ref Range   Glucose-Capillary 271 (H) 65 - 99 mg/dL   Comment 1 Notify RN    Comment 2 Document in Chart   Glucose, capillary     Status: Abnormal   Collection Time: 11/17/16  7:30 AM  Result Value Ref Range   Glucose-Capillary 297 (H) 65 - 99 mg/dL  Glucose, capillary     Status: Abnormal   Collection Time: 11/17/16 11:49 AM  Result Value Ref Range   Glucose-Capillary 193 (H) 65 - 99 mg/dL  Glucose, capillary     Status: Abnormal   Collection Time: 11/17/16  4:32 PM  Result Value Ref Range   Glucose-Capillary 211 (H) 65 - 99 mg/dL  Glucose, capillary     Status: Abnormal   Collection Time: 11/17/16  9:32 PM  Result Value Ref Range   Glucose-Capillary 201 (H) 65 - 99 mg/dL   Comment 1 Notify RN    Comment 2 Document in Chart   Glucose, capillary     Status: Abnormal   Collection Time: 11/18/16  7:42 AM  Result Value Ref Range   Glucose-Capillary 181 (H) 65 - 99 mg/dL    ABGS No results for input(s): PHART, PO2ART, TCO2, HCO3 in the last 72 hours.  Invalid input(s): PCO2 CULTURES Recent Results (from the past 240 hour(s))  Urine culture  Status: Abnormal   Collection Time: 11/15/16  8:42 PM  Result Value Ref Range Status   Specimen Description URINE, CLEAN CATCH  Final   Special Requests NONE  Final   Culture >=100,000 COLONIES/mL ESCHERICHIA COLI (A)  Final   Report Status 11/18/2016 FINAL  Final   Organism ID, Bacteria ESCHERICHIA COLI (A)  Final      Susceptibility   Escherichia coli - MIC*    AMPICILLIN <=2 SENSITIVE Sensitive     CEFAZOLIN <=4 SENSITIVE Sensitive     CEFTRIAXONE <=1 SENSITIVE Sensitive     CIPROFLOXACIN <=0.25 SENSITIVE Sensitive     GENTAMICIN <=1 SENSITIVE Sensitive     IMIPENEM <=0.25 SENSITIVE Sensitive     NITROFURANTOIN <=16 SENSITIVE Sensitive     TRIMETH/SULFA <=20 SENSITIVE Sensitive     AMPICILLIN/SULBACTAM <=2 SENSITIVE Sensitive     PIP/TAZO <=4 SENSITIVE Sensitive     Extended ESBL NEGATIVE Sensitive      * >=100,000 COLONIES/mL ESCHERICHIA COLI  MRSA PCR Screening     Status: Abnormal   Collection Time: 11/16/16  3:51 AM  Result Value Ref Range Status   MRSA by PCR POSITIVE (A) NEGATIVE Final    Comment:        The GeneXpert MRSA Assay (FDA approved for NASAL specimens only), is one component of a comprehensive MRSA colonization surveillance program. It is not intended to diagnose MRSA infection nor to guide or monitor treatment for MRSA infections. RESULT CALLED TO, READ BACK BY AND VERIFIED WITH: HAIGHT Z. AT 0626A ON [id number] BY THOMPSON S.    Studies/Results: US Renal  Result Date: 11/16/2016 CLINICAL DATA:  Acute renal failure EXAM: RENAL / URINARY TRACT ULTRASOUND COMPLETE COMPARISON:  None. FINDINGS: Right Kidney: Length: 11.1 cm. Echogenicity within normal limits. No solid mass or hydronephrosis visualized. Anechoic mass in the upper pole of the right kidney measuring 2.7 x 1.6 x 1.9 cm. Anechoic mass in the inferior pole of the right kidney measuring 5.7 x 4.6 x 4 cm. Left Kidney: Length: 12.6 cm. Echogenicity within normal limits. No solid mass or hydronephrosis visualized. 2.5 x 1.7 x 2 cm anechoic left inferior pole renal mass most consistent with a cyst. 1.9 x 1.3 x 2.0 cm anechoic midpole left renal mass most consistent with a cyst. 1.6 cm anechoic upper pole renal mass most consistent with a cyst. Bladder: Appears normal for degree of bladder distention. IMPRESSION: 1. No obstructive uropathy. 2. Bilateral renal cysts. Electronically Signed   By: Kathreen Devoid   On: 11/16/2016 11:01    Medications:  Prior to Admission:  Prescriptions Prior to Admission  Medication Sig Dispense Refill Last Dose  . acetaminophen (TYLENOL) 325 MG tablet Take 2 tablets (650 mg total) by mouth every 6 (six) hours as needed for mild pain or moderate pain. 60 tablet 3 11/14/2016 at Unknown time  . amLODipine (NORVASC) 10 MG tablet Place 10 mg into feeding tube daily.   11/15/2016 at Unknown time  .  aspirin EC 81 MG tablet Take 81 mg by mouth daily.   11/15/2016 at Unknown time  . atorvastatin (LIPITOR) 20 MG tablet Place 20 mg into feeding tube daily.   11/14/2016 at Unknown time  . cholecalciferol (VITAMIN D) 1000 units tablet Place 1,000 Units into feeding tube daily.   11/15/2016 at Unknown time  . famotidine (PEPCID) 20 MG tablet Place 20 mg into feeding tube daily.   11/15/2016 at Unknown time  . fluticasone (FLONASE) 50 MCG/ACT nasal spray Place 2 sprays  into both nostrils daily.   11/15/2016 at Unknown time  . hydrALAZINE (APRESOLINE) 25 MG tablet Place 25 mg into feeding tube 3 (three) times daily.   11/15/2016 at Unknown time  . levothyroxine (SYNTHROID, LEVOTHROID) 50 MCG tablet Place 50 mcg into feeding tube daily before breakfast.   11/15/2016 at Unknown time  . magnesium oxide (MAG-OX) 400 MG tablet Place 400 mg into feeding tube daily.   11/15/2016 at Unknown time  . Multiple Vitamin (MULTIVITAMIN WITH MINERALS) TABS tablet Place 1 tablet into feeding tube daily.   11/15/2016 at Unknown time  . nystatin (MYCOSTATIN/NYSTOP) powder Apply topically 2 (two) times daily as needed (for redness).   unknown  . traZODone (DESYREL) 50 MG tablet Take 50 mg by mouth at bedtime.   11/14/2016 at Unknown time  . Wheat Dextrin (BENEFIBER PO) 10 mLs by Gastric Tube route daily.   11/15/2016 at Unknown time   Scheduled: . amLODipine  10 mg Per Tube Daily  . aspirin EC  81 mg Oral Daily  . atorvastatin  20 mg Per Tube Daily  . Chlorhexidine Gluconate Cloth  6 each Topical Q0600  . cholecalciferol  400 Units Oral Daily  . famotidine  20 mg Per Tube Daily  . fluticasone  2 spray Each Nare Daily  . heparin  5,000 Units Subcutaneous Q8H  . hydrALAZINE  25 mg Per Tube TID  . insulin aspart  0-9 Units Subcutaneous TID WC  . insulin glargine  10 Units Subcutaneous Daily  . levothyroxine  50 mcg Per Tube QAC breakfast  . magnesium oxide  400 mg Per Tube Daily  . multivitamin with minerals  1 tablet Per Tube Daily  .  mupirocin ointment  1 application Nasal BID  . nystatin   Topical BID  . pneumococcal 23 valent vaccine  0.5 mL Intramuscular Tomorrow-1000  . psyllium  1 packet Per Tube Daily  . traZODone  50 mg Oral QHS   Continuous: . cefTRIAXone (ROCEPHIN)  IV Stopped (11/17/16 2300)   JSE:[id number] **OR** acetaminophen, hydrALAZINE, LORazepam  Assesment:She has urinary tract infection. Her urine is growing Escherichia coli but we don't have the sensitivities yet. However she has responded very well to Rocephin . Agree with prophylaxis once she finishes antibiotics. She had acute renal failure which is better. I think she was probably dehydrated. She was severely agitated yesterday but I think she's better today although it is early. Principal Problem:   UTI (urinary tract infection) Active Problems:   Diabetes mellitus without complication (HCC)   ARF (acute renal failure) (HCC)   Hyperglycemia   Hyponatremia    Plan: Continue IV antibiotics. Await sensitivities. Continue treatment of blood sugar. Will discuss with case management regarding getting her blood sugar checked and managed at the assisted living facility    LOS: 3 days   Brien Lowe L 11/18/2016, 8:08 AM

## 2016-11-19 ENCOUNTER — Non-Acute Institutional Stay (SKILLED_NURSING_FACILITY): Payer: Medicare Other | Admitting: Internal Medicine

## 2016-11-19 ENCOUNTER — Inpatient Hospital Stay
Admission: RE | Admit: 2016-11-19 | Discharge: 2016-12-03 | Disposition: A | Discharge status: Other Acute Inpatient Hospital | Payer: Medicare Other | Source: Ambulatory Visit | Attending: Internal Medicine | Admitting: Internal Medicine

## 2016-11-19 DIAGNOSIS — E1065 Type 1 diabetes mellitus with hyperglycemia: Secondary | ICD-10-CM | POA: Diagnosis not present

## 2016-11-19 DIAGNOSIS — E039 Hypothyroidism, unspecified: Secondary | ICD-10-CM | POA: Diagnosis not present

## 2016-11-19 DIAGNOSIS — E119 Type 2 diabetes mellitus without complications: Secondary | ICD-10-CM | POA: Diagnosis not present

## 2016-11-19 DIAGNOSIS — R739 Hyperglycemia, unspecified: Secondary | ICD-10-CM

## 2016-11-19 DIAGNOSIS — E871 Hypo-osmolality and hyponatremia: Secondary | ICD-10-CM

## 2016-11-19 DIAGNOSIS — Z5189 Encounter for other specified aftercare: Secondary | ICD-10-CM | POA: Diagnosis not present

## 2016-11-19 DIAGNOSIS — E1140 Type 2 diabetes mellitus with diabetic neuropathy, unspecified: Secondary | ICD-10-CM | POA: Diagnosis not present

## 2016-11-19 DIAGNOSIS — N390 Urinary tract infection, site not specified: Secondary | ICD-10-CM

## 2016-11-19 DIAGNOSIS — R1312 Dysphagia, oropharyngeal phase: Secondary | ICD-10-CM | POA: Diagnosis not present

## 2016-11-19 DIAGNOSIS — Q618 Other cystic kidney diseases: Secondary | ICD-10-CM | POA: Diagnosis not present

## 2016-11-19 DIAGNOSIS — I10 Essential (primary) hypertension: Secondary | ICD-10-CM

## 2016-11-19 DIAGNOSIS — N179 Acute kidney failure, unspecified: Secondary | ICD-10-CM

## 2016-11-19 DIAGNOSIS — I6203 Nontraumatic chronic subdural hemorrhage: Secondary | ICD-10-CM | POA: Diagnosis not present

## 2016-11-19 DIAGNOSIS — Z794 Long term (current) use of insulin: Secondary | ICD-10-CM | POA: Diagnosis not present

## 2016-11-19 DIAGNOSIS — R4182 Altered mental status, unspecified: Secondary | ICD-10-CM | POA: Diagnosis not present

## 2016-11-19 DIAGNOSIS — N183 Chronic kidney disease, stage 3 (moderate): Secondary | ICD-10-CM | POA: Diagnosis not present

## 2016-11-19 DIAGNOSIS — B9629 Other Escherichia coli [E. coli] as the cause of diseases classified elsewhere: Secondary | ICD-10-CM | POA: Diagnosis not present

## 2016-11-19 DIAGNOSIS — R279 Unspecified lack of coordination: Secondary | ICD-10-CM | POA: Diagnosis not present

## 2016-11-19 DIAGNOSIS — N3001 Acute cystitis with hematuria: Secondary | ICD-10-CM | POA: Diagnosis not present

## 2016-11-19 DIAGNOSIS — M6281 Muscle weakness (generalized): Secondary | ICD-10-CM | POA: Diagnosis not present

## 2016-11-19 DIAGNOSIS — E1165 Type 2 diabetes mellitus with hyperglycemia: Secondary | ICD-10-CM | POA: Diagnosis not present

## 2016-11-19 DIAGNOSIS — Z8673 Personal history of transient ischemic attack (TIA), and cerebral infarction without residual deficits: Secondary | ICD-10-CM | POA: Diagnosis not present

## 2016-11-19 DIAGNOSIS — I69391 Dysphagia following cerebral infarction: Secondary | ICD-10-CM | POA: Diagnosis not present

## 2016-11-19 DIAGNOSIS — F0390 Unspecified dementia without behavioral disturbance: Secondary | ICD-10-CM | POA: Diagnosis not present

## 2016-11-19 DIAGNOSIS — I249 Acute ischemic heart disease, unspecified: Secondary | ICD-10-CM | POA: Diagnosis not present

## 2016-11-19 DIAGNOSIS — R531 Weakness: Secondary | ICD-10-CM | POA: Diagnosis not present

## 2016-11-19 DIAGNOSIS — Z7982 Long term (current) use of aspirin: Secondary | ICD-10-CM | POA: Diagnosis not present

## 2016-11-19 DIAGNOSIS — R404 Transient alteration of awareness: Secondary | ICD-10-CM | POA: Diagnosis not present

## 2016-11-19 DIAGNOSIS — I2510 Atherosclerotic heart disease of native coronary artery without angina pectoris: Secondary | ICD-10-CM | POA: Diagnosis not present

## 2016-11-19 DIAGNOSIS — R21 Rash and other nonspecific skin eruption: Secondary | ICD-10-CM | POA: Diagnosis not present

## 2016-11-19 DIAGNOSIS — M1990 Unspecified osteoarthritis, unspecified site: Secondary | ICD-10-CM | POA: Diagnosis not present

## 2016-11-19 DIAGNOSIS — Z79899 Other long term (current) drug therapy: Secondary | ICD-10-CM | POA: Diagnosis not present

## 2016-11-19 DIAGNOSIS — D649 Anemia, unspecified: Secondary | ICD-10-CM | POA: Diagnosis not present

## 2016-11-19 DIAGNOSIS — Z885 Allergy status to narcotic agent status: Secondary | ICD-10-CM | POA: Diagnosis not present

## 2016-11-19 LAB — GLUCOSE, CAPILLARY
GLUCOSE-CAPILLARY: 214 mg/dL — AB (ref 65–99)
GLUCOSE-CAPILLARY: 207 mg/dL — AB (ref 65–99)
Glucose-Capillary: 271 mg/dL — ABNORMAL HIGH (ref 65–99)

## 2016-11-19 MED ORDER — INSULIN ASPART 100 UNIT/ML ~~LOC~~ SOLN: 0.0000 [IU] | Freq: Three times a day (TID) | SUBCUTANEOUS | 11 refills | Status: DC

## 2016-11-19 MED ORDER — CEPHALEXIN 250 MG PO CAPS: 250.0000 mg | ORAL_CAPSULE | Freq: Four times a day (QID) | ORAL | 0 refills | Status: AC

## 2016-11-19 MED ORDER — INSULIN GLARGINE 100 UNIT/ML ~~LOC~~ SOLN: 12.0000 [IU] | Freq: Every day | SUBCUTANEOUS | 11 refills | Status: DC

## 2016-11-19 NOTE — Progress Notes (Signed)
Subjective: She had a much better day yesterday. It has been recommended that she go to skilled care facility for rehabilitation and then return to ALF. She is much less confused. No new complaints have been noted.  Objective: Vital signs in last 24 hours: Temp:  [97.8 F (36.6 C)-99.9 F (37.7 C)] 97.8 F (36.6 C) (09/06 2119) Pulse Rate:  [64-70] 70 (09/06 2119) Resp:  [18-20] 20 (09/06 2119) BP: (140-167)/(46-58) 140/46 (09/06 2119) SpO2:  [92 %-95 %] 95 % (09/06 2119) Weight change:  Last BM Date: 11/16/16  Intake/Output from previous day: 09/06 0701 - 09/07 0700 In: 600 [P.O.:600] Out: 100 [Urine:100]  PHYSICAL EXAM General appearance: alert, cooperative and no distress Resp: clear to auscultation bilaterally Cardio: regular rate and rhythm, S1, S2 normal, no murmur, click, rub or gallop GI: soft, non-tender; bowel sounds normal; no masses,  no organomegaly Extremities: extremities normal, atraumatic, no cyanosis or edema Skin warm and dry  Lab Results:  Results for orders placed or performed during the hospital encounter of 11/15/16 (from the past 48 hour(s))  Glucose, capillary     Status: Abnormal   Collection Time: 11/17/16 11:49 AM  Result Value Ref Range   Glucose-Capillary 193 (H) 65 - 99 mg/dL  Glucose, capillary     Status: Abnormal   Collection Time: 11/17/16  4:32 PM  Result Value Ref Range   Glucose-Capillary 211 (H) 65 - 99 mg/dL  Glucose, capillary     Status: Abnormal   Collection Time: 11/17/16  9:32 PM  Result Value Ref Range   Glucose-Capillary 201 (H) 65 - 99 mg/dL   Comment 1 Notify RN    Comment 2 Document in Chart   Glucose, capillary     Status: Abnormal   Collection Time: 11/18/16  7:42 AM  Result Value Ref Range   Glucose-Capillary 181 (H) 65 - 99 mg/dL  Glucose, capillary     Status: Abnormal   Collection Time: 11/18/16 11:13 AM  Result Value Ref Range   Glucose-Capillary 281 (H) 65 - 99 mg/dL  Glucose, capillary     Status:  Abnormal   Collection Time: 11/18/16  4:25 PM  Result Value Ref Range   Glucose-Capillary 242 (H) 65 - 99 mg/dL   Comment 1 Notify RN    Comment 2 Document in Chart   Glucose, capillary     Status: Abnormal   Collection Time: 11/19/16  1:16 AM  Result Value Ref Range   Glucose-Capillary 271 (H) 65 - 99 mg/dL   Comment 1 Notify RN    Comment 2 Document in Chart   Glucose, capillary     Status: Abnormal   Collection Time: 11/19/16  7:26 AM  Result Value Ref Range   Glucose-Capillary 214 (H) 65 - 99 mg/dL   Comment 1 Notify RN    Comment 2 Document in Chart     ABGS No results for input(s): PHART, PO2ART, TCO2, HCO3 in the last 72 hours.  Invalid input(s): PCO2 CULTURES Recent Results (from the past 240 hour(s))  Urine culture     Status: Abnormal   Collection Time: 11/15/16  8:42 PM  Result Value Ref Range Status   Specimen Description URINE, CLEAN CATCH  Final   Special Requests NONE  Final   Culture >=100,000 COLONIES/mL ESCHERICHIA COLI (A)  Final   Report Status 11/18/2016 FINAL  Final   Organism ID, Bacteria ESCHERICHIA COLI (A)  Final      Susceptibility   Escherichia coli - MIC*  AMPICILLIN <=2 SENSITIVE Sensitive     CEFAZOLIN <=4 SENSITIVE Sensitive     CEFTRIAXONE <=1 SENSITIVE Sensitive     CIPROFLOXACIN <=0.25 SENSITIVE Sensitive     GENTAMICIN <=1 SENSITIVE Sensitive     IMIPENEM <=0.25 SENSITIVE Sensitive     NITROFURANTOIN <=16 SENSITIVE Sensitive     TRIMETH/SULFA <=20 SENSITIVE Sensitive     AMPICILLIN/SULBACTAM <=2 SENSITIVE Sensitive     PIP/TAZO <=4 SENSITIVE Sensitive     Extended ESBL NEGATIVE Sensitive     * >=100,000 COLONIES/mL ESCHERICHIA COLI  MRSA PCR Screening     Status: Abnormal   Collection Time: 11/16/16  3:51 AM  Result Value Ref Range Status   MRSA by PCR POSITIVE (A) NEGATIVE Final    Comment:        The GeneXpert MRSA Assay (FDA approved for NASAL specimens only), is one component of a comprehensive MRSA  colonization surveillance program. It is not intended to diagnose MRSA infection nor to guide or monitor treatment for MRSA infections. RESULT CALLED TO, READ BACK BY AND VERIFIED WITH: HAIGHT Z. AT 0626A ON [id number] BY THOMPSON S.    Studies/Results: No results found.  Medications:  Prior to Admission:  Prescriptions Prior to Admission  Medication Sig Dispense Refill Last Dose  . acetaminophen (TYLENOL) 325 MG tablet Take 2 tablets (650 mg total) by mouth every 6 (six) hours as needed for mild pain or moderate pain. 60 tablet 3 11/14/2016 at Unknown time  . amLODipine (NORVASC) 10 MG tablet Place 10 mg into feeding tube daily.   11/15/2016 at Unknown time  . aspirin EC 81 MG tablet Take 81 mg by mouth daily.   11/15/2016 at Unknown time  . atorvastatin (LIPITOR) 20 MG tablet Place 20 mg into feeding tube daily.   11/14/2016 at Unknown time  . cholecalciferol (VITAMIN D) 1000 units tablet Place 1,000 Units into feeding tube daily.   11/15/2016 at Unknown time  . famotidine (PEPCID) 20 MG tablet Place 20 mg into feeding tube daily.   11/15/2016 at Unknown time  . fluticasone (FLONASE) 50 MCG/ACT nasal spray Place 2 sprays into both nostrils daily.   11/15/2016 at Unknown time  . hydrALAZINE (APRESOLINE) 25 MG tablet Place 25 mg into feeding tube 3 (three) times daily.   11/15/2016 at Unknown time  . levothyroxine (SYNTHROID, LEVOTHROID) 50 MCG tablet Place 50 mcg into feeding tube daily before breakfast.   11/15/2016 at Unknown time  . magnesium oxide (MAG-OX) 400 MG tablet Place 400 mg into feeding tube daily.   11/15/2016 at Unknown time  . Multiple Vitamin (MULTIVITAMIN WITH MINERALS) TABS tablet Place 1 tablet into feeding tube daily.   11/15/2016 at Unknown time  . nystatin (MYCOSTATIN/NYSTOP) powder Apply topically 2 (two) times daily as needed (for redness).   unknown  . traZODone (DESYREL) 50 MG tablet Take 50 mg by mouth at bedtime.   11/14/2016 at Unknown time  . Wheat Dextrin (BENEFIBER PO) 10 mLs by  Gastric Tube route daily.   11/15/2016 at Unknown time   Scheduled: . amLODipine  10 mg Per Tube Daily  . aspirin EC  81 mg Oral Daily  . atorvastatin  20 mg Per Tube Daily  . Chlorhexidine Gluconate Cloth  6 each Topical Q0600  . cholecalciferol  400 Units Oral Daily  . famotidine  20 mg Per Tube Daily  . fluticasone  2 spray Each Nare Daily  . heparin  5,000 Units Subcutaneous Q8H  . hydrALAZINE  25 mg Per  Tube TID  . insulin aspart  0-9 Units Subcutaneous TID WC  . insulin glargine  10 Units Subcutaneous Daily  . levothyroxine  50 mcg Per Tube QAC breakfast  . magnesium oxide  400 mg Per Tube Daily  . multivitamin with minerals  1 tablet Per Tube Daily  . mupirocin ointment  1 application Nasal BID  . nystatin   Topical BID  . pneumococcal 23 valent vaccine  0.5 mL Intramuscular Tomorrow-1000  . psyllium  1 packet Per Tube Daily  . traZODone  50 mg Oral QHS   Continuous: . cefTRIAXone (ROCEPHIN)  IV Stopped (11/18/16 2310)   YCX:[id number] **OR** acetaminophen, hydrALAZINE, LORazepam  Assesment:She was admitted with urinary tract infection. She had altered mental status from that. She has diabetes which was uncontrolled. She had acute renal failure and that is doing better. She had hyponatremia that is doing better. Her mental status is much improved. She is still very weak and it is recommended that she have rehabilitation at a skilled care facility. I think she's ready for rehabilitation now. She has dementia at baseline but is back to her baseline mental status at this point. Principal Problem:   UTI (urinary tract infection) Active Problems:   Diabetes mellitus without complication (HCC)   ARF (acute renal failure) ([place])   Hyperglycemia   Hyponatremia    Plan: Transfer to skilled care facility when bed available    LOS: 4 days   Kyson Kupper L 11/19/2016, 8:35 AM

## 2016-11-19 NOTE — Progress Notes (Signed)
Physical Therapy Treatment Patient Details Name: Regina Bush MRN: [id number] DOB: 09-04-1927 Today's Date: 11/19/2016    History of Present Illness 83 yo white female from [facility], recently moved here with husband form [place] in later 2017 after he could no longer care for her due to dementia, but husband just passed away. Has UTI and confusion, noted cardiomegaly on imaging.    PMH: dementia, UTI, CAD, DM, CVA, SDH (unclear chronicity), FTT, old proximal tibial ORIF, WC mobility level, HTN, malnutrition, stroke,     PT Comments    Patient demonstrates increased tolerance for taking steps, able to transfer to [facility] to urinate, limited for standing/taking steps due BLE weakness and c/o fatigue.  Patient will benefit from continued physical therapy in hospital and recommended venue below to increase strength, balance, endurance for safe ADLs and gait.   Follow Up Recommendations  SNF     Equipment Recommendations  None recommended by PT    Recommendations for Other Services       Precautions / Restrictions Precautions Precautions: Fall Restrictions Weight Bearing Restrictions: No    Mobility  Bed Mobility Overal bed mobility: Needs Assistance Bed Mobility: Supine to Sit;Sit to Supine     Supine to sit: Mod assist Sit to supine: Mod assist      Transfers Overall transfer level: Needs assistance Equipment used: Rolling walker (2 wheeled);1 person hand held assist Transfers: Sit to/from [facility] Sit to Stand: Mod assist;Max assist Stand pivot transfers: Mod assist;Max assist          Ambulation/Gait Ambulation/Gait assistance: Max assist Ambulation Distance (Feet): 4 Feet Assistive device: Rolling walker (2 wheeled) Gait Pattern/deviations: Decreased step length - right;Decreased step length - left;Decreased stride length Gait velocity: reduced   General Gait Details: Patient limited to steps to transfer to [place]             Wheelchair Mobility    Modified Rankin (Stroke Patients Only)       Balance Overall balance assessment: Needs assistance Sitting-balance support: Feet supported;Bilateral upper extremity supported Sitting balance-Leahy Scale: Fair     Standing balance support: Bilateral upper extremity supported Standing balance-Leahy Scale: Poor                              Cognition Arousal/Alertness: Awake/alert Behavior During Therapy: WFL for tasks assessed/performed Overall Cognitive Status: Within Functional Limits for tasks assessed                                        Exercises General Exercises - Lower Extremity Ankle Circles/Pumps: Both;AROM;Seated;10 reps Long Arc Quad: Both;AROM;10 reps;Seated Hip ABduction/ADduction: Both;AROM;10 reps;Seated Heel Raises: AROM;Both;Seated;10 reps    General Comments        Pertinent Vitals/Pain Pain Assessment: No/denies pain    Home Living Family/patient expects to be discharged to:: Assisted living             Home Equipment: Wheelchair - manual      Prior Function Level of Independence: Needs assistance  Gait / Transfers Assistance Needed: RW to transfer, wc mobility ADL's / Homemaking Assistance Needed: assistance for dressing and bathing     PT Goals (current goals can now be found in the care plan section) Acute Rehab PT Goals Patient Stated Goal: Return home PT Goal Formulation: With patient Time For Goal  Achievement: 12/02/16 Potential to Achieve Goals: Good Progress towards PT goals: Progressing toward goals    Frequency           PT Plan Current plan remains appropriate    Co-evaluation              AM-PAC PT "6 Clicks" Daily Activity  Outcome Measure  Difficulty turning over in bed (including adjusting bedclothes, sheets and blankets)?: Unable Difficulty moving from lying on back to sitting on the side of the bed? : Unable Difficulty sitting down on and  standing up from a chair with arms (e.g., wheelchair, bedside commode, etc,.)?: Unable Help needed moving to and from a bed to chair (including a wheelchair)?: A Lot Help needed walking in hospital room?: Total Help needed climbing 3-5 steps with a railing? : Total 6 Click Score: 7    End of Session Equipment Utilized During Treatment: Gait belt Activity Tolerance: Patient limited by fatigue;Patient tolerated treatment well Patient left: in bed;with call bell/phone within reach;with bed alarm set Nurse Communication: Mobility status PT Visit Diagnosis: Unsteadiness on feet (R26.81);Other abnormalities of gait and mobility (R26.89);Muscle weakness (generalized) (M62.81)      Time: 6270-3500 PT Time Calculation (min) (ACUTE ONLY): 29 min  Charges:  $Therapeutic Activity: 23-37 mins                    G Codes:       1:22 PM, 2016-12-07 Lonell Grandchild, MPT Physical Therapist with [facility] [phone] office [phone] mobile phone

## 2016-11-19 NOTE — Progress Notes (Signed)
This is an acute visit.  Level care skilled.  Facility is [facility].  Chief complaint-acute visit status post hospitalization for UTI-- elevated blood sugar with history of diabetes type 2 also follow-up of medical issues including acute renal failure and hyponatremia dementia.  History of present illness.  Patient is an 83 year old female who was an an assisted living facility who came to the ER with a presumed urinary tract infection and altered mental status.  She was started on Rocephin and improved she grew out Escherichia coli which was pansensitive.  Urology consult was obtained because she has had numerous urinary tract infections and decision was made to put her on suppressive therapy after she finishes her current antibiotic.  She has been discharged on Keflex.  Orders for trimethoprim when the Keflex is completed.  She also was found to have an elevated blood sugar of over 500.  Adjustments were made she is been discharged on Lantus 12 units daily at bedtime as well as sliding scale insulin hemoglobin A1c was 12.0 during her hospitalization.  She also has a history of hypertension she is on Norvasc and hydralazine blood pressure today appears stable at 134/15.  Also has a history of thyroidism I note she is on Synthroid  I note in hospital at one point her sodium was 129 on September 3 however on September 4 had normalized at 137-creatinine also was 1.19 it had been 1.74 back on September 3.  She also apparently has a history of low magnesium she is on mag oxide.  Regards to dementia this will have to be monitored she is not on any medications currently.  She also recently lost her husband of over 56 years-and per nursing staff she is somewhat upset about this fact tonight she says she would like to go home  Past Medical History:  Diagnosis Date  . Anemia   . Arthritis   . Cerebral infarction ([place])   . Coronary artery disease   . Dementia   . Diabetes  mellitus without complication (HCC)    Type 2  . Dysphagia due to recent cerebral infarction   . Failure to thrive (0-17)   . Hypercalcemia   . Hypertension   . Pneumonitis   . Protein calorie malnutrition ([place])   . Subdural hematoma ([place])   . Thyroid disease       History reviewed. No pertinent surgical history.    Social History:         Social History  Substance Use Topics  . Smoking status: Unknown If Ever Smoked  . Smokeless tobacco: Never Used  . Alcohol use No     Lives - at [facility]        Family History :    Unable to obtain secondary to dementia    Review of systems-Difficult to obtain secondary to dementia nursing has not reported any acute issues so far again she has just arrived at facility area She is indicating she like to go home. Does not really want to be here  Physical exam.  Temp there is 96.8 pulse 62 respirations 17 blood pressure 134/50 O2 saturation is 96% on room air.   In general this is a somewhat frail elderly female in no distress but does have somewhat of a sad affect saying she would like to go home.  Her skin is warm and dry she does have a small abrasion it appears on her right upper foot-also pale erythematous rash perineal area.  Eyes--sclerae and  conjunctivae are clear visual acuity appears grossly intact.  Oropharynx is clear mucous membranes appear fairly moist.  Chest is clear to auscultation there is no labored breathing.  Heart is regular rate and rhythm without murmur gallop or rub she does not have significant lower extremity edema pedal pulses are palpable bilaterally.  Abdomen is soft she has several well-healed surgical scars bowel sounds are positive abdomen appears to be nontender.  GU again she does have the pale erythematous rash perineal area could not really appreciate suprapubic tenderness.  Muscle skeletal limited exam since she is in bed but is able to move it appears all  extremities 4.  Neurologic is grossly intact her speech is clear could not really appreciate lateralizing findings.  Psych does have history of dementia difficult to fully assess since patient is somewhat fixated on liking to go home-has somewhat of a sad affect somewhat tearful.                   Labs.  11/16/2016.  Sodium 137 potassium 3.8 BUN 35 creatinine 1.19.  AST 10 ALT 12 albumin 3.0.  Of note creatinine on September 3 was 1.74 with a BUN of 44 sodium was 129 potassium was 5.2.  11/16/2016.  WBC 8.4 hemoglobin 11.8 platelets 206.  11/15/2016.  Hemoglobin A1c was 12.0.  Assessment plan.  #1 UTI is completing a course of Keflex orders to initiate trimethoprim for prophylaxis after Keflex is completed-she is afebrile currently appears to be stable in this regards.  #2 history diabetes type 2 with significant elevated blood sugar on admission hospital blood sugar on admission daycare was 297 she is on Lantus 12 units daily at bedtime as well as sliding scale insulin at this point will monitor CBGs and make adjustments accordingly.  #3 history of hypertension she is on Norvasc and hydralazine at this point appears relatively stable although we have minimal readings will monitor for now.  #4 history hypothyroidism she is on Synthroid I do not see a recent TSH will order this.  #5 history of acute renal insufficiency with a creatinine elevated initially in hospital this appeared to have improved significantly in comparison labs from September 3 2 September 4-will update this to ensure stability.  #6 history of hyperkalemia this appears transitory most likely due to renal insufficiency which appears to have improved again will update a lab.  #7 history of hyponatremia this appears transitory as well with a sodium dropping to 129 and then rising to normal range the next day will update this.  #8-history of low magnesium? She is on magnesium supplementation  Will update a magnesium level as well. Magnesium level done in February 2018 appear to be normal at 2.2.    #9 history of hyperlipidemia she is on Lipitor--will obtain a fasting liver panel as well for follow up  #10-history of insomnia she is on trazodone at night.   #11 history of vitamin D deficiency? She is on supplementation Will update a level  #12 history CVA she continues on aspirin as well as a statin  #13 history of dementia at this point continue supportive care she does have somewhat of a sad tearful affect today this will have to be monitored consider adding an antidepressant if this continues-apparently she ate quite well at supper tonight which is encouraging she was cooperative with exam but did indicate she would really like to go home.  #14 history of perineal rash she does have an order for nystatin.  Again will update labs tomorrow including a CBC-metabolic panel to keep an eye on her renal issues also will obtain a fasting lipid panel magnesium level to keep an eye on her lipids magnesium and also will obtain a TSH since she apparently has a history hypothyroidism and is on Synthroid.--And we'll check her vitamin D level as well  [id number]-of note greater than 40 minutes spent assessing patient-reviewing her chart-reviewing her labs-and coordinating and form relating a care for numerous diagnoses-no greater than 50% of time spent coordinating plan of care

## 2016-11-19 NOTE — Care Management Note (Signed)
Case Management Note  Patient Details  Name: Clifford Coudriet MRN: [id number] Date of Birth: [birth year]  Admitted with UTI, from [place]. PT has recommended SNF, CSW making arrangements.   Expected Discharge Date:  11/19/16               Expected Discharge Plan:  [place]  In-House Referral:  Clinical Social Work  Discharge planning Services  CM Consult  Post Acute Care Choice:  NA Choice offered to:  NA  Status of Service:  Completed, signed off  Sherald Barge, RN 11/19/2016, 9:49 AM

## 2016-11-19 NOTE — Progress Notes (Signed)
Report given to Renae T., LPN  At [facility].  Pt will be admitted there to room 131.

## 2016-11-19 NOTE — Care Management Important Message (Signed)
Important Message  Patient Details  Name: Regina Bush MRN: [id number] Date of Birth: [birth year]   Medicare Important Message Given:  Yes    Sherald Barge, RN 11/19/2016, 9:49 AM

## 2016-11-19 NOTE — Clinical Social Work Note (Signed)
Patient recommended for SNF. LCSW faxed patient's clinical information to facilities requested.      Lexton Hidalgo, Clydene Pugh, LCSW

## 2016-11-19 NOTE — Discharge Summary (Signed)
Physician Discharge Summary  Patient ID: Regina Bush MRN: [id number] DOB/AGE: 1928/01/07 83 y.o. Primary Care Physician:Dell Hurtubise, Percell Miller, MD Admit date: 11/15/2016 Discharge date: 11/19/2016    Discharge Diagnoses:   Principal Problem:   UTI (urinary tract infection) Active Problems:   Diabetes mellitus without complication ([place])   ARF (acute renal failure) (HCC)   Hyperglycemia   Hyponatremia Metabolic encephalopathy secondary to UTI Hypertension Dementia Deconditioning Allergies as of 11/19/2016      Reactions   Cymbalta [duloxetine Hcl] Other (See Comments)   unknown   Effexor [venlafaxine] Other (See Comments)   unknown   Morphine And Related Other (See Comments)   unknown   Tape    adhesive      Medication List    TAKE these medications   acetaminophen 325 MG tablet Commonly known as:  TYLENOL Take 2 tablets (650 mg total) by mouth every 6 (six) hours as needed for mild pain or moderate pain.   amLODipine 10 MG tablet Commonly known as:  NORVASC Place 10 mg into feeding tube daily.   aspirin EC 81 MG tablet Take 81 mg by mouth daily.   atorvastatin 20 MG tablet Commonly known as:  LIPITOR Place 20 mg into feeding tube daily.   BENEFIBER PO 10 mLs by Gastric Tube route daily.   cephALEXin 250 MG capsule Commonly known as:  KEFLEX Take 1 capsule (250 mg total) by mouth 4 (four) times daily.   cholecalciferol 1000 units tablet Commonly known as:  VITAMIN D Place 1,000 Units into feeding tube daily.   famotidine 20 MG tablet Commonly known as:  PEPCID Place 20 mg into feeding tube daily.   fluticasone 50 MCG/ACT nasal spray Commonly known as:  FLONASE Place 2 sprays into both nostrils daily.   hydrALAZINE 25 MG tablet Commonly known as:  APRESOLINE Place 25 mg into feeding tube 3 (three) times daily.   insulin aspart 100 UNIT/ML injection Commonly known as:  novoLOG Inject 0-9 Units into the skin 3 (three) times daily with meals.   insulin  glargine 100 UNIT/ML injection Commonly known as:  LANTUS Inject 0.12 mLs (12 Units total) into the skin daily.   levothyroxine 50 MCG tablet Commonly known as:  SYNTHROID, LEVOTHROID Place 50 mcg into feeding tube daily before breakfast.   magnesium oxide 400 MG tablet Commonly known as:  MAG-OX Place 400 mg into feeding tube daily.   multivitamin with minerals Tabs tablet Place 1 tablet into feeding tube daily.   nystatin powder Commonly known as:  MYCOSTATIN/NYSTOP Apply topically 2 (two) times daily as needed (for redness).   traZODone 50 MG tablet Commonly known as:  DESYREL Take 50 mg by mouth at bedtime.            Discharge Care Instructions        Start     Ordered   11/19/16 0000  insulin aspart (NOVOLOG) 100 UNIT/ML injection  3 times daily with meals     11/19/16 0841   11/19/16 0000  insulin glargine (LANTUS) 100 UNIT/ML injection  Daily     11/19/16 0841   11/19/16 0000  Increase activity slowly     11/19/16 0841   11/19/16 0000  Diet - low sodium heart healthy     11/19/16 0841   11/19/16 0000  cephALEXin (KEFLEX) 250 MG capsule  4 times daily     11/19/16 0841      Discharged Condition:Improved    Consults: Urology  Significant Diagnostic Studies: Dg Chest 2  View  Result Date: 11/15/2016 CLINICAL DATA:  Chest pain and weakness. EXAM: CHEST  2 VIEW COMPARISON:  08/24/2016 and 08/19/2016 radiographs FINDINGS: Cardiomegaly and mild interstitial prominence again noted. There is no evidence of focal airspace disease, pulmonary edema, suspicious pulmonary nodule/mass, pleural effusion, or pneumothorax. No acute bony abnormalities are identified. IMPRESSION: No evidence of acute cardiopulmonary disease. Cardiomegaly and chronic interstitial prominence. Electronically Signed   By: Margarette Canada M.D.   On: 11/15/2016 19:27   Dg Shoulder Right  Result Date: 11/15/2016 CLINICAL DATA:  Fall, pain in the shoulders EXAM: RIGHT SHOULDER - 2+ VIEW COMPARISON:   None. FINDINGS: No fracture or dislocation. Mild inferior glenohumeral degenerative change. Narrowing of the subacromial space. Right lung apex is clear IMPRESSION: 1. No fracture or dislocation 2. Narrowed appearance of the subacromial space suggesting rotator cuff pathology Electronically Signed   By: Donavan Foil M.D.   On: 11/15/2016 23:26   Ct Head Wo Contrast  Result Date: 11/15/2016 CLINICAL DATA:  83 y/o  F; altered mental status. EXAM: CT HEAD WITHOUT CONTRAST TECHNIQUE: Contiguous axial images were obtained from the base of the skull through the vertex without intravenous contrast. COMPARISON:  None. FINDINGS: Brain: Small chronic cortical infarcts in right posterior frontal lobe, right parietal lobe, in chronic infarction of the right lateral temporal lobe. Small chronic lacunar infarct within left cerebellar hemisphere. Moderate chronic microvascular ischemic changes of white matter parenchymal volume loss of the brain. No large acute infarct, intracranial hemorrhage, or focal mass effect. No hydrocephalus or effacement of basilar cisterns. No extra-axial collection identified. Vascular: Calcific atherosclerosis of carotid siphons. Skull: Normal. Negative for fracture or focal lesion. Sinuses/Orbits: Bilateral mastoid effusions. Normal aeration of paranasal sinuses. Bilateral intra-ocular lens replacement. Other: 12 mm nodule in the left occipital region scalp, direct visualization recommended. IMPRESSION: 1. No acute intracranial abnormality identified. 2. 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 3. Moderate for age chronic microvascular ischemic changes and parenchymal volume loss of the brain. 4. Bilateral mastoid effusions. 5. 12 mm nodule left suboccipital scalp, direct visualization recommended, probably sebaceous cyst. Electronically Signed   By: Kristine Garbe M.D.   On: 11/15/2016 23:10   US  Renal  Result Date: 11/16/2016 CLINICAL DATA:  Acute renal failure EXAM: RENAL / URINARY TRACT ULTRASOUND COMPLETE COMPARISON:  None. FINDINGS: Right Kidney: Length: 11.1 cm. Echogenicity within normal limits. No solid mass or hydronephrosis visualized. Anechoic mass in the upper pole of the right kidney measuring 2.7 x 1.6 x 1.9 cm. Anechoic mass in the inferior pole of the right kidney measuring 5.7 x 4.6 x 4 cm. Left Kidney: Length: 12.6 cm. Echogenicity within normal limits. No solid mass or hydronephrosis visualized. 2.5 x 1.7 x 2 cm anechoic left inferior pole renal mass most consistent with a cyst. 1.9 x 1.3 x 2.0 cm anechoic midpole left renal mass most consistent with a cyst. 1.6 cm anechoic upper pole renal mass most consistent with a cyst. Bladder: Appears normal for degree of bladder distention. IMPRESSION: 1. No obstructive uropathy. 2. Bilateral renal cysts. Electronically Signed   By: Kathreen Devoid   On: 11/16/2016 11:01   Dg Shoulder Left  Result Date: 11/15/2016 CLINICAL DATA:  Fall, history of arthritis EXAM: LEFT SHOULDER - 2+ VIEW COMPARISON:  None. FINDINGS: No acute displaced fracture or malalignment. AC joint degenerative change and minimal inferior glenohumeral degenerative change. IMPRESSION: Minimal degenerative changes.  No acute osseous abnormality Electronically Signed  By: Donavan Foil M.D.   On: 11/15/2016 23:25    Lab Results: Basic Metabolic Panel: No results for input(s): NA, K, CL, CO2, GLUCOSE, BUN, CREATININE, CALCIUM, MG, PHOS in the last 72 hours. Liver Function Tests: No results for input(s): AST, ALT, ALKPHOS, BILITOT, PROT, ALBUMIN in the last 72 hours.   CBC: No results for input(s): WBC, NEUTROABS, HGB, HCT, MCV, PLT in the last 72 hours.  Recent Results (from the past 240 hour(s))  Urine culture     Status: Abnormal   Collection Time: 11/15/16  8:42 PM  Result Value Ref Range Status   Specimen Description URINE, CLEAN CATCH  Final   Special Requests  NONE  Final   Culture >=100,000 COLONIES/mL ESCHERICHIA COLI (A)  Final   Report Status 11/18/2016 FINAL  Final   Organism ID, Bacteria ESCHERICHIA COLI (A)  Final      Susceptibility   Escherichia coli - MIC*    AMPICILLIN <=2 SENSITIVE Sensitive     CEFAZOLIN <=4 SENSITIVE Sensitive     CEFTRIAXONE <=1 SENSITIVE Sensitive     CIPROFLOXACIN <=0.25 SENSITIVE Sensitive     GENTAMICIN <=1 SENSITIVE Sensitive     IMIPENEM <=0.25 SENSITIVE Sensitive     NITROFURANTOIN <=16 SENSITIVE Sensitive     TRIMETH/SULFA <=20 SENSITIVE Sensitive     AMPICILLIN/SULBACTAM <=2 SENSITIVE Sensitive     PIP/TAZO <=4 SENSITIVE Sensitive     Extended ESBL NEGATIVE Sensitive     * >=100,000 COLONIES/mL ESCHERICHIA COLI  MRSA PCR Screening     Status: Abnormal   Collection Time: 11/16/16  3:51 AM  Result Value Ref Range Status   MRSA by PCR POSITIVE (A) NEGATIVE Final    Comment:        The GeneXpert MRSA Assay (FDA approved for NASAL specimens only), is one component of a comprehensive MRSA colonization surveillance program. It is not intended to diagnose MRSA infection nor to guide or monitor treatment for MRSA infections. RESULT CALLED TO, READ BACK BY AND VERIFIED WITH: HAIGHT Z. AT [id number] ON [id number] BY THOMPSON S.      Hospital Course: This is an 6 year old who is a resident of an assisted living facility and who came to the emergency department with presumed urinary tract infection and altered mental status. She was started on Rocephin and improved. She grew Escherichia coli which was pansensitive. Because she's had multiple urinary tract infections urology consultation was obtained and it was elected to put her on suppressive therapy after she finishes her current antibiotic. She had some confusion during her hospitalization which improved over time. She was very weak and had PT consultation and it was recommended that she have a stay at a skilled care facility for rehabilitation and then return to  her assisted living facility. She was found to have very high blood sugars which had been doing pretty well at the assisted living facility and she is going to be discharged to the skilled care facility on insulin but that may be able to be changed to oral medications as she improves.  Discharge Exam: Blood pressure (!) 140/46, pulse 70, temperature 97.8 F (36.6 C), temperature source Oral, resp. rate 20, weight 70.1 kg (154 lb 9 oz), SpO2 95 %. She is awake and alert. Chest is clear. Heart is regular.  Disposition: To skilled care facility. She will take 10 days of Keflex 250 mg 4 times a day then she will take trimethoprim 100 mg daily indefinitely. She will be on a carb  modified diet. She will be treated with facility protocol sliding scale insulin sensitive with before meals and at bedtime Accu-Cheks. She will receive 12 units of Lantus at bedtime. She will have PT OT and speech as needed. Discharge Instructions    Diet - low sodium heart healthy    Complete by:  As directed    Increase activity slowly    Complete by:  As directed         Signed: Hokulani Rogel L   11/19/2016, 8:42 AM

## 2016-11-19 NOTE — Progress Notes (Signed)
Present with Regina Bush for spiritual and emotional support in light of her husband of 29 years' recent health decline and death. She was somewhat receptive in discussing her support as she stated this was very hard. Underneath expressions of anger and hurt directed towards her daughter and son, I sensed her acute grief due to loss--husband. She couldn't go far in the discussion about her grief and we talked about what might be helpful during this painful time--her faith and that presence of her family. She did want prayer and we prayed that she might not feel alone at this time, for comfort and healing.

## 2016-11-19 NOTE — NC FL2 (Signed)
[place] LEVEL OF CARE SCREENING TOOL     IDENTIFICATION  Patient Name: Regina Bush Birthdate: 1927/05/23 Sex: female Admission Date (Current Location): 11/15/2016  [employer] and [place] Number:  [employer] and Address:  [facility] [address], [place]      Provider Number: [id number]  Attending Physician Name and Address:  Sinda Du, MD  Relative Name and Phone Number:       Current Level of Care: Hospital Recommended Level of Care: [place] Prior Approval Number:    Date Approved/Denied:   PASRR Number: [id number] ([id number])  Discharge Plan: SNF    Current Diagnoses: Patient Active Problem List   Diagnosis Date Noted  . UTI (urinary tract infection) 11/15/2016  . ARF (acute renal failure) ([place]) 11/15/2016  . Hyperglycemia 11/15/2016  . Hyponatremia 11/15/2016  . HCAP (healthcare-associated pneumonia) 08/19/2016  . Subdural hematoma ([place])   . Hypertension   . Diabetes mellitus without complication ([place])   . Coronary artery disease     Orientation RESPIRATION BLADDER Height & Weight     Self  Normal Continent Weight: 154 lb 9 oz (70.1 kg) Height:     BEHAVIORAL SYMPTOMS/MOOD NEUROLOGICAL BOWEL NUTRITION STATUS      Continent  (Carb modified, low sodium, heart healthy)  AMBULATORY STATUS COMMUNICATION OF NEEDS Skin   Extensive Assist Verbally Normal                       Personal Care Assistance Level of Assistance  Bathing, Dressing, Feeding Bathing Assistance: Maximum assistance Feeding assistance: Limited assistance Dressing Assistance: Maximum assistance     Functional Limitations Info  Sight, Hearing, Speech Sight Info: Adequate Hearing Info: Adequate Speech Info: Adequate    SPECIAL CARE FACTORS FREQUENCY  PT (By licensed PT)     PT Frequency: 5x/week              Contractures Contractures Info: Not present    Additional Factors Info  Code Status,  Allergies, Psychotropic Code Status Info: DNR Allergies Info: Cymbalta, Effexor, Morphine and Related, Tape Psychotropic Info: Desyrel         Current Medications (11/19/2016):  This is the current hospital active medication list Current Facility-Administered Medications  Medication Dose Route Frequency Provider Last Rate Last Dose  . acetaminophen (TYLENOL) tablet 650 mg  650 mg Oral Q6H PRN Jani Gravel, MD   650 mg at 11/19/16 1601   Or  . acetaminophen (TYLENOL) suppository 650 mg  650 mg Rectal Q6H PRN Jani Gravel, MD      . amLODipine (NORVASC) tablet 10 mg  10 mg Per Tube Daily Jani Gravel, MD   10 mg at 11/18/16 1043  . aspirin EC tablet 81 mg  81 mg Oral Daily Jani Gravel, MD   81 mg at 11/18/16 1043  . atorvastatin (LIPITOR) tablet 20 mg  20 mg Per Tube Daily Jani Gravel, MD   20 mg at 11/18/16 1043  . cefTRIAXone (ROCEPHIN) 1 g in dextrose 5 % 50 mL IVPB  1 g Intravenous Q24H Jani Gravel, MD   Stopped at 11/18/16 2310  . Chlorhexidine Gluconate Cloth 2 % PADS 6 each  6 each Topical Q0600 Jani Gravel, MD   6 each at 11/18/16 [phone]  . cholecalciferol (VITAMIN D) tablet 400 Units  400 Units Oral Daily Sinda Du, MD   400 Units at 11/18/16 1043  . famotidine (PEPCID) tablet 20 mg  20 mg Per  Tube Daily Jani Gravel, MD   20 mg at 11/18/16 1042  . fluticasone (FLONASE) 50 MCG/ACT nasal spray 2 spray  2 spray Each Nare Daily Jani Gravel, MD   2 spray at 11/18/16 1042  . heparin injection 5,000 Units  5,000 Units Subcutaneous Q8H Jani Gravel, MD   5,000 Units at 11/19/16 [phone]  . hydrALAZINE (APRESOLINE) injection 5 mg  5 mg Intravenous Q6H PRN Jani Gravel, MD      . hydrALAZINE (APRESOLINE) tablet 25 mg  25 mg Per Tube TID Jani Gravel, MD   25 mg at 11/18/16 2230  . insulin aspart (novoLOG) injection 0-9 Units  0-9 Units Subcutaneous TID WC Sinda Du, MD   3 Units at 11/19/16 [phone]  . insulin glargine (LANTUS) injection 10 Units  10 Units Subcutaneous Daily Sinda Du, MD   10 Units at  11/18/16 1040  . levothyroxine (SYNTHROID, LEVOTHROID) tablet 50 mcg  50 mcg Per Tube QAC breakfast Jani Gravel, MD   50 mcg at 11/19/16 0747  . LORazepam (ATIVAN) tablet 0.5 mg  0.5 mg Oral TID PRN Sinda Du, MD   0.5 mg at 11/19/16 5009  . magnesium oxide (MAG-OX) tablet 400 mg  400 mg Per Tube Daily Jani Gravel, MD   400 mg at 11/16/16 1000  . multivitamin with minerals tablet 1 tablet  1 tablet Per Tube Daily Jani Gravel, MD   1 tablet at 11/16/16 1000  . mupirocin ointment (BACTROBAN) 2 % 1 application  1 application Nasal BID Jani Gravel, MD   1 application at 38/18/29 2231  . nystatin (MYCOSTATIN/NYSTOP) topical powder   Topical BID Jani Gravel, MD      . pneumococcal 23 valent vaccine (PNU-IMMUNE) injection 0.5 mL  0.5 mL Intramuscular Tomorrow-1000 Jani Gravel, MD      . psyllium (HYDROCIL/METAMUCIL) packet 1 packet  1 packet Per Tube Daily Jani Gravel, MD   1 packet at 11/18/16 1000  . traZODone (DESYREL) tablet 50 mg  50 mg Oral Elana Alm, MD   50 mg at 11/18/16 2230     Discharge Medications: Please see discharge summary for a list of discharge medications.  Relevant Imaging Results:  Relevant Lab Results:   Additional Information SSN [id number], Clydene Pugh, LCSW

## 2016-11-20 ENCOUNTER — Encounter (HOSPITAL_COMMUNITY)
Admission: RE | Admit: 2016-11-20 | Discharge: 2016-11-20 | Disposition: A | Discharge status: Home / Self Care | Payer: Medicare Other | Source: Skilled Nursing Facility | Attending: Internal Medicine | Admitting: Internal Medicine

## 2016-11-20 DIAGNOSIS — E039 Hypothyroidism, unspecified: Secondary | ICD-10-CM | POA: Insufficient documentation

## 2016-11-20 LAB — BASIC METABOLIC PANEL
ANION GAP: 8 (ref 5–15)
BUN: 29 mg/dL — AB (ref 6–20)
CO2: 23 mmol/L (ref 22–32)
Calcium: 9.4 mg/dL (ref 8.9–10.3)
Chloride: 104 mmol/L (ref 101–111)
Creatinine, Ser: 1.17 mg/dL — ABNORMAL HIGH (ref 0.44–1.00)
GFR calc Af Amer: 47 mL/min — ABNORMAL LOW (ref >60)
GFR calc non Af Amer: 41 mL/min — ABNORMAL LOW (ref >60)
GLUCOSE: 314 mg/dL — AB (ref 65–99)
Potassium: 4.2 mmol/L (ref 3.5–5.1)
Sodium: 135 mmol/L (ref 135–145)

## 2016-11-20 LAB — LIPID PANEL
CHOL/HDL RATIO: 3.6 ratio
Cholesterol: 109 mg/dL (ref 0–200)
HDL: 30 mg/dL — ABNORMAL LOW (ref >40)
LDL Cholesterol: 19 mg/dL (ref 0–99)
Triglycerides: 298 mg/dL — ABNORMAL HIGH (ref <150)
VLDL: 60 mg/dL — ABNORMAL HIGH (ref 0–40)

## 2016-11-20 LAB — TSH: TSH: 3.997 u[IU]/mL (ref 0.350–4.500)

## 2016-11-20 LAB — CBC WITH DIFFERENTIAL/PLATELET
Basophils Absolute: 0 10*3/uL (ref 0.0–0.1)
Basophils Relative: 0 %
EOS PCT: 2 %
Eosinophils Absolute: 0.2 10*3/uL (ref 0.0–0.7)
HCT: 39.8 % (ref 36.0–46.0)
Hemoglobin: 12.9 g/dL (ref 12.0–15.0)
LYMPHS ABS: 1.4 10*3/uL (ref 0.7–4.0)
Lymphocytes Relative: 15 %
MCH: 30.5 pg (ref 26.0–34.0)
MCHC: 32.4 g/dL (ref 30.0–36.0)
MCV: 94.1 fL (ref 78.0–100.0)
MONO ABS: 0.8 10*3/uL (ref 0.1–1.0)
MONOS PCT: 8 %
Neutro Abs: 7.2 10*3/uL (ref 1.7–7.7)
Neutrophils Relative %: 75 %
PLATELETS: 247 10*3/uL (ref 150–400)
RBC: 4.23 MIL/uL (ref 3.87–5.11)
RDW: 13.7 % (ref 11.5–15.5)
WBC: 9.6 10*3/uL (ref 4.0–10.5)

## 2016-11-20 LAB — MAGNESIUM: Magnesium: 2 mg/dL (ref 1.7–2.4)

## 2016-11-22 ENCOUNTER — Non-Acute Institutional Stay (SKILLED_NURSING_FACILITY): Payer: Medicare Other | Admitting: Internal Medicine

## 2016-11-22 ENCOUNTER — Encounter: Payer: Self-pay | Admitting: Internal Medicine

## 2016-11-22 DIAGNOSIS — Z8673 Personal history of transient ischemic attack (TIA), and cerebral infarction without residual deficits: Secondary | ICD-10-CM | POA: Diagnosis not present

## 2016-11-22 DIAGNOSIS — E119 Type 2 diabetes mellitus without complications: Secondary | ICD-10-CM | POA: Diagnosis not present

## 2016-11-22 DIAGNOSIS — I10 Essential (primary) hypertension: Secondary | ICD-10-CM

## 2016-11-22 DIAGNOSIS — N179 Acute kidney failure, unspecified: Secondary | ICD-10-CM | POA: Diagnosis not present

## 2016-11-22 DIAGNOSIS — N3001 Acute cystitis with hematuria: Secondary | ICD-10-CM | POA: Diagnosis not present

## 2016-11-22 DIAGNOSIS — E039 Hypothyroidism, unspecified: Secondary | ICD-10-CM | POA: Diagnosis not present

## 2016-11-22 LAB — VITAMIN D 25 HYDROXY (VIT D DEFICIENCY, FRACTURES): Vit D, 25-Hydroxy: 29.8 ng/mL — ABNORMAL LOW (ref 30.0–100.0)

## 2016-11-22 NOTE — Progress Notes (Signed)
Provider:Kamri Gotsch,Judith Campillo   Location:   [facility] Room Number: 131/P Place of Service:  SNF (31)  PCP: Sinda Du, MD Patient Care Team: Sinda Du, MD as PCP - General (Pulmonary Disease)  Extended Emergency Contact Information Primary Emergency Contact: Knowlton,[name] Address: [address]          [place], [place] [address] Johnnette Litter of [place] Phone: [phone] Mobile Phone: [phone] Relation: Daughter  Code Status: DNR Goals of Care: Advanced Directive information Advanced Directives 11/22/2016  Does Patient Have a Medical Advance Directive? Yes  Type of Advance Directive Out of facility DNR (pink MOST or yellow form)  Does patient want to make changes to medical advance directive? No - Patient declined  Copy of [place] in Chart? -  Would patient like information on creating a medical advance directive? No - Patient declined      Chief Complaint  Patient presents with  . New Admit To SNF    New Admission Visit    HPI: Patient is a 83 y.o. female seen today for admission to SNF for therapy.  Patient has h/o Recurrent UTI, Dementia, Hypertension, Diabetes Type 2, S/P CVA  And  Dysphagia and Subdural Hematoma  Patient is resident of ALF and was admitted to the hospital for Mental status change. She was found to have UTI and was started on Antibiotics. She did grew E Coli and was switched to oral antibiotics. She was discharged to the [facility] for Therapy. With Plan to go to ALF. Patient does not give me any history. She says she knows she had infection. She feels better and wants to go home to [facility]. She started crying while trying to remember her husband and her friends there.  She denied any c/o Fever Chills or dysuria She is walking with the Therapy with the walker and they think she would be able to go to ALF. Past Medical History:  Diagnosis Date  . Anemia   . Arthritis   . Cerebral  infarction ([place])   . Coronary artery disease   . Dementia   . Diabetes mellitus without complication (H[facility])    Type 2  . Dysphagia due to recent cerebral infarction   . Failure to thrive (0-17)   . Hypercalcemia   . Hypertension   . Pneumonitis   . Protein calorie malnutrition ([place])   . Subdural hematoma ([place])   . Thyroid disease    History reviewed. No pertinent surgical history.  has an unknown smoking status. She has never used smokeless tobacco. She reports that she does not drink alcohol or use drugs. Social History   Social History  . Marital status: Married    Spouse name: N/A  . Number of children: N/A  . Years of education: N/A   Occupational History  . Not on file.   Social History Main Topics  . Smoking status: Unknown If Ever Smoked  . Smokeless tobacco: Never Used  . Alcohol use No  . Drug use: No  . Sexual activity: Not on file   Other Topics Concern  . Not on file   Social History Narrative  . No narrative on file    Functional Status Survey:    Family History  Problem Relation Age of Onset  . Family history unknown: Yes    Health Maintenance  Topic Date Due  . FOOT EXAM  12/22/2016 (Originally 07/20/1937)  . OPHTHALMOLOGY EXAM  12/22/2016 (Originally 07/20/1937)  . URINE MICROALBUMIN  12/22/2016 (Originally 07/20/1937)  . DEXA SCAN  12/22/2016 (Originally 07/20/1994)  . INFLUENZA VACCINE  02/12/2017 (Originally 10/13/2016)  . TETANUS/TDAP  02/12/2017 (Originally 07/19/1948)  . PNA vac Low Risk Adult (1 of 2 - PCV13) 02/12/2017 (Originally 07/20/1994)  . HEMOGLOBIN A1C  05/15/2017    Allergies  Allergen Reactions  . Cymbalta [Duloxetine Hcl] Other (See Comments)    unknown  . Effexor [Venlafaxine] Other (See Comments)    unknown  . Morphine And Related Other (See Comments)    unknown  . Tape     adhesive    Outpatient Encounter Prescriptions as of 11/22/2016  Medication Sig  . acetaminophen (TYLENOL) 325 MG tablet Take 2 tablets (650 mg total)  by mouth every 6 (six) hours as needed for mild pain or moderate pain.  Marland Kitchen amLODipine (NORVASC) 10 MG tablet Place 10 mg into feeding tube daily.  Marland Kitchen aspirin EC 81 MG tablet Take 81 mg by mouth daily.  Marland Kitchen atorvastatin (LIPITOR) 20 MG tablet Place 20 mg into feeding tube daily.  . cephALEXin (KEFLEX) 250 MG capsule Take 1 capsule (250 mg total) by mouth 4 (four) times daily.  . cholecalciferol (VITAMIN D) 1000 units tablet Place 1,000 Units into feeding tube daily.  . famotidine (PEPCID) 20 MG tablet Place 20 mg into feeding tube daily.  . fluticasone (FLONASE) 50 MCG/ACT nasal spray Place 2 sprays into both nostrils daily.  . hydrALAZINE (APRESOLINE) 25 MG tablet Place 25 mg into feeding tube 3 (three) times daily.  . insulin aspart (NOVOLOG) 100 UNIT/ML injection Inject 0-9 Units into the skin 3 (three) times daily with meals.  . insulin glargine (LANTUS) 100 UNIT/ML injection Inject 0.12 mLs (12 Units total) into the skin daily.  Marland Kitchen levothyroxine (SYNTHROID, LEVOTHROID) 50 MCG tablet Place 50 mcg into feeding tube daily before breakfast.  . magnesium oxide (MAG-OX) 400 MG tablet Place 400 mg into feeding tube daily.  . Multiple Vitamin (MULTIVITAMIN WITH MINERALS) TABS tablet Place 1 tablet into feeding tube daily.  Marland Kitchen nystatin (MYCOSTATIN/NYSTOP) powder Apply topically 2 (two) times daily as needed (for redness).  . traZODone (DESYREL) 50 MG tablet Take 50 mg by mouth at bedtime.  Marland Kitchen trimethoprim (TRIMPEX) 100 MG tablet Take 100 mg by mouth daily.  . Wheat Dextrin (BENEFIBER PO) 10 mLs by Gastric Tube route daily.   No facility-administered encounter medications on file as of 11/22/2016.      Review of Systems  Review of Systems  Constitutional: Negative for activity change, appetite change, chills, diaphoresis, fatigue and fever.  HENT: Negative for mouth sores, postnasal drip, rhinorrhea, sinus pain and sore throat.   Respiratory: Negative for apnea, cough, chest tightness, shortness of  breath and wheezing.   Cardiovascular: Negative for chest pain, palpitations and leg swelling.  Gastrointestinal: Negative for abdominal distention, abdominal pain, constipation, diarrhea, nausea and vomiting.  Genitourinary: Negative for dysuria and frequency.  Musculoskeletal: Negative for arthralgias, joint swelling and myalgias.  Skin: Negative for rash.  Neurological: Negative for dizziness, syncope, weakness, light-headedness and numbness.  Psychiatric/Behavioral: Negative for behavioral problems, confusion and sleep disturbance.     Vitals:   11/22/16 1551  BP: 123/64  Pulse: 60  Resp: 18  Temp: 98.2 F (36.8 C)   There is no height or weight on file to calculate BMI. Physical Exam  Constitutional: She appears well-developed and well-nourished.  HENT:  Head: Normocephalic.  Mouth/Throat: Oropharynx is clear and moist.  Eyes: Pupils are equal, round, and reactive to light.  Neck: Neck  supple.  Cardiovascular: Normal rate and normal heart sounds.   No murmur heard. Pulmonary/Chest: Effort normal and breath sounds normal. No respiratory distress. She has no wheezes. She has no rales.  Abdominal: Soft. Bowel sounds are normal. She exhibits no distension. There is no tenderness. There is no rebound.  Musculoskeletal: She exhibits no edema.  Neurological: She is alert.  She thought she is in [place]. And did not knew the Date or year.  Skin: Skin is warm and dry.  Patient has fungal  rash in Abdomen and under her Breast and Groin area.  Psychiatric: Her speech is rapid and/or pressured. She is agitated. Cognition and memory are impaired. She expresses impulsivity. She exhibits a depressed mood.    Labs reviewed: Basic Metabolic Panel:  Recent Labs  04/23/16 0700 05/12/16 0700  11/15/16 1849 11/16/16 0437 11/20/16 1053  NA 135  --   < > 129* 137 135  K 4.0  --   < > 5.2* 3.8 4.2  CL 100*  --   < > 97* 104 104  CO2 24  --   < > 23 26 23   GLUCOSE 148*  --   < >  552* 205* 314*  BUN 38*  --   < > 44* 35* 29*  CREATININE 1.72*  --   < > 1.74* 1.19* 1.17*  CALCIUM 11.5*  --   < > 9.2 9.2 9.4  MG 2.5* 2.2  --   --   --  2.0  < > = values in this interval not displayed. Liver Function Tests:  Recent Labs  04/23/16 0700 08/19/16 1704 11/16/16 0437  AST 39 13* 10*  ALT 37 14 12*  ALKPHOS 77 95 94  BILITOT 0.5 0.4 0.3  PROT 6.6 7.0 5.8*  ALBUMIN 3.2* 3.6 3.0*   No results for input(s): LIPASE, AMYLASE in the last 8760 hours. No results for input(s): AMMONIA in the last 8760 hours. CBC:  Recent Labs  08/20/16 0550 11/15/16 1849 11/16/16 0437 11/20/16 1053  WBC 9.2 8.9 8.4 9.6  NEUTROABS 6.3 6.2  --  7.2  HGB 10.4* 12.0 11.8* 12.9  HCT 31.9* 35.7* 35.4* 39.8  MCV 93.5 92.0 91.2 94.1  PLT 249 221 206 247   Cardiac Enzymes: No results for input(s): CKTOTAL, CKMB, CKMBINDEX, TROPONINI in the last 8760 hours. BNP: Invalid input(s): POCBNP Lab Results  Component Value Date   HGBA1C 12.0 (H) 11/15/2016   Lab Results  Component Value Date   TSH 3.997 11/20/2016   No results found for: VITAMINB12 No results found for: FOLATE No results found for: IRON, TIBC, FERRITIN  Imaging and Procedures obtained prior to SNF admission: No results found.  Assessment/Plan  Acute cystitis with hematuria Patient is on Cephalexin for 10 days. She is doing well in facility.   Diabetes mellitus  Patient HBA1C was elevated in the Hospital and she was started on Lantus. Her BS are running high in afternoon. Will Continue same dose and monitor. Would be good candidate forOral Hypoglycemics   Essential hypertension BP well controlled on Norvasc  Hypothyroidism TSH was normal in Hospital. Continue same dose  History of CVA Continue on Aspirin and Statin  Chronic renal insufficiency Creat Near Baseline.  Depression Patient is on Trazodone at night.  Dementia D/W therapy patient would be able to go to Assisted living.  Rash Will  treat with Diflucan for few days Continue Nystatin.   Family/ staff Communication:   Labs/tests ordered: Follow up BMP and CBC.  Total time spent in this patient care encounter was 45_ minutes; greater than 50% of the visit spent counseling patient, reviewing records , Labs and coordinating care for problems addressed at this encounter.

## 2016-11-29 ENCOUNTER — Encounter (HOSPITAL_COMMUNITY)
Admission: RE | Admit: 2016-11-29 | Discharge: 2016-11-29 | Disposition: A | Discharge status: Home / Self Care | Payer: Medicare Other | Source: Skilled Nursing Facility | Attending: Internal Medicine | Admitting: Internal Medicine

## 2016-11-29 LAB — BASIC METABOLIC PANEL
ANION GAP: 9 (ref 5–15)
BUN: 37 mg/dL — ABNORMAL HIGH (ref 6–20)
CO2: 25 mmol/L (ref 22–32)
Calcium: 9.6 mg/dL (ref 8.9–10.3)
Chloride: 105 mmol/L (ref 101–111)
Creatinine, Ser: 1.17 mg/dL — ABNORMAL HIGH (ref 0.44–1.00)
GFR calc Af Amer: 47 mL/min — ABNORMAL LOW (ref >60)
GFR, EST NON AFRICAN AMERICAN: 41 mL/min — AB (ref >60)
Glucose, Bld: 121 mg/dL — ABNORMAL HIGH (ref 65–99)
POTASSIUM: 4.1 mmol/L (ref 3.5–5.1)
SODIUM: 139 mmol/L (ref 135–145)

## 2016-11-29 LAB — CBC
HCT: 37.8 % (ref 36.0–46.0)
Hemoglobin: 12.2 g/dL (ref 12.0–15.0)
MCH: 30.4 pg (ref 26.0–34.0)
MCHC: 32.3 g/dL (ref 30.0–36.0)
MCV: 94.3 fL (ref 78.0–100.0)
PLATELETS: 246 10*3/uL (ref 150–400)
RBC: 4.01 MIL/uL (ref 3.87–5.11)
RDW: 13.2 % (ref 11.5–15.5)
WBC: 8.1 10*3/uL (ref 4.0–10.5)

## 2016-12-02 ENCOUNTER — Encounter: Payer: Self-pay | Admitting: Internal Medicine

## 2016-12-02 ENCOUNTER — Non-Acute Institutional Stay (SKILLED_NURSING_FACILITY): Payer: Medicare Other | Admitting: Internal Medicine

## 2016-12-02 DIAGNOSIS — E039 Hypothyroidism, unspecified: Secondary | ICD-10-CM | POA: Diagnosis not present

## 2016-12-02 DIAGNOSIS — N3001 Acute cystitis with hematuria: Secondary | ICD-10-CM | POA: Diagnosis not present

## 2016-12-02 DIAGNOSIS — E119 Type 2 diabetes mellitus without complications: Secondary | ICD-10-CM | POA: Diagnosis not present

## 2016-12-02 DIAGNOSIS — I10 Essential (primary) hypertension: Secondary | ICD-10-CM

## 2016-12-02 NOTE — Progress Notes (Signed)
Location:   [facility] Room Number: 131/P Place of Service:  SNF (31) Provider:  Clydene Fake, MD  Patient Care Team: Virgie Dad, MD as PCP - General (Internal Medicine)  Extended Emergency Contact Information Primary Emergency Contact: Knowlton,[name] Address: [address]          [place], [place] [address] Johnnette Litter of [place] Phone: [phone] Mobile Phone: [phone] Relation: Daughter  Code Status:  DNR Goals of care: Advanced Directive information Advanced Directives 12/02/2016  Does Patient Have a Medical Advance Directive? Yes  Type of Advance Directive Out of facility DNR (pink MOST or yellow form)  Does patient want to make changes to medical advance directive? No - Patient declined  Copy of [place] in Chart? -  Would patient like information on creating a medical advance directive? No - Patient declined     Chief Complaint  Patient presents with  . Discharge Note    Discharge Visit  . Acute Visit    Rash under breast and abdominal area    HPI:  Pt is a 83 y.o. female seen today for an acute visit for rash under Her breast and for Discharge to Assisted facility Patient has h/o Recurrent UTI, Dementia, Hypertension, Diabetes Type 2, S/P CVA  And  Dysphagia and Subdural Hematoma Patient was admitted to SNF for therapy after hospitalization for UTI.  Patient has done very well in therapy. She is now walking with the walker and wheels herself around in the wheelchair. She is very glad to go back to her ALF. She denied any complains. But Nurses have noticed that her rash is lightly better but she continues to have rash under her Breast and on her abdomen. She was given diflucan for fes days . Patient does say her rash itches.     Past Medical History:  Diagnosis Date  . Anemia   . Arthritis   . Cerebral infarction ([facility])   . Coronary artery disease   . Dementia   . Diabetes mellitus  without complication (HCC)    Type 2  . Dysphagia due to recent cerebral infarction   . Failure to thrive (0-17)   . Hypercalcemia   . Hypertension   . Pneumonitis   . Protein calorie malnutrition ([facility])   . Subdural hematoma ([facility])   . Thyroid disease    History reviewed. No pertinent surgical history.  Allergies  Allergen Reactions  . Cymbalta [Duloxetine Hcl] Other (See Comments)    unknown  . Effexor [Venlafaxine] Other (See Comments)    unknown  . Morphine And Related Other (See Comments)    unknown  . Tape     adhesive    Outpatient Encounter Prescriptions as of 12/02/2016  Medication Sig  . acetaminophen (TYLENOL) 325 MG tablet Take 2 tablets (650 mg total) by mouth every 6 (six) hours as needed for mild pain or moderate pain.  Marland Kitchen amLODipine (NORVASC) 10 MG tablet Place 10 mg into feeding tube daily.  Marland Kitchen aspirin EC 81 MG tablet Take 81 mg by mouth daily.  Marland Kitchen atorvastatin (LIPITOR) 20 MG tablet Place 20 mg into feeding tube daily.  . cholecalciferol (VITAMIN D) 1000 units tablet Place 1,000 Units into feeding tube daily.  . famotidine (PEPCID) 20 MG tablet Place 20 mg into feeding tube daily.  . fluticasone (FLONASE) 50 MCG/ACT nasal spray Place 2 sprays into both nostrils daily.  . hydrALAZINE (APRESOLINE) 25 MG tablet Place 25 mg into  feeding tube 3 (three) times daily.  . insulin aspart (NOVOLOG FLEXPEN) 100 UNIT/ML FlexPen Give per sliding scale three times a day  . insulin glargine (LANTUS) 100 UNIT/ML injection Inject 0.12 mLs (12 Units total) into the skin daily.  Marland Kitchen levothyroxine (SYNTHROID, LEVOTHROID) 50 MCG tablet Place 50 mcg into feeding tube daily before breakfast.  . magnesium oxide (MAG-OX) 400 MG tablet Place 400 mg into feeding tube daily.  . Multiple Vitamin (MULTIVITAMIN WITH MINERALS) TABS tablet Place 1 tablet into feeding tube daily.  Marland Kitchen nystatin (MYCOSTATIN/NYSTOP) powder Apply topically 2 (two) times daily as needed (for redness).  . traZODone (DESYREL)  50 MG tablet Take 50 mg by mouth at bedtime.  Marland Kitchen trimethoprim (TRIMPEX) 100 MG tablet Take 100 mg by mouth daily.  . Wheat Dextrin (BENEFIBER PO) 10 mLs by Gastric Tube route daily.  . [DISCONTINUED] insulin aspart (NOVOLOG) 100 UNIT/ML injection Inject 0-9 Units into the skin 3 (three) times daily with meals.   No facility-administered encounter medications on file as of 12/02/2016.      Review of Systems  There is no immunization history for the selected administration types on file for this patient. Pertinent  Health Maintenance Due  Topic Date Due  . FOOT EXAM  12/22/2016 (Originally 07/20/1937)  . OPHTHALMOLOGY EXAM  12/22/2016 (Originally 07/20/1937)  . URINE MICROALBUMIN  12/22/2016 (Originally 07/20/1937)  . DEXA SCAN  12/22/2016 (Originally 07/20/1994)  . INFLUENZA VACCINE  02/12/2017 (Originally 10/13/2016)  . PNA vac Low Risk Adult (1 of 2 - PCV13) 02/12/2017 (Originally 07/20/1994)  . HEMOGLOBIN A1C  05/15/2017   No flowsheet data found. Functional Status Survey:    Vitals:   12/02/16 1137  BP: (!) 155/64  Pulse: (!) 59  Resp: 20  Temp: 97.8 F (36.6 C)  TempSrc: Oral   There is no height or weight on file to calculate BMI. Physical Exam  Constitutional: She appears well-developed and well-nourished.  HENT:  Head: Normocephalic.  Mouth/Throat: Oropharynx is clear and moist.  Eyes: Pupils are equal, round, and reactive to light.  Neck: Neck supple.  Cardiovascular: Normal rate and normal heart sounds.   Pulmonary/Chest: Effort normal and breath sounds normal. No respiratory distress. She has no wheezes.  Abdominal: Soft. Bowel sounds are normal. She exhibits no distension. There is no tenderness.  Musculoskeletal:  Mild edema Bilateral  Neurological: She is alert.  No Focal deficits  Skin:  Erythematous rash Under her breast and abdomen. It looks better then before.    Labs reviewed:  Recent Labs  04/23/16 0700 05/12/16 0700  11/16/16 0437 11/20/16 1053  11/29/16 0650  NA 135  --   < > 137 135 139  K 4.0  --   < > 3.8 4.2 4.1  CL 100*  --   < > 104 104 105  CO2 24  --   < > 26 23 25   GLUCOSE 148*  --   < > 205* 314* 121*  BUN 38*  --   < > 35* 29* 37*  CREATININE 1.72*  --   < > 1.19* 1.17* 1.17*  CALCIUM 11.5*  --   < > 9.2 9.4 9.6  MG 2.5* 2.2  --   --  2.0  --   < > = values in this interval not displayed.  Recent Labs  04/23/16 0700 08/19/16 1704 11/16/16 0437  AST 39 13* 10*  ALT 37 14 12*  ALKPHOS 77 95 94  BILITOT 0.5 0.4 0.3  PROT 6.6 7.0  5.8*  ALBUMIN 3.2* 3.6 3.0*    Recent Labs  08/20/16 0550 11/15/16 1849 11/16/16 0437 11/20/16 1053 11/29/16 0650  WBC 9.2 8.9 8.4 9.6 8.1  NEUTROABS 6.3 6.2  --  7.2  --   HGB 10.4* 12.0 11.8* 12.9 12.2  HCT 31.9* 35.7* 35.4* 39.8 37.8  MCV 93.5 92.0 91.2 94.1 94.3  PLT 249 221 206 247 246   Lab Results  Component Value Date   TSH 3.997 11/20/2016   Lab Results  Component Value Date   HGBA1C 12.0 (H) 11/15/2016   Lab Results  Component Value Date   CHOL 109 11/20/2016   HDL 30 (L) 11/20/2016   LDLCALC 19 11/20/2016   TRIG 298 (H) 11/20/2016   CHOLHDL 3.6 11/20/2016    Significant Diagnostic Results in last 30 days:  Dg Chest 2 View  Result Date: 11/15/2016 CLINICAL DATA:  Chest pain and weakness. EXAM: CHEST  2 VIEW COMPARISON:  08/24/2016 and 08/19/2016 radiographs FINDINGS: Cardiomegaly and mild interstitial prominence again noted. There is no evidence of focal airspace disease, pulmonary edema, suspicious pulmonary nodule/mass, pleural effusion, or pneumothorax. No acute bony abnormalities are identified. IMPRESSION: No evidence of acute cardiopulmonary disease. Cardiomegaly and chronic interstitial prominence. Electronically Signed   By: Margarette Canada M.D.   On: 11/15/2016 19:27   Dg Shoulder Right  Result Date: 11/15/2016 CLINICAL DATA:  Fall, pain in the shoulders EXAM: RIGHT SHOULDER - 2+ VIEW COMPARISON:  None. FINDINGS: No fracture or dislocation. Mild  inferior glenohumeral degenerative change. Narrowing of the subacromial space. Right lung apex is clear IMPRESSION: 1. No fracture or dislocation 2. Narrowed appearance of the subacromial space suggesting rotator cuff pathology Electronically Signed   By: Donavan Foil M.D.   On: 11/15/2016 23:26   Ct Head Wo Contrast  Result Date: 11/15/2016 CLINICAL DATA:  83 y/o  F; altered mental status. EXAM: CT HEAD WITHOUT CONTRAST TECHNIQUE: Contiguous axial images were obtained from the base of the skull through the vertex without intravenous contrast. COMPARISON:  None. FINDINGS: Brain: Small chronic cortical infarcts in right posterior frontal lobe, right parietal lobe, in chronic infarction of the right lateral temporal lobe. Small chronic lacunar infarct within left cerebellar hemisphere. Moderate chronic microvascular ischemic changes of white matter parenchymal volume loss of the brain. No large acute infarct, intracranial hemorrhage, or focal mass effect. No hydrocephalus or effacement of basilar cisterns. No extra-axial collection identified. Vascular: Calcific atherosclerosis of carotid siphons. Skull: Normal. Negative for fracture or focal lesion. Sinuses/Orbits: Bilateral mastoid effusions. Normal aeration of paranasal sinuses. Bilateral intra-ocular lens replacement. Other: 12 mm nodule in the left occipital region scalp, direct visualization recommended. IMPRESSION: 1. No acute intracranial abnormality identified. 2. 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 3. Moderate for age chronic microvascular ischemic changes and parenchymal volume loss of the brain. 4. Bilateral mastoid effusions. 5. 12 mm nodule left suboccipital scalp, direct visualization recommended, probably sebaceous cyst. Electronically Signed   By: Kristine Garbe M.D.   On: 11/15/2016 23:10   US Renal  Result Date: 11/16/2016 CLINICAL DATA:  Acute  renal failure EXAM: RENAL / URINARY TRACT ULTRASOUND COMPLETE COMPARISON:  None. FINDINGS: Right Kidney: Length: 11.1 cm. Echogenicity within normal limits. No solid mass or hydronephrosis visualized. Anechoic mass in the upper pole of the right kidney measuring 2.7 x 1.6 x 1.9 cm. Anechoic mass in the inferior pole of the right kidney measuring 5.7 x 4.6 x 4  cm. Left Kidney: Length: 12.6 cm. Echogenicity within normal limits. No solid mass or hydronephrosis visualized. 2.5 x 1.7 x 2 cm anechoic left inferior pole renal mass most consistent with a cyst. 1.9 x 1.3 x 2.0 cm anechoic midpole left renal mass most consistent with a cyst. 1.6 cm anechoic upper pole renal mass most consistent with a cyst. Bladder: Appears normal for degree of bladder distention. IMPRESSION: 1. No obstructive uropathy. 2. Bilateral renal cysts. Electronically Signed   By: Kathreen Devoid   On: 11/16/2016 11:01   Dg Shoulder Left  Result Date: 11/15/2016 CLINICAL DATA:  Fall, history of arthritis EXAM: LEFT SHOULDER - 2+ VIEW COMPARISON:  None. FINDINGS: No acute displaced fracture or malalignment. AC joint degenerative change and minimal inferior glenohumeral degenerative change. IMPRESSION: Minimal degenerative changes.  No acute osseous abnormality Electronically Signed   By: Donavan Foil M.D.   On: 11/15/2016 23:25    Assessment/Plan  Acute cystitis with hematuria Patient  Got Cephalexin for 10 days.  She is asymptomatic and is now going to be on Trimethoprim.   Diabetes mellitus  Patient HBA1C was elevated in the Hospital and she was started on Lantus. Her BS are running high in afternoon. Her Morning Sugars are b/w 150 -180 Will increase her dose of Lantus to 14 units.  Essential hypertension BP well controlled on Norvasc  Hypothyroidism TSH was normal in Hospital. Continue same dose  History of CVA Continue on Aspirin and Statin  Chronic renal insufficiency Creat Near Baseline.  Depression Patient  is on Trazodone at night.  Dementia Discharge to ALF  Rash Was treated with Diflucan and now will try Lotrisone   Family/ staff Communication:   Labs/tests ordered:   Patient would be discharged to ALF with follow up with her PCP. She will need follow up for her renal function. Total time spent in this patient care encounter was _35 minutes; greater than 50% of the visit spent counseling patient, reviewing records , Labs and coordinating care for discharge .

## 2016-12-03 ENCOUNTER — Emergency Department (HOSPITAL_COMMUNITY)
Admission: EM | Admit: 2016-12-03 | Discharge: 2016-12-03 | Disposition: A | Discharge status: Home / Self Care | Payer: Medicare Other | Attending: Emergency Medicine | Admitting: Emergency Medicine

## 2016-12-03 ENCOUNTER — Encounter (HOSPITAL_COMMUNITY): Payer: Self-pay

## 2016-12-03 DIAGNOSIS — E039 Hypothyroidism, unspecified: Secondary | ICD-10-CM | POA: Diagnosis not present

## 2016-12-03 DIAGNOSIS — R21 Rash and other nonspecific skin eruption: Secondary | ICD-10-CM | POA: Insufficient documentation

## 2016-12-03 DIAGNOSIS — Z79899 Other long term (current) drug therapy: Secondary | ICD-10-CM | POA: Insufficient documentation

## 2016-12-03 DIAGNOSIS — Z7982 Long term (current) use of aspirin: Secondary | ICD-10-CM | POA: Insufficient documentation

## 2016-12-03 DIAGNOSIS — I249 Acute ischemic heart disease, unspecified: Secondary | ICD-10-CM | POA: Insufficient documentation

## 2016-12-03 DIAGNOSIS — Z885 Allergy status to narcotic agent status: Secondary | ICD-10-CM | POA: Insufficient documentation

## 2016-12-03 DIAGNOSIS — E1165 Type 2 diabetes mellitus with hyperglycemia: Secondary | ICD-10-CM | POA: Insufficient documentation

## 2016-12-03 DIAGNOSIS — I10 Essential (primary) hypertension: Secondary | ICD-10-CM | POA: Insufficient documentation

## 2016-12-03 DIAGNOSIS — R739 Hyperglycemia, unspecified: Secondary | ICD-10-CM

## 2016-12-03 DIAGNOSIS — Z8673 Personal history of transient ischemic attack (TIA), and cerebral infarction without residual deficits: Secondary | ICD-10-CM | POA: Insufficient documentation

## 2016-12-03 DIAGNOSIS — Z794 Long term (current) use of insulin: Secondary | ICD-10-CM | POA: Diagnosis not present

## 2016-12-03 LAB — CBG MONITORING, ED
GLUCOSE-CAPILLARY: 353 mg/dL — AB (ref 65–99)
Glucose-Capillary: 280 mg/dL — ABNORMAL HIGH (ref 65–99)

## 2016-12-03 MED ORDER — INSULIN ASPART 100 UNIT/ML ~~LOC~~ SOLN
2.0000 [IU] | Freq: Once | SUBCUTANEOUS | Status: AC
  Administered 2016-12-03: 2 [IU] via SUBCUTANEOUS
  Filled 2016-12-03: qty 1

## 2016-12-03 NOTE — ED Notes (Signed)
Cleaned abrasion to top of r foot, applied telfa and wrapped loosely with gauze.  Instructed [facility] staff to monitor for swelling.  Staff verbalized understanding.

## 2016-12-03 NOTE — ED Provider Notes (Signed)
[facility] DEPT Provider Note   CSN: [id number] Arrival date & time: 12/03/16  1905     History   Chief Complaint Chief Complaint  Patient presents with  . Hyperglycemia    HPI [facility] is a 83 y.o. female.  Patient just discharged today from [facility] to [facility].Apparently they did not have regular insulin to provide her this evening on her sliding scale. Noted blood sugars were high in Center in here for evaluation. Patient without any specific complaints. [facility] report blood sugar 361.      Past Medical History:  Diagnosis Date  . Anemia   . Arthritis   . Cerebral infarction ([facility])   . Coronary artery disease   . Dementia   . Diabetes mellitus without complication (HCC)    Type 2  . Dysphagia due to recent cerebral infarction   . Failure to thrive (0-17)   . Hypercalcemia   . Hypertension   . Pneumonitis   . Protein calorie malnutrition ([facility])   . Subdural hematoma ([place])   . Thyroid disease     Patient Active Problem List   Diagnosis Date Noted  . Hypothyroidism 11/19/2016  . History of CVA in adulthood 11/19/2016  . UTI (urinary tract infection) 11/15/2016  . ARF (acute renal failure) ([facility]) 11/15/2016  . Hyponatremia 11/15/2016  . HCAP (healthcare-associated pneumonia) 08/19/2016  . Subdural hematoma ([place] [facility])   . Hypertension   . Diabetes mellitus without complication ([place])   . Coronary artery disease     History reviewed. No pertinent surgical history.  OB History    No data available       Home Medications    Prior to Admission medications   Medication Sig Start Date End Date Taking? Authorizing Provider  acetaminophen (TYLENOL) 325 MG tablet Take 2 tablets (650 mg total) by mouth every 6 (six) hours as needed for mild pain or moderate pain. 08/25/16  Yes Rosita Fire, MD  amLODipine (NORVASC) 10 MG tablet Take 10 mg by mouth daily.    Yes [provider]  aspirin EC 81 MG tablet Take 81 mg by mouth daily.   Yes  [provider]  atorvastatin (LIPITOR) 20 MG tablet Take 20 mg by mouth daily.    Yes [provider]  cholecalciferol (VITAMIN D) 1000 units tablet Take 1,000 Units by mouth daily.    Yes [provider]  famotidine (PEPCID) 20 MG tablet Take 20 mg by mouth daily.    Yes [provider]  fluticasone (FLONASE) 50 MCG/ACT nasal spray Place 2 sprays into both nostrils daily.   Yes [provider]  hydrALAZINE (APRESOLINE) 25 MG tablet Take 25 mg by mouth 3 (three) times daily.    Yes [provider]  insulin aspart (NOVOLOG FLEXPEN) 100 UNIT/ML FlexPen Inject 2-10 Units into the skin 3 (three) times daily. Give per sliding scale three times a day:  201-250= 2 UNITS 251-300= 4 UNITS 301-350= 6 UNITS 351-400= 8 UNITS 401-450=10UNITS   Yes [provider]  insulin glargine (LANTUS) 100 UNIT/ML injection Inject 0.12 mLs (12 Units total) into the skin daily. Patient taking differently: Inject 14 Units into the skin every morning.  11/19/16  Yes Sinda Du, MD  levothyroxine (SYNTHROID, LEVOTHROID) 50 MCG tablet Take 50 mcg by mouth daily before breakfast.    Yes [provider]  magnesium oxide (MAG-OX) 400 MG tablet Take 400 mg by mouth daily.    Yes [provider]  Multiple Vitamin (MULTIVITAMIN WITH MINERALS) TABS  tablet Take 1 tablet by mouth daily.    Yes [provider]  traZODone (DESYREL) 50 MG tablet Take 50 mg by mouth at bedtime.   Yes [provider]  Wheat Dextrin (BENEFIBER PO) Take 10 mLs by mouth daily.    Yes [provider]    Family History Family History  Problem Relation Age of Onset  . Family history unknown: Yes    Social History Social History  Substance Use Topics  . Smoking status: Unknown If Ever Smoked  . Smokeless tobacco: Never Used  . Alcohol use No     Allergies   Cymbalta [duloxetine hcl]; Effexor [venlafaxine]; Morphine and related; and  Tape   Review of Systems Review of Systems  Constitutional: Negative for fever.  HENT: Negative for congestion.   Eyes: Negative for redness.  Respiratory: Negative for shortness of breath.   Cardiovascular: Negative for chest pain.  Gastrointestinal: Negative for abdominal pain, nausea and vomiting.  Genitourinary: Negative for dysuria.  Musculoskeletal: Negative for myalgias.  Skin: Positive for rash.  Neurological: Negative for headaches.  Psychiatric/Behavioral: Negative for confusion.     Physical Exam Updated Vital Signs BP (!) 143/43   Pulse 60   Temp 99.1 F (37.3 C) (Rectal)   Resp 20   Ht 1.6 m (5\' 3" )   Wt 68 kg (150 lb)   SpO2 96%   BMI 26.57 kg/m   Physical Exam  Constitutional: She appears well-developed and well-nourished. No distress.  HENT:  Head: Normocephalic and atraumatic.  Mouth/Throat: Oropharynx is clear and moist.  Eyes: Pupils are equal, round, and reactive to light. EOM are normal.  Neck: Normal range of motion. Neck supple.  Cardiovascular: Normal rate, regular rhythm and normal heart sounds.   Pulmonary/Chest: Effort normal and breath sounds normal.  Abdominal: Soft. Bowel sounds are normal. There is no tenderness.  Musculoskeletal: Normal range of motion.  Neurological: She is alert. No cranial nerve deficit or sensory deficit. She exhibits normal muscle tone. Coordination normal.  Skin: Skin is warm. Rash noted.  Nursing note and vitals reviewed.    ED Treatments / Results  Labs (all labs ordered are listed, but only abnormal results are displayed) Labs Reviewed  CBG MONITORING, ED - Abnormal; Notable for the following:       Result Value   Glucose-Capillary 353 (*)    All other components within normal limits  CBG MONITORING, ED - Abnormal; Notable for the following:    Glucose-Capillary 280 (*)    All other components within normal limits    EKG  EKG Interpretation None       Radiology No results  found.  Procedures Procedures (including critical care time)  Medications Ordered in ED Medications  insulin aspart (novoLOG) injection 2 Units (not administered)     Initial Impression / Assessment and Plan / ED Course  I have reviewed the triage vital signs and the nursing notes.  Pertinent labs & imaging results that were available during my care of the patient were reviewed by me and considered in my medical decision making (see chart for details).    Patient with a yeast like infection in her skin folds. Being treated with antifungal cream. Patient normally receives long-acting insulin in the morning. Been treated with sliding scale throughout the day. Blood sugars here were initially in the 300 range. Recheck right prior to giving her insulin was the 200 range. Based on her sliding scale she receives 2 units Regular Insulin subcutaneous. This was  provided. Patient nontoxic no acute distress. No other specific complaints. Nanine Means should have her Regular Insulin available by morning. She is stable for discharge back to the facility.  Final Clinical Impressions(s) / ED Diagnoses   Final diagnoses:  Hyperglycemia    New Prescriptions New Prescriptions   No medications on file     Fredia Sorrow, MD 12/03/16 2359

## 2016-12-03 NOTE — Discharge Instructions (Signed)
Patient blood sugar came down naturally to the low 200s. Based on her sliding scale. Patient is to receive 2 units of regular insulin. This was provided subcutaneously. Patient stable for discharge back to nursing facility.

## 2016-12-03 NOTE — ED Triage Notes (Signed)
Pt resident of [facility] and reports was just admitted there today and they did not have her insulin yet.  Reports she ate supper and cbg was 361.  Pt says she took her insulin this morning but it had to come from the pharmacy to [facility] this evening.

## 2016-12-03 NOTE — ED Notes (Signed)
Pt has redness noted under breasts, in groin, and perineal area.  Also abrasion to top of r foot.

## 2016-12-07 DIAGNOSIS — E1165 Type 2 diabetes mellitus with hyperglycemia: Secondary | ICD-10-CM | POA: Diagnosis not present

## 2016-12-07 DIAGNOSIS — M1990 Unspecified osteoarthritis, unspecified site: Secondary | ICD-10-CM | POA: Diagnosis not present

## 2016-12-07 DIAGNOSIS — R1310 Dysphagia, unspecified: Secondary | ICD-10-CM | POA: Diagnosis not present

## 2016-12-07 DIAGNOSIS — I2510 Atherosclerotic heart disease of native coronary artery without angina pectoris: Secondary | ICD-10-CM | POA: Diagnosis not present

## 2016-12-07 DIAGNOSIS — Z7951 Long term (current) use of inhaled steroids: Secondary | ICD-10-CM | POA: Diagnosis not present

## 2016-12-07 DIAGNOSIS — I10 Essential (primary) hypertension: Secondary | ICD-10-CM | POA: Diagnosis not present

## 2016-12-07 DIAGNOSIS — Z87440 Personal history of urinary (tract) infections: Secondary | ICD-10-CM | POA: Diagnosis not present

## 2016-12-07 DIAGNOSIS — Z7982 Long term (current) use of aspirin: Secondary | ICD-10-CM | POA: Diagnosis not present

## 2016-12-07 DIAGNOSIS — F0390 Unspecified dementia without behavioral disturbance: Secondary | ICD-10-CM | POA: Diagnosis not present

## 2016-12-07 DIAGNOSIS — Z794 Long term (current) use of insulin: Secondary | ICD-10-CM | POA: Diagnosis not present

## 2016-12-07 DIAGNOSIS — E46 Unspecified protein-calorie malnutrition: Secondary | ICD-10-CM | POA: Diagnosis not present

## 2016-12-07 DIAGNOSIS — I69391 Dysphagia following cerebral infarction: Secondary | ICD-10-CM | POA: Diagnosis not present

## 2016-12-07 DIAGNOSIS — D649 Anemia, unspecified: Secondary | ICD-10-CM | POA: Diagnosis not present

## 2016-12-09 DIAGNOSIS — D649 Anemia, unspecified: Secondary | ICD-10-CM | POA: Diagnosis not present

## 2016-12-09 DIAGNOSIS — E1165 Type 2 diabetes mellitus with hyperglycemia: Secondary | ICD-10-CM | POA: Diagnosis not present

## 2016-12-09 DIAGNOSIS — I10 Essential (primary) hypertension: Secondary | ICD-10-CM | POA: Diagnosis not present

## 2016-12-09 DIAGNOSIS — I69391 Dysphagia following cerebral infarction: Secondary | ICD-10-CM | POA: Diagnosis not present

## 2016-12-09 DIAGNOSIS — I2510 Atherosclerotic heart disease of native coronary artery without angina pectoris: Secondary | ICD-10-CM | POA: Diagnosis not present

## 2016-12-09 DIAGNOSIS — F0390 Unspecified dementia without behavioral disturbance: Secondary | ICD-10-CM | POA: Diagnosis not present

## 2016-12-13 DIAGNOSIS — I69391 Dysphagia following cerebral infarction: Secondary | ICD-10-CM | POA: Diagnosis not present

## 2016-12-13 DIAGNOSIS — I2510 Atherosclerotic heart disease of native coronary artery without angina pectoris: Secondary | ICD-10-CM | POA: Diagnosis not present

## 2016-12-13 DIAGNOSIS — E1165 Type 2 diabetes mellitus with hyperglycemia: Secondary | ICD-10-CM | POA: Diagnosis not present

## 2016-12-13 DIAGNOSIS — D649 Anemia, unspecified: Secondary | ICD-10-CM | POA: Diagnosis not present

## 2016-12-13 DIAGNOSIS — I10 Essential (primary) hypertension: Secondary | ICD-10-CM | POA: Diagnosis not present

## 2016-12-13 DIAGNOSIS — F0390 Unspecified dementia without behavioral disturbance: Secondary | ICD-10-CM | POA: Diagnosis not present

## 2016-12-14 DIAGNOSIS — I69391 Dysphagia following cerebral infarction: Secondary | ICD-10-CM | POA: Diagnosis not present

## 2016-12-14 DIAGNOSIS — F0390 Unspecified dementia without behavioral disturbance: Secondary | ICD-10-CM | POA: Diagnosis not present

## 2016-12-14 DIAGNOSIS — I10 Essential (primary) hypertension: Secondary | ICD-10-CM | POA: Diagnosis not present

## 2016-12-14 DIAGNOSIS — D649 Anemia, unspecified: Secondary | ICD-10-CM | POA: Diagnosis not present

## 2016-12-14 DIAGNOSIS — E1165 Type 2 diabetes mellitus with hyperglycemia: Secondary | ICD-10-CM | POA: Diagnosis not present

## 2016-12-14 DIAGNOSIS — I2510 Atherosclerotic heart disease of native coronary artery without angina pectoris: Secondary | ICD-10-CM | POA: Diagnosis not present

## 2016-12-15 DIAGNOSIS — I69391 Dysphagia following cerebral infarction: Secondary | ICD-10-CM | POA: Diagnosis not present

## 2016-12-15 DIAGNOSIS — I2510 Atherosclerotic heart disease of native coronary artery without angina pectoris: Secondary | ICD-10-CM | POA: Diagnosis not present

## 2016-12-15 DIAGNOSIS — D649 Anemia, unspecified: Secondary | ICD-10-CM | POA: Diagnosis not present

## 2016-12-15 DIAGNOSIS — F0390 Unspecified dementia without behavioral disturbance: Secondary | ICD-10-CM | POA: Diagnosis not present

## 2016-12-15 DIAGNOSIS — I10 Essential (primary) hypertension: Secondary | ICD-10-CM | POA: Diagnosis not present

## 2016-12-15 DIAGNOSIS — E1165 Type 2 diabetes mellitus with hyperglycemia: Secondary | ICD-10-CM | POA: Diagnosis not present

## 2016-12-17 DIAGNOSIS — I69391 Dysphagia following cerebral infarction: Secondary | ICD-10-CM | POA: Diagnosis not present

## 2016-12-17 DIAGNOSIS — E1165 Type 2 diabetes mellitus with hyperglycemia: Secondary | ICD-10-CM | POA: Diagnosis not present

## 2016-12-17 DIAGNOSIS — D649 Anemia, unspecified: Secondary | ICD-10-CM | POA: Diagnosis not present

## 2016-12-17 DIAGNOSIS — I10 Essential (primary) hypertension: Secondary | ICD-10-CM | POA: Diagnosis not present

## 2016-12-17 DIAGNOSIS — F0390 Unspecified dementia without behavioral disturbance: Secondary | ICD-10-CM | POA: Diagnosis not present

## 2016-12-17 DIAGNOSIS — I2510 Atherosclerotic heart disease of native coronary artery without angina pectoris: Secondary | ICD-10-CM | POA: Diagnosis not present

## 2016-12-20 DIAGNOSIS — I69391 Dysphagia following cerebral infarction: Secondary | ICD-10-CM | POA: Diagnosis not present

## 2016-12-20 DIAGNOSIS — D649 Anemia, unspecified: Secondary | ICD-10-CM | POA: Diagnosis not present

## 2016-12-20 DIAGNOSIS — I2510 Atherosclerotic heart disease of native coronary artery without angina pectoris: Secondary | ICD-10-CM | POA: Diagnosis not present

## 2016-12-20 DIAGNOSIS — E1165 Type 2 diabetes mellitus with hyperglycemia: Secondary | ICD-10-CM | POA: Diagnosis not present

## 2016-12-20 DIAGNOSIS — F0390 Unspecified dementia without behavioral disturbance: Secondary | ICD-10-CM | POA: Diagnosis not present

## 2016-12-20 DIAGNOSIS — I10 Essential (primary) hypertension: Secondary | ICD-10-CM | POA: Diagnosis not present

## 2016-12-21 DIAGNOSIS — I2510 Atherosclerotic heart disease of native coronary artery without angina pectoris: Secondary | ICD-10-CM | POA: Diagnosis not present

## 2016-12-21 DIAGNOSIS — I69391 Dysphagia following cerebral infarction: Secondary | ICD-10-CM | POA: Diagnosis not present

## 2016-12-21 DIAGNOSIS — F0390 Unspecified dementia without behavioral disturbance: Secondary | ICD-10-CM | POA: Diagnosis not present

## 2016-12-21 DIAGNOSIS — D649 Anemia, unspecified: Secondary | ICD-10-CM | POA: Diagnosis not present

## 2016-12-21 DIAGNOSIS — I10 Essential (primary) hypertension: Secondary | ICD-10-CM | POA: Diagnosis not present

## 2016-12-21 DIAGNOSIS — E1165 Type 2 diabetes mellitus with hyperglycemia: Secondary | ICD-10-CM | POA: Diagnosis not present

## 2016-12-23 DIAGNOSIS — D649 Anemia, unspecified: Secondary | ICD-10-CM | POA: Diagnosis not present

## 2016-12-23 DIAGNOSIS — F0390 Unspecified dementia without behavioral disturbance: Secondary | ICD-10-CM | POA: Diagnosis not present

## 2016-12-23 DIAGNOSIS — I2510 Atherosclerotic heart disease of native coronary artery without angina pectoris: Secondary | ICD-10-CM | POA: Diagnosis not present

## 2016-12-23 DIAGNOSIS — I10 Essential (primary) hypertension: Secondary | ICD-10-CM | POA: Diagnosis not present

## 2016-12-23 DIAGNOSIS — E1165 Type 2 diabetes mellitus with hyperglycemia: Secondary | ICD-10-CM | POA: Diagnosis not present

## 2016-12-23 DIAGNOSIS — I69391 Dysphagia following cerebral infarction: Secondary | ICD-10-CM | POA: Diagnosis not present

## 2016-12-27 DIAGNOSIS — I2510 Atherosclerotic heart disease of native coronary artery without angina pectoris: Secondary | ICD-10-CM | POA: Diagnosis not present

## 2016-12-27 DIAGNOSIS — F0390 Unspecified dementia without behavioral disturbance: Secondary | ICD-10-CM | POA: Diagnosis not present

## 2016-12-27 DIAGNOSIS — D649 Anemia, unspecified: Secondary | ICD-10-CM | POA: Diagnosis not present

## 2016-12-27 DIAGNOSIS — I69391 Dysphagia following cerebral infarction: Secondary | ICD-10-CM | POA: Diagnosis not present

## 2016-12-27 DIAGNOSIS — I10 Essential (primary) hypertension: Secondary | ICD-10-CM | POA: Diagnosis not present

## 2016-12-27 DIAGNOSIS — E1165 Type 2 diabetes mellitus with hyperglycemia: Secondary | ICD-10-CM | POA: Diagnosis not present

## 2016-12-29 DIAGNOSIS — I2510 Atherosclerotic heart disease of native coronary artery without angina pectoris: Secondary | ICD-10-CM | POA: Diagnosis not present

## 2016-12-29 DIAGNOSIS — D649 Anemia, unspecified: Secondary | ICD-10-CM | POA: Diagnosis not present

## 2016-12-29 DIAGNOSIS — E1165 Type 2 diabetes mellitus with hyperglycemia: Secondary | ICD-10-CM | POA: Diagnosis not present

## 2016-12-29 DIAGNOSIS — I69391 Dysphagia following cerebral infarction: Secondary | ICD-10-CM | POA: Diagnosis not present

## 2016-12-29 DIAGNOSIS — I10 Essential (primary) hypertension: Secondary | ICD-10-CM | POA: Diagnosis not present

## 2016-12-29 DIAGNOSIS — F0390 Unspecified dementia without behavioral disturbance: Secondary | ICD-10-CM | POA: Diagnosis not present

## 2016-12-30 DIAGNOSIS — D649 Anemia, unspecified: Secondary | ICD-10-CM | POA: Diagnosis not present

## 2016-12-30 DIAGNOSIS — I69391 Dysphagia following cerebral infarction: Secondary | ICD-10-CM | POA: Diagnosis not present

## 2016-12-30 DIAGNOSIS — I2510 Atherosclerotic heart disease of native coronary artery without angina pectoris: Secondary | ICD-10-CM | POA: Diagnosis not present

## 2016-12-30 DIAGNOSIS — E1165 Type 2 diabetes mellitus with hyperglycemia: Secondary | ICD-10-CM | POA: Diagnosis not present

## 2016-12-30 DIAGNOSIS — I10 Essential (primary) hypertension: Secondary | ICD-10-CM | POA: Diagnosis not present

## 2016-12-30 DIAGNOSIS — F0390 Unspecified dementia without behavioral disturbance: Secondary | ICD-10-CM | POA: Diagnosis not present

## 2017-01-04 DIAGNOSIS — I2510 Atherosclerotic heart disease of native coronary artery without angina pectoris: Secondary | ICD-10-CM | POA: Diagnosis not present

## 2017-01-04 DIAGNOSIS — D649 Anemia, unspecified: Secondary | ICD-10-CM | POA: Diagnosis not present

## 2017-01-04 DIAGNOSIS — F0390 Unspecified dementia without behavioral disturbance: Secondary | ICD-10-CM | POA: Diagnosis not present

## 2017-01-04 DIAGNOSIS — I10 Essential (primary) hypertension: Secondary | ICD-10-CM | POA: Diagnosis not present

## 2017-01-04 DIAGNOSIS — I69391 Dysphagia following cerebral infarction: Secondary | ICD-10-CM | POA: Diagnosis not present

## 2017-01-04 DIAGNOSIS — E1165 Type 2 diabetes mellitus with hyperglycemia: Secondary | ICD-10-CM | POA: Diagnosis not present

## 2017-01-06 DIAGNOSIS — I2510 Atherosclerotic heart disease of native coronary artery without angina pectoris: Secondary | ICD-10-CM | POA: Diagnosis not present

## 2017-01-06 DIAGNOSIS — E119 Type 2 diabetes mellitus without complications: Secondary | ICD-10-CM | POA: Diagnosis not present

## 2017-01-06 DIAGNOSIS — D649 Anemia, unspecified: Secondary | ICD-10-CM | POA: Diagnosis not present

## 2017-01-06 DIAGNOSIS — F0390 Unspecified dementia without behavioral disturbance: Secondary | ICD-10-CM | POA: Diagnosis not present

## 2017-01-06 DIAGNOSIS — N390 Urinary tract infection, site not specified: Secondary | ICD-10-CM | POA: Diagnosis not present

## 2017-01-06 DIAGNOSIS — E039 Hypothyroidism, unspecified: Secondary | ICD-10-CM | POA: Diagnosis not present

## 2017-01-06 DIAGNOSIS — E1165 Type 2 diabetes mellitus with hyperglycemia: Secondary | ICD-10-CM | POA: Diagnosis not present

## 2017-01-06 DIAGNOSIS — I10 Essential (primary) hypertension: Secondary | ICD-10-CM | POA: Diagnosis not present

## 2017-01-06 DIAGNOSIS — I69391 Dysphagia following cerebral infarction: Secondary | ICD-10-CM | POA: Diagnosis not present

## 2017-01-10 DIAGNOSIS — F0390 Unspecified dementia without behavioral disturbance: Secondary | ICD-10-CM | POA: Diagnosis not present

## 2017-01-10 DIAGNOSIS — I2510 Atherosclerotic heart disease of native coronary artery without angina pectoris: Secondary | ICD-10-CM | POA: Diagnosis not present

## 2017-01-10 DIAGNOSIS — D649 Anemia, unspecified: Secondary | ICD-10-CM | POA: Diagnosis not present

## 2017-01-10 DIAGNOSIS — I10 Essential (primary) hypertension: Secondary | ICD-10-CM | POA: Diagnosis not present

## 2017-01-10 DIAGNOSIS — E1165 Type 2 diabetes mellitus with hyperglycemia: Secondary | ICD-10-CM | POA: Diagnosis not present

## 2017-01-10 DIAGNOSIS — I69391 Dysphagia following cerebral infarction: Secondary | ICD-10-CM | POA: Diagnosis not present

## 2017-01-12 DIAGNOSIS — D649 Anemia, unspecified: Secondary | ICD-10-CM | POA: Diagnosis not present

## 2017-01-12 DIAGNOSIS — I2510 Atherosclerotic heart disease of native coronary artery without angina pectoris: Secondary | ICD-10-CM | POA: Diagnosis not present

## 2017-01-12 DIAGNOSIS — I69391 Dysphagia following cerebral infarction: Secondary | ICD-10-CM | POA: Diagnosis not present

## 2017-01-12 DIAGNOSIS — I10 Essential (primary) hypertension: Secondary | ICD-10-CM | POA: Diagnosis not present

## 2017-01-12 DIAGNOSIS — F0390 Unspecified dementia without behavioral disturbance: Secondary | ICD-10-CM | POA: Diagnosis not present

## 2017-01-12 DIAGNOSIS — E1165 Type 2 diabetes mellitus with hyperglycemia: Secondary | ICD-10-CM | POA: Diagnosis not present

## 2017-03-25 DIAGNOSIS — R350 Frequency of micturition: Secondary | ICD-10-CM | POA: Diagnosis not present

## 2017-03-25 DIAGNOSIS — N390 Urinary tract infection, site not specified: Secondary | ICD-10-CM | POA: Diagnosis not present

## 2017-04-14 DIAGNOSIS — E119 Type 2 diabetes mellitus without complications: Secondary | ICD-10-CM | POA: Diagnosis not present

## 2017-04-14 DIAGNOSIS — I10 Essential (primary) hypertension: Secondary | ICD-10-CM | POA: Diagnosis not present

## 2017-04-14 DIAGNOSIS — E039 Hypothyroidism, unspecified: Secondary | ICD-10-CM | POA: Diagnosis not present

## 2017-04-14 DIAGNOSIS — E785 Hyperlipidemia, unspecified: Secondary | ICD-10-CM | POA: Diagnosis not present

## 2017-04-21 DIAGNOSIS — E785 Hyperlipidemia, unspecified: Secondary | ICD-10-CM | POA: Diagnosis not present

## 2017-04-21 DIAGNOSIS — I10 Essential (primary) hypertension: Secondary | ICD-10-CM | POA: Diagnosis not present

## 2017-04-21 DIAGNOSIS — E039 Hypothyroidism, unspecified: Secondary | ICD-10-CM | POA: Diagnosis not present

## 2017-04-21 DIAGNOSIS — E119 Type 2 diabetes mellitus without complications: Secondary | ICD-10-CM | POA: Diagnosis not present

## 2017-05-03 DIAGNOSIS — N390 Urinary tract infection, site not specified: Secondary | ICD-10-CM | POA: Diagnosis not present

## 2017-06-28 DIAGNOSIS — F0390 Unspecified dementia without behavioral disturbance: Secondary | ICD-10-CM | POA: Diagnosis not present

## 2017-06-28 DIAGNOSIS — E1165 Type 2 diabetes mellitus with hyperglycemia: Secondary | ICD-10-CM | POA: Diagnosis not present

## 2017-06-28 DIAGNOSIS — K210 Gastro-esophageal reflux disease with esophagitis: Secondary | ICD-10-CM | POA: Diagnosis not present

## 2017-06-28 DIAGNOSIS — I10 Essential (primary) hypertension: Secondary | ICD-10-CM | POA: Diagnosis not present

## 2017-09-14 DIAGNOSIS — N390 Urinary tract infection, site not specified: Secondary | ICD-10-CM | POA: Diagnosis not present

## 2017-11-15 DIAGNOSIS — N390 Urinary tract infection, site not specified: Secondary | ICD-10-CM | POA: Diagnosis not present

## 2017-11-29 DIAGNOSIS — N390 Urinary tract infection, site not specified: Secondary | ICD-10-CM | POA: Diagnosis not present

## 2017-12-22 ENCOUNTER — Other Ambulatory Visit (HOSPITAL_COMMUNITY)
Admission: AD | Admit: 2017-12-22 | Discharge: 2017-12-22 | Disposition: A | Discharge status: Home / Self Care | Payer: Medicare Other | Source: Skilled Nursing Facility | Attending: Internal Medicine | Admitting: Internal Medicine

## 2017-12-22 DIAGNOSIS — N390 Urinary tract infection, site not specified: Secondary | ICD-10-CM | POA: Diagnosis not present

## 2017-12-22 LAB — URINALYSIS, ROUTINE W REFLEX MICROSCOPIC
BILIRUBIN URINE: NEGATIVE
Glucose, UA: >=500 mg/dL — AB
HGB URINE DIPSTICK: NEGATIVE
Ketones, ur: NEGATIVE mg/dL
LEUKOCYTES UA: NEGATIVE
Nitrite: NEGATIVE
PH: 5 (ref 5.0–8.0)
Protein, ur: >=300 mg/dL — AB
SPECIFIC GRAVITY, URINE: 1.018 (ref 1.005–1.030)

## 2017-12-24 LAB — URINE CULTURE

## 2017-12-27 DIAGNOSIS — J301 Allergic rhinitis due to pollen: Secondary | ICD-10-CM | POA: Diagnosis not present

## 2017-12-27 DIAGNOSIS — F0390 Unspecified dementia without behavioral disturbance: Secondary | ICD-10-CM | POA: Diagnosis not present

## 2017-12-27 DIAGNOSIS — E1165 Type 2 diabetes mellitus with hyperglycemia: Secondary | ICD-10-CM | POA: Diagnosis not present

## 2017-12-27 DIAGNOSIS — K210 Gastro-esophageal reflux disease with esophagitis: Secondary | ICD-10-CM | POA: Diagnosis not present

## 2018-01-25 DIAGNOSIS — N390 Urinary tract infection, site not specified: Secondary | ICD-10-CM | POA: Diagnosis not present

## 2018-02-03 ENCOUNTER — Encounter (HOSPITAL_COMMUNITY): Payer: Self-pay | Admitting: *Deleted

## 2018-02-03 ENCOUNTER — Emergency Department (HOSPITAL_COMMUNITY): Payer: Medicare Other

## 2018-02-03 ENCOUNTER — Other Ambulatory Visit: Payer: Self-pay

## 2018-02-03 ENCOUNTER — Inpatient Hospital Stay (HOSPITAL_COMMUNITY)
Admission: EM | Admit: 2018-02-03 | Discharge: 2018-02-16 | DRG: 194 | Disposition: A | Discharge status: Skilled Nursing Facility | Payer: Medicare Other | Source: Skilled Nursing Facility | Attending: Pulmonary Disease | Admitting: Pulmonary Disease

## 2018-02-03 DIAGNOSIS — R0602 Shortness of breath: Secondary | ICD-10-CM

## 2018-02-03 DIAGNOSIS — Z8679 Personal history of other diseases of the circulatory system: Secondary | ICD-10-CM

## 2018-02-03 DIAGNOSIS — Z79899 Other long term (current) drug therapy: Secondary | ICD-10-CM

## 2018-02-03 DIAGNOSIS — I509 Heart failure, unspecified: Secondary | ICD-10-CM | POA: Diagnosis present

## 2018-02-03 DIAGNOSIS — Z7982 Long term (current) use of aspirin: Secondary | ICD-10-CM

## 2018-02-03 DIAGNOSIS — Z91048 Other nonmedicinal substance allergy status: Secondary | ICD-10-CM

## 2018-02-03 DIAGNOSIS — N179 Acute kidney failure, unspecified: Secondary | ICD-10-CM | POA: Diagnosis not present

## 2018-02-03 DIAGNOSIS — R0600 Dyspnea, unspecified: Secondary | ICD-10-CM | POA: Diagnosis not present

## 2018-02-03 DIAGNOSIS — F0390 Unspecified dementia without behavioral disturbance: Secondary | ICD-10-CM | POA: Diagnosis present

## 2018-02-03 DIAGNOSIS — Z8673 Personal history of transient ischemic attack (TIA), and cerebral infarction without residual deficits: Secondary | ICD-10-CM

## 2018-02-03 DIAGNOSIS — N1330 Unspecified hydronephrosis: Secondary | ICD-10-CM | POA: Diagnosis not present

## 2018-02-03 DIAGNOSIS — R338 Other retention of urine: Secondary | ICD-10-CM | POA: Diagnosis not present

## 2018-02-03 DIAGNOSIS — Z87440 Personal history of urinary (tract) infections: Secondary | ICD-10-CM

## 2018-02-03 DIAGNOSIS — J189 Pneumonia, unspecified organism: Secondary | ICD-10-CM | POA: Diagnosis not present

## 2018-02-03 DIAGNOSIS — R0902 Hypoxemia: Secondary | ICD-10-CM

## 2018-02-03 DIAGNOSIS — Z66 Do not resuscitate: Secondary | ICD-10-CM | POA: Diagnosis not present

## 2018-02-03 DIAGNOSIS — I130 Hypertensive heart and chronic kidney disease with heart failure and stage 1 through stage 4 chronic kidney disease, or unspecified chronic kidney disease: Secondary | ICD-10-CM | POA: Diagnosis present

## 2018-02-03 DIAGNOSIS — N184 Chronic kidney disease, stage 4 (severe): Secondary | ICD-10-CM | POA: Diagnosis present

## 2018-02-03 DIAGNOSIS — R339 Retention of urine, unspecified: Secondary | ICD-10-CM

## 2018-02-03 DIAGNOSIS — R109 Unspecified abdominal pain: Secondary | ICD-10-CM

## 2018-02-03 DIAGNOSIS — I10 Essential (primary) hypertension: Secondary | ICD-10-CM | POA: Diagnosis not present

## 2018-02-03 DIAGNOSIS — I2510 Atherosclerotic heart disease of native coronary artery without angina pectoris: Secondary | ICD-10-CM | POA: Diagnosis present

## 2018-02-03 DIAGNOSIS — Z79890 Hormone replacement therapy (postmenopausal): Secondary | ICD-10-CM

## 2018-02-03 DIAGNOSIS — Z885 Allergy status to narcotic agent status: Secondary | ICD-10-CM

## 2018-02-03 DIAGNOSIS — I452 Bifascicular block: Secondary | ICD-10-CM | POA: Diagnosis present

## 2018-02-03 DIAGNOSIS — R1084 Generalized abdominal pain: Secondary | ICD-10-CM

## 2018-02-03 DIAGNOSIS — J80 Acute respiratory distress syndrome: Secondary | ICD-10-CM | POA: Diagnosis not present

## 2018-02-03 DIAGNOSIS — Y95 Nosocomial condition: Secondary | ICD-10-CM | POA: Diagnosis present

## 2018-02-03 DIAGNOSIS — R509 Fever, unspecified: Secondary | ICD-10-CM | POA: Diagnosis not present

## 2018-02-03 DIAGNOSIS — I471 Supraventricular tachycardia: Secondary | ICD-10-CM | POA: Diagnosis not present

## 2018-02-03 DIAGNOSIS — Z888 Allergy status to other drugs, medicaments and biological substances status: Secondary | ICD-10-CM

## 2018-02-03 DIAGNOSIS — E1122 Type 2 diabetes mellitus with diabetic chronic kidney disease: Secondary | ICD-10-CM | POA: Diagnosis present

## 2018-02-03 DIAGNOSIS — Z7951 Long term (current) use of inhaled steroids: Secondary | ICD-10-CM

## 2018-02-03 DIAGNOSIS — E039 Hypothyroidism, unspecified: Secondary | ICD-10-CM | POA: Diagnosis present

## 2018-02-03 DIAGNOSIS — Z794 Long term (current) use of insulin: Secondary | ICD-10-CM

## 2018-02-03 DIAGNOSIS — I69391 Dysphagia following cerebral infarction: Secondary | ICD-10-CM

## 2018-02-03 LAB — COMPREHENSIVE METABOLIC PANEL
ALBUMIN: 3.8 g/dL (ref 3.5–5.0)
ALT: 15 U/L (ref 0–44)
AST: 20 U/L (ref 15–41)
Alkaline Phosphatase: 67 U/L (ref 38–126)
Anion gap: 7 (ref 5–15)
BILIRUBIN TOTAL: 0.7 mg/dL (ref 0.3–1.2)
BUN: 30 mg/dL — AB (ref 8–23)
CO2: 20 mmol/L — ABNORMAL LOW (ref 22–32)
CREATININE: 2.01 mg/dL — AB (ref 0.44–1.00)
Calcium: 8.4 mg/dL — ABNORMAL LOW (ref 8.9–10.3)
Chloride: 108 mmol/L (ref 98–111)
GFR calc Af Amer: 24 mL/min — ABNORMAL LOW (ref >60)
GFR calc non Af Amer: 21 mL/min — ABNORMAL LOW (ref >60)
GLUCOSE: 191 mg/dL — AB (ref 70–99)
Potassium: 5.3 mmol/L — ABNORMAL HIGH (ref 3.5–5.1)
Sodium: 135 mmol/L (ref 135–145)
TOTAL PROTEIN: 6.8 g/dL (ref 6.5–8.1)

## 2018-02-03 LAB — CBC WITH DIFFERENTIAL/PLATELET
ABS IMMATURE GRANULOCYTES: 0.09 10*3/uL — AB (ref 0.00–0.07)
BASOS PCT: 1 %
Basophils Absolute: 0.1 10*3/uL (ref 0.0–0.1)
EOS ABS: 0.2 10*3/uL (ref 0.0–0.5)
EOS PCT: 2 %
HCT: 39.3 % (ref 36.0–46.0)
Hemoglobin: 12 g/dL (ref 12.0–15.0)
Immature Granulocytes: 1 %
Lymphocytes Relative: 15 %
Lymphs Abs: 1.8 10*3/uL (ref 0.7–4.0)
MCH: 30.2 pg (ref 26.0–34.0)
MCHC: 30.5 g/dL (ref 30.0–36.0)
MCV: 98.7 fL (ref 80.0–100.0)
MONOS PCT: 9 %
Monocytes Absolute: 1.1 10*3/uL — ABNORMAL HIGH (ref 0.1–1.0)
Neutro Abs: 8.9 10*3/uL — ABNORMAL HIGH (ref 1.7–7.7)
Neutrophils Relative %: 72 %
PLATELETS: 213 10*3/uL (ref 150–400)
RBC: 3.98 MIL/uL (ref 3.87–5.11)
RDW: 13.1 % (ref 11.5–15.5)
WBC: 12.2 10*3/uL — ABNORMAL HIGH (ref 4.0–10.5)
nRBC: 0 % (ref 0.0–0.2)

## 2018-02-03 LAB — LACTIC ACID, PLASMA
Lactic Acid, Venous: 1.2 mmol/L (ref 0.5–1.9)
Lactic Acid, Venous: 0.8 mmol/L (ref 0.5–1.9)

## 2018-02-03 LAB — URINALYSIS, ROUTINE W REFLEX MICROSCOPIC
BILIRUBIN URINE: NEGATIVE
Glucose, UA: 50 mg/dL — AB
Hgb urine dipstick: NEGATIVE
Ketones, ur: NEGATIVE mg/dL
LEUKOCYTES UA: NEGATIVE
Nitrite: NEGATIVE
Protein, ur: >=300 mg/dL — AB
SPECIFIC GRAVITY, URINE: 1.016 (ref 1.005–1.030)
pH: 5 (ref 5.0–8.0)

## 2018-02-03 LAB — TROPONIN I: Troponin I: <0.03 ng/mL (ref <0.03)

## 2018-02-03 LAB — BRAIN NATRIURETIC PEPTIDE: B Natriuretic Peptide: 104 pg/mL — ABNORMAL HIGH (ref 0.0–100.0)

## 2018-02-03 MED ORDER — ACETAMINOPHEN 325 MG PO TABS
650.0000 mg | ORAL_TABLET | Freq: Four times a day (QID) | ORAL | Status: DC | PRN
  Administered 2018-02-04 – 2018-02-07 (×4): 650 mg via ORAL
  Filled 2018-02-03 (×4): qty 2

## 2018-02-03 MED ORDER — IPRATROPIUM-ALBUTEROL 0.5-2.5 (3) MG/3ML IN SOLN
3.0000 mL | Freq: Once | RESPIRATORY_TRACT | Status: AC
  Administered 2018-02-03: 3 mL via RESPIRATORY_TRACT
  Filled 2018-02-03: qty 3

## 2018-02-03 MED ORDER — MAGNESIUM OXIDE 400 (241.3 MG) MG PO TABS
400.0000 mg | ORAL_TABLET | Freq: Every day | ORAL | Status: DC
  Administered 2018-02-04 – 2018-02-16 (×13): 400 mg via ORAL
  Filled 2018-02-03 (×13): qty 1

## 2018-02-03 MED ORDER — FUROSEMIDE 10 MG/ML IJ SOLN: 20.0000 mg | Freq: Once | INTRAMUSCULAR | Status: DC

## 2018-02-03 MED ORDER — ATORVASTATIN CALCIUM 10 MG PO TABS
20.0000 mg | ORAL_TABLET | Freq: Every day | ORAL | Status: DC
  Administered 2018-02-04 – 2018-02-16 (×13): 20 mg via ORAL
  Filled 2018-02-03 (×13): qty 2

## 2018-02-03 MED ORDER — SODIUM CHLORIDE 0.9 % IV SOLN
1000.0000 mL | INTRAVENOUS | Status: DC
  Administered 2018-02-03: 1000 mL via INTRAVENOUS

## 2018-02-03 MED ORDER — VITAMIN D 25 MCG (1000 UNIT) PO TABS
1000.0000 [IU] | ORAL_TABLET | Freq: Every day | ORAL | Status: DC
  Administered 2018-02-04 – 2018-02-16 (×13): 1000 [IU] via ORAL
  Filled 2018-02-03 (×17): qty 1

## 2018-02-03 MED ORDER — SODIUM CHLORIDE 0.9 % IV SOLN
1.0000 g | INTRAVENOUS | Status: DC
  Administered 2018-02-04 – 2018-02-08 (×5): 1 g via INTRAVENOUS
  Filled 2018-02-03 (×6): qty 1

## 2018-02-03 MED ORDER — TRAZODONE HCL 50 MG PO TABS
50.0000 mg | ORAL_TABLET | Freq: Every day | ORAL | Status: DC
  Administered 2018-02-04 – 2018-02-15 (×12): 50 mg via ORAL
  Filled 2018-02-03 (×12): qty 1

## 2018-02-03 MED ORDER — SODIUM CHLORIDE 0.9 % IV SOLN
1.0000 g | Freq: Three times a day (TID) | INTRAVENOUS | Status: DC
  Administered 2018-02-03: 1 g via INTRAVENOUS
  Filled 2018-02-03: qty 1

## 2018-02-03 MED ORDER — FAMOTIDINE 20 MG PO TABS
20.0000 mg | ORAL_TABLET | Freq: Every day | ORAL | Status: DC
  Administered 2018-02-04 – 2018-02-16 (×13): 20 mg via ORAL
  Filled 2018-02-03 (×13): qty 1

## 2018-02-03 MED ORDER — VANCOMYCIN HCL 10 G IV SOLR
1500.0000 mg | Freq: Once | INTRAVENOUS | Status: AC
  Administered 2018-02-04: 1500 mg via INTRAVENOUS
  Filled 2018-02-03: qty 1500

## 2018-02-03 MED ORDER — LEVOTHYROXINE SODIUM 50 MCG PO TABS
50.0000 ug | ORAL_TABLET | Freq: Every day | ORAL | Status: DC
  Administered 2018-02-04 – 2018-02-16 (×13): 50 ug via ORAL
  Filled 2018-02-03 (×13): qty 1

## 2018-02-03 MED ORDER — SODIUM CHLORIDE 0.9% FLUSH: 3.0000 mL | INTRAVENOUS | Status: DC | PRN

## 2018-02-03 MED ORDER — AMLODIPINE BESYLATE 5 MG PO TABS
10.0000 mg | ORAL_TABLET | Freq: Every day | ORAL | Status: DC
  Administered 2018-02-04 – 2018-02-16 (×13): 10 mg via ORAL
  Filled 2018-02-03 (×13): qty 2

## 2018-02-03 MED ORDER — ALBUTEROL SULFATE (2.5 MG/3ML) 0.083% IN NEBU: 5.0000 mg | INHALATION_SOLUTION | Freq: Once | RESPIRATORY_TRACT | Status: DC

## 2018-02-03 MED ORDER — VANCOMYCIN HCL IN DEXTROSE 1-5 GM/200ML-% IV SOLN: 1000.0000 mg | INTRAVENOUS | Status: DC

## 2018-02-03 MED ORDER — FLUTICASONE PROPIONATE 50 MCG/ACT NA SUSP
2.0000 | Freq: Every day | NASAL | Status: DC
  Administered 2018-02-04 – 2018-02-16 (×13): 2 via NASAL
  Filled 2018-02-03 (×2): qty 16

## 2018-02-03 MED ORDER — DEXTROMETHORPHAN-GUAIFENESIN 10-100 MG/5ML PO LIQD
5.0000 mL | ORAL | Status: DC | PRN
  Filled 2018-02-03: qty 5

## 2018-02-03 MED ORDER — ASPIRIN EC 81 MG PO TBEC
81.0000 mg | DELAYED_RELEASE_TABLET | Freq: Every day | ORAL | Status: DC
  Administered 2018-02-04 – 2018-02-16 (×13): 81 mg via ORAL
  Filled 2018-02-03 (×13): qty 1

## 2018-02-03 MED ORDER — ADULT MULTIVITAMIN W/MINERALS CH
1.0000 | ORAL_TABLET | Freq: Every day | ORAL | Status: DC
  Administered 2018-02-04 – 2018-02-16 (×13): 1 via ORAL
  Filled 2018-02-03 (×12): qty 1

## 2018-02-03 MED ORDER — INSULIN ASPART 100 UNIT/ML ~~LOC~~ SOLN
2.0000 [IU] | Freq: Three times a day (TID) | SUBCUTANEOUS | Status: DC
  Administered 2018-02-05 – 2018-02-14 (×2): 2 [IU] via SUBCUTANEOUS

## 2018-02-03 MED ORDER — SODIUM CHLORIDE 0.9 % IV SOLN
250.0000 mL | INTRAVENOUS | Status: DC | PRN
  Administered 2018-02-05: 250 mL via INTRAVENOUS

## 2018-02-03 MED ORDER — SODIUM CHLORIDE 0.9% FLUSH
3.0000 mL | Freq: Two times a day (BID) | INTRAVENOUS | Status: DC
  Administered 2018-02-04 – 2018-02-12 (×19): 3 mL via INTRAVENOUS

## 2018-02-03 MED ORDER — INSULIN GLARGINE 100 UNIT/ML ~~LOC~~ SOLN
12.0000 [IU] | Freq: Every day | SUBCUTANEOUS | Status: DC
  Administered 2018-02-04 – 2018-02-16 (×12): 12 [IU] via SUBCUTANEOUS
  Filled 2018-02-03 (×15): qty 0.12

## 2018-02-03 MED ORDER — HYDRALAZINE HCL 25 MG PO TABS
25.0000 mg | ORAL_TABLET | Freq: Three times a day (TID) | ORAL | Status: DC
  Administered 2018-02-04 – 2018-02-16 (×38): 25 mg via ORAL
  Filled 2018-02-03 (×38): qty 1

## 2018-02-03 MED ORDER — FUROSEMIDE 10 MG/ML IJ SOLN
20.0000 mg | Freq: Once | INTRAMUSCULAR | Status: AC
  Administered 2018-02-03: 20 mg via INTRAVENOUS
  Filled 2018-02-03: qty 2

## 2018-02-03 NOTE — ED Provider Notes (Signed)
[facility] EMERGENCY DEPARTMENT Provider Note   CSN: [id number] Arrival date & time: 02/03/18  1847     History   Chief Complaint Chief Complaint  Patient presents with  . Shortness of Breath    HPI Regina Bush is a 84 y.o. female.  The history is provided by the patient, the nursing home, the EMS personnel and a relative. The history is limited by the condition of the patient (Hx dementia).    Pt was seen at 1900. Per EMS, NH report, pt's family and pt: c/o gradual onset and worsening of persistent SOB and cough for the past several days. Pt states she has been "wheezing." Endorses temps to "101.8." Family states pt became more SOB and tachypneic after ambulating to the bathroom at the NH today. EMS applied O2 N/C for low 90's O2 Sats en route. Denies CP/palpitations, no abd pain, no N/V/D, no back pain, no focal motor weakness, no tingling/numbness in extremities.    Past Medical History:  Diagnosis Date  . Anemia   . Arthritis   . Cerebral infarction ([place])   . Coronary artery disease   . Dementia ([place])   . Diabetes mellitus without complication (HCC)    Type 2  . Dysphagia due to recent cerebral infarction   . Failure to thrive (0-17)   . Hypercalcemia   . Hypertension   . Pneumonitis   . Protein calorie malnutrition ([place])   . Subdural hematoma ([place])   . Thyroid disease     Patient Active Problem List   Diagnosis Date Noted  . Hypothyroidism 11/19/2016  . History of CVA in adulthood 11/19/2016  . UTI (urinary tract infection) 11/15/2016  . ARF (acute renal failure) ([place]) 11/15/2016  . Hyponatremia 11/15/2016  . HCAP (healthcare-associated pneumonia) 08/19/2016  . Subdural hematoma ([place])   . Hypertension   . Diabetes mellitus without complication ([place])   . Coronary artery disease     History reviewed. No pertinent surgical history.   OB History   None      Home Medications    Prior to Admission medications   Medication Sig Start Date End Date  Taking? Authorizing Provider  acetaminophen (TYLENOL) 325 MG tablet Take 2 tablets (650 mg total) by mouth every 6 (six) hours as needed for mild pain or moderate pain. 08/25/16  Yes Rosita Fire, MD  amLODipine (NORVASC) 10 MG tablet Take 10 mg by mouth daily.    Yes [provider]  amoxicillin (AMOXIL) 500 MG capsule Take 500 mg by mouth 3 (three) times daily. Give 1 capsule by mouth three times a day for UTI for 10 days.   Yes [provider]  aspirin EC 81 MG tablet Take 81 mg by mouth daily.   Yes [provider]  atorvastatin (LIPITOR) 20 MG tablet Take 20 mg by mouth daily.    Yes [provider]  cetirizine (ZYRTEC) 10 MG tablet Take 10 mg by mouth at bedtime as needed for allergies.   Yes [provider]  cholecalciferol (VITAMIN D) 1000 units tablet Take 1,000 Units by mouth daily.    Yes [provider]  clotrimazole-betamethasone (LOTRISONE) cream Apply 1 application topically 2 (two) times daily.   Yes [provider]  dextromethorphan-guaiFENesin (ROBITUSSIN-DM) 10-100 MG/5ML liquid Take 5 mLs by mouth every 4 (four) hours as needed for cough. Give 5cc by mouth every 4 hours as needed for cough.   Yes [provider]  dextrose (GLUTOSE 15) 40 % GEL Take 1  Tube by mouth. Give 1 application by mouth as needed for blood sugar less than 60 recheck blood sugar in 15 minutes; notify MD if blood sugar doesn't increase.   Yes [provider]  famotidine (PEPCID) 20 MG tablet Take 20 mg by mouth daily.    Yes [provider]  fluticasone (FLONASE) 50 MCG/ACT nasal spray Place 2 sprays into both nostrils daily.   Yes [provider]  hydrALAZINE (APRESOLINE) 25 MG tablet Take 25 mg by mouth 3 (three) times daily. Give 1 tablet by mouth three times a day for HTN hold for systolic BP <967   Yes [provider]  insulin glargine (LANTUS) 100 UNIT/ML injection Inject 0.12 mLs (12 Units total)  into the skin daily. Patient taking differently: Inject 28 Units into the skin every morning.  11/19/16  Yes Sinda Du, MD  levothyroxine (SYNTHROID, LEVOTHROID) 50 MCG tablet Take 50 mcg by mouth daily before breakfast.    Yes [provider]  magnesium oxide (MAG-OX) 400 MG tablet Take 400 mg by mouth daily.    Yes [provider]  Multiple Vitamin (MULTIVITAMIN WITH MINERALS) TABS tablet Take 1 tablet by mouth daily.    Yes [provider]  naproxen sodium (ALEVE) 220 MG tablet Take 220 mg by mouth every 12 (twelve) hours as needed.   Yes [provider]  traZODone (DESYREL) 50 MG tablet Take 50 mg by mouth at bedtime.   Yes [provider]  trimethoprim (TRIMPEX) 100 MG tablet Take 100 mg by mouth daily. Give 1 tablet by mouth one time a day for preventative.   Yes [provider]  Wheat Dextrin (BENEFIBER PO) Take 10 mLs by mouth daily.    Yes [provider]  insulin aspart (NOVOLOG FLEXPEN) 100 UNIT/ML FlexPen Inject 2-10 Units into the skin 3 (three) times daily. Give per sliding scale three times a day:  201-250= 2 UNITS 251-300= 4 UNITS 301-350= 6 UNITS 351-400= 8 UNITS 401-450=10UNITS    [provider]    Family History Family History  Family history unknown: Yes    Social History Social History   Tobacco Use  . Smoking status: Unknown If Ever Smoked  . Smokeless tobacco: Never Used  Substance Use Topics  . Alcohol use: No  . Drug use: No     Allergies   Cymbalta [duloxetine hcl]; Effexor [venlafaxine]; Morphine and related; and Tape   Review of Systems Review of Systems  Unable to perform ROS: Dementia       Physical Exam Updated Vital Signs BP (!) 162/100 (BP Location: Left Arm)   Pulse 78   Temp 100 F (37.8 C) (Rectal)   Resp (!) 22   Ht 5\' 3"  (1.6 m)   Wt 73.5 kg   SpO2 98%   BMI 28.70 kg/m    Patient Vitals for the past 24 hrs:  BP Temp Temp src Pulse Resp SpO2  Height Weight  02/03/18 2145 (!) 150/67 - - 85 (!) 28 97 % - -  02/03/18 2112 (!) 152/52 - - 79 (!) 24 99 % - -  02/03/18 1928 - 100 F (37.8 C) Rectal - - - - -  02/03/18 1927 - - - - - 98 % - -  02/03/18 1901 - - - - - - 5\' 3"  (1.6 m) 73.5 kg  02/03/18 1859 (!) 162/100 100.2 F (37.9 C) Oral 78 (!) 22 95 % - -     Physical Exam 1905: Physical examination:  Nursing notes reviewed; Vital signs and O2 SAT reviewed;  Constitutional: Well developed, Well nourished, In no acute distress; Head:  Normocephalic, atraumatic; Eyes: EOMI, PERRL, No scleral icterus; ENMT: Mouth and pharynx normal, Mucous membranes dry; Neck: Supple, Full range of motion, No lymphadenopathy; Cardiovascular: Regular rate and rhythm, No gallop; Respiratory: Breath sounds coarse & equal bilaterally, insp/exp wheezes bilat with occasional audible wheezing. Mild tachypnea. Speaking full sentences with ease, Normal respiratory effort/excursion; Chest: Nontender, Movement normal; Abdomen: Soft, Nontender, Nondistended, Normal bowel sounds; Genitourinary: No CVA tenderness; Extremities: Peripheral pulses normal, No tenderness, No edema, No calf edema or asymmetry.; Neuro: AA&Ox3, Major CN grossly intact.  Speech clear. No gross focal motor or sensory deficits in extremities.; Skin: Color normal, Warm, Dry.   ED Treatments / Results  Labs (all labs ordered are listed, but only abnormal results are displayed)   EKG EKG Interpretation  Date/Time:  Friday February 03 2018 19:21:09 EST Ventricular Rate:  79 PR Interval:    QRS Duration: 146 QT Interval:  405 QTC Calculation: 465 R Axis:   -88 Text Interpretation:  Sinus rhythm RBBB and LAFB Artifact When compared with ECG of 11/15/2016 No significant change was found Confirmed by Francine Graven [phone] on 02/03/2018 7:32:53 PM   Radiology   Procedures Procedures (including critical care time)  Medications Ordered in ED Medications  0.9 %  sodium chloride infusion  (0 mLs Intravenous Stopped 02/03/18 2046)  ipratropium-albuterol (DUONEB) 0.5-2.5 (3) MG/3ML nebulizer solution 3 mL (3 mLs Nebulization Given 02/03/18 1920)     Initial Impression / Assessment and Plan / ED Course  I have reviewed the triage vital signs and the nursing notes.  Pertinent labs & imaging results that were available during my care of the patient were reviewed by me and considered in my medical decision making (see chart for details).  MDM Reviewed: previous chart, nursing note and vitals Reviewed previous: labs and ECG Interpretation: labs, ECG and x-ray    Results for orders placed or performed during the hospital encounter of 02/03/18  Comprehensive metabolic panel  Result Value Ref Range   Sodium 135 135 - 145 mmol/L   Potassium 5.3 (H) 3.5 - 5.1 mmol/L   Chloride 108 98 - 111 mmol/L   CO2 20 (L) 22 - 32 mmol/L   Glucose, Bld 191 (H) 70 - 99 mg/dL   BUN 30 (H) 8 - 23 mg/dL   Creatinine, Ser 2.01 (H) 0.44 - 1.00 mg/dL   Calcium 8.4 (L) 8.9 - 10.3 mg/dL   Total Protein 6.8 6.5 - 8.1 g/dL   Albumin 3.8 3.5 - 5.0 g/dL   AST 20 15 - 41 U/L   ALT 15 0 - 44 U/L   Alkaline Phosphatase 67 38 - 126 U/L   Total Bilirubin 0.7 0.3 - 1.2 mg/dL   GFR calc non Af Amer 21 (L) >60 mL/min   GFR calc Af Amer 24 (L) >60 mL/min   Anion gap 7 5 - 15  CBC WITH DIFFERENTIAL  Result Value Ref Range   WBC 12.2 (H) 4.0 - 10.5 K/uL   RBC 3.98 3.87 - 5.11 MIL/uL   Hemoglobin 12.0 12.0 - 15.0 g/dL   HCT 39.3 36.0 - 46.0 %   MCV 98.7 80.0 - 100.0 fL   MCH 30.2 26.0 - 34.0 pg   MCHC 30.5 30.0 - 36.0 g/dL   RDW 13.1 11.5 - 15.5 %   Platelets 213 150 - 400 K/uL   nRBC 0.0 0.0 - 0.2 %  Neutrophils Relative % 72 %   Neutro Abs 8.9 (H) 1.7 - 7.7 K/uL   Lymphocytes Relative 15 %   Lymphs Abs 1.8 0.7 - 4.0 K/uL   Monocytes Relative 9 %   Monocytes Absolute 1.1 (H) 0.1 - 1.0 K/uL   Eosinophils Relative 2 %   Eosinophils Absolute 0.2 0.0 - 0.5 K/uL   Basophils Relative 1 %    Basophils Absolute 0.1 0.0 - 0.1 K/uL   Immature Granulocytes 1 %   Abs Immature Granulocytes 0.09 (H) 0.00 - 0.07 K/uL  Urinalysis, Routine w reflex microscopic  Result Value Ref Range   Color, Urine YELLOW YELLOW   APPearance HAZY (A) CLEAR   Specific Gravity, Urine 1.016 1.005 - 1.030   pH 5.0 5.0 - 8.0   Glucose, UA 50 (A) NEGATIVE mg/dL   Hgb urine dipstick NEGATIVE NEGATIVE   Bilirubin Urine NEGATIVE NEGATIVE   Ketones, ur NEGATIVE NEGATIVE mg/dL   Protein, ur >=300 (A) NEGATIVE mg/dL   Nitrite NEGATIVE NEGATIVE   Leukocytes, UA NEGATIVE NEGATIVE   RBC / HPF 0-5 0 - 5 RBC/hpf   WBC, UA 0-5 0 - 5 WBC/hpf   Bacteria, UA RARE (A) NONE SEEN   Squamous Epithelial / LPF 0-5 0 - 5   Amorphous Crystal PRESENT   Troponin I - ONCE - STAT  Result Value Ref Range   Troponin I <0.03 <0.03 ng/mL  Lactic acid, plasma  Result Value Ref Range   Lactic Acid, Venous 1.2 0.5 - 1.9 mmol/L    Dg Chest Portable 1 View Result Date: 02/03/2018 CLINICAL DATA:  Did dyspnea EXAM: PORTABLE CHEST 1 VIEW COMPARISON:  11/15/2016 FINDINGS: Cardiomegaly with aortic atherosclerosis. Diffuse bilateral pulmonary vascular redistribution and engorgement consistent with mild CHF. Definite effusion or pneumothorax. Osteoarthritis of the included shoulders. IMPRESSION: Cardiomegaly with mild CHF. Nonaneurysmal atherosclerotic aorta with uncoiling. Electronically Signed   By: Ashley Royalty M.D.   On: 02/03/2018 19:48    Results for EDELL, MESENBRINK (MRN [id number]) as of 02/03/2018 22:05  Ref. Range 11/15/2016 18:49 11/16/2016 04:37 11/20/2016 10:53 11/29/2016 06:50 02/03/2018 19:15  BUN Latest Ref Range: 8 - 23 mg/dL 44 (H) 35 (H) 29 (H) 37 (H) 30 (H)  Creatinine Latest Ref Range: 0.44 - 1.00 mg/dL 1.74 (H) 1.19 (H) 1.17 (H) 1.17 (H) 2.01 (H)     2210:  Short neb given on arrival with transient improvement in wheezing and RR. Rectal temp 100 while in the ED; [facility] and UC obtained.  O2 N/C removed; Sats dropped to 90% R/A  while sitting on stretcher. Pt unable to walk even with walker (her baseline) d/t generalized weakness; Sats continued low 90's. Pt with intermittent wheezing and CHF on CXR; will dose IV lasix and another short neb. Dx and testing d/w pt and family.  Questions answered.  Verb understanding, agreeable to admit.  T/C returned from [facility]. Shanon Brow, case discussed, including:  HPI, pertinent PM/SHx, VS/PE, dx testing, ED course and treatment:  Agreeable to admit, requests to start IV abx for HCAP and obtain flu swab.      Final Clinical Impressions(s) / ED Diagnoses   Final diagnoses:  None    ED Discharge Orders    None       Francine Graven, DO 02/05/18 2132

## 2018-02-03 NOTE — H&P (Signed)
History and Physical    Regina Bush LYY:[id number] DOB: 04-Oct-1927 DOA: 02/03/2018  PCP: Virgie Dad, MD  Patient coming from: snf  Chief Complaint: sob,fever, cough  HPI: Regina Bush is a 84 y.o. female with medical history significant of cad, dementia, dm, htn sent in from snf for fever over 102 with sob , weakness and cough for at least a day.  Pt denies any n/v/d.  No urinary symptoms.  Pt normally ambulates at her baseline but was weak today and could not do so. Pt referred for admission for likely pna.  vss.  Review of Systems: As per HPI otherwise 10 point review of systems negative.   Past Medical History:  Diagnosis Date  . Anemia   . Arthritis   . Cerebral infarction ([place])   . Coronary artery disease   . Dementia ([place])   . Diabetes mellitus without complication (HCC)    Type 2  . Dysphagia due to recent cerebral infarction   . Failure to thrive (0-17)   . Hypercalcemia   . Hypertension   . Pneumonitis   . Protein calorie malnutrition ([place])   . Subdural hematoma ([place])   . Thyroid disease     History reviewed. No pertinent surgical history.   has an unknown smoking status. She has never used smokeless tobacco. She reports that she does not drink alcohol or use drugs.  Allergies  Allergen Reactions  . Cymbalta [Duloxetine Hcl] Other (See Comments)    unknown  . Effexor [Venlafaxine] Other (See Comments)    unknown  . Morphine And Related Other (See Comments)    Passed out   . Tape     adhesive    Family History  Family history unknown: Yes  no premature CAD  Prior to Admission medications   Medication Sig Start Date End Date Taking? Authorizing Provider  acetaminophen (TYLENOL) 325 MG tablet Take 2 tablets (650 mg total) by mouth every 6 (six) hours as needed for mild pain or moderate pain. 08/25/16  Yes Rosita Fire, MD  amLODipine (NORVASC) 10 MG tablet Take 10 mg by mouth daily.    Yes [provider]  amoxicillin (AMOXIL) 500 MG  capsule Take 500 mg by mouth 3 (three) times daily. Give 1 capsule by mouth three times a day for UTI for 10 days.   Yes [provider]  aspirin EC 81 MG tablet Take 81 mg by mouth daily.   Yes [provider]  atorvastatin (LIPITOR) 20 MG tablet Take 20 mg by mouth daily.    Yes [provider]  cetirizine (ZYRTEC) 10 MG tablet Take 10 mg by mouth at bedtime as needed for allergies.   Yes [provider]  cholecalciferol (VITAMIN D) 1000 units tablet Take 1,000 Units by mouth daily.    Yes [provider]  clotrimazole-betamethasone (LOTRISONE) cream Apply 1 application topically 2 (two) times daily.   Yes [provider]  dextromethorphan-guaiFENesin (ROBITUSSIN-DM) 10-100 MG/5ML liquid Take 5 mLs by mouth every 4 (four) hours as needed for cough. Give 5cc by mouth every 4 hours as needed for cough.   Yes [provider]  dextrose (GLUTOSE 15) 40 % GEL Take 1 Tube by mouth. Give 1 application by mouth as needed for blood sugar less than 60 recheck blood sugar in 15 minutes; notify MD if blood sugar doesn't increase.   Yes [provider]  famotidine (PEPCID) 20 MG tablet Take 20 mg by mouth daily.    Yes [provider]  fluticasone (FLONASE) 50 MCG/ACT nasal spray Place 2 sprays into both nostrils daily.   Yes [provider]  hydrALAZINE (APRESOLINE) 25 MG tablet Take 25 mg by mouth 3 (three) times daily. Give 1 tablet by mouth three times a day for HTN hold for systolic BP <272   Yes [provider]  insulin glargine (LANTUS) 100 UNIT/ML injection Inject 0.12 mLs (12 Units total) into the skin daily. Patient taking differently: Inject 28 Units into the skin every morning.  11/19/16  Yes Sinda Du, MD  levothyroxine (SYNTHROID, LEVOTHROID) 50 MCG tablet Take 50 mcg by mouth daily before breakfast.    Yes [provider]  magnesium oxide (MAG-OX) 400 MG tablet Take 400 mg by mouth daily.     Yes [provider]  Multiple Vitamin (MULTIVITAMIN WITH MINERALS) TABS tablet Take 1 tablet by mouth daily.    Yes [provider]  naproxen sodium (ALEVE) 220 MG tablet Take 220 mg by mouth every 12 (twelve) hours as needed.   Yes [provider]  traZODone (DESYREL) 50 MG tablet Take 50 mg by mouth at bedtime.   Yes [provider]  trimethoprim (TRIMPEX) 100 MG tablet Take 100 mg by mouth daily. Give 1 tablet by mouth one time a day for preventative.   Yes [provider]  Wheat Dextrin (BENEFIBER PO) Take 10 mLs by mouth daily.    Yes [provider]  insulin aspart (NOVOLOG FLEXPEN) 100 UNIT/ML FlexPen Inject 2-10 Units into the skin 3 (three) times daily. Give per sliding scale three times a day:  201-250= 2 UNITS 251-300= 4 UNITS 301-350= 6 UNITS 351-400= 8 UNITS 401-450=10UNITS    [provider]    Physical Exam: Vitals:   02/03/18 2200 02/03/18 2256 02/03/18 2327 02/03/18 2336  BP: (!) 159/74  (!) 148/60   Pulse: 85  92   Resp: (!) 36  20   Temp:   (!) 101.8 F (38.8 C)   TempSrc:   Oral   SpO2: 91% 91% 97%   Weight:    84 kg  Height:    5\' 3"  (1.6 m)      Constitutional: NAD, calm, comfortable Vitals:   02/03/18 2200 02/03/18 2256 02/03/18 2327 02/03/18 2336  BP: (!) 159/74  (!) 148/60   Pulse: 85  92   Resp: (!) 36  20   Temp:   (!) 101.8 F (38.8 C)   TempSrc:   Oral   SpO2: 91% 91% 97%   Weight:    84 kg  Height:    5\' 3"  (1.6 m)   Eyes: PERRL, lids and conjunctivae normal ENMT: Mucous membranes are moist. Posterior pharynx clear of any exudate or lesions.Normal dentition.  Neck: normal, supple, no masses, no thyromegaly Respiratory: clear to auscultation bilaterally, no wheezing, no crackles. Normal respiratory effort. No accessory muscle use.  Cardiovascular: Regular rate and rhythm, no murmurs / rubs / gallops. No extremity edema. 2+ pedal pulses. No carotid bruits.  Abdomen: no  tenderness, no masses palpated. No hepatosplenomegaly. Bowel sounds positive.  Musculoskeletal: no clubbing / cyanosis. No joint deformity upper and lower extremities. Good ROM, no contractures. Normal muscle tone.  Skin: no rashes, lesions, ulcers. No induration Neurologic: CN 2-12 grossly intact. Sensation intact, DTR normal. Strength 5/5 in all 4.  Psychiatric: Normal judgment and insight. Alert and oriented x 3. Normal mood.    Labs on Admission: I have personally reviewed following labs and imaging studies  CBC:  Recent Labs  Lab 02/03/18 1915  WBC 12.2*  NEUTROABS 8.9*  HGB 12.0  HCT 39.3  MCV 98.7  PLT 161   Basic Metabolic Panel: Recent Labs  Lab 02/03/18 1915  NA 135  K 5.3*  CL 108  CO2 20*  GLUCOSE 191*  BUN 30*  CREATININE 2.01*  CALCIUM 8.4*   GFR: Estimated Creatinine Clearance: 19.9 mL/min (A) (by C-G formula based on SCr of 2.01 mg/dL (H)). Liver Function Tests: Recent Labs  Lab 02/03/18 1915  AST 20  ALT 15  ALKPHOS 67  BILITOT 0.7  PROT 6.8  ALBUMIN 3.8   No results for input(s): LIPASE, AMYLASE in the last 168 hours. No results for input(s): AMMONIA in the last 168 hours. Coagulation Profile: No results for input(s): INR, PROTIME in the last 168 hours. Cardiac Enzymes: Recent Labs  Lab 02/03/18 1915  TROPONINI <0.03   BNP (last 3 results) No results for input(s): PROBNP in the last 8760 hours. HbA1C: No results for input(s): HGBA1C in the last 72 hours. CBG: No results for input(s): GLUCAP in the last 168 hours. Lipid Profile: No results for input(s): CHOL, HDL, LDLCALC, TRIG, CHOLHDL, LDLDIRECT in the last 72 hours. Thyroid Function Tests: No results for input(s): TSH, T4TOTAL, FREET4, T3FREE, THYROIDAB in the last 72 hours. Anemia Panel: No results for input(s): VITAMINB12, FOLATE, FERRITIN, TIBC, IRON, RETICCTPCT in the last 72 hours. Urine analysis:    Component Value Date/Time   COLORURINE YELLOW 02/03/2018 1944    APPEARANCEUR HAZY (A) 02/03/2018 1944   LABSPEC 1.016 02/03/2018 1944   PHURINE 5.0 02/03/2018 1944   GLUCOSEU 50 (A) 02/03/2018 1944   HGBUR NEGATIVE 02/03/2018 1944   BILIRUBINUR NEGATIVE 02/03/2018 1944   KETONESUR NEGATIVE 02/03/2018 1944   PROTEINUR >=300 (A) 02/03/2018 1944   NITRITE NEGATIVE 02/03/2018 1944   LEUKOCYTESUR NEGATIVE 02/03/2018 1944   Sepsis Labs: !!!!!!!!!!!!!!!!!!!!!!!!!!!!!!!!!!!!!!!!!!!! @LABRCNTIP (procalcitonin:4,lacticidven:4) ) Recent Results (from the past 240 hour(s))  Blood Culture (routine x 2)     Status: None (Preliminary result)   Collection Time: 02/03/18  7:54 PM  Result Value Ref Range Status   Specimen Description BLOOD IV SITE  Final   Special Requests   Final    BOTTLES DRAWN AEROBIC AND ANAEROBIC Blood Culture adequate volume Performed at [facility], [address], [place], [place] [address]    Culture PENDING  Incomplete   Report Status PENDING  Incomplete  Blood Culture (routine x 2)     Status: None (Preliminary result)   Collection Time: 02/03/18  8:23 PM  Result Value Ref Range Status   Specimen Description BLOOD RIGHT HAND  Final   Special Requests   Final    Blood Culture adequate volume Performed at [facility], [address], [place], [place] [address]    Culture PENDING  Incomplete   Report Status PENDING  Incomplete     Radiological Exams on Admission: Dg Chest Portable 1 View  Result Date: 02/03/2018 CLINICAL DATA:  Did dyspnea EXAM: PORTABLE CHEST 1 VIEW COMPARISON:  11/15/2016 FINDINGS: Cardiomegaly with aortic atherosclerosis. Diffuse bilateral pulmonary vascular redistribution and engorgement consistent with mild CHF. Definite effusion or pneumothorax. Osteoarthritis of the included shoulders. IMPRESSION: Cardiomegaly with mild CHF. Nonaneurysmal atherosclerotic aorta with uncoiling. Electronically Signed   By: Ashley Royalty M.D.   On: 02/03/2018 19:48    Old chart reviewed Case discussed with edp cxr reviewed  mild edema no focal infiltrate  Assessment/Plan 84 yo female with fever, cough, sob c/w hcap  Principal  Problem:   HCAP (healthcare-associated pneumonia)- place on vanc/cefepime.  Clinically has pna.  Blood and sputum cx pending.  Lactic normal.  Ck flu.  Febrile but vitals o/w normal with normal oxygen sats.  obs on medical bed.    Active Problems:   Coronary artery disease- stable   History of CVA in adulthood-stable   pcp is dr Luan Pulling SIL is dr Karie Kirks   DVT prophylaxis: scds Code Status: DNR Family Communication: none Disposition Plan: 1-2 days Consults called: none Admission status: obs   Annaya Bangert A MD [facility]  If 7PM-7AM, please contact night-coverage [email or web address] Password [facility]  02/03/2018, 11:41 PM

## 2018-02-03 NOTE — ED Triage Notes (Signed)
Call from [facility] for shortness of breath and congestion, temp 101.8. CBG 295 . Oxygen 93% room air.

## 2018-02-03 NOTE — ED Notes (Signed)
RT notified for neb tx. 

## 2018-02-03 NOTE — ED Notes (Signed)
Pt states she walks with a walker. Pt was given a walker which she used to help her get off the bed. Pt could not walk. Pt stood in one place for about a minute and had to sit down. Pts O2 sats fluctuated in between 91 and 93.

## 2018-02-03 NOTE — Progress Notes (Signed)
Pharmacy Antibiotic Note  Regina Bush is a 84 y.o. female admitted on 02/03/2018 with pneumonia.  Pharmacy has been consulted for Vancomycin dosing.  Plan: Vancomycin 1500 mg IV x 1 dose. Vancomycin 1000 mg IV every 48 hours.  Goal trough 15-20 mcg/mL.  Cefepime 1000 mg IV every 24 hours. Monitor labs, c/s, and vanco trough as indicated.  Height: 5\' 3"  (160 cm) Weight: 162 lb (73.5 kg) IBW/kg (Calculated) : 52.4  Temp (24hrs), Avg:100.1 F (37.8 C), Min:100 F (37.8 C), Max:100.2 F (37.9 C)  Recent Labs  Lab 02/03/18 1915 02/03/18 2023  WBC 12.2*  --   CREATININE 2.01*  --   LATICACIDVEN  --  1.2    Estimated Creatinine Clearance: 18.6 mL/min (A) (by C-G formula based on SCr of 2.01 mg/dL (H)).    Allergies  Allergen Reactions  . Cymbalta [Duloxetine Hcl] Other (See Comments)    unknown  . Effexor [Venlafaxine] Other (See Comments)    unknown  . Morphine And Related Other (See Comments)    Passed out   . Tape     adhesive    Antimicrobials this admission: Vanco 11/22 >>  Cefepime 11/22 >>   Dose adjustments this admission: Vanco/Cefepime  Microbiology results: 11/22 BCx: pending 11/22 UCx: pending    Thank you for allowing pharmacy to be a part of this patient's care.  Ramond Craver 02/03/2018 10:49 PM

## 2018-02-04 ENCOUNTER — Inpatient Hospital Stay (HOSPITAL_COMMUNITY): Payer: Medicare Other

## 2018-02-04 ENCOUNTER — Other Ambulatory Visit: Payer: Self-pay

## 2018-02-04 ENCOUNTER — Encounter (HOSPITAL_COMMUNITY): Payer: Self-pay

## 2018-02-04 DIAGNOSIS — D649 Anemia, unspecified: Secondary | ICD-10-CM | POA: Diagnosis not present

## 2018-02-04 DIAGNOSIS — E1140 Type 2 diabetes mellitus with diabetic neuropathy, unspecified: Secondary | ICD-10-CM | POA: Diagnosis not present

## 2018-02-04 DIAGNOSIS — E039 Hypothyroidism, unspecified: Secondary | ICD-10-CM | POA: Diagnosis present

## 2018-02-04 DIAGNOSIS — N183 Chronic kidney disease, stage 3 (moderate): Secondary | ICD-10-CM | POA: Diagnosis not present

## 2018-02-04 DIAGNOSIS — Z91048 Other nonmedicinal substance allergy status: Secondary | ICD-10-CM | POA: Diagnosis not present

## 2018-02-04 DIAGNOSIS — E1165 Type 2 diabetes mellitus with hyperglycemia: Secondary | ICD-10-CM | POA: Diagnosis not present

## 2018-02-04 DIAGNOSIS — R109 Unspecified abdominal pain: Secondary | ICD-10-CM | POA: Diagnosis not present

## 2018-02-04 DIAGNOSIS — R0602 Shortness of breath: Secondary | ICD-10-CM | POA: Diagnosis not present

## 2018-02-04 DIAGNOSIS — N179 Acute kidney failure, unspecified: Secondary | ICD-10-CM | POA: Diagnosis not present

## 2018-02-04 DIAGNOSIS — R338 Other retention of urine: Secondary | ICD-10-CM | POA: Diagnosis not present

## 2018-02-04 DIAGNOSIS — Z7951 Long term (current) use of inhaled steroids: Secondary | ICD-10-CM | POA: Diagnosis not present

## 2018-02-04 DIAGNOSIS — Z978 Presence of other specified devices: Secondary | ICD-10-CM | POA: Diagnosis not present

## 2018-02-04 DIAGNOSIS — I69391 Dysphagia following cerebral infarction: Secondary | ICD-10-CM | POA: Diagnosis not present

## 2018-02-04 DIAGNOSIS — Z888 Allergy status to other drugs, medicaments and biological substances status: Secondary | ICD-10-CM | POA: Diagnosis not present

## 2018-02-04 DIAGNOSIS — I6203 Nontraumatic chronic subdural hemorrhage: Secondary | ICD-10-CM | POA: Diagnosis not present

## 2018-02-04 DIAGNOSIS — I471 Supraventricular tachycardia: Secondary | ICD-10-CM | POA: Diagnosis not present

## 2018-02-04 DIAGNOSIS — Z87440 Personal history of urinary (tract) infections: Secondary | ICD-10-CM | POA: Diagnosis not present

## 2018-02-04 DIAGNOSIS — Y95 Nosocomial condition: Secondary | ICD-10-CM | POA: Diagnosis present

## 2018-02-04 DIAGNOSIS — I2510 Atherosclerotic heart disease of native coronary artery without angina pectoris: Secondary | ICD-10-CM | POA: Diagnosis present

## 2018-02-04 DIAGNOSIS — K439 Ventral hernia without obstruction or gangrene: Secondary | ICD-10-CM | POA: Diagnosis not present

## 2018-02-04 DIAGNOSIS — I130 Hypertensive heart and chronic kidney disease with heart failure and stage 1 through stage 4 chronic kidney disease, or unspecified chronic kidney disease: Secondary | ICD-10-CM | POA: Diagnosis present

## 2018-02-04 DIAGNOSIS — N1330 Unspecified hydronephrosis: Secondary | ICD-10-CM | POA: Diagnosis not present

## 2018-02-04 DIAGNOSIS — Z794 Long term (current) use of insulin: Secondary | ICD-10-CM | POA: Diagnosis not present

## 2018-02-04 DIAGNOSIS — F0390 Unspecified dementia without behavioral disturbance: Secondary | ICD-10-CM | POA: Diagnosis not present

## 2018-02-04 DIAGNOSIS — I509 Heart failure, unspecified: Secondary | ICD-10-CM | POA: Diagnosis present

## 2018-02-04 DIAGNOSIS — E46 Unspecified protein-calorie malnutrition: Secondary | ICD-10-CM | POA: Diagnosis not present

## 2018-02-04 DIAGNOSIS — M6281 Muscle weakness (generalized): Secondary | ICD-10-CM | POA: Diagnosis not present

## 2018-02-04 DIAGNOSIS — J189 Pneumonia, unspecified organism: Secondary | ICD-10-CM | POA: Diagnosis present

## 2018-02-04 DIAGNOSIS — Z7982 Long term (current) use of aspirin: Secondary | ICD-10-CM | POA: Diagnosis not present

## 2018-02-04 DIAGNOSIS — R279 Unspecified lack of coordination: Secondary | ICD-10-CM | POA: Diagnosis not present

## 2018-02-04 DIAGNOSIS — E1122 Type 2 diabetes mellitus with diabetic chronic kidney disease: Secondary | ICD-10-CM | POA: Diagnosis present

## 2018-02-04 DIAGNOSIS — Z885 Allergy status to narcotic agent status: Secondary | ICD-10-CM | POA: Diagnosis not present

## 2018-02-04 DIAGNOSIS — M1990 Unspecified osteoarthritis, unspecified site: Secondary | ICD-10-CM | POA: Diagnosis not present

## 2018-02-04 DIAGNOSIS — Z79890 Hormone replacement therapy (postmenopausal): Secondary | ICD-10-CM | POA: Diagnosis not present

## 2018-02-04 DIAGNOSIS — Z66 Do not resuscitate: Secondary | ICD-10-CM | POA: Diagnosis present

## 2018-02-04 DIAGNOSIS — N281 Cyst of kidney, acquired: Secondary | ICD-10-CM | POA: Diagnosis not present

## 2018-02-04 DIAGNOSIS — I452 Bifascicular block: Secondary | ICD-10-CM | POA: Diagnosis present

## 2018-02-04 DIAGNOSIS — Z8679 Personal history of other diseases of the circulatory system: Secondary | ICD-10-CM | POA: Diagnosis not present

## 2018-02-04 DIAGNOSIS — N184 Chronic kidney disease, stage 4 (severe): Secondary | ICD-10-CM | POA: Diagnosis present

## 2018-02-04 LAB — CBC WITH DIFFERENTIAL/PLATELET
ABS IMMATURE GRANULOCYTES: 0.11 10*3/uL — AB (ref 0.00–0.07)
BASOS PCT: 1 %
Basophils Absolute: 0.1 10*3/uL (ref 0.0–0.1)
EOS ABS: 0.2 10*3/uL (ref 0.0–0.5)
Eosinophils Relative: 2 %
HEMATOCRIT: 40.9 % (ref 36.0–46.0)
HEMOGLOBIN: 12.4 g/dL (ref 12.0–15.0)
Immature Granulocytes: 1 %
Lymphocytes Relative: 15 %
Lymphs Abs: 1.5 10*3/uL (ref 0.7–4.0)
MCH: 31.2 pg (ref 26.0–34.0)
MCHC: 30.3 g/dL (ref 30.0–36.0)
MCV: 102.8 fL — AB (ref 80.0–100.0)
Monocytes Absolute: 1.1 10*3/uL — ABNORMAL HIGH (ref 0.1–1.0)
Monocytes Relative: 11 %
NEUTROS ABS: 7.1 10*3/uL (ref 1.7–7.7)
NEUTROS PCT: 70 %
NRBC: 0 % (ref 0.0–0.2)
Platelets: 143 10*3/uL — ABNORMAL LOW (ref 150–400)
RBC: 3.98 MIL/uL (ref 3.87–5.11)
RDW: 13 % (ref 11.5–15.5)
WBC: 10.1 10*3/uL (ref 4.0–10.5)

## 2018-02-04 LAB — GLUCOSE, CAPILLARY
GLUCOSE-CAPILLARY: 194 mg/dL — AB (ref 70–99)
GLUCOSE-CAPILLARY: 94 mg/dL (ref 70–99)
Glucose-Capillary: 122 mg/dL — ABNORMAL HIGH (ref 70–99)
Glucose-Capillary: 148 mg/dL — ABNORMAL HIGH (ref 70–99)

## 2018-02-04 LAB — BASIC METABOLIC PANEL
ANION GAP: 7 (ref 5–15)
BUN: 25 mg/dL — ABNORMAL HIGH (ref 8–23)
CALCIUM: 8.4 mg/dL — AB (ref 8.9–10.3)
CO2: 22 mmol/L (ref 22–32)
Chloride: 111 mmol/L (ref 98–111)
Creatinine, Ser: 1.75 mg/dL — ABNORMAL HIGH (ref 0.44–1.00)
GFR, EST AFRICAN AMERICAN: 29 mL/min — AB (ref >60)
GFR, EST NON AFRICAN AMERICAN: 25 mL/min — AB (ref >60)
GLUCOSE: 110 mg/dL — AB (ref 70–99)
Potassium: 4.5 mmol/L (ref 3.5–5.1)
Sodium: 140 mmol/L (ref 135–145)

## 2018-02-04 LAB — ECHOCARDIOGRAM COMPLETE
HEIGHTINCHES: 63 in
WEIGHTICAEL: 2962.98 [oz_av]

## 2018-02-04 LAB — INFLUENZA PANEL BY PCR (TYPE A & B)
Influenza A By PCR: NEGATIVE
Influenza B By PCR: NEGATIVE

## 2018-02-04 LAB — STREP PNEUMONIAE URINARY ANTIGEN: STREP PNEUMO URINARY ANTIGEN: NEGATIVE

## 2018-02-04 LAB — MRSA PCR SCREENING: MRSA BY PCR: POSITIVE — AB

## 2018-02-04 MED ORDER — PERFLUTREN LIPID MICROSPHERE
1.0000 mL | INTRAVENOUS | Status: AC | PRN
  Administered 2018-02-04: 2 mL via INTRAVENOUS
  Administered 2018-02-04: 1 mL via INTRAVENOUS
  Filled 2018-02-04: qty 10

## 2018-02-04 MED ORDER — GUAIFENESIN-DM 100-10 MG/5ML PO SYRP: 5.0000 mL | ORAL_SOLUTION | ORAL | Status: DC | PRN

## 2018-02-04 MED ORDER — VANCOMYCIN HCL IN DEXTROSE 1-5 GM/200ML-% IV SOLN
1000.0000 mg | INTRAVENOUS | Status: DC
  Administered 2018-02-05 – 2018-02-09 (×5): 1000 mg via INTRAVENOUS
  Filled 2018-02-04 (×5): qty 200

## 2018-02-04 MED ORDER — NYSTATIN 100000 UNIT/GM EX POWD
Freq: Two times a day (BID) | CUTANEOUS | Status: DC
  Administered 2018-02-04 – 2018-02-16 (×25): via TOPICAL
  Filled 2018-02-04 (×2): qty 15

## 2018-02-04 MED ORDER — VANCOMYCIN HCL 10 G IV SOLR
INTRAVENOUS | Status: AC
  Filled 2018-02-04: qty 1500

## 2018-02-04 NOTE — Progress Notes (Signed)
Notified Dr. Luan Pulling of the 14 beat run of SVT, no orders received regarding the episode of SVT, will continue to monitor patient for any further episodes.

## 2018-02-04 NOTE — Progress Notes (Signed)
*  PRELIMINARY RESULTS* Echocardiogram 2D Echocardiogram has been performed with Definity.  Samuel Germany 02/04/2018, 1:09 PM

## 2018-02-04 NOTE — Progress Notes (Signed)
Subjective: She came to the hospital yesterday late in the evening after being found to be short of breath with increased respiratory rate and febrile.  She is been weak.  Cough was reported but her daughter who is at bedside now says that she did not see a cough.  Chest x-ray showed bilateral infiltrates concerning for volume overload but her clinical situation is more consistent with pneumonia  Objective: Vital signs in last 24 hours: Temp:  [99.6 F (37.6 C)-101.8 F (38.8 C)] 99.6 F (37.6 C) (11/23 0545) Pulse Rate:  [71-92] 71 (11/23 0545) Resp:  [20-36] 20 (11/22 2327) BP: (128-162)/(52-100) 128/85 (11/23 0545) SpO2:  [91 %-99 %] 97 % (11/23 0545) FiO2 (%):  [28 %] 28 % (11/22 2344) Weight:  [73.5 kg-84 kg] 84 kg (11/22 2336) Weight change:  Last BM Date: 02/03/18  Intake/Output from previous day: 11/22 0701 - 11/23 0700 In: 1137.9 [P.O.:240; I.V.:200; IV Piggyback:697.9] Out: 900 [Urine:900]  PHYSICAL EXAM General appearance: She is sleepy but arousable and when she arouses is at her normal mental status Resp: rhonchi bilaterally Cardio: regular rate and rhythm, S1, S2 normal, no murmur, click, rub or gallop GI: soft, non-tender; bowel sounds normal; no masses,  no organomegaly Extremities: extremities normal, atraumatic, no cyanosis or edema  Lab Results:  Results for orders placed or performed during the hospital encounter of 02/03/18 (from the past 48 hour(s))  Comprehensive metabolic panel     Status: Abnormal   Collection Time: 02/03/18  7:15 PM  Result Value Ref Range   Sodium 135 135 - 145 mmol/L   Potassium 5.3 (H) 3.5 - 5.1 mmol/L   Chloride 108 98 - 111 mmol/L   CO2 20 (L) 22 - 32 mmol/L   Glucose, Bld 191 (H) 70 - 99 mg/dL   BUN 30 (H) 8 - 23 mg/dL   Creatinine, Ser 2.01 (H) 0.44 - 1.00 mg/dL   Calcium 8.4 (L) 8.9 - 10.3 mg/dL   Total Protein 6.8 6.5 - 8.1 g/dL   Albumin 3.8 3.5 - 5.0 g/dL   AST 20 15 - 41 U/L   ALT 15 0 - 44 U/L   Alkaline  Phosphatase 67 38 - 126 U/L   Total Bilirubin 0.7 0.3 - 1.2 mg/dL   GFR calc non Af Amer 21 (L) >60 mL/min   GFR calc Af Amer 24 (L) >60 mL/min    Comment: (NOTE) The eGFR has been calculated using the CKD EPI equation. This calculation has not been validated in all clinical situations. eGFR's persistently <60 mL/min signify possible Chronic Kidney Disease.    Anion gap 7 5 - 15    Comment: Performed at [facility], [address], [place], [place] [address]  CBC WITH DIFFERENTIAL     Status: Abnormal   Collection Time: 02/03/18  7:15 PM  Result Value Ref Range   WBC 12.2 (H) 4.0 - 10.5 K/uL   RBC 3.98 3.87 - 5.11 MIL/uL   Hemoglobin 12.0 12.0 - 15.0 g/dL   HCT 39.3 36.0 - 46.0 %   MCV 98.7 80.0 - 100.0 fL   MCH 30.2 26.0 - 34.0 pg   MCHC 30.5 30.0 - 36.0 g/dL   RDW 13.1 11.5 - 15.5 %   Platelets 213 150 - 400 K/uL   nRBC 0.0 0.0 - 0.2 %   Neutrophils Relative % 72 %   Neutro Abs 8.9 (H) 1.7 - 7.7 K/uL   Lymphocytes Relative 15 %   Lymphs Abs 1.8 0.7 -  4.0 K/uL   Monocytes Relative 9 %   Monocytes Absolute 1.1 (H) 0.1 - 1.0 K/uL   Eosinophils Relative 2 %   Eosinophils Absolute 0.2 0.0 - 0.5 K/uL   Basophils Relative 1 %   Basophils Absolute 0.1 0.0 - 0.1 K/uL   Immature Granulocytes 1 %   Abs Immature Granulocytes 0.09 (H) 0.00 - 0.07 K/uL    Comment: Performed at [facility], [address], [place], [place] [address]  Troponin I - ONCE - STAT     Status: None   Collection Time: 02/03/18  7:15 PM  Result Value Ref Range   Troponin I <0.03 <0.03 ng/mL    Comment: Performed at [facility], [address], [place], [place] [address]  Brain natriuretic peptide     Status: Abnormal   Collection Time: 02/03/18  7:16 PM  Result Value Ref Range   B Natriuretic Peptide 104.0 (H) 0.0 - 100.0 pg/mL    Comment: Performed at [facility], [address] [place], [place] [address]  Urinalysis, Routine w reflex microscopic     Status: Abnormal   Collection Time: 02/03/18  7:44 PM   Result Value Ref Range   Color, Urine YELLOW YELLOW   APPearance HAZY (A) CLEAR   Specific Gravity, Urine 1.016 1.005 - 1.030   pH 5.0 5.0 - 8.0   Glucose, UA 50 (A) NEGATIVE mg/dL   Hgb urine dipstick NEGATIVE NEGATIVE   Bilirubin Urine NEGATIVE NEGATIVE   Ketones, ur NEGATIVE NEGATIVE mg/dL   Protein, ur >=300 (A) NEGATIVE mg/dL   Nitrite NEGATIVE NEGATIVE   Leukocytes, UA NEGATIVE NEGATIVE   RBC / HPF 0-5 0 - 5 RBC/hpf   WBC, UA 0-5 0 - 5 WBC/hpf   Bacteria, UA RARE (A) NONE SEEN   Squamous Epithelial / LPF 0-5 0 - 5   Amorphous Crystal PRESENT     Comment: Performed at [facility], [address] [place], [place] [address]  Blood Culture (routine x 2)     Status: None (Preliminary result)   Collection Time: 02/03/18  7:54 PM  Result Value Ref Range   Specimen Description BLOOD IV SITE    Special Requests      BOTTLES DRAWN AEROBIC AND ANAEROBIC Blood Culture adequate volume Performed at [facility], [address] [place], [place] [address]    Culture PENDING    Report Status PENDING   Blood Culture (routine x 2)     Status: None (Preliminary result)   Collection Time: 02/03/18  8:23 PM  Result Value Ref Range   Specimen Description BLOOD RIGHT HAND    Special Requests      Blood Culture adequate volume Performed at [facility], [address], [place], [place] [address]    Culture PENDING    Report Status PENDING   Lactic acid, plasma     Status: None   Collection Time: 02/03/18  8:23 PM  Result Value Ref Range   Lactic Acid, Venous 1.2 0.5 - 1.9 mmol/L    Comment: Performed at [facility], [address], [place], [place] [address]  Lactic acid, plasma     Status: None   Collection Time: 02/03/18 10:53 PM  Result Value Ref Range   Lactic Acid, Venous 0.8 0.5 - 1.9 mmol/L    Comment: Performed at [facility], [address] [place], [place] [address]  Glucose, capillary     Status: Abnormal   Collection Time: 02/04/18 12:18 AM  Result Value Ref Range  Glucose-Capillary 148 (H) 70 - 99 mg/dL   Comment 1 Notify RN    Comment 2 Document in Chart   Influenza panel by PCR (type A & B)     Status: None   Collection Time: 02/04/18  2:00 AM  Result Value Ref Range   Influenza A By PCR NEGATIVE NEGATIVE   Influenza B By PCR NEGATIVE NEGATIVE    Comment: (NOTE) The Xpert Xpress Flu assay is intended as an aid in the diagnosis of  influenza and should not be used as a sole basis for treatment.  This  assay is FDA approved for nasopharyngeal swab specimens only. Nasal  washings and aspirates are unacceptable for Xpert Xpress Flu testing. Performed at [facility], [address], [place], [place] [address]   MRSA PCR Screening     Status: Abnormal   Collection Time: 02/04/18  2:00 AM  Result Value Ref Range   MRSA by PCR POSITIVE (A) NEGATIVE    Comment:        The GeneXpert MRSA Assay (FDA approved for NASAL specimens only), is one component of a comprehensive MRSA colonization surveillance program. It is not intended to diagnose MRSA infection nor to guide or monitor treatment for MRSA infections. RESULT CALLED TO, READ BACK BY AND VERIFIED WITH: GADDY,C @ 0514 ON 02/04/18 BY JUW Performed at [facility], [address] [place], [place] [address]   Basic metabolic panel     Status: Abnormal   Collection Time: 02/04/18  7:01 AM  Result Value Ref Range   Sodium 140 135 - 145 mmol/L   Potassium 4.5 3.5 - 5.1 mmol/L   Chloride 111 98 - 111 mmol/L   CO2 22 22 - 32 mmol/L   Glucose, Bld 110 (H) 70 - 99 mg/dL   BUN 25 (H) 8 - 23 mg/dL   Creatinine, Ser 1.75 (H) 0.44 - 1.00 mg/dL   Calcium 8.4 (L) 8.9 - 10.3 mg/dL   GFR calc non Af Amer 25 (L) >60 mL/min   GFR calc Af Amer 29 (L) >60 mL/min    Comment: (NOTE) The eGFR has been calculated using the CKD EPI equation. This calculation has not been validated in all clinical situations. eGFR's persistently <60 mL/min signify possible Chronic Kidney Disease.    Anion gap 7 5 - 15     Comment: Performed at [facility], [address] [place], [place] [address]  CBC WITH DIFFERENTIAL     Status: Abnormal   Collection Time: 02/04/18  7:01 AM  Result Value Ref Range   WBC 10.1 4.0 - 10.5 K/uL   RBC 3.98 3.87 - 5.11 MIL/uL   Hemoglobin 12.4 12.0 - 15.0 g/dL   HCT 40.9 36.0 - 46.0 %   MCV 102.8 (H) 80.0 - 100.0 fL   MCH 31.2 26.0 - 34.0 pg   MCHC 30.3 30.0 - 36.0 g/dL   RDW 13.0 11.5 - 15.5 %   Platelets 143 (L) 150 - 400 K/uL    Comment: PLATELET COUNT CONFIRMED BY SMEAR SPECIMEN CHECKED FOR CLOTS    nRBC 0.0 0.0 - 0.2 %   Neutrophils Relative % 70 %   Neutro Abs 7.1 1.7 - 7.7 K/uL   Lymphocytes Relative 15 %   Lymphs Abs 1.5 0.7 - 4.0 K/uL   Monocytes Relative 11 %   Monocytes Absolute 1.1 (H) 0.1 - 1.0 K/uL   Eosinophils Relative 2 %   Eosinophils Absolute 0.2 0.0 - 0.5 K/uL   Basophils Relative 1 %  Basophils Absolute 0.1 0.0 - 0.1 K/uL   Immature Granulocytes 1 %   Abs Immature Granulocytes 0.11 (H) 0.00 - 0.07 K/uL    Comment: Performed at [facility], [address], [place], [place] [address]  Glucose, capillary     Status: Abnormal   Collection Time: 02/04/18 10:28 AM  Result Value Ref Range   Glucose-Capillary 194 (H) 70 - 99 mg/dL    ABGS No results for input(s): PHART, PO2ART, TCO2, HCO3 in the last 72 hours.  Invalid input(s): PCO2 CULTURES Recent Results (from the past 240 hour(s))  Blood Culture (routine x 2)     Status: None (Preliminary result)   Collection Time: 02/03/18  7:54 PM  Result Value Ref Range Status   Specimen Description BLOOD IV SITE  Final   Special Requests   Final    BOTTLES DRAWN AEROBIC AND ANAEROBIC Blood Culture adequate volume Performed at [facility], [address], [place], [place] [address]    Culture PENDING  Incomplete   Report Status PENDING  Incomplete  Blood Culture (routine x 2)     Status: None (Preliminary result)   Collection Time: 02/03/18  8:23 PM  Result Value Ref Range Status   Specimen  Description BLOOD RIGHT HAND  Final   Special Requests   Final    Blood Culture adequate volume Performed at [facility], [address], [place], [place] [address]    Culture PENDING  Incomplete   Report Status PENDING  Incomplete  MRSA PCR Screening     Status: Abnormal   Collection Time: 02/04/18  2:00 AM  Result Value Ref Range Status   MRSA by PCR POSITIVE (A) NEGATIVE Final    Comment:        The GeneXpert MRSA Assay (FDA approved for NASAL specimens only), is one component of a comprehensive MRSA colonization surveillance program. It is not intended to diagnose MRSA infection nor to guide or monitor treatment for MRSA infections. RESULT CALLED TO, READ BACK BY AND VERIFIED WITH: GADDY,C @ 0514 ON 02/04/18 BY JUW Performed at [facility], [address], [place], [place] [address]    Studies/Results: Dg Chest Portable 1 View  Result Date: 02/03/2018 CLINICAL DATA:  Did dyspnea EXAM: PORTABLE CHEST 1 VIEW COMPARISON:  11/15/2016 FINDINGS: Cardiomegaly with aortic atherosclerosis. Diffuse bilateral pulmonary vascular redistribution and engorgement consistent with mild CHF. Definite effusion or pneumothorax. Osteoarthritis of the included shoulders. IMPRESSION: Cardiomegaly with mild CHF. Nonaneurysmal atherosclerotic aorta with uncoiling. Electronically Signed   By: Ashley Royalty M.D.   On: 02/03/2018 19:48    Medications:  Prior to Admission:  Medications Prior to Admission  Medication Sig Dispense Refill Last Dose  . acetaminophen (TYLENOL) 325 MG tablet Take 2 tablets (650 mg total) by mouth every 6 (six) hours as needed for mild pain or moderate pain. 60 tablet 3 UNKNOWN  . amLODipine (NORVASC) 10 MG tablet Take 10 mg by mouth daily.    02/02/2018 at Unknown time  . amoxicillin (AMOXIL) 500 MG capsule Take 500 mg by mouth 3 (three) times daily. Give 1 capsule by mouth three times a day for UTI for 10 days.   02/02/2018 at Unknown time  . aspirin EC 81 MG tablet Take 81  mg by mouth daily.   02/02/2018 at Unknown time  . atorvastatin (LIPITOR) 20 MG tablet Take 20 mg by mouth daily.    02/02/2018 at Unknown time  . cetirizine (ZYRTEC) 10 MG tablet Take 10 mg by mouth at bedtime  as needed for allergies.     . cholecalciferol (VITAMIN D) 1000 units tablet Take 1,000 Units by mouth daily.    02/02/2018 at Unknown time  . clotrimazole-betamethasone (LOTRISONE) cream Apply 1 application topically 2 (two) times daily.   02/02/2018 at Unknown time  . dextromethorphan-guaiFENesin (ROBITUSSIN-DM) 10-100 MG/5ML liquid Take 5 mLs by mouth every 4 (four) hours as needed for cough. Give 5cc by mouth every 4 hours as needed for cough.   not in past 5 days  . dextrose (GLUTOSE 15) 40 % GEL Take 1 Tube by mouth. Give 1 application by mouth as needed for blood sugar less than 60 recheck blood sugar in 15 minutes; notify MD if blood sugar doesn't increase.   not in past 5 days  . famotidine (PEPCID) 20 MG tablet Take 20 mg by mouth daily.    02/02/2018 at Unknown time  . fluticasone (FLONASE) 50 MCG/ACT nasal spray Place 2 sprays into both nostrils daily.   02/02/2018 at Unknown time  . hydrALAZINE (APRESOLINE) 25 MG tablet Take 25 mg by mouth 3 (three) times daily. Give 1 tablet by mouth three times a day for HTN hold for systolic BP <102   72/53/6644 at Unknown time  . insulin glargine (LANTUS) 100 UNIT/ML injection Inject 0.12 mLs (12 Units total) into the skin daily. (Patient taking differently: Inject 28 Units into the skin every morning. ) 10 mL 11 02/03/2018 at Unknown time  . levothyroxine (SYNTHROID, LEVOTHROID) 50 MCG tablet Take 50 mcg by mouth daily before breakfast.    02/02/2018 at Unknown time  . magnesium oxide (MAG-OX) 400 MG tablet Take 400 mg by mouth daily.    02/02/2018 at Unknown time  . Multiple Vitamin (MULTIVITAMIN WITH MINERALS) TABS tablet Take 1 tablet by mouth daily.    02/02/2018 at Unknown time  . naproxen sodium (ALEVE) 220 MG tablet Take 220 mg by mouth  every 12 (twelve) hours as needed.   not in past 5 days  . traZODone (DESYREL) 50 MG tablet Take 50 mg by mouth at bedtime.   02/02/2018 at Unknown time  . trimethoprim (TRIMPEX) 100 MG tablet Take 100 mg by mouth daily. Give 1 tablet by mouth one time a day for preventative.   02/03/2018 at Unknown time  . Wheat Dextrin (BENEFIBER PO) Take 10 mLs by mouth daily.    02/02/2018 at Unknown time  . insulin aspart (NOVOLOG FLEXPEN) 100 UNIT/ML FlexPen Inject 2-10 Units into the skin 3 (three) times daily. Give per sliding scale three times a day:  201-250= 2 UNITS 251-300= 4 UNITS 301-350= 6 UNITS 351-400= 8 UNITS 401-450=10UNITS   Not Taking at Unknown time   Scheduled: . amLODipine  10 mg Oral Daily  . aspirin EC  81 mg Oral Daily  . atorvastatin  20 mg Oral Daily  . cholecalciferol  1,000 Units Oral Daily  . famotidine  20 mg Oral Daily  . fluticasone  2 spray Each Nare Daily  . hydrALAZINE  25 mg Oral TID  . insulin aspart  2-10 Units Subcutaneous TID  . insulin glargine  12 Units Subcutaneous Daily  . levothyroxine  50 mcg Oral QAC breakfast  . magnesium oxide  400 mg Oral Daily  . multivitamin with minerals  1 tablet Oral Daily  . sodium chloride flush  3 mL Intravenous Q12H  . traZODone  50 mg Oral QHS   Continuous: . sodium chloride    . ceFEPime (MAXIPIME) IV    . [START ON  02/06/2018] vancomycin     OPF:[id number] chloride, acetaminophen, guaiFENesin-dextromethorphan, sodium chloride flush  Assesment: Admitted with healthcare associated pneumonia and she is on appropriate treatment for that.  Chest x-ray not entirely clear so I am going to get an echocardiogram to see what her left ventricular function is.    She has had multiple urinary tract infections but current antibiotics should of course cover that  She has diabetes on insulin doing okay at this point  She has dementia and typically does pretty well but has more problem when she has other medical illness Principal  Problem:   HCAP (healthcare-associated pneumonia) Active Problems:   Coronary artery disease   History of CVA in adulthood   Pneumonia    Plan: Continue treatments.  Full admission.  Check echocardiogram.    LOS: 0 days   Keghan Mcfarren L 02/04/2018, 10:50 AM

## 2018-02-05 LAB — GLUCOSE, CAPILLARY
GLUCOSE-CAPILLARY: 71 mg/dL (ref 70–99)
GLUCOSE-CAPILLARY: 104 mg/dL — AB (ref 70–99)
GLUCOSE-CAPILLARY: 146 mg/dL — AB (ref 70–99)
Glucose-Capillary: 209 mg/dL — ABNORMAL HIGH (ref 70–99)

## 2018-02-05 LAB — URINE CULTURE: Culture: <10000 — AB

## 2018-02-05 NOTE — Progress Notes (Signed)
Subjective: She says she feels about the same.  No new complaints.  She does not complain of shortness of breath now.  Objective: Vital signs in last 24 hours: Temp:  [98.3 F (36.8 C)-98.9 F (37.2 C)] 98.5 F (36.9 C) (11/24 0521) Pulse Rate:  [74-85] 74 (11/24 0521) Resp:  [20] 20 (11/24 0521) BP: (150-178)/(47-80) 150/47 (11/24 0521) SpO2:  [94 %-96 %] 94 % (11/24 0521) Weight change:  Last BM Date: 02/03/18  Intake/Output from previous day: 11/23 0701 - 11/24 0700 In: 819.7 [P.O.:720; I.V.:3; IV Piggyback:96.7] Out: 850 [Urine:850]  PHYSICAL EXAM General appearance: alert, cooperative and no distress Resp: rhonchi bilaterally Cardio: regular rate and rhythm, S1, S2 normal, no murmur, click, rub or gallop GI: soft, non-tender; bowel sounds normal; no masses,  no organomegaly Extremities: extremities normal, atraumatic, no cyanosis or edema  Lab Results:  Results for orders placed or performed during the hospital encounter of 02/03/18 (from the past 48 hour(s))  Comprehensive metabolic panel     Status: Abnormal   Collection Time: 02/03/18  7:15 PM  Result Value Ref Range   Sodium 135 135 - 145 mmol/L   Potassium 5.3 (H) 3.5 - 5.1 mmol/L   Chloride 108 98 - 111 mmol/L   CO2 20 (L) 22 - 32 mmol/L   Glucose, Bld 191 (H) 70 - 99 mg/dL   BUN 30 (H) 8 - 23 mg/dL   Creatinine, Ser 2.01 (H) 0.44 - 1.00 mg/dL   Calcium 8.4 (L) 8.9 - 10.3 mg/dL   Total Protein 6.8 6.5 - 8.1 g/dL   Albumin 3.8 3.5 - 5.0 g/dL   AST 20 15 - 41 U/L   ALT 15 0 - 44 U/L   Alkaline Phosphatase 67 38 - 126 U/L   Total Bilirubin 0.7 0.3 - 1.2 mg/dL   GFR calc non Af Amer 21 (L) >60 mL/min   GFR calc Af Amer 24 (L) >60 mL/min    Comment: (NOTE) The eGFR has been calculated using the CKD EPI equation. This calculation has not been validated in all clinical situations. eGFR's persistently <60 mL/min signify possible Chronic Kidney Disease.    Anion gap 7 5 - 15    Comment: Performed at [facility], [address] [place], [place] [address]  CBC WITH DIFFERENTIAL     Status: Abnormal   Collection Time: 02/03/18  7:15 PM  Result Value Ref Range   WBC 12.2 (H) 4.0 - 10.5 K/uL   RBC 3.98 3.87 - 5.11 MIL/uL   Hemoglobin 12.0 12.0 - 15.0 g/dL   HCT 39.3 36.0 - 46.0 %   MCV 98.7 80.0 - 100.0 fL   MCH 30.2 26.0 - 34.0 pg   MCHC 30.5 30.0 - 36.0 g/dL   RDW 13.1 11.5 - 15.5 %   Platelets 213 150 - 400 K/uL   nRBC 0.0 0.0 - 0.2 %   Neutrophils Relative % 72 %   Neutro Abs 8.9 (H) 1.7 - 7.7 K/uL   Lymphocytes Relative 15 %   Lymphs Abs 1.8 0.7 - 4.0 K/uL   Monocytes Relative 9 %   Monocytes Absolute 1.1 (H) 0.1 - 1.0 K/uL   Eosinophils Relative 2 %   Eosinophils Absolute 0.2 0.0 - 0.5 K/uL   Basophils Relative 1 %   Basophils Absolute 0.1 0.0 - 0.1 K/uL   Immature Granulocytes 1 %   Abs Immature Granulocytes 0.09 (H) 0.00 - 0.07 K/uL    Comment: Performed at [facility], [address] [place], [place] [address]  Troponin I - ONCE - STAT     Status: None   Collection Time: 02/03/18  7:15 PM  Result Value Ref Range   Troponin I <0.03 <0.03 ng/mL    Comment: Performed at [facility], [address], [place], [place] [address]  Brain natriuretic peptide     Status: Abnormal   Collection Time: 02/03/18  7:16 PM  Result Value Ref Range   B Natriuretic Peptide 104.0 (H) 0.0 - 100.0 pg/mL    Comment: Performed at [facility], [address] [place], [place] [address]  Urinalysis, Routine w reflex microscopic     Status: Abnormal   Collection Time: 02/03/18  7:44 PM  Result Value Ref Range   Color, Urine YELLOW YELLOW   APPearance HAZY (A) CLEAR   Specific Gravity, Urine 1.016 1.005 - 1.030   pH 5.0 5.0 - 8.0   Glucose, UA 50 (A) NEGATIVE mg/dL   Hgb urine dipstick NEGATIVE NEGATIVE   Bilirubin Urine NEGATIVE NEGATIVE   Ketones, ur NEGATIVE NEGATIVE mg/dL   Protein, ur >=300 (A) NEGATIVE mg/dL   Nitrite NEGATIVE NEGATIVE   Leukocytes, UA NEGATIVE NEGATIVE   RBC / HPF  0-5 0 - 5 RBC/hpf   WBC, UA 0-5 0 - 5 WBC/hpf   Bacteria, UA RARE (A) NONE SEEN   Squamous Epithelial / LPF 0-5 0 - 5   Amorphous Crystal PRESENT     Comment: Performed at [facility], [address], [place], [place] [address]  Strep pneumoniae urinary antigen     Status: None   Collection Time: 02/03/18  7:44 PM  Result Value Ref Range   Strep Pneumo Urinary Antigen NEGATIVE NEGATIVE    Comment: PERFORMED AT [facility] Performed at [facility], [place] [address] [place], [place] [address]   Blood Culture (routine x 2)     Status: None (Preliminary result)   Collection Time: 02/03/18  7:54 PM  Result Value Ref Range   Specimen Description BLOOD IV SITE    Special Requests      BOTTLES DRAWN AEROBIC AND ANAEROBIC Blood Culture adequate volume   Culture      NO GROWTH 2 DAYS Performed at [facility], [address] [place], [place] [address]    Report Status PENDING   Blood Culture (routine x 2)     Status: None (Preliminary result)   Collection Time: 02/03/18  8:23 PM  Result Value Ref Range   Specimen Description BLOOD RIGHT HAND    Special Requests Blood Culture adequate volume    Culture      NO GROWTH 2 DAYS Performed at [facility], [address], [place], [place] [address]    Report Status PENDING   Lactic acid, plasma     Status: None   Collection Time: 02/03/18  8:23 PM  Result Value Ref Range   Lactic Acid, Venous 1.2 0.5 - 1.9 mmol/L    Comment: Performed at [facility], [address], [place], [place] [address]  Lactic acid, plasma     Status: None   Collection Time: 02/03/18 10:53 PM  Result Value Ref Range   Lactic Acid, Venous 0.8 0.5 - 1.9 mmol/L    Comment: Performed at [facility], [address] [place],  [address]  Glucose, capillary     Status: Abnormal   Collection Time: 02/04/18 12:18 AM  Result Value Ref Range   Glucose-Capillary 148 (H) 70 - 99 mg/dL  Comment 1 Notify RN    Comment 2 Document in Chart   Influenza  panel by PCR (type A & B)     Status: None   Collection Time: 02/04/18  2:00 AM  Result Value Ref Range   Influenza A By PCR NEGATIVE NEGATIVE   Influenza B By PCR NEGATIVE NEGATIVE    Comment: (NOTE) The Xpert Xpress Flu assay is intended as an aid in the diagnosis of  influenza and should not be used as a sole basis for treatment.  This  assay is FDA approved for nasopharyngeal swab specimens only. Nasal  washings and aspirates are unacceptable for Xpert Xpress Flu testing. Performed at [facility], [address] [place], [place] [address]   MRSA PCR Screening     Status: Abnormal   Collection Time: 02/04/18  2:00 AM  Result Value Ref Range   MRSA by PCR POSITIVE (A) NEGATIVE    Comment:        The GeneXpert MRSA Assay (FDA approved for NASAL specimens only), is one component of a comprehensive MRSA colonization surveillance program. It is not intended to diagnose MRSA infection nor to guide or monitor treatment for MRSA infections. RESULT CALLED TO, READ BACK BY AND VERIFIED WITH: GADDY,C @ 0514 ON 02/04/18 BY JUW Performed at [facility], [address], [place],  [address]   Basic metabolic panel     Status: Abnormal   Collection Time: 02/04/18  7:01 AM  Result Value Ref Range   Sodium 140 135 - 145 mmol/L   Potassium 4.5 3.5 - 5.1 mmol/L   Chloride 111 98 - 111 mmol/L   CO2 22 22 - 32 mmol/L   Glucose, Bld 110 (H) 70 - 99 mg/dL   BUN 25 (H) 8 - 23 mg/dL   Creatinine, Ser 1.75 (H) 0.44 - 1.00 mg/dL   Calcium 8.4 (L) 8.9 - 10.3 mg/dL   GFR calc non Af Amer 25 (L) >60 mL/min   GFR calc Af Amer 29 (L) >60 mL/min    Comment: (NOTE) The eGFR has been calculated using the CKD EPI equation. This calculation has not been validated in all clinical situations. eGFR's persistently <60 mL/min signify possible Chronic Kidney Disease.    Anion gap 7 5 - 15    Comment: Performed at [facility], [address] [place],  [address]  CBC WITH DIFFERENTIAL      Status: Abnormal   Collection Time: 02/04/18  7:01 AM  Result Value Ref Range   WBC 10.1 4.0 - 10.5 K/uL   RBC 3.98 3.87 - 5.11 MIL/uL   Hemoglobin 12.4 12.0 - 15.0 g/dL   HCT 40.9 36.0 - 46.0 %   MCV 102.8 (H) 80.0 - 100.0 fL   MCH 31.2 26.0 - 34.0 pg   MCHC 30.3 30.0 - 36.0 g/dL   RDW 13.0 11.5 - 15.5 %   Platelets 143 (L) 150 - 400 K/uL    Comment: PLATELET COUNT CONFIRMED BY SMEAR SPECIMEN CHECKED FOR CLOTS    nRBC 0.0 0.0 - 0.2 %   Neutrophils Relative % 70 %   Neutro Abs 7.1 1.7 - 7.7 K/uL   Lymphocytes Relative 15 %   Lymphs Abs 1.5 0.7 - 4.0 K/uL   Monocytes Relative 11 %   Monocytes Absolute 1.1 (H) 0.1 - 1.0 K/uL   Eosinophils Relative 2 %   Eosinophils Absolute 0.2 0.0 - 0.5 K/uL   Basophils Relative 1 %   Basophils Absolute 0.1 0.0 -  0.1 K/uL   Immature Granulocytes 1 %   Abs Immature Granulocytes 0.11 (H) 0.00 - 0.07 K/uL    Comment: Performed at [facility], [address], [place], [place] [address]  Glucose, capillary     Status: Abnormal   Collection Time: 02/04/18 10:28 AM  Result Value Ref Range   Glucose-Capillary 194 (H) 70 - 99 mg/dL  Glucose, capillary     Status: None   Collection Time: 02/04/18  4:32 PM  Result Value Ref Range   Glucose-Capillary 94 70 - 99 mg/dL  Glucose, capillary     Status: Abnormal   Collection Time: 02/04/18  9:34 PM  Result Value Ref Range   Glucose-Capillary 122 (H) 70 - 99 mg/dL   Comment 1 Notify RN    Comment 2 Document in Chart   Glucose, capillary     Status: None   Collection Time: 02/05/18  7:43 AM  Result Value Ref Range   Glucose-Capillary 71 70 - 99 mg/dL  Glucose, capillary     Status: Abnormal   Collection Time: 02/05/18  9:48 AM  Result Value Ref Range   Glucose-Capillary 209 (H) 70 - 99 mg/dL    ABGS No results for input(s): PHART, PO2ART, TCO2, HCO3 in the last 72 hours.  Invalid input(s): PCO2 CULTURES Recent Results (from the past 240 hour(s))  Blood Culture (routine x 2)     Status: None  (Preliminary result)   Collection Time: 02/03/18  7:54 PM  Result Value Ref Range Status   Specimen Description BLOOD IV SITE  Final   Special Requests   Final    BOTTLES DRAWN AEROBIC AND ANAEROBIC Blood Culture adequate volume   Culture   Final    NO GROWTH 2 DAYS Performed at [facility], [address], [place], [place] [address]    Report Status PENDING  Incomplete  Blood Culture (routine x 2)     Status: None (Preliminary result)   Collection Time: 02/03/18  8:23 PM  Result Value Ref Range Status   Specimen Description BLOOD RIGHT HAND  Final   Special Requests Blood Culture adequate volume  Final   Culture   Final    NO GROWTH 2 DAYS Performed at [facility], [address] [place], [place] [address]    Report Status PENDING  Incomplete  MRSA PCR Screening     Status: Abnormal   Collection Time: 02/04/18  2:00 AM  Result Value Ref Range Status   MRSA by PCR POSITIVE (A) NEGATIVE Final    Comment:        The GeneXpert MRSA Assay (FDA approved for NASAL specimens only), is one component of a comprehensive MRSA colonization surveillance program. It is not intended to diagnose MRSA infection nor to guide or monitor treatment for MRSA infections. RESULT CALLED TO, READ BACK BY AND VERIFIED WITH: GADDY,C @ 0514 ON 02/04/18 BY JUW Performed at [facility], [address], [place], [place] [address]    Studies/Results: Dg Chest Portable 1 View  Result Date: 02/03/2018 CLINICAL DATA:  Did dyspnea EXAM: PORTABLE CHEST 1 VIEW COMPARISON:  11/15/2016 FINDINGS: Cardiomegaly with aortic atherosclerosis. Diffuse bilateral pulmonary vascular redistribution and engorgement consistent with mild CHF. Definite effusion or pneumothorax. Osteoarthritis of the included shoulders. IMPRESSION: Cardiomegaly with mild CHF. Nonaneurysmal atherosclerotic aorta with uncoiling. Electronically Signed   By: Ashley Royalty M.D.   On: 02/03/2018 19:48    Medications:  Prior to Admission:   Medications Prior to Admission  Medication Sig Dispense Refill  Last Dose  . acetaminophen (TYLENOL) 325 MG tablet Take 2 tablets (650 mg total) by mouth every 6 (six) hours as needed for mild pain or moderate pain. 60 tablet 3 UNKNOWN  . amLODipine (NORVASC) 10 MG tablet Take 10 mg by mouth daily.    02/02/2018 at Unknown time  . amoxicillin (AMOXIL) 500 MG capsule Take 500 mg by mouth 3 (three) times daily. Give 1 capsule by mouth three times a day for UTI for 10 days.   02/02/2018 at Unknown time  . aspirin EC 81 MG tablet Take 81 mg by mouth daily.   02/02/2018 at Unknown time  . atorvastatin (LIPITOR) 20 MG tablet Take 20 mg by mouth daily.    02/02/2018 at Unknown time  . cetirizine (ZYRTEC) 10 MG tablet Take 10 mg by mouth at bedtime as needed for allergies.     . cholecalciferol (VITAMIN D) 1000 units tablet Take 1,000 Units by mouth daily.    02/02/2018 at Unknown time  . clotrimazole-betamethasone (LOTRISONE) cream Apply 1 application topically 2 (two) times daily.   02/02/2018 at Unknown time  . dextromethorphan-guaiFENesin (ROBITUSSIN-DM) 10-100 MG/5ML liquid Take 5 mLs by mouth every 4 (four) hours as needed for cough. Give 5cc by mouth every 4 hours as needed for cough.   not in past 5 days  . dextrose (GLUTOSE 15) 40 % GEL Take 1 Tube by mouth. Give 1 application by mouth as needed for blood sugar less than 60 recheck blood sugar in 15 minutes; notify MD if blood sugar doesn't increase.   not in past 5 days  . famotidine (PEPCID) 20 MG tablet Take 20 mg by mouth daily.    02/02/2018 at Unknown time  . fluticasone (FLONASE) 50 MCG/ACT nasal spray Place 2 sprays into both nostrils daily.   02/02/2018 at Unknown time  . hydrALAZINE (APRESOLINE) 25 MG tablet Take 25 mg by mouth 3 (three) times daily. Give 1 tablet by mouth three times a day for HTN hold for systolic BP <673   41/93/7902 at Unknown time  . insulin glargine (LANTUS) 100 UNIT/ML injection Inject 0.12 mLs (12 Units total) into  the skin daily. (Patient taking differently: Inject 28 Units into the skin every morning. ) 10 mL 11 02/03/2018 at Unknown time  . levothyroxine (SYNTHROID, LEVOTHROID) 50 MCG tablet Take 50 mcg by mouth daily before breakfast.    02/02/2018 at Unknown time  . magnesium oxide (MAG-OX) 400 MG tablet Take 400 mg by mouth daily.    02/02/2018 at Unknown time  . Multiple Vitamin (MULTIVITAMIN WITH MINERALS) TABS tablet Take 1 tablet by mouth daily.    02/02/2018 at Unknown time  . naproxen sodium (ALEVE) 220 MG tablet Take 220 mg by mouth every 12 (twelve) hours as needed.   not in past 5 days  . traZODone (DESYREL) 50 MG tablet Take 50 mg by mouth at bedtime.   02/02/2018 at Unknown time  . trimethoprim (TRIMPEX) 100 MG tablet Take 100 mg by mouth daily. Give 1 tablet by mouth one time a day for preventative.   02/03/2018 at Unknown time  . Wheat Dextrin (BENEFIBER PO) Take 10 mLs by mouth daily.    02/02/2018 at Unknown time  . insulin aspart (NOVOLOG FLEXPEN) 100 UNIT/ML FlexPen Inject 2-10 Units into the skin 3 (three) times daily. Give per sliding scale three times a day:  201-250= 2 UNITS 251-300= 4 UNITS 301-350= 6 UNITS 351-400= 8 UNITS 401-450=10UNITS   Not Taking at Unknown time  Scheduled: . amLODipine  10 mg Oral Daily  . aspirin EC  81 mg Oral Daily  . atorvastatin  20 mg Oral Daily  . cholecalciferol  1,000 Units Oral Daily  . famotidine  20 mg Oral Daily  . fluticasone  2 spray Each Nare Daily  . hydrALAZINE  25 mg Oral TID  . insulin aspart  2-10 Units Subcutaneous TID  . insulin glargine  12 Units Subcutaneous Daily  . levothyroxine  50 mcg Oral QAC breakfast  . magnesium oxide  400 mg Oral Daily  . multivitamin with minerals  1 tablet Oral Daily  . nystatin   Topical BID  . sodium chloride flush  3 mL Intravenous Q12H  . traZODone  50 mg Oral QHS   Continuous: . sodium chloride    . ceFEPime (MAXIPIME) IV Stopped (02/04/18 2159)  . vancomycin 1,000 mg (02/05/18 0824)    OTL:XBWIOM chloride, acetaminophen, guaiFENesin-dextromethorphan, sodium chloride flush  Assesment: She was admitted with healthcare associated pneumonia.  She is better.  She is had multiple urinary tract infections and culture is pending  She has dementia and generally does well until she gets acute illness and then she becomes more confused but she seems to be doing okay now  She had 14 beat run of SVT which was asymptomatic and does not require any treatment Principal Problem:   HCAP (healthcare-associated pneumonia) Active Problems:   Coronary artery disease   History of CVA in adulthood   Pneumonia    Plan: Continue current treatments including IV antibiotics    LOS: 1 day   Perrion Diesel L 02/05/2018, 9:59 AM

## 2018-02-05 NOTE — Progress Notes (Signed)
Notified Dr. Luan Pulling of patient's fever of 101.8 oral.  Informed Dr. Luan Pulling that Tylenol 650 mg was given and incentive spirometer was given to patient and instructed on how to use.  Patient able to achieve a volume of 750 about 4 to 5 times. Instructed patient to use every hour while awake.  Will continue to monitor patient and notify Dr. Luan Pulling if condition worsens.

## 2018-02-06 ENCOUNTER — Inpatient Hospital Stay (HOSPITAL_COMMUNITY): Payer: Medicare Other

## 2018-02-06 LAB — GLUCOSE, CAPILLARY
Glucose-Capillary: 120 mg/dL — ABNORMAL HIGH (ref 70–99)
Glucose-Capillary: 177 mg/dL — ABNORMAL HIGH (ref 70–99)
Glucose-Capillary: 103 mg/dL — ABNORMAL HIGH (ref 70–99)
Glucose-Capillary: 148 mg/dL — ABNORMAL HIGH (ref 70–99)

## 2018-02-06 LAB — CBC WITH DIFFERENTIAL/PLATELET
Abs Immature Granulocytes: 0.05 10*3/uL (ref 0.00–0.07)
Basophils Absolute: 0 10*3/uL (ref 0.0–0.1)
Basophils Relative: 1 %
EOS ABS: 0.3 10*3/uL (ref 0.0–0.5)
EOS PCT: 4 %
HCT: 37.6 % (ref 36.0–46.0)
Hemoglobin: 11.4 g/dL — ABNORMAL LOW (ref 12.0–15.0)
Immature Granulocytes: 1 %
LYMPHS ABS: 1.4 10*3/uL (ref 0.7–4.0)
Lymphocytes Relative: 15 %
MCH: 30.5 pg (ref 26.0–34.0)
MCHC: 30.3 g/dL (ref 30.0–36.0)
MCV: 100.5 fL — AB (ref 80.0–100.0)
MONO ABS: 1 10*3/uL (ref 0.1–1.0)
Monocytes Relative: 11 %
NRBC: 0 % (ref 0.0–0.2)
Neutro Abs: 6.1 10*3/uL (ref 1.7–7.7)
Neutrophils Relative %: 68 %
Platelets: 206 10*3/uL (ref 150–400)
RBC: 3.74 MIL/uL — ABNORMAL LOW (ref 3.87–5.11)
RDW: 13.1 % (ref 11.5–15.5)
WBC: 8.8 10*3/uL (ref 4.0–10.5)

## 2018-02-06 LAB — CREATININE, SERUM
CREATININE: 1.5 mg/dL — AB (ref 0.44–1.00)
GFR, EST AFRICAN AMERICAN: 35 mL/min — AB (ref >60)
GFR, EST NON AFRICAN AMERICAN: 30 mL/min — AB (ref >60)

## 2018-02-06 LAB — BASIC METABOLIC PANEL
Anion gap: 5 (ref 5–15)
BUN: 25 mg/dL — AB (ref 8–23)
CALCIUM: 8.3 mg/dL — AB (ref 8.9–10.3)
CO2: 23 mmol/L (ref 22–32)
Chloride: 110 mmol/L (ref 98–111)
Creatinine, Ser: 1.54 mg/dL — ABNORMAL HIGH (ref 0.44–1.00)
GFR calc Af Amer: 34 mL/min — ABNORMAL LOW (ref >60)
GFR calc non Af Amer: 29 mL/min — ABNORMAL LOW (ref >60)
GLUCOSE: 119 mg/dL — AB (ref 70–99)
Potassium: 4.6 mmol/L (ref 3.5–5.1)
Sodium: 138 mmol/L (ref 135–145)

## 2018-02-06 MED ORDER — CHLORHEXIDINE GLUCONATE CLOTH 2 % EX PADS
6.0000 | MEDICATED_PAD | Freq: Every day | CUTANEOUS | Status: AC
  Administered 2018-02-06 – 2018-02-10 (×4): 6 via TOPICAL

## 2018-02-06 MED ORDER — MUPIROCIN 2 % EX OINT
1.0000 "application " | TOPICAL_OINTMENT | Freq: Two times a day (BID) | CUTANEOUS | Status: AC
  Administered 2018-02-06 – 2018-02-10 (×10): 1 via NASAL
  Filled 2018-02-06: qty 22

## 2018-02-06 NOTE — Progress Notes (Addendum)
Pharmacy Antibiotic Note  Regina Bush is a 84 y.o. female admitted on 02/03/2018 with pneumonia.  Pharmacy has been consulted for Vancomycin dosing.  Plan: Continue Vancomycin 1000 mg IV every 24 hours  Continue Cefepime 1000 mg IV every 24 hours. Monitor labs, c/s, and vanco trough 11/26 0730  Height: 5\' 3"  (160 cm) Weight: 185 lb 3 oz (84 kg) IBW/kg (Calculated) : 52.4  Temp (24hrs), Avg:100.1 F (37.8 C), Min:99 F (37.2 C), Max:101.8 F (38.8 C)  Recent Labs  Lab 02/03/18 1915 02/03/18 2023 02/03/18 2253 02/04/18 0701 02/06/18 0554 02/06/18 0830  WBC 12.2*  --   --  10.1  --  8.8  CREATININE 2.01*  --   --  1.75* 1.50* 1.54*  LATICACIDVEN  --  1.2 0.8  --   --   --     Estimated Creatinine Clearance: 25.9 mL/min (A) (by C-G formula based on SCr of 1.54 mg/dL (H)).    Allergies  Allergen Reactions  . Cymbalta [Duloxetine Hcl] Other (See Comments)    unknown  . Effexor [Venlafaxine] Other (See Comments)    unknown  . Morphine And Related Other (See Comments)    Passed out   . Tape     adhesive    Antimicrobials this admission: Vanco 11/22 >>  Cefepime 11/22 >>   Dose adjustments this admission: Vanco/Cefepime  Microbiology results: 11/22 BCx: ngtd 11/22 UCx: insignificant growth 11/22 MRSA PCR: positive   Thank you for allowing pharmacy to be a part of this patient's care.  Ramond Craver 02/06/2018 10:24 AM

## 2018-02-06 NOTE — Progress Notes (Signed)
Subjective: She is more confused this morning.  Really not able to get any significant history from her.  She had more fever yesterday.  Did respond to Tylenol.  Objective: Vital signs in last 24 hours: Temp:  [99 F (37.2 C)-101.8 F (38.8 C)] 99 F (37.2 C) (11/25 0641) Pulse Rate:  [70-83] 72 (11/25 0641) Resp:  [20] 20 (11/25 0641) BP: (146-179)/(65-86) 146/86 (11/25 0641) SpO2:  [92 %-100 %] 95 % (11/25 0834) Weight change:  Last BM Date: 02/05/18  Intake/Output from previous day: 11/24 0701 - 11/25 0700 In: 1525.2 [P.O.:1200; I.V.:25.9; IV Piggyback:299.3] Out: 800 [Urine:800]  PHYSICAL EXAM General appearance: alert, cooperative, mild distress and Confused Resp: rhonchi bilaterally Cardio: regular rate and rhythm, S1, S2 normal, no murmur, click, rub or gallop GI: soft, non-tender; bowel sounds normal; no masses,  no organomegaly Extremities: extremities normal, atraumatic, no cyanosis or edema  Lab Results:  Results for orders placed or performed during the hospital encounter of 02/03/18 (from the past 48 hour(s))  Glucose, capillary     Status: Abnormal   Collection Time: 02/04/18 10:28 AM  Result Value Ref Range   Glucose-Capillary 194 (H) 70 - 99 mg/dL  Glucose, capillary     Status: None   Collection Time: 02/04/18  4:32 PM  Result Value Ref Range   Glucose-Capillary 94 70 - 99 mg/dL  Glucose, capillary     Status: Abnormal   Collection Time: 02/04/18  9:34 PM  Result Value Ref Range   Glucose-Capillary 122 (H) 70 - 99 mg/dL   Comment 1 Notify RN    Comment 2 Document in Chart   Glucose, capillary     Status: None   Collection Time: 02/05/18  7:43 AM  Result Value Ref Range   Glucose-Capillary 71 70 - 99 mg/dL  Glucose, capillary     Status: Abnormal   Collection Time: 02/05/18  9:48 AM  Result Value Ref Range   Glucose-Capillary 209 (H) 70 - 99 mg/dL  Glucose, capillary     Status: Abnormal   Collection Time: 02/05/18  4:49 PM  Result Value Ref  Range   Glucose-Capillary 104 (H) 70 - 99 mg/dL  Glucose, capillary     Status: Abnormal   Collection Time: 02/05/18 10:27 PM  Result Value Ref Range   Glucose-Capillary 146 (H) 70 - 99 mg/dL   Comment 1 Notify RN    Comment 2 Document in Chart   Creatinine, serum     Status: Abnormal   Collection Time: 02/06/18  5:54 AM  Result Value Ref Range   Creatinine, Ser 1.50 (H) 0.44 - 1.00 mg/dL   GFR calc non Af Amer 30 (L) >60 mL/min   GFR calc Af Amer 35 (L) >60 mL/min    Comment: (NOTE) The eGFR has been calculated using the CKD EPI equation. This calculation has not been validated in all clinical situations. eGFR's persistently <60 mL/min signify possible Chronic Kidney Disease. Performed at [facility], [address], [place]  [address]   Glucose, capillary     Status: Abnormal   Collection Time: 02/06/18  7:28 AM  Result Value Ref Range   Glucose-Capillary 120 (H) 70 - 99 mg/dL    ABGS No results for input(s): PHART, PO2ART, TCO2, HCO3 in the last 72 hours.  Invalid input(s): PCO2 CULTURES Recent Results (from the past 240 hour(s))  Urine culture     Status: Abnormal   Collection Time: 02/03/18  7:44 PM  Result Value Ref Range Status  Specimen Description   Final    URINE, CATHETERIZED Performed at [facility], [address] [place], [place] [address]    Special Requests   Final    NONE Performed at [facility], [address], [place], [place] [address]    Culture (A)  Final    <10,000 COLONIES/mL INSIGNIFICANT GROWTH Performed at [facility] [address] [place], [place] [address]    Report Status 02/05/2018 FINAL  Final  Blood Culture (routine x 2)     Status: None (Preliminary result)   Collection Time: 02/03/18  7:54 PM  Result Value Ref Range Status   Specimen Description BLOOD IV SITE  Final   Special Requests   Final    BOTTLES DRAWN AEROBIC AND ANAEROBIC Blood Culture adequate volume   Culture   Final    NO GROWTH 3  DAYS Performed at [facility], [address] [place], [place] [address]    Report Status PENDING  Incomplete  Blood Culture (routine x 2)     Status: None (Preliminary result)   Collection Time: 02/03/18  8:23 PM  Result Value Ref Range Status   Specimen Description BLOOD RIGHT HAND  Final   Special Requests   Final    BOTTLES DRAWN AEROBIC AND ANAEROBIC Blood Culture adequate volume   Culture   Final    NO GROWTH 3 DAYS Performed at [facility], [address], [place], [place] [address]    Report Status PENDING  Incomplete  MRSA PCR Screening     Status: Abnormal   Collection Time: 02/04/18  2:00 AM  Result Value Ref Range Status   MRSA by PCR POSITIVE (A) NEGATIVE Final    Comment:        The GeneXpert MRSA Assay (FDA approved for NASAL specimens only), is one component of a comprehensive MRSA colonization surveillance program. It is not intended to diagnose MRSA infection nor to guide or monitor treatment for MRSA infections. RESULT CALLED TO, READ BACK BY AND VERIFIED WITH: GADDY,C @ 0514 ON 02/04/18 BY JUW Performed at [facility], [address], [place], [place] [address]    Studies/Results: No results found.  Medications:  Prior to Admission:  Medications Prior to Admission  Medication Sig Dispense Refill Last Dose  . acetaminophen (TYLENOL) 325 MG tablet Take 2 tablets (650 mg total) by mouth every 6 (six) hours as needed for mild pain or moderate pain. 60 tablet 3 UNKNOWN  . amLODipine (NORVASC) 10 MG tablet Take 10 mg by mouth daily.    02/02/2018 at Unknown time  . amoxicillin (AMOXIL) 500 MG capsule Take 500 mg by mouth 3 (three) times daily. Give 1 capsule by mouth three times a day for UTI for 10 days.   02/02/2018 at Unknown time  . aspirin EC 81 MG tablet Take 81 mg by mouth daily.   02/02/2018 at Unknown time  . atorvastatin (LIPITOR) 20 MG tablet Take 20 mg by mouth daily.    02/02/2018 at Unknown time  . cetirizine (ZYRTEC) 10 MG tablet Take 10 mg  by mouth at bedtime as needed for allergies.     . cholecalciferol (VITAMIN D) 1000 units tablet Take 1,000 Units by mouth daily.    02/02/2018 at Unknown time  . clotrimazole-betamethasone (LOTRISONE) cream Apply 1 application topically 2 (two) times daily.   02/02/2018 at Unknown time  . dextromethorphan-guaiFENesin (ROBITUSSIN-DM) 10-100 MG/5ML liquid Take 5 mLs by mouth every 4 (four) hours as needed for cough. Give 5cc  by mouth every 4 hours as needed for cough.   not in past 5 days  . dextrose (GLUTOSE 15) 40 % GEL Take 1 Tube by mouth. Give 1 application by mouth as needed for blood sugar less than 60 recheck blood sugar in 15 minutes; notify MD if blood sugar doesn't increase.   not in past 5 days  . famotidine (PEPCID) 20 MG tablet Take 20 mg by mouth daily.    02/02/2018 at Unknown time  . fluticasone (FLONASE) 50 MCG/ACT nasal spray Place 2 sprays into both nostrils daily.   02/02/2018 at Unknown time  . hydrALAZINE (APRESOLINE) 25 MG tablet Take 25 mg by mouth 3 (three) times daily. Give 1 tablet by mouth three times a day for HTN hold for systolic BP <884   16/60/6301 at Unknown time  . insulin glargine (LANTUS) 100 UNIT/ML injection Inject 0.12 mLs (12 Units total) into the skin daily. (Patient taking differently: Inject 28 Units into the skin every morning. ) 10 mL 11 02/03/2018 at Unknown time  . levothyroxine (SYNTHROID, LEVOTHROID) 50 MCG tablet Take 50 mcg by mouth daily before breakfast.    02/02/2018 at Unknown time  . magnesium oxide (MAG-OX) 400 MG tablet Take 400 mg by mouth daily.    02/02/2018 at Unknown time  . Multiple Vitamin (MULTIVITAMIN WITH MINERALS) TABS tablet Take 1 tablet by mouth daily.    02/02/2018 at Unknown time  . naproxen sodium (ALEVE) 220 MG tablet Take 220 mg by mouth every 12 (twelve) hours as needed.   not in past 5 days  . traZODone (DESYREL) 50 MG tablet Take 50 mg by mouth at bedtime.   02/02/2018 at Unknown time  . trimethoprim (TRIMPEX) 100 MG  tablet Take 100 mg by mouth daily. Give 1 tablet by mouth one time a day for preventative.   02/03/2018 at Unknown time  . Wheat Dextrin (BENEFIBER PO) Take 10 mLs by mouth daily.    02/02/2018 at Unknown time  . insulin aspart (NOVOLOG FLEXPEN) 100 UNIT/ML FlexPen Inject 2-10 Units into the skin 3 (three) times daily. Give per sliding scale three times a day:  201-250= 2 UNITS 251-300= 4 UNITS 301-350= 6 UNITS 351-400= 8 UNITS 401-450=10UNITS   Not Taking at Unknown time   Scheduled: . amLODipine  10 mg Oral Daily  . aspirin EC  81 mg Oral Daily  . atorvastatin  20 mg Oral Daily  . cholecalciferol  1,000 Units Oral Daily  . famotidine  20 mg Oral Daily  . fluticasone  2 spray Each Nare Daily  . hydrALAZINE  25 mg Oral TID  . insulin aspart  2-10 Units Subcutaneous TID  . insulin glargine  12 Units Subcutaneous Daily  . levothyroxine  50 mcg Oral QAC breakfast  . magnesium oxide  400 mg Oral Daily  . multivitamin with minerals  1 tablet Oral Daily  . nystatin   Topical BID  . sodium chloride flush  3 mL Intravenous Q12H  . traZODone  50 mg Oral QHS   Continuous: . sodium chloride 250 mL (02/05/18 2200)  . ceFEPime (MAXIPIME) IV 1 g (02/05/18 2203)  . vancomycin Stopped (02/05/18 0934)   SWF:UXNATF chloride, acetaminophen, guaiFENesin-dextromethorphan, sodium chloride flush  Assesment: She was admitted with healthcare associated pneumonia.  She is had more fever.  She is on broad-spectrum antibiotics which are continuing.  She has had multiple UTIs that will need to be checked.  She has previous stroke which is stable  She has  dementia likely from her stroke and that is giving her more trouble now she is much more confused Principal Problem:   HCAP (healthcare-associated pneumonia) Active Problems:   Coronary artery disease   History of CVA in adulthood   Pneumonia    Plan: Check blood today.  Check chest x-ray today.  Continue other treatments    LOS: 2 days    Christen Bedoya L 02/06/2018, 8:39 AM

## 2018-02-07 LAB — GLUCOSE, CAPILLARY
GLUCOSE-CAPILLARY: 106 mg/dL — AB (ref 70–99)
GLUCOSE-CAPILLARY: 169 mg/dL — AB (ref 70–99)
GLUCOSE-CAPILLARY: 177 mg/dL — AB (ref 70–99)
Glucose-Capillary: 153 mg/dL — ABNORMAL HIGH (ref 70–99)
Glucose-Capillary: 164 mg/dL — ABNORMAL HIGH (ref 70–99)

## 2018-02-07 LAB — VANCOMYCIN, TROUGH: VANCOMYCIN TR: 16 ug/mL (ref 15–20)

## 2018-02-07 MED ORDER — FUROSEMIDE 10 MG/ML IJ SOLN
40.0000 mg | Freq: Once | INTRAMUSCULAR | Status: AC
  Administered 2018-02-07: 40 mg via INTRAVENOUS
  Filled 2018-02-07: qty 4

## 2018-02-07 NOTE — Progress Notes (Signed)
Pharmacy Antibiotic Note  Regina Bush is a 84 y.o. female admitted on 02/03/2018 with pneumonia.  Pharmacy has been consulted for Vancomycin dosing. VT reported at 23mcg/ml at goal. Tmax 99.3, still confused. Chest xray ordered for today, f/u.  Plan: Continue Vancomycin 1000 mg IV every 24 hours  Continue Cefepime 1000 mg IV every 24 hours. Monitor labs, c/s  Height: 5\' 3"  (160 cm) Weight: 185 lb 3 oz (84 kg) IBW/kg (Calculated) : 52.4  Temp (24hrs), Avg:98.7 F (37.1 C), Min:98.3 F (36.8 C), Max:99.3 F (37.4 C)  Recent Labs  Lab 02/03/18 1915 02/03/18 2023 02/03/18 2253 02/04/18 0701 02/06/18 0554 02/06/18 0830 02/07/18 0720  WBC 12.2*  --   --  10.1  --  8.8  --   CREATININE 2.01*  --   --  1.75* 1.50* 1.54*  --   LATICACIDVEN  --  1.2 0.8  --   --   --   --   VANCOTROUGH  --   --   --   --   --   --  16    Estimated Creatinine Clearance: 25.9 mL/min (A) (by C-G formula based on SCr of 1.54 mg/dL (H)).    Allergies  Allergen Reactions  . Cymbalta [Duloxetine Hcl] Other (See Comments)    unknown  . Effexor [Venlafaxine] Other (See Comments)    unknown  . Morphine And Related Other (See Comments)    Passed out   . Tape     adhesive    Antimicrobials this admission: Vanco 11/22 >>  Cefepime 11/22 >>   Dose adjustments this admission: Vanco/Cefepime  Microbiology results: 11/22 BCx: ngtd 11/22 UCx: insignificant growth 11/22 MRSA PCR: positive   Thank you for allowing pharmacy to be a part of this patient's care.  Isac Sarna, BS Pharm D, [place] Clinical Pharmacist Pager [phone] 02/07/2018 9:12 AM

## 2018-02-07 NOTE — Progress Notes (Signed)
Subjective: She is more alert today.  Not as confused.  No new complaints.  Objective: Vital signs in last 24 hours: Temp:  [98.3 F (36.8 C)-99.3 F (37.4 C)] 98.3 F (36.8 C) (11/26 0516) Pulse Rate:  [61-67] 67 (11/26 0516) Resp:  [19-28] 20 (11/26 0516) BP: (133-139)/(54-65) 139/65 (11/26 0516) SpO2:  [97 %-99 %] 97 % (11/26 0844) Weight change:  Last BM Date: 02/06/18  Intake/Output from previous day: 11/25 0701 - 11/26 0700 In: 1000 [P.O.:1000] Out: 950 [Urine:950]  PHYSICAL EXAM General appearance: alert, cooperative and no distress Resp: rhonchi bilaterally Cardio: regular rate and rhythm, S1, S2 normal, no murmur, click, rub or gallop GI: soft, non-tender; bowel sounds normal; no masses,  no organomegaly Extremities: extremities normal, atraumatic, no cyanosis or edema  Lab Results:  Results for orders placed or performed during the hospital encounter of 02/03/18 (from the past 48 hour(s))  Glucose, capillary     Status: Abnormal   Collection Time: 02/05/18  9:48 AM  Result Value Ref Range   Glucose-Capillary 209 (H) 70 - 99 mg/dL  Glucose, capillary     Status: Abnormal   Collection Time: 02/05/18  4:49 PM  Result Value Ref Range   Glucose-Capillary 104 (H) 70 - 99 mg/dL  Glucose, capillary     Status: Abnormal   Collection Time: 02/05/18 10:27 PM  Result Value Ref Range   Glucose-Capillary 146 (H) 70 - 99 mg/dL   Comment 1 Notify RN    Comment 2 Document in Chart   Creatinine, serum     Status: Abnormal   Collection Time: 02/06/18  5:54 AM  Result Value Ref Range   Creatinine, Ser 1.50 (H) 0.44 - 1.00 mg/dL   GFR calc non Af Amer 30 (L) >60 mL/min   GFR calc Af Amer 35 (L) >60 mL/min    Comment: (NOTE) The eGFR has been calculated using the CKD EPI equation. This calculation has not been validated in all clinical situations. eGFR's persistently <60 mL/min signify possible Chronic Kidney Disease. Performed at [facility], [address]  [place], [place] [address]   Glucose, capillary     Status: Abnormal   Collection Time: 02/06/18  7:28 AM  Result Value Ref Range   Glucose-Capillary 120 (H) 70 - 99 mg/dL  CBC with Differential/Platelet     Status: Abnormal   Collection Time: 02/06/18  8:30 AM  Result Value Ref Range   WBC 8.8 4.0 - 10.5 K/uL   RBC 3.74 (L) 3.87 - 5.11 MIL/uL   Hemoglobin 11.4 (L) 12.0 - 15.0 g/dL   HCT 37.6 36.0 - 46.0 %   MCV 100.5 (H) 80.0 - 100.0 fL   MCH 30.5 26.0 - 34.0 pg   MCHC 30.3 30.0 - 36.0 g/dL   RDW 13.1 11.5 - 15.5 %   Platelets 206 150 - 400 K/uL   nRBC 0.0 0.0 - 0.2 %   Neutrophils Relative % 68 %   Neutro Abs 6.1 1.7 - 7.7 K/uL   Lymphocytes Relative 15 %   Lymphs Abs 1.4 0.7 - 4.0 K/uL   Monocytes Relative 11 %   Monocytes Absolute 1.0 0.1 - 1.0 K/uL   Eosinophils Relative 4 %   Eosinophils Absolute 0.3 0.0 - 0.5 K/uL   Basophils Relative 1 %   Basophils Absolute 0.0 0.0 - 0.1 K/uL   Immature Granulocytes 1 %   Abs Immature Granulocytes 0.05 0.00 - 0.07 K/uL    Comment: Performed at [facility], [address] [place], [place] [address]  Basic metabolic panel     Status: Abnormal   Collection Time: 02/06/18  8:30 AM  Result Value Ref Range   Sodium 138 135 - 145 mmol/L   Potassium 4.6 3.5 - 5.1 mmol/L   Chloride 110 98 - 111 mmol/L   CO2 23 22 - 32 mmol/L   Glucose, Bld 119 (H) 70 - 99 mg/dL   BUN 25 (H) 8 - 23 mg/dL   Creatinine, Ser 1.54 (H) 0.44 - 1.00 mg/dL   Calcium 8.3 (L) 8.9 - 10.3 mg/dL   GFR calc non Af Amer 29 (L) >60 mL/min   GFR calc Af Amer 34 (L) >60 mL/min    Comment: (NOTE) The eGFR has been calculated using the CKD EPI equation. This calculation has not been validated in all clinical situations. eGFR's persistently <60 mL/min signify possible Chronic Kidney Disease.    Anion gap 5 5 - 15    Comment: Performed at [facility], [address], [place], [place] [address]  Glucose, capillary     Status: Abnormal   Collection Time: 02/06/18  9:52 AM   Result Value Ref Range   Glucose-Capillary 177 (H) 70 - 99 mg/dL  Glucose, capillary     Status: Abnormal   Collection Time: 02/06/18  4:06 PM  Result Value Ref Range   Glucose-Capillary 103 (H) 70 - 99 mg/dL  Glucose, capillary     Status: Abnormal   Collection Time: 02/06/18 10:09 PM  Result Value Ref Range   Glucose-Capillary 148 (H) 70 - 99 mg/dL   Comment 1 Notify RN    Comment 2 Document in Chart   Vancomycin, trough     Status: None   Collection Time: 02/07/18  7:20 AM  Result Value Ref Range   Vancomycin Tr 16 15 - 20 ug/mL    Comment: Performed at [facility], [address] [place], [place] [address]  Glucose, capillary     Status: Abnormal   Collection Time: 02/07/18  7:52 AM  Result Value Ref Range   Glucose-Capillary 106 (H) 70 - 99 mg/dL    ABGS No results for input(s): PHART, PO2ART, TCO2, HCO3 in the last 72 hours.  Invalid input(s): PCO2 CULTURES Recent Results (from the past 240 hour(s))  Urine culture     Status: Abnormal   Collection Time: 02/03/18  7:44 PM  Result Value Ref Range Status   Specimen Description   Final    URINE, CATHETERIZED Performed at [facility], [address], [place], [place] [address]    Special Requests   Final    NONE Performed at [facility], [address] [place], [place] [address]    Culture (A)  Final    <10,000 COLONIES/mL INSIGNIFICANT GROWTH Performed at [facility] [address] [place], [place] [address]    Report Status 02/05/2018 FINAL  Final  Blood Culture (routine x 2)     Status: None (Preliminary result)   Collection Time: 02/03/18  7:54 PM  Result Value Ref Range Status   Specimen Description BLOOD IV SITE  Final   Special Requests   Final    BOTTLES DRAWN AEROBIC AND ANAEROBIC Blood Culture adequate volume   Culture   Final    NO GROWTH 4 DAYS Performed at [facility], [address] [place], [place] [address]    Report Status PENDING  Incomplete  Blood Culture (routine x 2)      Status: None (Preliminary result)  Collection Time: 02/03/18  8:23 PM  Result Value Ref Range Status   Specimen Description BLOOD RIGHT HAND  Final   Special Requests   Final    BOTTLES DRAWN AEROBIC AND ANAEROBIC Blood Culture adequate volume   Culture   Final    NO GROWTH 4 DAYS Performed at [facility], [address] [place] [address], [place], [place] [address]    Report Status PENDING  Incomplete  MRSA PCR Screening     Status: Abnormal   Collection Time: 02/04/18  2:00 AM  Result Value Ref Range Status   MRSA by PCR POSITIVE (A) NEGATIVE Final    Comment:        The GeneXpert MRSA Assay (FDA approved for NASAL specimens only), is one component of a comprehensive MRSA colonization surveillance program. It is not intended to diagnose MRSA infection nor to guide or monitor treatment for MRSA infections. RESULT CALLED TO, READ BACK BY AND VERIFIED WITH: GADDY,C @ 0514 ON 02/04/18 BY JUW Performed at [facility] [address], [place], [place] [address]    Studies/Results: Dg Chest Port 1 View  Result Date: 02/06/2018 CLINICAL DATA:  Pneumonia, shortness of breath, fever EXAM: PORTABLE CHEST 1 VIEW COMPARISON:  02/03/2018 FINDINGS: Cardiomegaly with vascular congestion and mild bilateral interstitial and alveolar opacities, similar prior study most compatible with CHF. Possible small effusions. No acute bony abnormality. Low lung volumes. IMPRESSION: Continued mild CHF, unchanged. Question small effusions. Electronically Signed   By: Rolm Baptise M.D.   On: 02/06/2018 10:14    Medications:  Prior to Admission:  Medications Prior to Admission  Medication Sig Dispense Refill Last Dose  . acetaminophen (TYLENOL) 325 MG tablet Take 2 tablets (650 mg total) by mouth every 6 (six) hours as needed for mild pain or moderate pain. 60 tablet 3 UNKNOWN  . amLODipine (NORVASC) 10 MG tablet Take 10 mg by mouth daily.    02/02/2018 at Unknown time  . amoxicillin (AMOXIL) 500 MG capsule Take 500 mg by  mouth 3 (three) times daily. Give 1 capsule by mouth three times a day for UTI for 10 days.   02/02/2018 at Unknown time  . aspirin EC 81 MG tablet Take 81 mg by mouth daily.   02/02/2018 at Unknown time  . atorvastatin (LIPITOR) 20 MG tablet Take 20 mg by mouth daily.    02/02/2018 at Unknown time  . cetirizine (ZYRTEC) 10 MG tablet Take 10 mg by mouth at bedtime as needed for allergies.     . cholecalciferol (VITAMIN D) 1000 units tablet Take 1,000 Units by mouth daily.    02/02/2018 at Unknown time  . clotrimazole-betamethasone (LOTRISONE) cream Apply 1 application topically 2 (two) times daily.   02/02/2018 at Unknown time  . dextromethorphan-guaiFENesin (ROBITUSSIN-DM) 10-100 MG/5ML liquid Take 5 mLs by mouth every 4 (four) hours as needed for cough. Give 5cc by mouth every 4 hours as needed for cough.   not in past 5 days  . dextrose (GLUTOSE 15) 40 % GEL Take 1 Tube by mouth. Give 1 application by mouth as needed for blood sugar less than 60 recheck blood sugar in 15 minutes; notify MD if blood sugar doesn't increase.   not in past 5 days  . famotidine (PEPCID) 20 MG tablet Take 20 mg by mouth daily.    02/02/2018 at Unknown time  . fluticasone (FLONASE) 50 MCG/ACT nasal spray Place 2 sprays into both nostrils daily.   02/02/2018 at Unknown time  . hydrALAZINE (APRESOLINE) 25 MG tablet  Take 25 mg by mouth 3 (three) times daily. Give 1 tablet by mouth three times a day for HTN hold for systolic BP <606   30/16/0109 at Unknown time  . insulin glargine (LANTUS) 100 UNIT/ML injection Inject 0.12 mLs (12 Units total) into the skin daily. (Patient taking differently: Inject 28 Units into the skin every morning. ) 10 mL 11 02/03/2018 at Unknown time  . levothyroxine (SYNTHROID, LEVOTHROID) 50 MCG tablet Take 50 mcg by mouth daily before breakfast.    02/02/2018 at Unknown time  . magnesium oxide (MAG-OX) 400 MG tablet Take 400 mg by mouth daily.    02/02/2018 at Unknown time  . Multiple Vitamin  (MULTIVITAMIN WITH MINERALS) TABS tablet Take 1 tablet by mouth daily.    02/02/2018 at Unknown time  . naproxen sodium (ALEVE) 220 MG tablet Take 220 mg by mouth every 12 (twelve) hours as needed.   not in past 5 days  . traZODone (DESYREL) 50 MG tablet Take 50 mg by mouth at bedtime.   02/02/2018 at Unknown time  . trimethoprim (TRIMPEX) 100 MG tablet Take 100 mg by mouth daily. Give 1 tablet by mouth one time a day for preventative.   02/03/2018 at Unknown time  . Wheat Dextrin (BENEFIBER PO) Take 10 mLs by mouth daily.    02/02/2018 at Unknown time  . insulin aspart (NOVOLOG FLEXPEN) 100 UNIT/ML FlexPen Inject 2-10 Units into the skin 3 (three) times daily. Give per sliding scale three times a day:  201-250= 2 UNITS 251-300= 4 UNITS 301-350= 6 UNITS 351-400= 8 UNITS 401-450=10UNITS   Not Taking at Unknown time   Scheduled: . amLODipine  10 mg Oral Daily  . aspirin EC  81 mg Oral Daily  . atorvastatin  20 mg Oral Daily  . Chlorhexidine Gluconate Cloth  6 each Topical Q0600  . cholecalciferol  1,000 Units Oral Daily  . famotidine  20 mg Oral Daily  . fluticasone  2 spray Each Nare Daily  . hydrALAZINE  25 mg Oral TID  . insulin aspart  2-10 Units Subcutaneous TID  . insulin glargine  12 Units Subcutaneous Daily  . levothyroxine  50 mcg Oral QAC breakfast  . magnesium oxide  400 mg Oral Daily  . multivitamin with minerals  1 tablet Oral Daily  . mupirocin ointment  1 application Nasal BID  . nystatin   Topical BID  . sodium chloride flush  3 mL Intravenous Q12H  . traZODone  50 mg Oral QHS   Continuous: . sodium chloride 250 mL (02/05/18 2200)  . ceFEPime (MAXIPIME) IV 1 g (02/06/18 2215)  . vancomycin 1,000 mg (02/06/18 0854)   NAT:FTDDUK chloride, acetaminophen, guaiFENesin-dextromethorphan, sodium chloride flush  Assesment: She was admitted with pneumonia.  Her chest x-ray yesterday was still read as showing what may be some pulmonary edema song going to give her a dose of  Lasix although clinically this is much more like pneumonia.  She was considerably more confused yesterday and that is better.  She has dementia at baseline and it gets worse when she has increased trouble with infection etc. Principal Problem:   HCAP (healthcare-associated pneumonia) Active Problems:   Coronary artery disease   History of CVA in adulthood   Pneumonia    Plan: Continue treatments.  Potential discharge next 48 hours    LOS: 3 days   , L 02/07/2018, 9:15 AM

## 2018-02-08 LAB — GLUCOSE, CAPILLARY
GLUCOSE-CAPILLARY: 174 mg/dL — AB (ref 70–99)
GLUCOSE-CAPILLARY: 131 mg/dL — AB (ref 70–99)
Glucose-Capillary: 119 mg/dL — ABNORMAL HIGH (ref 70–99)
Glucose-Capillary: 151 mg/dL — ABNORMAL HIGH (ref 70–99)

## 2018-02-08 LAB — CREATININE, SERUM
Creatinine, Ser: 1.43 mg/dL — ABNORMAL HIGH (ref 0.44–1.00)
GFR calc Af Amer: 38 mL/min — ABNORMAL LOW (ref >60)
GFR calc non Af Amer: 33 mL/min — ABNORMAL LOW (ref >60)

## 2018-02-08 LAB — CULTURE, BLOOD (ROUTINE X 2)
Culture: NO GROWTH
Culture: NO GROWTH
Special Requests: ADEQUATE
Special Requests: ADEQUATE

## 2018-02-08 NOTE — Evaluation (Signed)
Physical Therapy Evaluation Patient Details Name: Regina Bush MRN: [id number] DOB: [birth year] Today's Date: 02/08/2018   History of Present Illness  Regina Bush is a 84 y.o. female with medical history significant of cad, dementia, dm, htn sent in from snf for fever over 102 with sob , weakness and cough for at least a day.  Pt denies any n/v/d.  No urinary symptoms.  Pt normally ambulates at her baseline but was weak today and could not do so. Pt referred for admission for likely pna.  vss.    Clinical Impression  Patient slightly apprehensive and required much assistance to pull self up to bedside, limited to a few slow unsteady steps at bedside to transfer to chair and declined to attempt ambulation out of room due to c/o fatigue.  Patient tolerated sitting up in chair after therapy.  Patient present with nasal canula off and on room air throughout visit with O2 saturation at 96-98% after therapy - RN notified.    Follow Up Recommendations SNF;Supervision/Assistance - 24 hour;Supervision for mobility/OOB    Equipment Recommendations  None recommended by PT    Recommendations for Other Services       Precautions / Restrictions Precautions Precautions: Fall Restrictions Weight Bearing Restrictions: No      Mobility  Bed Mobility Overal bed mobility: Needs Assistance Bed Mobility: Supine to Sit     Supine to sit: Mod assist     General bed mobility comments: slow labored movement   Transfers Overall transfer level: Needs assistance Equipment used: Rolling walker (2 wheeled) Transfers: Sit to/from [facility] Sit to Stand: Min assist;Mod assist Stand pivot transfers: Min assist;Mod assist       General transfer comment: unsteady on feet  Ambulation/Gait Ambulation/Gait assistance: Mod assist Gait Distance (Feet): 3 Feet Assistive device: Rolling walker (2 wheeled) Gait Pattern/deviations: Decreased step length - left;Decreased step length -  right;Decreased stride length Gait velocity: slow   General Gait Details: limited to 3-4 unsteady slow labored steps, limited secondary to fatigue and declined to attempt walking out of room  Stairs            Wheelchair Mobility    Modified Rankin (Stroke Patients Only)       Balance Overall balance assessment: Needs assistance Sitting-balance support: Feet supported;No upper extremity supported Sitting balance-Leahy Scale: Fair     Standing balance support: During functional activity;Bilateral upper extremity supported Standing balance-Leahy Scale: Poor Standing balance comment: fair/poor with RW                             Pertinent Vitals/Pain Pain Assessment: No/denies pain    Home Living Family/patient expects to be discharged to:: Assisted living               Home Equipment: Walker - 2 wheels;Wheelchair - manual      Prior Function Level of Independence: Needs assistance   Gait / Transfers Assistance Needed: household ambulator with RW  ADL's / Homemaking Assistance Needed: assisted by ALF staff        Hand Dominance        Extremity/Trunk Assessment   Upper Extremity Assessment Upper Extremity Assessment: Generalized weakness    Lower Extremity Assessment Lower Extremity Assessment: Generalized weakness    Cervical / Trunk Assessment Cervical / Trunk Assessment: Normal  Communication   Communication: No difficulties  Cognition Arousal/Alertness: Awake/alert Behavior During Therapy: WFL for tasks assessed/performed Overall Cognitive Status: History of cognitive  impairments - at baseline                                        General Comments      Exercises     Assessment/Plan    PT Assessment Patient needs continued PT services  PT Problem List Decreased strength;Decreased activity tolerance;Decreased balance;Decreased mobility       PT Treatment Interventions Gait training;Functional  mobility training;Therapeutic activities;Therapeutic exercise;Patient/family education;Stair training;Wheelchair mobility training    PT Goals (Current goals can be found in the Care Plan section)  Acute Rehab PT Goals Patient Stated Goal: return home PT Goal Formulation: With patient/family Time For Goal Achievement: 02/22/18 Potential to Achieve Goals: Good    Frequency Min 3X/week   Barriers to discharge        Co-evaluation               AM-PAC PT "6 Clicks" Mobility  Outcome Measure Help needed turning from your back to your side while in a flat bed without using bedrails?: Total Help needed moving from lying on your back to sitting on the side of a flat bed without using bedrails?: Total Help needed moving to and from a bed to a chair (including a wheelchair)?: Total Help needed standing up from a chair using your arms (e.g., wheelchair or bedside chair)?: A Lot Help needed to walk in hospital room?: A Lot Help needed climbing 3-5 steps with a railing? : Total 6 Click Score: 8    End of Session   Activity Tolerance: Patient limited by fatigue Patient left: in chair;with call bell/phone within reach;with chair alarm set Nurse Communication: Mobility status PT Visit Diagnosis: Unsteadiness on feet (R26.81);Other abnormalities of gait and mobility (R26.89);Muscle weakness (generalized) (M62.81)    Time: 7290-2111 PT Time Calculation (min) (ACUTE ONLY): 32 min   Charges:   PT Evaluation $PT Eval Moderate Complexity: 1 Mod PT Treatments $Therapeutic Activity: 23-37 mins        3:53 PM, 02/08/18 Lonell Grandchild, MPT Physical Therapist with [facility] [phone] office [phone] mobile phone

## 2018-02-08 NOTE — NC FL2 (Signed)
[place] LEVEL OF CARE SCREENING TOOL     IDENTIFICATION  Patient Name: Regina Bush Birthdate: [birth year] Sex: female Admission Date (Current Location): 02/03/2018  [facility] and [place] Number:  [employer] and Address:  [place] [address] [place]      Provider Number: [id number]  Attending Physician Name and Address:  Sinda Du, MD  Relative Name and Phone Number:       Current Level of Care: Hospital Recommended Level of Care: [place] Prior Approval Number:    Date Approved/Denied:   PASRR Number: [id number] ([id number] )  Discharge Plan: SNF    Current Diagnoses: Patient Active Problem List   Diagnosis Date Noted  . Pneumonia 02/04/2018  . PNA (pneumonia) 02/03/2018  . Hypothyroidism 11/19/2016  . History of CVA in adulthood 11/19/2016  . UTI (urinary tract infection) 11/15/2016  . ARF (acute renal failure) ([place]) 11/15/2016  . Hyponatremia 11/15/2016  . HCAP (healthcare-associated pneumonia) 08/19/2016  . Subdural hematoma ([place])   . Hypertension   . Diabetes mellitus without complication ([place])   . Coronary artery disease     Orientation RESPIRATION BLADDER Height & Weight     Self  O2(3L) Continent Weight: 185 lb 3 oz (84 kg) Height:  5\' 3"  (160 cm)  BEHAVIORAL SYMPTOMS/MOOD NEUROLOGICAL BOWEL NUTRITION STATUS      Continent (heart healthy/carb modified)  AMBULATORY STATUS COMMUNICATION OF NEEDS Skin   Limited Assist Verbally Normal                       Personal Care Assistance Level of Assistance  Bathing, Feeding, Dressing Bathing Assistance: Limited assistance Feeding assistance: Independent Dressing Assistance: Limited assistance     Functional Limitations Info  Sight, Hearing, Speech Sight Info: Adequate Hearing Info: Adequate Speech Info: Adequate    SPECIAL CARE FACTORS FREQUENCY  PT (By licensed PT)     PT Frequency: 5x/week               Contractures Contractures Info: Not present    Additional Factors Info  Code Status, Allergies Code Status Info: DNR Allergies Info: Cymbalta, Effexor, Morphine and Related, Tape,            Current Medications (02/08/2018):  This is the current hospital active medication list Current Facility-Administered Medications  Medication Dose Route Frequency Provider Last Rate Last Dose  . 0.9 %  sodium chloride infusion  250 mL Intravenous PRN Phillips Grout, MD   Stopped at 02/06/18 [id number]  . acetaminophen (TYLENOL) tablet 650 mg  650 mg Oral Q6H PRN Phillips Grout, MD   650 mg at 02/07/18 0006  . amLODipine (NORVASC) tablet 10 mg  10 mg Oral Daily Derrill Kay A, MD   10 mg at 02/08/18 0902  . aspirin EC tablet 81 mg  81 mg Oral Daily Derrill Kay A, MD   81 mg at 02/08/18 0902  . atorvastatin (LIPITOR) tablet 20 mg  20 mg Oral Daily Derrill Kay A, MD   20 mg at 02/08/18 0902  . ceFEPIme (MAXIPIME) 1 g in sodium chloride 0.9 % 100 mL IVPB  1 g Intravenous Q24H Derrill Kay A, MD 200 mL/hr at 02/07/18 2148 1 g at 02/07/18 2148  . Chlorhexidine Gluconate Cloth 2 % PADS 6 each  6 each Topical Q0600 Sinda Du, MD   6 each at 02/08/18 0551  . cholecalciferol (VITAMIN D3) tablet 1,000 Units  1,000 Units Oral  Daily Phillips Grout, MD   1,000 Units at 02/08/18 3094  . famotidine (PEPCID) tablet 20 mg  20 mg Oral Daily Derrill Kay A, MD   20 mg at 02/08/18 0902  . fluticasone (FLONASE) 50 MCG/ACT nasal spray 2 spray  2 spray Each Nare Daily Phillips Grout, MD   2 spray at 02/08/18 0903  . guaiFENesin-dextromethorphan (ROBITUSSIN DM) 100-10 MG/5ML syrup 5 mL  5 mL Oral Q4H PRN Sinda Du, MD      . hydrALAZINE (APRESOLINE) tablet 25 mg  25 mg Oral TID Phillips Grout, MD   25 mg at 02/08/18 0902  . insulin aspart (novoLOG) injection 2-10 Units  2-10 Units Subcutaneous TID Phillips Grout, MD   2 Units at 02/05/18 1014  . insulin glargine (LANTUS) injection 12 Units  12 Units  Subcutaneous Daily Phillips Grout, MD   12 Units at 02/08/18 0902  . levothyroxine (SYNTHROID, LEVOTHROID) tablet 50 mcg  50 mcg Oral QAC breakfast Phillips Grout, MD   50 mcg at 02/08/18 0550  . magnesium oxide (MAG-OX) tablet 400 mg  400 mg Oral Daily Derrill Kay A, MD   400 mg at 02/08/18 0902  . multivitamin with minerals tablet 1 tablet  1 tablet Oral Daily Phillips Grout, MD   1 tablet at 02/08/18 0902  . mupirocin ointment (BACTROBAN) 2 % 1 application  1 application Nasal BID Sinda Du, MD   1 application at 07/68/08 [phone]  . nystatin (MYCOSTATIN/NYSTOP) topical powder   Topical BID Sinda Du, MD      . sodium chloride flush (NS) 0.9 % injection 3 mL  3 mL Intravenous Q12H Derrill Kay A, MD   3 mL at 02/08/18 0903  . sodium chloride flush (NS) 0.9 % injection 3 mL  3 mL Intravenous PRN Phillips Grout, MD      . traZODone (DESYREL) tablet 50 mg  50 mg Oral QHS Derrill Kay A, MD   50 mg at 02/07/18 2143  . vancomycin (VANCOCIN) IVPB 1000 mg/200 mL premix  1,000 mg Intravenous Q24H Sinda Du, MD 200 mL/hr at 02/08/18 0901 1,000 mg at 02/08/18 0901     Discharge Medications: Please see discharge summary for a list of discharge medications.  Relevant Imaging Results:  Relevant Lab Results:   Additional Information SSN [id number] Clydene Pugh, LCSW

## 2018-02-08 NOTE — Plan of Care (Signed)

## 2018-02-08 NOTE — Plan of Care (Signed)
  Problem: Acute Rehab PT Goals(only PT should resolve) Goal: Pt Will Go Supine/Side To Sit Outcome: Progressing Flowsheets (Taken 02/08/2018 1554) Pt will go Supine/Side to Sit: with minimal assist; with min guard assist Goal: Patient Will Transfer Sit To/From Stand Outcome: Progressing Flowsheets (Taken 02/08/2018 1554) Patient will transfer sit to/from stand: with minimal assist Goal: Pt Will Transfer Bed To Chair/Chair To Bed Outcome: Progressing Flowsheets (Taken 02/08/2018 1554) Pt will Transfer Bed to Chair/Chair to Bed: with min assist Goal: Pt Will Ambulate Outcome: Progressing Flowsheets (Taken 02/08/2018 1554) Pt will Ambulate: 25 feet; with minimal assist; with rolling walker   3:55 PM, 02/08/18 Lonell Grandchild, MPT Physical Therapist with [facility] [phone] office [phone] mobile phone

## 2018-02-08 NOTE — Progress Notes (Signed)
Subjective: She is about the same.  She is not as confused but is still confused.  She has no complaints.  Objective: Vital signs in last 24 hours: Temp:  [98.3 F (36.8 C)-98.5 F (36.9 C)] 98.3 F (36.8 C) (11/27 0635) Pulse Rate:  [68-72] 72 (11/27 0635) Resp:  [18-24] 20 (11/27 0635) BP: (138-163)/(51-71) 138/51 (11/27 0635) SpO2:  [96 %-98 %] 96 % (11/27 4132) Weight change:  Last BM Date: 02/06/18  Intake/Output from previous day: 11/26 0701 - 11/27 0700 In: 2201.9 [P.O.:360; IV Piggyback:1841.9] Out: 300 [Urine:300]  PHYSICAL EXAM General appearance: alert and mild distress Resp: rhonchi bilaterally Cardio: regular rate and rhythm, S1, S2 normal, no murmur, click, rub or gallop GI: soft, non-tender; bowel sounds normal; no masses,  no organomegaly Extremities: extremities normal, atraumatic, no cyanosis or edema  Lab Results:  Results for orders placed or performed during the hospital encounter of 02/03/18 (from the past 48 hour(s))  Glucose, capillary     Status: Abnormal   Collection Time: 02/06/18  9:52 AM  Result Value Ref Range   Glucose-Capillary 177 (H) 70 - 99 mg/dL  Glucose, capillary     Status: Abnormal   Collection Time: 02/06/18  4:06 PM  Result Value Ref Range   Glucose-Capillary 103 (H) 70 - 99 mg/dL  Glucose, capillary     Status: Abnormal   Collection Time: 02/06/18 10:09 PM  Result Value Ref Range   Glucose-Capillary 148 (H) 70 - 99 mg/dL   Comment 1 Notify RN    Comment 2 Document in Chart   Vancomycin, trough     Status: None   Collection Time: 02/07/18  7:20 AM  Result Value Ref Range   Vancomycin Tr 16 15 - 20 ug/mL    Comment: Performed at [facility], [address] [place], [place] [address]  Glucose, capillary     Status: Abnormal   Collection Time: 02/07/18  7:52 AM  Result Value Ref Range   Glucose-Capillary 106 (H) 70 - 99 mg/dL  Glucose, capillary     Status: Abnormal   Collection Time: 02/07/18 10:52 AM  Result Value Ref  Range   Glucose-Capillary 153 (H) 70 - 99 mg/dL  Glucose, capillary     Status: Abnormal   Collection Time: 02/07/18 11:13 AM  Result Value Ref Range   Glucose-Capillary 169 (H) 70 - 99 mg/dL  Glucose, capillary     Status: Abnormal   Collection Time: 02/07/18  4:14 PM  Result Value Ref Range   Glucose-Capillary 177 (H) 70 - 99 mg/dL   Comment 1 Notify RN    Comment 2 Document in Chart   Glucose, capillary     Status: Abnormal   Collection Time: 02/07/18  9:06 PM  Result Value Ref Range   Glucose-Capillary 164 (H) 70 - 99 mg/dL   Comment 1 Notify RN    Comment 2 Document in Chart   Creatinine, serum     Status: Abnormal   Collection Time: 02/08/18  5:30 AM  Result Value Ref Range   Creatinine, Ser 1.43 (H) 0.44 - 1.00 mg/dL   GFR calc non Af Amer 33 (L) >60 mL/min   GFR calc Af Amer 38 (L) >60 mL/min    Comment: Performed at [facility], [address], [place], [place] [address]  Glucose, capillary     Status: Abnormal   Collection Time: 02/08/18  7:45 AM  Result Value Ref Range   Glucose-Capillary 119 (H) 70 - 99 mg/dL  ABGS No results for input(s): PHART, PO2ART, TCO2, HCO3 in the last 72 hours.  Invalid input(s): PCO2 CULTURES Recent Results (from the past 240 hour(s))  Urine culture     Status: Abnormal   Collection Time: 02/03/18  7:44 PM  Result Value Ref Range Status   Specimen Description   Final    URINE, CATHETERIZED Performed at [facility], [address], [place], [place] [address]    Special Requests   Final    NONE Performed at [facility], [address] [place], [place] [address]    Culture (A)  Final    <10,000 COLONIES/mL INSIGNIFICANT GROWTH Performed at [facility] [address] [place], [place] [address]    Report Status 02/05/2018 FINAL  Final  Blood Culture (routine x 2)     Status: None   Collection Time: 02/03/18  7:54 PM  Result Value Ref Range Status   Specimen Description BLOOD IV SITE  Final   Special Requests    Final    BOTTLES DRAWN AEROBIC AND ANAEROBIC Blood Culture adequate volume   Culture   Final    NO GROWTH 5 DAYS Performed at [facility], [address] [place], [place] [address]    Report Status 02/08/2018 FINAL  Final  Blood Culture (routine x 2)     Status: None   Collection Time: 02/03/18  8:23 PM  Result Value Ref Range Status   Specimen Description BLOOD RIGHT HAND  Final   Special Requests   Final    BOTTLES DRAWN AEROBIC AND ANAEROBIC Blood Culture adequate volume   Culture   Final    NO GROWTH 5 DAYS Performed at [facility], [address], [place], [place] [address]    Report Status 02/08/2018 FINAL  Final  MRSA PCR Screening     Status: Abnormal   Collection Time: 02/04/18  2:00 AM  Result Value Ref Range Status   MRSA by PCR POSITIVE (A) NEGATIVE Final    Comment:        The GeneXpert MRSA Assay (FDA approved for NASAL specimens only), is one component of a comprehensive MRSA colonization surveillance program. It is not intended to diagnose MRSA infection nor to guide or monitor treatment for MRSA infections. RESULT CALLED TO, READ BACK BY AND VERIFIED WITH: GADDY,C @ 0514 ON 02/04/18 BY JUW Performed at [facility], [address], [place], [place] [address]    Studies/Results: Dg Chest Port 1 View  Result Date: 02/06/2018 CLINICAL DATA:  Pneumonia, shortness of breath, fever EXAM: PORTABLE CHEST 1 VIEW COMPARISON:  02/03/2018 FINDINGS: Cardiomegaly with vascular congestion and mild bilateral interstitial and alveolar opacities, similar prior study most compatible with CHF. Possible small effusions. No acute bony abnormality. Low lung volumes. IMPRESSION: Continued mild CHF, unchanged. Question small effusions. Electronically Signed   By: Rolm Baptise M.D.   On: 02/06/2018 10:14    Medications:  Prior to Admission:  Medications Prior to Admission  Medication Sig Dispense Refill Last Dose  . acetaminophen (TYLENOL) 325 MG tablet Take 2 tablets (650 mg  total) by mouth every 6 (six) hours as needed for mild pain or moderate pain. 60 tablet 3 UNKNOWN  . amLODipine (NORVASC) 10 MG tablet Take 10 mg by mouth daily.    02/02/2018 at Unknown time  . amoxicillin (AMOXIL) 500 MG capsule Take 500 mg by mouth 3 (three) times daily. Give 1 capsule by mouth three times a day for UTI for 10 days.   02/02/2018 at  Unknown time  . aspirin EC 81 MG tablet Take 81 mg by mouth daily.   02/02/2018 at Unknown time  . atorvastatin (LIPITOR) 20 MG tablet Take 20 mg by mouth daily.    02/02/2018 at Unknown time  . cetirizine (ZYRTEC) 10 MG tablet Take 10 mg by mouth at bedtime as needed for allergies.     . cholecalciferol (VITAMIN D) 1000 units tablet Take 1,000 Units by mouth daily.    02/02/2018 at Unknown time  . clotrimazole-betamethasone (LOTRISONE) cream Apply 1 application topically 2 (two) times daily.   02/02/2018 at Unknown time  . dextromethorphan-guaiFENesin (ROBITUSSIN-DM) 10-100 MG/5ML liquid Take 5 mLs by mouth every 4 (four) hours as needed for cough. Give 5cc by mouth every 4 hours as needed for cough.   not in past 5 days  . dextrose (GLUTOSE 15) 40 % GEL Take 1 Tube by mouth. Give 1 application by mouth as needed for blood sugar less than 60 recheck blood sugar in 15 minutes; notify MD if blood sugar doesn't increase.   not in past 5 days  . famotidine (PEPCID) 20 MG tablet Take 20 mg by mouth daily.    02/02/2018 at Unknown time  . fluticasone (FLONASE) 50 MCG/ACT nasal spray Place 2 sprays into both nostrils daily.   02/02/2018 at Unknown time  . hydrALAZINE (APRESOLINE) 25 MG tablet Take 25 mg by mouth 3 (three) times daily. Give 1 tablet by mouth three times a day for HTN hold for systolic BP <993   71/69/6789 at Unknown time  . insulin glargine (LANTUS) 100 UNIT/ML injection Inject 0.12 mLs (12 Units total) into the skin daily. (Patient taking differently: Inject 28 Units into the skin every morning. ) 10 mL 11 02/03/2018 at Unknown time  .  levothyroxine (SYNTHROID, LEVOTHROID) 50 MCG tablet Take 50 mcg by mouth daily before breakfast.    02/02/2018 at Unknown time  . magnesium oxide (MAG-OX) 400 MG tablet Take 400 mg by mouth daily.    02/02/2018 at Unknown time  . Multiple Vitamin (MULTIVITAMIN WITH MINERALS) TABS tablet Take 1 tablet by mouth daily.    02/02/2018 at Unknown time  . naproxen sodium (ALEVE) 220 MG tablet Take 220 mg by mouth every 12 (twelve) hours as needed.   not in past 5 days  . traZODone (DESYREL) 50 MG tablet Take 50 mg by mouth at bedtime.   02/02/2018 at Unknown time  . trimethoprim (TRIMPEX) 100 MG tablet Take 100 mg by mouth daily. Give 1 tablet by mouth one time a day for preventative.   02/03/2018 at Unknown time  . Wheat Dextrin (BENEFIBER PO) Take 10 mLs by mouth daily.    02/02/2018 at Unknown time  . insulin aspart (NOVOLOG FLEXPEN) 100 UNIT/ML FlexPen Inject 2-10 Units into the skin 3 (three) times daily. Give per sliding scale three times a day:  201-250= 2 UNITS 251-300= 4 UNITS 301-350= 6 UNITS 351-400= 8 UNITS 401-450=10UNITS   Not Taking at Unknown time   Scheduled: . amLODipine  10 mg Oral Daily  . aspirin EC  81 mg Oral Daily  . atorvastatin  20 mg Oral Daily  . Chlorhexidine Gluconate Cloth  6 each Topical Q0600  . cholecalciferol  1,000 Units Oral Daily  . famotidine  20 mg Oral Daily  . fluticasone  2 spray Each Nare Daily  . hydrALAZINE  25 mg Oral TID  . insulin aspart  2-10 Units Subcutaneous TID  . insulin glargine  12 Units Subcutaneous Daily  .  levothyroxine  50 mcg Oral QAC breakfast  . magnesium oxide  400 mg Oral Daily  . multivitamin with minerals  1 tablet Oral Daily  . mupirocin ointment  1 application Nasal BID  . nystatin   Topical BID  . sodium chloride flush  3 mL Intravenous Q12H  . traZODone  50 mg Oral QHS   Continuous: . sodium chloride Stopped (02/06/18 0521)  . ceFEPime (MAXIPIME) IV 1 g (02/07/18 2148)  . vancomycin 1,000 mg (02/08/18 0901)    XIH:WTUUEK chloride, acetaminophen, guaiFENesin-dextromethorphan, sodium chloride flush  Assesment: She was admitted with healthcare associated pneumonia.  She is improving.  She has diabetes on insulin which is doing okay  She has hypertension which is controlled  She has dementia which gives her more trouble when she is sick Principal Problem:   HCAP (healthcare-associated pneumonia) Active Problems:   Coronary artery disease   History of CVA in adulthood   Pneumonia    Plan: Continue current treatments    LOS: 4 days   Katrinka Herbison L 02/08/2018, 9:16 AM

## 2018-02-08 NOTE — Clinical Social Work Placement (Signed)
   CLINICAL SOCIAL WORK PLACEMENT  NOTE  Date:  02/08/2018  Patient Details  Name: Elizabethanne Lusher MRN: [id number] Date of Birth: 05-24-1927  Clinical Social Work is seeking post-discharge placement for this patient at the [place] level of care (*CSW will initial, date and re-position this form in  chart as items are completed):  Yes   Patient/family provided with [place] Work Department's list of facilities offering this level of care within the geographic area requested by the patient (or if unable, by the patient's family).  Yes   Patient/family informed of their freedom to choose among providers that offer the needed level of care, that participate in Medicare, Medicaid or managed care program needed by the patient, have an available bed and are willing to accept the patient.  Yes   Patient/family informed of [place]'s ownership interest in [facility] and [facility], as well as of the fact that they are under no obligation to receive care at these facilities.  PASRR submitted to EDS on       PASRR number received on       Existing PASRR number confirmed on 02/08/18     FL2 transmitted to all facilities in geographic area requested by pt/family on 02/08/18     FL2 transmitted to all facilities within larger geographic area on       Patient informed that his/her managed care company has contracts with or will negotiate with certain facilities, including the following:            Patient/family informed of bed offers received.  Patient chooses bed at       Physician recommends and patient chooses bed at      Patient to be transferred to   on  .  Patient to be transferred to facility by       Patient family notified on   of transfer.  Name of family member notified:        PHYSICIAN       Additional Comment:  Please cosign FL2 _______________________________________________ Ihor Gully, LCSW 02/08/2018, 4:45  PM

## 2018-02-08 NOTE — Clinical Social Work Note (Signed)
LCSW spoke with patient's daughter, Butch Penny. Patient is a long term resident at [facility] PT recommendation of SNF was discussed. Butch Penny is interested in patient going to SNF for short term rehab and provided choices.   LCSW faxed referral to SNF choices.   Maie Kesinger, Clydene Pugh, LCSW

## 2018-02-09 LAB — GLUCOSE, CAPILLARY
GLUCOSE-CAPILLARY: 141 mg/dL — AB (ref 70–99)
Glucose-Capillary: 133 mg/dL — ABNORMAL HIGH (ref 70–99)
Glucose-Capillary: 115 mg/dL — ABNORMAL HIGH (ref 70–99)
Glucose-Capillary: 130 mg/dL — ABNORMAL HIGH (ref 70–99)

## 2018-02-09 MED ORDER — AMOXICILLIN-POT CLAVULANATE 875-125 MG PO TABS
1.0000 | ORAL_TABLET | Freq: Two times a day (BID) | ORAL | Status: DC
  Administered 2018-02-09: 1 via ORAL
  Filled 2018-02-09 (×2): qty 1

## 2018-02-09 MED ORDER — SACCHAROMYCES BOULARDII 250 MG PO CAPS
250.0000 mg | ORAL_CAPSULE | Freq: Two times a day (BID) | ORAL | Status: DC
  Administered 2018-02-09 – 2018-02-16 (×14): 250 mg via ORAL
  Filled 2018-02-09 (×15): qty 1

## 2018-02-09 NOTE — Plan of Care (Signed)

## 2018-02-09 NOTE — Progress Notes (Signed)
Subjective: She says she feels fairly well.  She is still very weak.  PT evaluation was done and it is recommended that she go to skilled care facility.  Objective: Vital signs in last 24 hours: Temp:  [97.3 F (36.3 C)-98.3 F (36.8 C)] 97.7 F (36.5 C) (11/28 0520) Pulse Rate:  [64-74] 64 (11/28 0520) Resp:  [18-20] 18 (11/28 0520) BP: (152-159)/(58-113) 154/61 (11/28 0600) SpO2:  [95 %-96 %] 96 % (11/28 0520) Weight change:  Last BM Date: 02/06/18  Intake/Output from previous day: 11/27 0701 - 11/28 0700 In: 240 [P.O.:240] Out: 350 [Urine:350]  PHYSICAL EXAM General appearance: alert, cooperative, mild distress and Mildly confused Resp: rhonchi bilaterally Cardio: regular rate and rhythm, S1, S2 normal, no murmur, click, rub or gallop GI: soft, non-tender; bowel sounds normal; no masses,  no organomegaly Extremities: extremities normal, atraumatic, no cyanosis or edema  Lab Results:  Results for orders placed or performed during the hospital encounter of 02/03/18 (from the past 48 hour(s))  Glucose, capillary     Status: Abnormal   Collection Time: 02/07/18  4:14 PM  Result Value Ref Range   Glucose-Capillary 177 (H) 70 - 99 mg/dL   Comment 1 Notify RN    Comment 2 Document in Chart   Glucose, capillary     Status: Abnormal   Collection Time: 02/07/18  9:06 PM  Result Value Ref Range   Glucose-Capillary 164 (H) 70 - 99 mg/dL   Comment 1 Notify RN    Comment 2 Document in Chart   Creatinine, serum     Status: Abnormal   Collection Time: 02/08/18  5:30 AM  Result Value Ref Range   Creatinine, Ser 1.43 (H) 0.44 - 1.00 mg/dL   GFR calc non Af Amer 33 (L) >60 mL/min   GFR calc Af Amer 38 (L) >60 mL/min    Comment: Performed at [facility], [address], [place], [place] [address]  Glucose, capillary     Status: Abnormal   Collection Time: 02/08/18  7:45 AM  Result Value Ref Range   Glucose-Capillary 119 (H) 70 - 99 mg/dL  Glucose, capillary     Status: Abnormal    Collection Time: 02/08/18 11:54 AM  Result Value Ref Range   Glucose-Capillary 174 (H) 70 - 99 mg/dL  Glucose, capillary     Status: Abnormal   Collection Time: 02/08/18  4:15 PM  Result Value Ref Range   Glucose-Capillary 131 (H) 70 - 99 mg/dL   Comment 1 Notify RN    Comment 2 Document in Chart   Glucose, capillary     Status: Abnormal   Collection Time: 02/08/18  9:35 PM  Result Value Ref Range   Glucose-Capillary 151 (H) 70 - 99 mg/dL   Comment 1 Notify RN    Comment 2 Document in Chart   Glucose, capillary     Status: Abnormal   Collection Time: 02/09/18  8:20 AM  Result Value Ref Range   Glucose-Capillary 133 (H) 70 - 99 mg/dL   Comment 1 Notify RN   Glucose, capillary     Status: Abnormal   Collection Time: 02/09/18 11:13 AM  Result Value Ref Range   Glucose-Capillary 141 (H) 70 - 99 mg/dL    ABGS No results for input(s): PHART, PO2ART, TCO2, HCO3 in the last 72 hours.  Invalid input(s): PCO2 CULTURES Recent Results (from the past 240 hour(s))  Urine culture     Status: Abnormal   Collection Time: 02/03/18  7:44 PM  Result Value Ref Range Status   Specimen Description   Final    URINE, CATHETERIZED Performed at [facility], [address], [place], [place] [address]    Special Requests   Final    NONE Performed at [facility], [address], [place], [place] [address]    Culture (A)  Final    <10,000 COLONIES/mL INSIGNIFICANT GROWTH Performed at [facility] [address] [place], [place] [address]    Report Status 02/05/2018 FINAL  Final  Blood Culture (routine x 2)     Status: None   Collection Time: 02/03/18  7:54 PM  Result Value Ref Range Status   Specimen Description BLOOD IV SITE  Final   Special Requests   Final    BOTTLES DRAWN AEROBIC AND ANAEROBIC Blood Culture adequate volume   Culture   Final    NO GROWTH 5 DAYS Performed at [facility], [address] [place], [place] [address]    Report Status 02/08/2018 FINAL  Final   Blood Culture (routine x 2)     Status: None   Collection Time: 02/03/18  8:23 PM  Result Value Ref Range Status   Specimen Description BLOOD RIGHT HAND  Final   Special Requests   Final    BOTTLES DRAWN AEROBIC AND ANAEROBIC Blood Culture adequate volume   Culture   Final    NO GROWTH 5 DAYS Performed at [facility], [address], [place], [place] [address]    Report Status 02/08/2018 FINAL  Final  MRSA PCR Screening     Status: Abnormal   Collection Time: 02/04/18  2:00 AM  Result Value Ref Range Status   MRSA by PCR POSITIVE (A) NEGATIVE Final    Comment:        The GeneXpert MRSA Assay (FDA approved for NASAL specimens only), is one component of a comprehensive MRSA colonization surveillance program. It is not intended to diagnose MRSA infection nor to guide or monitor treatment for MRSA infections. RESULT CALLED TO, READ BACK BY AND VERIFIED WITH: GADDY,C @ 0514 ON 02/04/18 BY JUW Performed at [facility], [address] [place], [place] [address]    Studies/Results: No results found.  Medications:  Prior to Admission:  Medications Prior to Admission  Medication Sig Dispense Refill Last Dose  . acetaminophen (TYLENOL) 325 MG tablet Take 2 tablets (650 mg total) by mouth every 6 (six) hours as needed for mild pain or moderate pain. 60 tablet 3 UNKNOWN  . amLODipine (NORVASC) 10 MG tablet Take 10 mg by mouth daily.    02/02/2018 at Unknown time  . amoxicillin (AMOXIL) 500 MG capsule Take 500 mg by mouth 3 (three) times daily. Give 1 capsule by mouth three times a day for UTI for 10 days.   02/02/2018 at Unknown time  . aspirin EC 81 MG tablet Take 81 mg by mouth daily.   02/02/2018 at Unknown time  . atorvastatin (LIPITOR) 20 MG tablet Take 20 mg by mouth daily.    02/02/2018 at Unknown time  . cetirizine (ZYRTEC) 10 MG tablet Take 10 mg by mouth at bedtime as needed for allergies.     . cholecalciferol (VITAMIN D) 1000 units tablet Take 1,000 Units by mouth daily.     02/02/2018 at Unknown time  . clotrimazole-betamethasone (LOTRISONE) cream Apply 1 application topically 2 (two) times daily.   02/02/2018 at Unknown time  . dextromethorphan-guaiFENesin (ROBITUSSIN-DM) 10-100 MG/5ML liquid Take 5 mLs by mouth every 4 (four) hours as  needed for cough. Give 5cc by mouth every 4 hours as needed for cough.   not in past 5 days  . dextrose (GLUTOSE 15) 40 % GEL Take 1 Tube by mouth. Give 1 application by mouth as needed for blood sugar less than 60 recheck blood sugar in 15 minutes; notify MD if blood sugar doesn't increase.   not in past 5 days  . famotidine (PEPCID) 20 MG tablet Take 20 mg by mouth daily.    02/02/2018 at Unknown time  . fluticasone (FLONASE) 50 MCG/ACT nasal spray Place 2 sprays into both nostrils daily.   02/02/2018 at Unknown time  . hydrALAZINE (APRESOLINE) 25 MG tablet Take 25 mg by mouth 3 (three) times daily. Give 1 tablet by mouth three times a day for HTN hold for systolic BP <295   62/13/0865 at Unknown time  . insulin glargine (LANTUS) 100 UNIT/ML injection Inject 0.12 mLs (12 Units total) into the skin daily. (Patient taking differently: Inject 28 Units into the skin every morning. ) 10 mL 11 02/03/2018 at Unknown time  . levothyroxine (SYNTHROID, LEVOTHROID) 50 MCG tablet Take 50 mcg by mouth daily before breakfast.    02/02/2018 at Unknown time  . magnesium oxide (MAG-OX) 400 MG tablet Take 400 mg by mouth daily.    02/02/2018 at Unknown time  . Multiple Vitamin (MULTIVITAMIN WITH MINERALS) TABS tablet Take 1 tablet by mouth daily.    02/02/2018 at Unknown time  . naproxen sodium (ALEVE) 220 MG tablet Take 220 mg by mouth every 12 (twelve) hours as needed.   not in past 5 days  . traZODone (DESYREL) 50 MG tablet Take 50 mg by mouth at bedtime.   02/02/2018 at Unknown time  . trimethoprim (TRIMPEX) 100 MG tablet Take 100 mg by mouth daily. Give 1 tablet by mouth one time a day for preventative.   02/03/2018 at Unknown time  . Wheat Dextrin  (BENEFIBER PO) Take 10 mLs by mouth daily.    02/02/2018 at Unknown time  . insulin aspart (NOVOLOG FLEXPEN) 100 UNIT/ML FlexPen Inject 2-10 Units into the skin 3 (three) times daily. Give per sliding scale three times a day:  201-250= 2 UNITS 251-300= 4 UNITS 301-350= 6 UNITS 351-400= 8 UNITS 401-450=10UNITS   Not Taking at Unknown time   Scheduled: . amLODipine  10 mg Oral Daily  . aspirin EC  81 mg Oral Daily  . atorvastatin  20 mg Oral Daily  . Chlorhexidine Gluconate Cloth  6 each Topical Q0600  . cholecalciferol  1,000 Units Oral Daily  . famotidine  20 mg Oral Daily  . fluticasone  2 spray Each Nare Daily  . hydrALAZINE  25 mg Oral TID  . insulin aspart  2-10 Units Subcutaneous TID  . insulin glargine  12 Units Subcutaneous Daily  . levothyroxine  50 mcg Oral QAC breakfast  . magnesium oxide  400 mg Oral Daily  . multivitamin with minerals  1 tablet Oral Daily  . mupirocin ointment  1 application Nasal BID  . nystatin   Topical BID  . sodium chloride flush  3 mL Intravenous Q12H  . traZODone  50 mg Oral QHS   Continuous: . sodium chloride Stopped (02/06/18 0521)  . ceFEPime (MAXIPIME) IV 1 g (02/08/18 2239)  . vancomycin 1,000 mg (02/09/18 0921)   HQI:ONGEXB chloride, acetaminophen, guaiFENesin-dextromethorphan, sodium chloride flush  Assesment: She was admitted with healthcare associated pneumonia.  She is slowly improving.  She has had previous stroke which is stable  She has diabetes she is on sliding scale and that seems to be controlled at this point  She has dementia and she gets confused when she is in the hospital but otherwise usually does pretty well  She has deconditioning and it has been recommended that she go to skilled care facility Principal Problem:   HCAP (healthcare-associated pneumonia) Active Problems:   Coronary artery disease   History of CVA in adulthood   Pneumonia    Plan: Continue treatments.  Discontinue vancomycin.  Switch to  oral antibiotics.    LOS: 5 days   Larkin Alfred L 02/09/2018, 12:53 PM

## 2018-02-10 ENCOUNTER — Inpatient Hospital Stay (HOSPITAL_COMMUNITY): Payer: Medicare Other

## 2018-02-10 LAB — CREATININE, SERUM
Creatinine, Ser: 1.5 mg/dL — ABNORMAL HIGH (ref 0.44–1.00)
GFR calc Af Amer: 36 mL/min — ABNORMAL LOW (ref >60)
GFR calc non Af Amer: 31 mL/min — ABNORMAL LOW (ref >60)

## 2018-02-10 LAB — URINALYSIS, DIPSTICK ONLY
BILIRUBIN URINE: NEGATIVE
Glucose, UA: 50 mg/dL — AB
HGB URINE DIPSTICK: NEGATIVE
Ketones, ur: NEGATIVE mg/dL
Leukocytes, UA: NEGATIVE
Nitrite: NEGATIVE
PH: 5 (ref 5.0–8.0)
Protein, ur: >=300 mg/dL — AB
SPECIFIC GRAVITY, URINE: 1.014 (ref 1.005–1.030)

## 2018-02-10 LAB — CBC WITH DIFFERENTIAL/PLATELET
ABS IMMATURE GRANULOCYTES: 0.07 10*3/uL (ref 0.00–0.07)
BASOS PCT: 1 %
Basophils Absolute: 0.1 10*3/uL (ref 0.0–0.1)
Eosinophils Absolute: 0.2 10*3/uL (ref 0.0–0.5)
Eosinophils Relative: 3 %
HCT: 42.7 % (ref 36.0–46.0)
Hemoglobin: 13 g/dL (ref 12.0–15.0)
IMMATURE GRANULOCYTES: 1 %
Lymphocytes Relative: 10 %
Lymphs Abs: 0.9 10*3/uL (ref 0.7–4.0)
MCH: 29.9 pg (ref 26.0–34.0)
MCHC: 30.4 g/dL (ref 30.0–36.0)
MCV: 98.2 fL (ref 80.0–100.0)
MONOS PCT: 7 %
Monocytes Absolute: 0.7 10*3/uL (ref 0.1–1.0)
NEUTROS ABS: 7.3 10*3/uL (ref 1.7–7.7)
NEUTROS PCT: 78 %
PLATELETS: 269 10*3/uL (ref 150–400)
RBC: 4.35 MIL/uL (ref 3.87–5.11)
RDW: 13.1 % (ref 11.5–15.5)
WBC: 9.1 10*3/uL (ref 4.0–10.5)
nRBC: 0 % (ref 0.0–0.2)

## 2018-02-10 LAB — GLUCOSE, CAPILLARY
GLUCOSE-CAPILLARY: 122 mg/dL — AB (ref 70–99)
GLUCOSE-CAPILLARY: 113 mg/dL — AB (ref 70–99)
GLUCOSE-CAPILLARY: 103 mg/dL — AB (ref 70–99)
Glucose-Capillary: 106 mg/dL — ABNORMAL HIGH (ref 70–99)

## 2018-02-10 LAB — AMMONIA: Ammonia: <9 umol/L — ABNORMAL LOW (ref 9–35)

## 2018-02-10 LAB — COMPREHENSIVE METABOLIC PANEL
ALBUMIN: 3.6 g/dL (ref 3.5–5.0)
ALT: 17 U/L (ref 0–44)
AST: 17 U/L (ref 15–41)
Alkaline Phosphatase: 64 U/L (ref 38–126)
Anion gap: 9 (ref 5–15)
BUN: 38 mg/dL — AB (ref 8–23)
CHLORIDE: 112 mmol/L — AB (ref 98–111)
CO2: 22 mmol/L (ref 22–32)
CREATININE: 1.6 mg/dL — AB (ref 0.44–1.00)
Calcium: 9.1 mg/dL (ref 8.9–10.3)
GFR calc Af Amer: 33 mL/min — ABNORMAL LOW (ref >60)
GFR calc non Af Amer: 28 mL/min — ABNORMAL LOW (ref >60)
Glucose, Bld: 133 mg/dL — ABNORMAL HIGH (ref 70–99)
Potassium: 4.4 mmol/L (ref 3.5–5.1)
SODIUM: 143 mmol/L (ref 135–145)
Total Bilirubin: 0.8 mg/dL (ref 0.3–1.2)
Total Protein: 7.2 g/dL (ref 6.5–8.1)

## 2018-02-10 MED ORDER — AMOXICILLIN-POT CLAVULANATE 500-125 MG PO TABS
1.0000 | ORAL_TABLET | Freq: Two times a day (BID) | ORAL | Status: DC
  Administered 2018-02-10 – 2018-02-11 (×3): 500 mg via ORAL
  Filled 2018-02-10 (×2): qty 1

## 2018-02-10 NOTE — Care Management Important Message (Signed)
Important Message  Patient Details  Name: Regina Bush MRN: [id number] Date of Birth: 1927/09/20   Medicare Important Message Given:  Yes    Karene Bracken, Chauncey Reading, RN 02/10/2018, 1:32 PM

## 2018-02-10 NOTE — Progress Notes (Signed)
Subjective: She is complaining of abdominal pain this morning.  This is in the left lower quadrant area.  She is had several bowel movements but is not having diarrhea.  She has no other new complaints.  She is still very weak.  She is sleepy this morning.  Objective: Vital signs in last 24 hours: Temp:  [97.5 F (36.4 C)-98.8 F (37.1 C)] 97.5 F (36.4 C) (11/29 1540) Pulse Rate:  [70-77] 77 (11/29 0614) Resp:  [13-19] 13 (11/29 0614) BP: (141-170)/(59-82) 153/82 (11/29 0614) SpO2:  [92 %-97 %] 96 % (11/29 0614) Weight:  [85.5 kg] 85.5 kg (11/29 0129) Weight change:  Last BM Date: 02/06/18  Intake/Output from previous day: 11/28 0701 - 11/29 0700 In: 120 [P.O.:120] Out: 500 [Urine:500]  PHYSICAL EXAM General appearance: She is awake and complains of abdominal pain when asked.  However she does go back to sleep pretty quickly Resp: rhonchi bilaterally Cardio: regular rate and rhythm, S1, S2 normal, no murmur, click, rub or gallop GI: She has some tenderness in the lower quadrants bilaterally and some fullness in the left lower quadrant Extremities: extremities normal, atraumatic, no cyanosis or edema  Lab Results:  Results for orders placed or performed during the hospital encounter of 02/03/18 (from the past 48 hour(s))  Glucose, capillary     Status: Abnormal   Collection Time: 02/08/18  4:15 PM  Result Value Ref Range   Glucose-Capillary 131 (H) 70 - 99 mg/dL   Comment 1 Notify RN    Comment 2 Document in Chart   Glucose, capillary     Status: Abnormal   Collection Time: 02/08/18  9:35 PM  Result Value Ref Range   Glucose-Capillary 151 (H) 70 - 99 mg/dL   Comment 1 Notify RN    Comment 2 Document in Chart   Glucose, capillary     Status: Abnormal   Collection Time: 02/09/18  8:20 AM  Result Value Ref Range   Glucose-Capillary 133 (H) 70 - 99 mg/dL   Comment 1 Notify RN   Glucose, capillary     Status: Abnormal   Collection Time: 02/09/18 11:13 AM  Result Value  Ref Range   Glucose-Capillary 141 (H) 70 - 99 mg/dL  Glucose, capillary     Status: Abnormal   Collection Time: 02/09/18  4:42 PM  Result Value Ref Range   Glucose-Capillary 115 (H) 70 - 99 mg/dL  Glucose, capillary     Status: Abnormal   Collection Time: 02/09/18  9:34 PM  Result Value Ref Range   Glucose-Capillary 130 (H) 70 - 99 mg/dL   Comment 1 Document in Chart   Creatinine, serum     Status: Abnormal   Collection Time: 02/10/18  6:31 AM  Result Value Ref Range   Creatinine, Ser 1.50 (H) 0.44 - 1.00 mg/dL   GFR calc non Af Amer 31 (L) >60 mL/min   GFR calc Af Amer 36 (L) >60 mL/min    Comment: Performed at [facility], [address] [place], [place] [address]  Glucose, capillary     Status: Abnormal   Collection Time: 02/10/18  7:33 AM  Result Value Ref Range   Glucose-Capillary 106 (H) 70 - 99 mg/dL  Glucose, capillary     Status: Abnormal   Collection Time: 02/10/18 11:15 AM  Result Value Ref Range   Glucose-Capillary 122 (H) 70 - 99 mg/dL    ABGS No results for input(s): PHART, PO2ART, TCO2, HCO3 in the last 72 hours.  Invalid input(s): PCO2  CULTURES Recent Results (from the past 240 hour(s))  Urine culture     Status: Abnormal   Collection Time: 02/03/18  7:44 PM  Result Value Ref Range Status   Specimen Description   Final    URINE, CATHETERIZED Performed at [facility], [address], [place], [place] [address]    Special Requests   Final    NONE Performed at [facility], [address], [place], [place] [address]    Culture (A)  Final    <10,000 COLONIES/mL INSIGNIFICANT GROWTH Performed at [facility] [address], [place], [place] [address]    Report Status 02/05/2018 FINAL  Final  Blood Culture (routine x 2)     Status: None   Collection Time: 02/03/18  7:54 PM  Result Value Ref Range Status   Specimen Description BLOOD IV SITE  Final   Special Requests   Final    BOTTLES DRAWN AEROBIC AND ANAEROBIC Blood Culture adequate volume    Culture   Final    NO GROWTH 5 DAYS Performed at [facility], [address] [place], [place] [address]    Report Status 02/08/2018 FINAL  Final  Blood Culture (routine x 2)     Status: None   Collection Time: 02/03/18  8:23 PM  Result Value Ref Range Status   Specimen Description BLOOD RIGHT HAND  Final   Special Requests   Final    BOTTLES DRAWN AEROBIC AND ANAEROBIC Blood Culture adequate volume   Culture   Final    NO GROWTH 5 DAYS Performed at [facility], [address], [place], [place] [address]    Report Status 02/08/2018 FINAL  Final  MRSA PCR Screening     Status: Abnormal   Collection Time: 02/04/18  2:00 AM  Result Value Ref Range Status   MRSA by PCR POSITIVE (A) NEGATIVE Final    Comment:        The GeneXpert MRSA Assay (FDA approved for NASAL specimens only), is one component of a comprehensive MRSA colonization surveillance program. It is not intended to diagnose MRSA infection nor to guide or monitor treatment for MRSA infections. RESULT CALLED TO, READ BACK BY AND VERIFIED WITH: GADDY,C @ 0514 ON 02/04/18 BY JUW Performed at [facility], [address], [place], [place] [address]    Studies/Results: Dg Abd 1 View  Result Date: 02/10/2018 CLINICAL DATA:  Abdominal pain. EXAM: ABDOMEN - 1 VIEW COMPARISON:  Ultrasound 11/16/2016.  05/03/2016. FINDINGS: Surgical clips noted over the abdomen. Soft tissue structures are unremarkable. No bowel distention. No free air. Aortoiliac atherosclerotic vascular calcification. Degenerative change lumbar spine and both hips IMPRESSION: No acute abnormality. Electronically Signed   By: Saxton   On: 02/10/2018 11:44    Medications:  Prior to Admission:  Medications Prior to Admission  Medication Sig Dispense Refill Last Dose  . acetaminophen (TYLENOL) 325 MG tablet Take 2 tablets (650 mg total) by mouth every 6 (six) hours as needed for mild pain or moderate pain. 60 tablet 3 UNKNOWN  . amLODipine (NORVASC)  10 MG tablet Take 10 mg by mouth daily.    02/02/2018 at Unknown time  . amoxicillin (AMOXIL) 500 MG capsule Take 500 mg by mouth 3 (three) times daily. Give 1 capsule by mouth three times a day for UTI for 10 days.   02/02/2018 at Unknown time  . aspirin EC 81 MG tablet Take 81 mg by mouth daily.   02/02/2018 at Unknown time  . atorvastatin (  LIPITOR) 20 MG tablet Take 20 mg by mouth daily.    02/02/2018 at Unknown time  . cetirizine (ZYRTEC) 10 MG tablet Take 10 mg by mouth at bedtime as needed for allergies.     . cholecalciferol (VITAMIN D) 1000 units tablet Take 1,000 Units by mouth daily.    02/02/2018 at Unknown time  . clotrimazole-betamethasone (LOTRISONE) cream Apply 1 application topically 2 (two) times daily.   02/02/2018 at Unknown time  . dextromethorphan-guaiFENesin (ROBITUSSIN-DM) 10-100 MG/5ML liquid Take 5 mLs by mouth every 4 (four) hours as needed for cough. Give 5cc by mouth every 4 hours as needed for cough.   not in past 5 days  . dextrose (GLUTOSE 15) 40 % GEL Take 1 Tube by mouth. Give 1 application by mouth as needed for blood sugar less than 60 recheck blood sugar in 15 minutes; notify MD if blood sugar doesn't increase.   not in past 5 days  . famotidine (PEPCID) 20 MG tablet Take 20 mg by mouth daily.    02/02/2018 at Unknown time  . fluticasone (FLONASE) 50 MCG/ACT nasal spray Place 2 sprays into both nostrils daily.   02/02/2018 at Unknown time  . hydrALAZINE (APRESOLINE) 25 MG tablet Take 25 mg by mouth 3 (three) times daily. Give 1 tablet by mouth three times a day for HTN hold for systolic BP <650   35/46/5681 at Unknown time  . insulin glargine (LANTUS) 100 UNIT/ML injection Inject 0.12 mLs (12 Units total) into the skin daily. (Patient taking differently: Inject 28 Units into the skin every morning. ) 10 mL 11 02/03/2018 at Unknown time  . levothyroxine (SYNTHROID, LEVOTHROID) 50 MCG tablet Take 50 mcg by mouth daily before breakfast.    02/02/2018 at Unknown time  .  magnesium oxide (MAG-OX) 400 MG tablet Take 400 mg by mouth daily.    02/02/2018 at Unknown time  . Multiple Vitamin (MULTIVITAMIN WITH MINERALS) TABS tablet Take 1 tablet by mouth daily.    02/02/2018 at Unknown time  . naproxen sodium (ALEVE) 220 MG tablet Take 220 mg by mouth every 12 (twelve) hours as needed.   not in past 5 days  . traZODone (DESYREL) 50 MG tablet Take 50 mg by mouth at bedtime.   02/02/2018 at Unknown time  . trimethoprim (TRIMPEX) 100 MG tablet Take 100 mg by mouth daily. Give 1 tablet by mouth one time a day for preventative.   02/03/2018 at Unknown time  . Wheat Dextrin (BENEFIBER PO) Take 10 mLs by mouth daily.    02/02/2018 at Unknown time  . insulin aspart (NOVOLOG FLEXPEN) 100 UNIT/ML FlexPen Inject 2-10 Units into the skin 3 (three) times daily. Give per sliding scale three times a day:  201-250= 2 UNITS 251-300= 4 UNITS 301-350= 6 UNITS 351-400= 8 UNITS 401-450=10UNITS   Not Taking at Unknown time   Scheduled: . amLODipine  10 mg Oral Daily  . amoxicillin-clavulanate  1 tablet Oral BID  . aspirin EC  81 mg Oral Daily  . atorvastatin  20 mg Oral Daily  . Chlorhexidine Gluconate Cloth  6 each Topical Q0600  . cholecalciferol  1,000 Units Oral Daily  . famotidine  20 mg Oral Daily  . fluticasone  2 spray Each Nare Daily  . hydrALAZINE  25 mg Oral TID  . insulin aspart  2-10 Units Subcutaneous TID  . insulin glargine  12 Units Subcutaneous Daily  . levothyroxine  50 mcg Oral QAC breakfast  . magnesium oxide  400 mg  Oral Daily  . multivitamin with minerals  1 tablet Oral Daily  . mupirocin ointment  1 application Nasal BID  . nystatin   Topical BID  . saccharomyces boulardii  250 mg Oral BID  . sodium chloride flush  3 mL Intravenous Q12H  . traZODone  50 mg Oral QHS   Continuous: . sodium chloride Stopped (02/06/18 0521)   EFU:WTKTCC chloride, acetaminophen, guaiFENesin-dextromethorphan, sodium chloride flush  Assesment: Mid with healthcare  associated pneumonia.  She is complaining of abdominal pain.  She has multiple tests pending.  She has dementia at baseline which gets worse when she gets sick.  She is sleepy but able to answer questions  Her pneumonia is better  She has diabetes on insulin stable  She has deconditioning and she has had physical therapy evaluation and it is felt that she is going to require rehabilitation Principal Problem:   HCAP (healthcare-associated pneumonia) Active Problems:   Coronary artery disease   History of CVA in adulthood   Pneumonia    Plan: Check laboratory work and flatplate of the abdomen.    LOS: 6 days   Axle Parfait L 02/10/2018, 12:10 PM

## 2018-02-11 ENCOUNTER — Inpatient Hospital Stay (HOSPITAL_COMMUNITY): Payer: Medicare Other

## 2018-02-11 LAB — GLUCOSE, CAPILLARY
Glucose-Capillary: 109 mg/dL — ABNORMAL HIGH (ref 70–99)
Glucose-Capillary: 87 mg/dL (ref 70–99)
Glucose-Capillary: 87 mg/dL (ref 70–99)
Glucose-Capillary: 91 mg/dL (ref 70–99)

## 2018-02-11 MED ORDER — IOPAMIDOL (ISOVUE-300) INJECTION 61%
30.0000 mL | Freq: Once | INTRAVENOUS | Status: AC | PRN
  Administered 2018-02-11: 30 mL via ORAL

## 2018-02-11 NOTE — Progress Notes (Signed)
Bladder distention noted on CT.  In & out cath with 1253ml output.  Urine is amber colored with sediment.

## 2018-02-11 NOTE — Progress Notes (Signed)
Subjective: She is complaining of abdominal pain again.  She seems a little more distended in the right lower quadrant as well as the left lower quadrant.  Plain film of abdomen was not revealing.  Her creatinine is gone up to 1.6 with GFR of 28 so I think we will have to do a noncontrast CT.  Other laboratory work nonrevealing Objective: Vital signs in last 24 hours: Temp:  [97.8 F (36.6 C)-98.9 F (37.2 C)] 97.8 F (36.6 C) (11/30 0508) Pulse Rate:  [67-71] 67 (11/30 0508) Resp:  [19] 19 (11/30 0508) BP: (152-185)/(59-79) 160/78 (11/30 0508) SpO2:  [87 %-96 %] 87 % (11/30 0508) Weight change:  Last BM Date: 02/10/18  Intake/Output from previous day: 11/29 0701 - 11/30 0700 In: 120 [P.O.:120] Out: -   PHYSICAL EXAM General appearance: alert and mild distress Resp: clear to auscultation bilaterally Cardio: regular rate and rhythm, S1, S2 normal, no murmur, click, rub or gallop GI: She seems more distended. Extremities: extremities normal, atraumatic, no cyanosis or edema  Lab Results:  Results for orders placed or performed during the hospital encounter of 02/03/18 (from the past 48 hour(s))  Glucose, capillary     Status: Abnormal   Collection Time: 02/09/18 11:13 AM  Result Value Ref Range   Glucose-Capillary 141 (H) 70 - 99 mg/dL  Glucose, capillary     Status: Abnormal   Collection Time: 02/09/18  4:42 PM  Result Value Ref Range   Glucose-Capillary 115 (H) 70 - 99 mg/dL  Glucose, capillary     Status: Abnormal   Collection Time: 02/09/18  9:34 PM  Result Value Ref Range   Glucose-Capillary 130 (H) 70 - 99 mg/dL   Comment 1 Document in Chart   Creatinine, serum     Status: Abnormal   Collection Time: 02/10/18  6:31 AM  Result Value Ref Range   Creatinine, Ser 1.50 (H) 0.44 - 1.00 mg/dL   GFR calc non Af Amer 31 (L) >60 mL/min   GFR calc Af Amer 36 (L) >60 mL/min    Comment: Performed at [facility], [address], [place], [place] [address]  Glucose, capillary      Status: Abnormal   Collection Time: 02/10/18  7:33 AM  Result Value Ref Range   Glucose-Capillary 106 (H) 70 - 99 mg/dL  Glucose, capillary     Status: Abnormal   Collection Time: 02/10/18 11:15 AM  Result Value Ref Range   Glucose-Capillary 122 (H) 70 - 99 mg/dL  CBC with Differential/Platelet     Status: None   Collection Time: 02/10/18 12:07 PM  Result Value Ref Range   WBC 9.1 4.0 - 10.5 K/uL   RBC 4.35 3.87 - 5.11 MIL/uL   Hemoglobin 13.0 12.0 - 15.0 g/dL   HCT 42.7 36.0 - 46.0 %   MCV 98.2 80.0 - 100.0 fL   MCH 29.9 26.0 - 34.0 pg   MCHC 30.4 30.0 - 36.0 g/dL   RDW 13.1 11.5 - 15.5 %   Platelets 269 150 - 400 K/uL   nRBC 0.0 0.0 - 0.2 %   Neutrophils Relative % 78 %   Neutro Abs 7.3 1.7 - 7.7 K/uL   Lymphocytes Relative 10 %   Lymphs Abs 0.9 0.7 - 4.0 K/uL   Monocytes Relative 7 %   Monocytes Absolute 0.7 0.1 - 1.0 K/uL   Eosinophils Relative 3 %   Eosinophils Absolute 0.2 0.0 - 0.5 K/uL   Basophils Relative 1 %   Basophils Absolute  0.1 0.0 - 0.1 K/uL   Immature Granulocytes 1 %   Abs Immature Granulocytes 0.07 0.00 - 0.07 K/uL    Comment: Performed at [facility], [address], [place], [place] [address]  Comprehensive metabolic panel     Status: Abnormal   Collection Time: 02/10/18 12:07 PM  Result Value Ref Range   Sodium 143 135 - 145 mmol/L   Potassium 4.4 3.5 - 5.1 mmol/L   Chloride 112 (H) 98 - 111 mmol/L   CO2 22 22 - 32 mmol/L   Glucose, Bld 133 (H) 70 - 99 mg/dL   BUN 38 (H) 8 - 23 mg/dL   Creatinine, Ser 1.60 (H) 0.44 - 1.00 mg/dL   Calcium 9.1 8.9 - 10.3 mg/dL   Total Protein 7.2 6.5 - 8.1 g/dL   Albumin 3.6 3.5 - 5.0 g/dL   AST 17 15 - 41 U/L   ALT 17 0 - 44 U/L   Alkaline Phosphatase 64 38 - 126 U/L   Total Bilirubin 0.8 0.3 - 1.2 mg/dL   GFR calc non Af Amer 28 (L) >60 mL/min   GFR calc Af Amer 33 (L) >60 mL/min   Anion gap 9 5 - 15    Comment: Performed at [facility], [address], [place], [place] [address]  Ammonia     Status:  Abnormal   Collection Time: 02/10/18  2:14 PM  Result Value Ref Range   Ammonia <9 (L) 9 - 35 umol/L    Comment: Performed at [facility], [address], [place], [place] [address]  Glucose, capillary     Status: Abnormal   Collection Time: 02/10/18  4:56 PM  Result Value Ref Range   Glucose-Capillary 113 (H) 70 - 99 mg/dL   Comment 1 Notify RN    Comment 2 Document in Chart   Urinalysis, dipstick only     Status: Abnormal   Collection Time: 02/10/18  5:40 PM  Result Value Ref Range   Color, Urine YELLOW YELLOW   APPearance CLOUDY (A) CLEAR   Specific Gravity, Urine 1.014 1.005 - 1.030   pH 5.0 5.0 - 8.0   Glucose, UA 50 (A) NEGATIVE mg/dL   Hgb urine dipstick NEGATIVE NEGATIVE   Bilirubin Urine NEGATIVE NEGATIVE   Ketones, ur NEGATIVE NEGATIVE mg/dL   Protein, ur >=300 (A) NEGATIVE mg/dL   Nitrite NEGATIVE NEGATIVE   Leukocytes, UA NEGATIVE NEGATIVE    Comment: Performed at [facility], [address], [place], [place] [address]  Glucose, capillary     Status: Abnormal   Collection Time: 02/10/18  9:32 PM  Result Value Ref Range   Glucose-Capillary 103 (H) 70 - 99 mg/dL   Comment 1 Notify RN    Comment 2 Document in Chart   Glucose, capillary     Status: Abnormal   Collection Time: 02/11/18  7:49 AM  Result Value Ref Range   Glucose-Capillary 109 (H) 70 - 99 mg/dL    ABGS No results for input(s): PHART, PO2ART, TCO2, HCO3 in the last 72 hours.  Invalid input(s): PCO2 CULTURES Recent Results (from the past 240 hour(s))  Urine culture     Status: Abnormal   Collection Time: 02/03/18  7:44 PM  Result Value Ref Range Status   Specimen Description   Final    URINE, CATHETERIZED Performed at [facility], [address], [place], [place] [address]    Special Requests   Final    NONE Performed at [facility], [address],  [place], [place] [address]    Culture (A)  Final    <10,000 COLONIES/mL INSIGNIFICANT GROWTH Performed at [facility] [address], [place], [place] [address]    Report Status 02/05/2018 FINAL  Final  Blood Culture (routine x 2)     Status: None   Collection Time: 02/03/18  7:54 PM  Result Value Ref Range Status   Specimen Description BLOOD IV SITE  Final   Special Requests   Final    BOTTLES DRAWN AEROBIC AND ANAEROBIC Blood Culture adequate volume   Culture   Final    NO GROWTH 5 DAYS Performed at [facility], [address], [place], [place] [address]    Report Status 02/08/2018 FINAL  Final  Blood Culture (routine x 2)     Status: None   Collection Time: 02/03/18  8:23 PM  Result Value Ref Range Status   Specimen Description BLOOD RIGHT HAND  Final   Special Requests   Final    BOTTLES DRAWN AEROBIC AND ANAEROBIC Blood Culture adequate volume   Culture   Final    NO GROWTH 5 DAYS Performed at [facility], [address], [place], [place] [address]    Report Status 02/08/2018 FINAL  Final  MRSA PCR Screening     Status: Abnormal   Collection Time: 02/04/18  2:00 AM  Result Value Ref Range Status   MRSA by PCR POSITIVE (A) NEGATIVE Final    Comment:        The GeneXpert MRSA Assay (FDA approved for NASAL specimens only), is one component of a comprehensive MRSA colonization surveillance program. It is not intended to diagnose MRSA infection nor to guide or monitor treatment for MRSA infections. RESULT CALLED TO, READ BACK BY AND VERIFIED WITH: GADDY,C @ 0514 ON 02/04/18 BY JUW Performed at [facility], [address], [place], [place] [address]    Studies/Results: Dg Abd 1 View  Result Date: 02/10/2018 CLINICAL DATA:  Abdominal pain. EXAM: ABDOMEN - 1 VIEW COMPARISON:  Ultrasound 11/16/2016.  05/03/2016. FINDINGS: Surgical clips noted over the abdomen. Soft tissue structures are unremarkable. No bowel distention. No free air. Aortoiliac atherosclerotic vascular calcification. Degenerative change lumbar spine and both hips IMPRESSION: No acute abnormality. Electronically Signed   By: Norwood   On: 02/10/2018 11:44    Medications:  Prior to Admission:  Medications Prior to Admission  Medication Sig Dispense Refill Last Dose  . acetaminophen (TYLENOL) 325 MG tablet Take 2 tablets (650 mg total) by mouth every 6 (six) hours as needed for mild pain or moderate pain. 60 tablet 3 UNKNOWN  . amLODipine (NORVASC) 10 MG tablet Take 10 mg by mouth daily.    02/02/2018 at Unknown time  . amoxicillin (AMOXIL) 500 MG capsule Take 500 mg by mouth 3 (three) times daily. Give 1 capsule by mouth three times a day for UTI for 10 days.   02/02/2018 at Unknown time  . aspirin EC 81 MG tablet Take 81 mg by mouth daily.   02/02/2018 at Unknown time  . atorvastatin (LIPITOR) 20 MG tablet Take 20 mg by mouth daily.    02/02/2018 at Unknown time  . cetirizine (ZYRTEC) 10 MG tablet Take 10 mg by mouth at bedtime as needed for allergies.     . cholecalciferol (VITAMIN D) 1000 units tablet Take 1,000 Units by mouth daily.    02/02/2018 at Unknown time  . clotrimazole-betamethasone (LOTRISONE) cream Apply 1 application topically 2 (two) times daily.  02/02/2018 at Unknown time  . dextromethorphan-guaiFENesin (ROBITUSSIN-DM) 10-100 MG/5ML liquid Take 5 mLs by mouth every 4 (four) hours as needed for cough. Give 5cc by mouth every 4 hours as needed for cough.   not in past 5 days  . dextrose (GLUTOSE 15) 40 % GEL Take 1 Tube by mouth. Give 1 application by mouth as needed for blood sugar less than 60 recheck blood sugar in 15 minutes; notify MD if blood sugar doesn't increase.   not in past 5 days  . famotidine (PEPCID) 20 MG tablet Take 20 mg by mouth daily.    02/02/2018 at Unknown time  . fluticasone (FLONASE) 50 MCG/ACT nasal spray Place 2 sprays into both nostrils daily.   02/02/2018 at Unknown time  . hydrALAZINE (APRESOLINE) 25 MG tablet Take 25 mg by mouth 3 (three) times daily. Give 1 tablet by mouth three times a day for HTN hold for systolic BP <161   09/60/4540 at Unknown time  . insulin  glargine (LANTUS) 100 UNIT/ML injection Inject 0.12 mLs (12 Units total) into the skin daily. (Patient taking differently: Inject 28 Units into the skin every morning. ) 10 mL 11 02/03/2018 at Unknown time  . levothyroxine (SYNTHROID, LEVOTHROID) 50 MCG tablet Take 50 mcg by mouth daily before breakfast.    02/02/2018 at Unknown time  . magnesium oxide (MAG-OX) 400 MG tablet Take 400 mg by mouth daily.    02/02/2018 at Unknown time  . Multiple Vitamin (MULTIVITAMIN WITH MINERALS) TABS tablet Take 1 tablet by mouth daily.    02/02/2018 at Unknown time  . naproxen sodium (ALEVE) 220 MG tablet Take 220 mg by mouth every 12 (twelve) hours as needed.   not in past 5 days  . traZODone (DESYREL) 50 MG tablet Take 50 mg by mouth at bedtime.   02/02/2018 at Unknown time  . trimethoprim (TRIMPEX) 100 MG tablet Take 100 mg by mouth daily. Give 1 tablet by mouth one time a day for preventative.   02/03/2018 at Unknown time  . Wheat Dextrin (BENEFIBER PO) Take 10 mLs by mouth daily.    02/02/2018 at Unknown time  . insulin aspart (NOVOLOG FLEXPEN) 100 UNIT/ML FlexPen Inject 2-10 Units into the skin 3 (three) times daily. Give per sliding scale three times a day:  201-250= 2 UNITS 251-300= 4 UNITS 301-350= 6 UNITS 351-400= 8 UNITS 401-450=10UNITS   Not Taking at Unknown time   Scheduled: . amLODipine  10 mg Oral Daily  . aspirin EC  81 mg Oral Daily  . atorvastatin  20 mg Oral Daily  . cholecalciferol  1,000 Units Oral Daily  . famotidine  20 mg Oral Daily  . fluticasone  2 spray Each Nare Daily  . hydrALAZINE  25 mg Oral TID  . insulin aspart  2-10 Units Subcutaneous TID  . insulin glargine  12 Units Subcutaneous Daily  . levothyroxine  50 mcg Oral QAC breakfast  . magnesium oxide  400 mg Oral Daily  . multivitamin with minerals  1 tablet Oral Daily  . nystatin   Topical BID  . saccharomyces boulardii  250 mg Oral BID  . sodium chloride flush  3 mL Intravenous Q12H  . traZODone  50 mg Oral QHS    Continuous: . sodium chloride Stopped (02/06/18 0521)   JWJ:[id number] chloride, acetaminophen, guaiFENesin-dextromethorphan, sodium chloride flush  Assesment: She was admitted with pneumonia.  She seems to be improved from that.  She is had significant change in mental status is known to  have dementia at baseline and seems to get more problems with her mental status when she gets medically sick and I think that is why she is having trouble now  She has abdominal pain and is not really clear what that is from.  Laboratory work was nonrevealing.  She has acute on chronic renal failure now stage IV   Principal Problem:   HCAP (healthcare-associated pneumonia) Active Problems:   Coronary artery disease   History of CVA in adulthood   Pneumonia    Plan: Noncontrast CT of the abdomen will do oral contrast if she can tolerate it.  Continue other treatments.  Discontinue antibiotics now    LOS: 7 days   Shanee Batch L 02/11/2018, 10:09 AM

## 2018-02-12 LAB — URINALYSIS, DIPSTICK ONLY
Bilirubin Urine: NEGATIVE
Glucose, UA: NEGATIVE mg/dL
HGB URINE DIPSTICK: NEGATIVE
Ketones, ur: 5 mg/dL — AB
Leukocytes, UA: NEGATIVE
Nitrite: NEGATIVE
Protein, ur: 100 mg/dL — AB
Specific Gravity, Urine: 1.014 (ref 1.005–1.030)
pH: 5 (ref 5.0–8.0)

## 2018-02-12 LAB — GLUCOSE, CAPILLARY
GLUCOSE-CAPILLARY: 68 mg/dL — AB (ref 70–99)
GLUCOSE-CAPILLARY: 117 mg/dL — AB (ref 70–99)
Glucose-Capillary: 71 mg/dL (ref 70–99)
Glucose-Capillary: 142 mg/dL — ABNORMAL HIGH (ref 70–99)
Glucose-Capillary: 134 mg/dL — ABNORMAL HIGH (ref 70–99)
Glucose-Capillary: 118 mg/dL — ABNORMAL HIGH (ref 70–99)

## 2018-02-12 LAB — URINE CULTURE: Culture: NO GROWTH

## 2018-02-12 MED ORDER — SODIUM CHLORIDE 0.9 % IV SOLN
250.0000 mL | INTRAVENOUS | Status: DC | PRN
  Administered 2018-02-12 – 2018-02-13 (×2): 250 mL via INTRAVENOUS
  Administered 2018-02-14: 13:00:00 via INTRAVENOUS

## 2018-02-12 NOTE — Progress Notes (Signed)
I&O cath performed with 750 cc of dark yellow urine returned.

## 2018-02-12 NOTE — Progress Notes (Signed)
Bladder scan performed showing greater than 500cc's in the bladder. MD paged.

## 2018-02-12 NOTE — Progress Notes (Signed)
Subjective: She has developed more problem with acute urinary retention.  She is had in and out cath twice and I think it is reasonable just to put a Foley in now.  This may be giving her more trouble with her mental status.  Objective: Vital signs in last 24 hours: Temp:  [97.5 F (36.4 C)-98 F (36.7 C)] 97.5 F (36.4 C) (12/01 0515) Pulse Rate:  [62-73] 73 (12/01 0515) Resp:  [18] 18 (12/01 0515) BP: (139-143)/(53-74) 143/74 (12/01 0515) SpO2:  [97 %-99 %] 99 % (12/01 0515) Weight change:  Last BM Date: 02/12/18  Intake/Output from previous day: 11/30 0701 - 12/01 0700 In: 0  Out: 2550 [Urine:2550]  PHYSICAL EXAM General appearance: Sleepy but arousable Resp: rhonchi bilaterally Cardio: regular rate and rhythm, S1, S2 normal, no murmur, click, rub or gallop GI: soft, non-tender; bowel sounds normal; no masses,  no organomegaly Extremities: extremities normal, atraumatic, no cyanosis or edema  Lab Results:  Results for orders placed or performed during the hospital encounter of 02/03/18 (from the past 48 hour(s))  Glucose, capillary     Status: Abnormal   Collection Time: 02/10/18 11:15 AM  Result Value Ref Range   Glucose-Capillary 122 (H) 70 - 99 mg/dL  CBC with Differential/Platelet     Status: None   Collection Time: 02/10/18 12:07 PM  Result Value Ref Range   WBC 9.1 4.0 - 10.5 K/uL   RBC 4.35 3.87 - 5.11 MIL/uL   Hemoglobin 13.0 12.0 - 15.0 g/dL   HCT 42.7 36.0 - 46.0 %   MCV 98.2 80.0 - 100.0 fL   MCH 29.9 26.0 - 34.0 pg   MCHC 30.4 30.0 - 36.0 g/dL   RDW 13.1 11.5 - 15.5 %   Platelets 269 150 - 400 K/uL   nRBC 0.0 0.0 - 0.2 %   Neutrophils Relative % 78 %   Neutro Abs 7.3 1.7 - 7.7 K/uL   Lymphocytes Relative 10 %   Lymphs Abs 0.9 0.7 - 4.0 K/uL   Monocytes Relative 7 %   Monocytes Absolute 0.7 0.1 - 1.0 K/uL   Eosinophils Relative 3 %   Eosinophils Absolute 0.2 0.0 - 0.5 K/uL   Basophils Relative 1 %   Basophils Absolute 0.1 0.0 - 0.1 K/uL    Immature Granulocytes 1 %   Abs Immature Granulocytes 0.07 0.00 - 0.07 K/uL    Comment: Performed at [facility], [address] [place], [place] [address]  Comprehensive metabolic panel     Status: Abnormal   Collection Time: 02/10/18 12:07 PM  Result Value Ref Range   Sodium 143 135 - 145 mmol/L   Potassium 4.4 3.5 - 5.1 mmol/L   Chloride 112 (H) 98 - 111 mmol/L   CO2 22 22 - 32 mmol/L   Glucose, Bld 133 (H) 70 - 99 mg/dL   BUN 38 (H) 8 - 23 mg/dL   Creatinine, Ser 1.60 (H) 0.44 - 1.00 mg/dL   Calcium 9.1 8.9 - 10.3 mg/dL   Total Protein 7.2 6.5 - 8.1 g/dL   Albumin 3.6 3.5 - 5.0 g/dL   AST 17 15 - 41 U/L   ALT 17 0 - 44 U/L   Alkaline Phosphatase 64 38 - 126 U/L   Total Bilirubin 0.8 0.3 - 1.2 mg/dL   GFR calc non Af Amer 28 (L) >60 mL/min   GFR calc Af Amer 33 (L) >60 mL/min   Anion gap 9 5 - 15    Comment: Performed at  [facility], [address] [place], [place] [address]  Ammonia     Status: Abnormal   Collection Time: 02/10/18  2:14 PM  Result Value Ref Range   Ammonia <9 (L) 9 - 35 umol/L    Comment: Performed at [facility], [address], [place], [place] [address]  Glucose, capillary     Status: Abnormal   Collection Time: 02/10/18  4:56 PM  Result Value Ref Range   Glucose-Capillary 113 (H) 70 - 99 mg/dL   Comment 1 Notify RN    Comment 2 Document in Chart   Urinalysis, dipstick only     Status: Abnormal   Collection Time: 02/10/18  5:40 PM  Result Value Ref Range   Color, Urine YELLOW YELLOW   APPearance CLOUDY (A) CLEAR   Specific Gravity, Urine 1.014 1.005 - 1.030   pH 5.0 5.0 - 8.0   Glucose, UA 50 (A) NEGATIVE mg/dL   Hgb urine dipstick NEGATIVE NEGATIVE   Bilirubin Urine NEGATIVE NEGATIVE   Ketones, ur NEGATIVE NEGATIVE mg/dL   Protein, ur >=300 (A) NEGATIVE mg/dL   Nitrite NEGATIVE NEGATIVE   Leukocytes, UA NEGATIVE NEGATIVE    Comment: Performed at [facility], [address], [place], [place] [address]  Urine Culture     Status: None    Collection Time: 02/10/18  5:40 PM  Result Value Ref Range   Specimen Description      URINE, CLEAN CATCH Performed at [facility], [address], [place], [place] [address]    Special Requests      NONE Performed at [facility], [address] [place], [place] [address]    Culture      NO GROWTH Performed at [facility], [place] [address] [place], [place] [address]    Report Status 02/12/2018 FINAL   Glucose, capillary     Status: Abnormal   Collection Time: 02/10/18  9:32 PM  Result Value Ref Range   Glucose-Capillary 103 (H) 70 - 99 mg/dL   Comment 1 Notify RN    Comment 2 Document in Chart   Glucose, capillary     Status: Abnormal   Collection Time: 02/11/18  7:49 AM  Result Value Ref Range   Glucose-Capillary 109 (H) 70 - 99 mg/dL  Glucose, capillary     Status: None   Collection Time: 02/11/18 11:35 AM  Result Value Ref Range   Glucose-Capillary 87 70 - 99 mg/dL   Comment 1 Notify RN    Comment 2 Document in Chart   Glucose, capillary     Status: None   Collection Time: 02/11/18  4:02 PM  Result Value Ref Range   Glucose-Capillary 87 70 - 99 mg/dL  Glucose, capillary     Status: None   Collection Time: 02/11/18 10:23 PM  Result Value Ref Range   Glucose-Capillary 91 70 - 99 mg/dL  Glucose, capillary     Status: Abnormal   Collection Time: 02/12/18  7:23 AM  Result Value Ref Range   Glucose-Capillary 68 (L) 70 - 99 mg/dL  Glucose, capillary     Status: None   Collection Time: 02/12/18  7:41 AM  Result Value Ref Range   Glucose-Capillary 71 70 - 99 mg/dL  Glucose, capillary     Status: Abnormal   Collection Time: 02/12/18  8:56 AM  Result Value Ref Range   Glucose-Capillary 142 (H) 70 - 99 mg/dL   Comment 1 Notify RN     ABGS No results for input(s):  PHART, PO2ART, TCO2, HCO3 in the last 72 hours.  Invalid input(s): PCO2 CULTURES Recent Results (from the past 240 hour(s))  Urine culture     Status: Abnormal   Collection Time: 02/03/18  7:44 PM   Result Value Ref Range Status   Specimen Description   Final    URINE, CATHETERIZED Performed at [facility], [address], [place], [place] [address]    Special Requests   Final    NONE Performed at [facility], [address], [place], [place] [address]    Culture (A)  Final    <10,000 COLONIES/mL INSIGNIFICANT GROWTH Performed at [place] [address], [place], [place] [address]    Report Status 02/05/2018 FINAL  Final  Blood Culture (routine x 2)     Status: None   Collection Time: 02/03/18  7:54 PM  Result Value Ref Range Status   Specimen Description BLOOD IV SITE  Final   Special Requests   Final    BOTTLES DRAWN AEROBIC AND ANAEROBIC Blood Culture adequate volume   Culture   Final    NO GROWTH 5 DAYS Performed at [facility], [address] [place], [place] [address]    Report Status 02/08/2018 FINAL  Final  Blood Culture (routine x 2)     Status: None   Collection Time: 02/03/18  8:23 PM  Result Value Ref Range Status   Specimen Description BLOOD RIGHT HAND  Final   Special Requests   Final    BOTTLES DRAWN AEROBIC AND ANAEROBIC Blood Culture adequate volume   Culture   Final    NO GROWTH 5 DAYS Performed at [facility], [address], [place], [place] [address]    Report Status 02/08/2018 FINAL  Final  MRSA PCR Screening     Status: Abnormal   Collection Time: 02/04/18  2:00 AM  Result Value Ref Range Status   MRSA by PCR POSITIVE (A) NEGATIVE Final    Comment:        The GeneXpert MRSA Assay (FDA approved for NASAL specimens only), is one component of a comprehensive MRSA colonization surveillance program. It is not intended to diagnose MRSA infection nor to guide or monitor treatment for MRSA infections. RESULT CALLED TO, READ BACK BY AND VERIFIED WITH: GADDY,C @ 0514 ON 02/04/18 BY JUW Performed at [facility] [address] [place], [place] [address]   Urine Culture     Status: None   Collection Time: 02/10/18  5:40 PM  Result  Value Ref Range Status   Specimen Description   Final    URINE, CLEAN CATCH Performed at [facility], [address], [place], [place] [address]    Special Requests   Final    NONE Performed at [facility], [address], [place], [place] [address]    Culture   Final    NO GROWTH Performed at [facility], [place] [address] [place], [place] [address]    Report Status 02/12/2018 FINAL  Final   Studies/Results: Ct Abdomen Pelvis Wo Contrast  Result Date: 02/11/2018 CLINICAL DATA:  Abdominal pain. EXAM: CT ABDOMEN AND PELVIS WITHOUT CONTRAST TECHNIQUE: Multidetector CT imaging of the abdomen and pelvis was performed following the standard protocol without IV contrast. COMPARISON:  None FINDINGS: Lower chest: No acute abnormality. Hepatobiliary: No focal liver abnormality is seen. No gallstones, gallbladder wall thickening, or biliary dilatation. Pancreas: Unremarkable. No pancreatic ductal dilatation or surrounding inflammatory changes. Spleen: Normal in size without focal abnormality. Adrenals/Urinary Tract:  Small calcifications are identified within both adrenal glands. No discrete mass identified. Bilateral kidney cysts are noted which are incompletely characterized without IV contrast material. Dominant cyst arises from the medial cortex of the inferior pole of right kidney measuring 5.2 cm. Hyperdense lesion arising from the upper pole of the left kidney measures 2.1 cm and 35 HU. This may represent a hemorrhagic cyst or solid kidney lesion. Bilateral pelvocaliectasis is identified along with hydroureter. The urinary bladder is markedly distended measuring 12.5 by 12.8 by 15.1 cm (volume = 1270 cm^3). Stomach/Bowel: Stomach appears nondistended. There is no small or large bowel dilatation. Within the limitations of unenhanced technique there is no bowel wall thickening or inflammation noted. There is a large ventral abdominal wall hernia measuring 12.6 cm and contains nonobstructed loops of  large and small bowel. The appendix is visualized and appears normal. Vascular/Lymphatic: Aortic atherosclerosis. No aneurysm. No enlarged abdominal or pelvic lymph nodes. Reproductive: Uterus and bilateral adnexa are unremarkable. Other: No free fluid or fluid collections within the abdomen or pelvis. Musculoskeletal: No acute or significant osseous findings. IMPRESSION: 1. Marked distension of the urinary bladder with bilateral pelvocaliectasis and hydroureter. The bladder as a volume of 1270 mL. 2. Large ventral abdominal wall hernia contains nonobstructed loops of small and large bowel. 3. Bilateral kidney lesions of varying density are incompletely characterized without IV contrast material. This includes a hyperdense lesion arising from the upper pole of left kidney which may represent a solid kidney lesion or hemorrhagic cysts. 4.  Aortic Atherosclerosis (ICD10-I70.0). Electronically Signed   By: Kerby Moors M.D.   On: 02/11/2018 14:35   Dg Abd 1 View  Result Date: 02/10/2018 CLINICAL DATA:  Abdominal pain. EXAM: ABDOMEN - 1 VIEW COMPARISON:  Ultrasound 11/16/2016.  05/03/2016. FINDINGS: Surgical clips noted over the abdomen. Soft tissue structures are unremarkable. No bowel distention. No free air. Aortoiliac atherosclerotic vascular calcification. Degenerative change lumbar spine and both hips IMPRESSION: No acute abnormality. Electronically Signed   By: [place]   On: 02/10/2018 11:44    Medications:  Prior to Admission:  Medications Prior to Admission  Medication Sig Dispense Refill Last Dose  . acetaminophen (TYLENOL) 325 MG tablet Take 2 tablets (650 mg total) by mouth every 6 (six) hours as needed for mild pain or moderate pain. 60 tablet 3 UNKNOWN  . amLODipine (NORVASC) 10 MG tablet Take 10 mg by mouth daily.    02/02/2018 at Unknown time  . amoxicillin (AMOXIL) 500 MG capsule Take 500 mg by mouth 3 (three) times daily. Give 1 capsule by mouth three times a day for UTI for 10  days.   02/02/2018 at Unknown time  . aspirin EC 81 MG tablet Take 81 mg by mouth daily.   02/02/2018 at Unknown time  . atorvastatin (LIPITOR) 20 MG tablet Take 20 mg by mouth daily.    02/02/2018 at Unknown time  . cetirizine (ZYRTEC) 10 MG tablet Take 10 mg by mouth at bedtime as needed for allergies.     . cholecalciferol (VITAMIN D) 1000 units tablet Take 1,000 Units by mouth daily.    02/02/2018 at Unknown time  . clotrimazole-betamethasone (LOTRISONE) cream Apply 1 application topically 2 (two) times daily.   02/02/2018 at Unknown time  . dextromethorphan-guaiFENesin (ROBITUSSIN-DM) 10-100 MG/5ML liquid Take 5 mLs by mouth every 4 (four) hours as needed for cough. Give 5cc by mouth every 4 hours as needed for cough.   not in past 5 days  . dextrose (GLUTOSE 15)  40 % GEL Take 1 Tube by mouth. Give 1 application by mouth as needed for blood sugar less than 60 recheck blood sugar in 15 minutes; notify MD if blood sugar doesn't increase.   not in past 5 days  . famotidine (PEPCID) 20 MG tablet Take 20 mg by mouth daily.    02/02/2018 at Unknown time  . fluticasone (FLONASE) 50 MCG/ACT nasal spray Place 2 sprays into both nostrils daily.   02/02/2018 at Unknown time  . hydrALAZINE (APRESOLINE) 25 MG tablet Take 25 mg by mouth 3 (three) times daily. Give 1 tablet by mouth three times a day for HTN hold for systolic BP <161   09/60/4540 at Unknown time  . insulin glargine (LANTUS) 100 UNIT/ML injection Inject 0.12 mLs (12 Units total) into the skin daily. (Patient taking differently: Inject 28 Units into the skin every morning. ) 10 mL 11 02/03/2018 at Unknown time  . levothyroxine (SYNTHROID, LEVOTHROID) 50 MCG tablet Take 50 mcg by mouth daily before breakfast.    02/02/2018 at Unknown time  . magnesium oxide (MAG-OX) 400 MG tablet Take 400 mg by mouth daily.    02/02/2018 at Unknown time  . Multiple Vitamin (MULTIVITAMIN WITH MINERALS) TABS tablet Take 1 tablet by mouth daily.    02/02/2018 at  Unknown time  . naproxen sodium (ALEVE) 220 MG tablet Take 220 mg by mouth every 12 (twelve) hours as needed.   not in past 5 days  . traZODone (DESYREL) 50 MG tablet Take 50 mg by mouth at bedtime.   02/02/2018 at Unknown time  . trimethoprim (TRIMPEX) 100 MG tablet Take 100 mg by mouth daily. Give 1 tablet by mouth one time a day for preventative.   02/03/2018 at Unknown time  . [place] Dextrin (BENEFIBER PO) Take 10 mLs by mouth daily.    02/02/2018 at Unknown time  . insulin aspart (NOVOLOG FLEXPEN) 100 UNIT/ML FlexPen Inject 2-10 Units into the skin 3 (three) times daily. Give per sliding scale three times a day:  201-250= 2 UNITS 251-300= 4 UNITS 301-350= 6 UNITS 351-400= 8 UNITS 401-450=10UNITS   Not Taking at Unknown time   Scheduled: . amLODipine  10 mg Oral Daily  . aspirin EC  81 mg Oral Daily  . atorvastatin  20 mg Oral Daily  . cholecalciferol  1,000 Units Oral Daily  . famotidine  20 mg Oral Daily  . fluticasone  2 spray Each Nare Daily  . hydrALAZINE  25 mg Oral TID  . insulin aspart  2-10 Units Subcutaneous TID  . insulin glargine  12 Units Subcutaneous Daily  . levothyroxine  50 mcg Oral QAC breakfast  . magnesium oxide  400 mg Oral Daily  . multivitamin with minerals  1 tablet Oral Daily  . nystatin   Topical BID  . saccharomyces boulardii  250 mg Oral BID  . sodium chloride flush  3 mL Intravenous Q12H  . traZODone  50 mg Oral QHS   Continuous: . sodium chloride Stopped (02/06/18 0521)   JWJ:[id number] chloride, acetaminophen, guaiFENesin-dextromethorphan, sodium chloride flush  Assesment: She was admitted with pneumonia.  She is better as far as that is concerned.  She has dementia at baseline and she tends to get more confused when she has medical illness.  She has acute urinary retention and I am going to have her get a Foley catheter.  She is had multiple urinary tract infections so we will check urinalysis and culture as well. Principal Problem:   HCAP  (  healthcare-associated pneumonia) Active Problems:   Coronary artery disease   History of CVA in adulthood   Pneumonia    Plan: As above she is not eating well so she will need more fluids    LOS: 8 days   Kentrel Clevenger L 02/12/2018, 10:31 AM

## 2018-02-13 LAB — CBC WITH DIFFERENTIAL/PLATELET
ABS IMMATURE GRANULOCYTES: 0.07 10*3/uL (ref 0.00–0.07)
Basophils Absolute: 0.1 10*3/uL (ref 0.0–0.1)
Basophils Relative: 1 %
Eosinophils Absolute: 0.3 10*3/uL (ref 0.0–0.5)
Eosinophils Relative: 4 %
HCT: 39.7 % (ref 36.0–46.0)
Hemoglobin: 12.1 g/dL (ref 12.0–15.0)
IMMATURE GRANULOCYTES: 1 %
Lymphocytes Relative: 19 %
Lymphs Abs: 1.5 10*3/uL (ref 0.7–4.0)
MCH: 30.2 pg (ref 26.0–34.0)
MCHC: 30.5 g/dL (ref 30.0–36.0)
MCV: 99 fL (ref 80.0–100.0)
MONOS PCT: 12 %
Monocytes Absolute: 0.9 10*3/uL (ref 0.1–1.0)
NEUTROS PCT: 63 %
Neutro Abs: 5.1 10*3/uL (ref 1.7–7.7)
Platelets: 253 10*3/uL (ref 150–400)
RBC: 4.01 MIL/uL (ref 3.87–5.11)
RDW: 13.2 % (ref 11.5–15.5)
WBC: 8.1 10*3/uL (ref 4.0–10.5)
nRBC: 0 % (ref 0.0–0.2)

## 2018-02-13 LAB — URINE CULTURE: Culture: NO GROWTH

## 2018-02-13 LAB — BASIC METABOLIC PANEL
ANION GAP: 6 (ref 5–15)
BUN: 39 mg/dL — AB (ref 8–23)
CO2: 24 mmol/L (ref 22–32)
Calcium: 8.3 mg/dL — ABNORMAL LOW (ref 8.9–10.3)
Chloride: 113 mmol/L — ABNORMAL HIGH (ref 98–111)
Creatinine, Ser: 1.52 mg/dL — ABNORMAL HIGH (ref 0.44–1.00)
GFR calc Af Amer: 35 mL/min — ABNORMAL LOW (ref >60)
GFR calc non Af Amer: 30 mL/min — ABNORMAL LOW (ref >60)
GLUCOSE: 96 mg/dL (ref 70–99)
Potassium: 4.5 mmol/L (ref 3.5–5.1)
Sodium: 143 mmol/L (ref 135–145)

## 2018-02-13 LAB — GLUCOSE, CAPILLARY
GLUCOSE-CAPILLARY: 79 mg/dL (ref 70–99)
Glucose-Capillary: 180 mg/dL — ABNORMAL HIGH (ref 70–99)
Glucose-Capillary: 140 mg/dL — ABNORMAL HIGH (ref 70–99)
Glucose-Capillary: 149 mg/dL — ABNORMAL HIGH (ref 70–99)

## 2018-02-13 NOTE — Clinical Social Work Placement (Signed)
   CLINICAL SOCIAL WORK PLACEMENT  NOTE  Date:  02/13/2018  Patient Details  Name: Regina Bush MRN: [id number] Date of Birth: 06-04-1927  Clinical Social Work is seeking post-discharge placement for this patient at the [place] level of care (*CSW will initial, date and re-position this form in  chart as items are completed):  Yes   Patient/family provided with [facility] Work Department's list of facilities offering this level of care within the geographic area requested by the patient (or if unable, by the patient's family).  Yes   Patient/family informed of their freedom to choose among providers that offer the needed level of care, that participate in Medicare, Medicaid or managed care program needed by the patient, have an available bed and are willing to accept the patient.  Yes   Patient/family informed of [facility]'s ownership interest in [facility] and [facility], as well as of the fact that they are under no obligation to receive care at these facilities.  PASRR submitted to EDS on       PASRR number received on       Existing PASRR number confirmed on 02/08/18     FL2 transmitted to all facilities in geographic area requested by pt/family on 02/08/18     FL2 transmitted to all facilities within larger geographic area on 02/13/18     Patient informed that his/her managed care company has contracts with or will negotiate with certain facilities, including the following:        Yes   Patient/family informed of bed offers received.  Patient chooses bed at [facility]     Physician recommends and patient chooses bed at      Patient to be transferred to [facility] on  .  Patient to be transferred to facility by       Patient family notified on   of transfer.  Name of family member notified:        PHYSICIAN       Additional Comment:    _______________________________________________ Ihor Gully, LCSW 02/13/2018, 1:17 PM

## 2018-02-13 NOTE — Progress Notes (Signed)
PT Cancellation Note  Patient Details Name: Regina Bush MRN: [id number] DOB: 09-20-1927   Cancelled Treatment:    Reason Eval/Treat Not Completed: Patient declined, no reason specified PT session attempted.  Pt refused treatment.  Pt encouraged to complete therapy and educated benefits of completing therapy to assist with mobility and strength.  Pt refused multiple times.  Pt left in chair with call bell within reach and bed alarm set.  [address] LPTA; [place]  Aldona Lento 02/13/2018, 9:34 AM

## 2018-02-13 NOTE — Progress Notes (Signed)
Subjective: She had Foley catheter placed yesterday.  Renal function has improved.  She is still sleepy.  Objective: Vital signs in last 24 hours: Temp:  [98.3 F (36.8 C)-99 F (37.2 C)] 98.3 F (36.8 C) (12/02 0653) Pulse Rate:  [60-69] 64 (12/02 0653) Resp:  [16-18] 16 (12/02 0653) BP: (158-167)/(55-84) 159/84 (12/02 0653) SpO2:  [94 %-98 %] 98 % (12/02 0653) Weight change:  Last BM Date: 02/12/18  Intake/Output from previous day: 12/01 0701 - 12/02 0700 In: 1157.8 [P.O.:480; I.V.:457.8] Out: 250 [Urine:250]  PHYSICAL EXAM General appearance: Arousable but sleepy Resp: rhonchi bilaterally Cardio: regular rate and rhythm, S1, S2 normal, no murmur, click, rub or gallop GI: soft, non-tender; bowel sounds normal; no masses,  no organomegaly Extremities: extremities normal, atraumatic, no cyanosis or edema  Lab Results:  Results for orders placed or performed during the hospital encounter of 02/03/18 (from the past 48 hour(s))  Glucose, capillary     Status: None   Collection Time: 02/11/18 11:35 AM  Result Value Ref Range   Glucose-Capillary 87 70 - 99 mg/dL   Comment 1 Notify RN    Comment 2 Document in Chart   Glucose, capillary     Status: None   Collection Time: 02/11/18  4:02 PM  Result Value Ref Range   Glucose-Capillary 87 70 - 99 mg/dL  Glucose, capillary     Status: None   Collection Time: 02/11/18 10:23 PM  Result Value Ref Range   Glucose-Capillary 91 70 - 99 mg/dL  Glucose, capillary     Status: Abnormal   Collection Time: 02/12/18  7:23 AM  Result Value Ref Range   Glucose-Capillary 68 (L) 70 - 99 mg/dL  Glucose, capillary     Status: None   Collection Time: 02/12/18  7:41 AM  Result Value Ref Range   Glucose-Capillary 71 70 - 99 mg/dL  Glucose, capillary     Status: Abnormal   Collection Time: 02/12/18  8:56 AM  Result Value Ref Range   Glucose-Capillary 142 (H) 70 - 99 mg/dL   Comment 1 Notify RN   Urinalysis, dipstick only     Status: Abnormal    Collection Time: 02/12/18 10:35 AM  Result Value Ref Range   Color, Urine YELLOW YELLOW   APPearance CLOUDY (A) CLEAR   Specific Gravity, Urine 1.014 1.005 - 1.030   pH 5.0 5.0 - 8.0   Glucose, UA NEGATIVE NEGATIVE mg/dL   Hgb urine dipstick NEGATIVE NEGATIVE   Bilirubin Urine NEGATIVE NEGATIVE   Ketones, ur 5 (A) NEGATIVE mg/dL   Protein, ur 100 (A) NEGATIVE mg/dL   Nitrite NEGATIVE NEGATIVE   Leukocytes, UA NEGATIVE NEGATIVE    Comment: Performed at [facility], [address] [place], [place] [address]  Glucose, capillary     Status: Abnormal   Collection Time: 02/12/18 11:12 AM  Result Value Ref Range   Glucose-Capillary 117 (H) 70 - 99 mg/dL  Glucose, capillary     Status: Abnormal   Collection Time: 02/12/18  4:08 PM  Result Value Ref Range   Glucose-Capillary 134 (H) 70 - 99 mg/dL  Glucose, capillary     Status: Abnormal   Collection Time: 02/12/18 10:06 PM  Result Value Ref Range   Glucose-Capillary 118 (H) 70 - 99 mg/dL  CBC with Differential/Platelet     Status: None   Collection Time: 02/13/18  5:05 AM  Result Value Ref Range   WBC 8.1 4.0 - 10.5 K/uL   RBC 4.01 3.87 - 5.11  MIL/uL   Hemoglobin 12.1 12.0 - 15.0 g/dL   HCT 39.7 36.0 - 46.0 %   MCV 99.0 80.0 - 100.0 fL   MCH 30.2 26.0 - 34.0 pg   MCHC 30.5 30.0 - 36.0 g/dL   RDW 13.2 11.5 - 15.5 %   Platelets 253 150 - 400 K/uL   nRBC 0.0 0.0 - 0.2 %   Neutrophils Relative % 63 %   Neutro Abs 5.1 1.7 - 7.7 K/uL   Lymphocytes Relative 19 %   Lymphs Abs 1.5 0.7 - 4.0 K/uL   Monocytes Relative 12 %   Monocytes Absolute 0.9 0.1 - 1.0 K/uL   Eosinophils Relative 4 %   Eosinophils Absolute 0.3 0.0 - 0.5 K/uL   Basophils Relative 1 %   Basophils Absolute 0.1 0.0 - 0.1 K/uL   Immature Granulocytes 1 %   Abs Immature Granulocytes 0.07 0.00 - 0.07 K/uL    Comment: Performed at [facility], [address], [place], [place] [address]  Basic metabolic panel     Status: Abnormal   Collection Time: 02/13/18  5:05 AM   Result Value Ref Range   Sodium 143 135 - 145 mmol/L   Potassium 4.5 3.5 - 5.1 mmol/L   Chloride 113 (H) 98 - 111 mmol/L   CO2 24 22 - 32 mmol/L   Glucose, Bld 96 70 - 99 mg/dL   BUN 39 (H) 8 - 23 mg/dL   Creatinine, Ser 1.52 (H) 0.44 - 1.00 mg/dL   Calcium 8.3 (L) 8.9 - 10.3 mg/dL   GFR calc non Af Amer 30 (L) >60 mL/min   GFR calc Af Amer 35 (L) >60 mL/min   Anion gap 6 5 - 15    Comment: Performed at [facility], [address] [place], [place] [address]  Glucose, capillary     Status: None   Collection Time: 02/13/18  7:29 AM  Result Value Ref Range   Glucose-Capillary 79 70 - 99 mg/dL    ABGS No results for input(s): PHART, PO2ART, TCO2, HCO3 in the last 72 hours.  Invalid input(s): PCO2 CULTURES Recent Results (from the past 240 hour(s))  Urine culture     Status: Abnormal   Collection Time: 02/03/18  7:44 PM  Result Value Ref Range Status   Specimen Description   Final    URINE, CATHETERIZED Performed at [facility], [address], [place], [place] [address]    Special Requests   Final    NONE Performed at [facility], [address] [place], [place] [address]    Culture (A)  Final    <10,000 COLONIES/mL INSIGNIFICANT GROWTH Performed at [facility] [address], [place], [place] [address]    Report Status 02/05/2018 FINAL  Final  Blood Culture (routine x 2)     Status: None   Collection Time: 02/03/18  7:54 PM  Result Value Ref Range Status   Specimen Description BLOOD IV SITE  Final   Special Requests   Final    BOTTLES DRAWN AEROBIC AND ANAEROBIC Blood Culture adequate volume   Culture   Final    NO GROWTH 5 DAYS Performed at [facility], [address] [place],  [address]    Report Status 02/08/2018 FINAL  Final  Blood Culture (routine x 2)     Status: None   Collection Time: 02/03/18  8:23 PM  Result Value Ref Range Status   Specimen Description BLOOD RIGHT HAND  Final   Special Requests  Final    BOTTLES DRAWN AEROBIC AND  ANAEROBIC Blood Culture adequate volume   Culture   Final    NO GROWTH 5 DAYS Performed at [facility], [address], [place], [place] [address]    Report Status 02/08/2018 FINAL  Final  MRSA PCR Screening     Status: Abnormal   Collection Time: 02/04/18  2:00 AM  Result Value Ref Range Status   MRSA by PCR POSITIVE (A) NEGATIVE Final    Comment:        The GeneXpert MRSA Assay (FDA approved for NASAL specimens only), is one component of a comprehensive MRSA colonization surveillance program. It is not intended to diagnose MRSA infection nor to guide or monitor treatment for MRSA infections. RESULT CALLED TO, READ BACK BY AND VERIFIED WITH: GADDY,C @ 0514 ON 02/04/18 BY JUW Performed at [facility], [address], [place], [place] [address]   Urine Culture     Status: None   Collection Time: 02/10/18  5:40 PM  Result Value Ref Range Status   Specimen Description   Final    URINE, CLEAN CATCH Performed at [facility], [address], [place], [place] [address]    Special Requests   Final    NONE Performed at [facility], [address], [place], [place] [address]    Culture   Final    NO GROWTH Performed at [facility], [place] [address], [place], [place] [address]    Report Status 02/12/2018 FINAL  Final   Studies/Results: Ct Abdomen Pelvis Wo Contrast  Result Date: 02/11/2018 CLINICAL DATA:  Abdominal pain. EXAM: CT ABDOMEN AND PELVIS WITHOUT CONTRAST TECHNIQUE: Multidetector CT imaging of the abdomen and pelvis was performed following the standard protocol without IV contrast. COMPARISON:  None FINDINGS: Lower chest: No acute abnormality. Hepatobiliary: No focal liver abnormality is seen. No gallstones, gallbladder wall thickening, or biliary dilatation. Pancreas: Unremarkable. No pancreatic ductal dilatation or surrounding inflammatory changes. Spleen: Normal in size without focal abnormality. Adrenals/Urinary Tract: Small calcifications are identified within  both adrenal glands. No discrete mass identified. Bilateral kidney cysts are noted which are incompletely characterized without IV contrast material. Dominant cyst arises from the medial cortex of the inferior pole of right kidney measuring 5.2 cm. Hyperdense lesion arising from the upper pole of the left kidney measures 2.1 cm and 35 HU. This may represent a hemorrhagic cyst or solid kidney lesion. Bilateral pelvocaliectasis is identified along with hydroureter. The urinary bladder is markedly distended measuring 12.5 by 12.8 by 15.1 cm (volume = 1270 cm^3). Stomach/Bowel: Stomach appears nondistended. There is no small or large bowel dilatation. Within the limitations of unenhanced technique there is no bowel wall thickening or inflammation noted. There is a large ventral abdominal wall hernia measuring 12.6 cm and contains nonobstructed loops of large and small bowel. The appendix is visualized and appears normal. Vascular/Lymphatic: Aortic atherosclerosis. No aneurysm. No enlarged abdominal or pelvic lymph nodes. Reproductive: Uterus and bilateral adnexa are unremarkable. Other: No free fluid or fluid collections within the abdomen or pelvis. Musculoskeletal: No acute or significant osseous findings. IMPRESSION: 1. Marked distension of the urinary bladder with bilateral pelvocaliectasis and hydroureter. The bladder as a volume of 1270 mL. 2. Large ventral abdominal wall hernia contains nonobstructed loops of small and large bowel. 3. Bilateral kidney lesions of varying density are incompletely characterized without IV contrast material. This includes a hyperdense lesion arising from the upper pole of left kidney which may represent a solid kidney lesion  or hemorrhagic cysts. 4.  Aortic Atherosclerosis (ICD10-I70.0). Electronically Signed   By: Kerby Moors M.D.   On: 02/11/2018 14:35    Medications:  Prior to Admission:  Medications Prior to Admission  Medication Sig Dispense Refill Last Dose  .  acetaminophen (TYLENOL) 325 MG tablet Take 2 tablets (650 mg total) by mouth every 6 (six) hours as needed for mild pain or moderate pain. 60 tablet 3 UNKNOWN  . amLODipine (NORVASC) 10 MG tablet Take 10 mg by mouth daily.    02/02/2018 at Unknown time  . amoxicillin (AMOXIL) 500 MG capsule Take 500 mg by mouth 3 (three) times daily. Give 1 capsule by mouth three times a day for UTI for 10 days.   02/02/2018 at Unknown time  . aspirin EC 81 MG tablet Take 81 mg by mouth daily.   02/02/2018 at Unknown time  . atorvastatin (LIPITOR) 20 MG tablet Take 20 mg by mouth daily.    02/02/2018 at Unknown time  . cetirizine (ZYRTEC) 10 MG tablet Take 10 mg by mouth at bedtime as needed for allergies.     . cholecalciferol (VITAMIN D) 1000 units tablet Take 1,000 Units by mouth daily.    02/02/2018 at Unknown time  . clotrimazole-betamethasone (LOTRISONE) cream Apply 1 application topically 2 (two) times daily.   02/02/2018 at Unknown time  . dextromethorphan-guaiFENesin (ROBITUSSIN-DM) 10-100 MG/5ML liquid Take 5 mLs by mouth every 4 (four) hours as needed for cough. Give 5cc by mouth every 4 hours as needed for cough.   not in past 5 days  . dextrose (GLUTOSE 15) 40 % GEL Take 1 Tube by mouth. Give 1 application by mouth as needed for blood sugar less than 60 recheck blood sugar in 15 minutes; notify MD if blood sugar doesn't increase.   not in past 5 days  . famotidine (PEPCID) 20 MG tablet Take 20 mg by mouth daily.    02/02/2018 at Unknown time  . fluticasone (FLONASE) 50 MCG/ACT nasal spray Place 2 sprays into both nostrils daily.   02/02/2018 at Unknown time  . hydrALAZINE (APRESOLINE) 25 MG tablet Take 25 mg by mouth 3 (three) times daily. Give 1 tablet by mouth three times a day for HTN hold for systolic BP <323   55/73/2202 at Unknown time  . insulin glargine (LANTUS) 100 UNIT/ML injection Inject 0.12 mLs (12 Units total) into the skin daily. (Patient taking differently: Inject 28 Units into the skin  every morning. ) 10 mL 11 02/03/2018 at Unknown time  . levothyroxine (SYNTHROID, LEVOTHROID) 50 MCG tablet Take 50 mcg by mouth daily before breakfast.    02/02/2018 at Unknown time  . magnesium oxide (MAG-OX) 400 MG tablet Take 400 mg by mouth daily.    02/02/2018 at Unknown time  . Multiple Vitamin (MULTIVITAMIN WITH MINERALS) TABS tablet Take 1 tablet by mouth daily.    02/02/2018 at Unknown time  . naproxen sodium (ALEVE) 220 MG tablet Take 220 mg by mouth every 12 (twelve) hours as needed.   not in past 5 days  . traZODone (DESYREL) 50 MG tablet Take 50 mg by mouth at bedtime.   02/02/2018 at Unknown time  . trimethoprim (TRIMPEX) 100 MG tablet Take 100 mg by mouth daily. Give 1 tablet by mouth one time a day for preventative.   02/03/2018 at Unknown time  . Wheat Dextrin (BENEFIBER PO) Take 10 mLs by mouth daily.    02/02/2018 at Unknown time  . insulin aspart (NOVOLOG FLEXPEN) 100 UNIT/ML  FlexPen Inject 2-10 Units into the skin 3 (three) times daily. Give per sliding scale three times a day:  201-250= 2 UNITS 251-300= 4 UNITS 301-350= 6 UNITS 351-400= 8 UNITS 401-450=10UNITS   Not Taking at Unknown time   Scheduled: . amLODipine  10 mg Oral Daily  . aspirin EC  81 mg Oral Daily  . atorvastatin  20 mg Oral Daily  . cholecalciferol  1,000 Units Oral Daily  . famotidine  20 mg Oral Daily  . fluticasone  2 spray Each Nare Daily  . hydrALAZINE  25 mg Oral TID  . insulin aspart  2-10 Units Subcutaneous TID  . insulin glargine  12 Units Subcutaneous Daily  . levothyroxine  50 mcg Oral QAC breakfast  . magnesium oxide  400 mg Oral Daily  . multivitamin with minerals  1 tablet Oral Daily  . nystatin   Topical BID  . saccharomyces boulardii  250 mg Oral BID  . sodium chloride flush  3 mL Intravenous Q12H  . traZODone  50 mg Oral QHS   Continuous: . sodium chloride Stopped (02/12/18 1620)   FMB:[id number] chloride, acetaminophen, guaiFENesin-dextromethorphan, sodium chloride  flush  Assesment: She was admitted with healthcare associated pneumonia.  She had initially improved but since that time has had substantial deterioration.  She is not eating well.  She has history of dementia and she is much more confused.  She is had urinary retention now has a Foley catheter and her renal function is improving.  Culture is pending but urinalysis does not look like she has a urinary tract infection. Principal Problem:   HCAP (healthcare-associated pneumonia) Active Problems:   Coronary artery disease   History of CVA in adulthood   Pneumonia    Plan: Continue current treatments.  I do not think there is a lot to add.    LOS: 9 days   Regina Bush L 02/13/2018, 8:40 AM

## 2018-02-14 LAB — GLUCOSE, CAPILLARY
Glucose-Capillary: 95 mg/dL (ref 70–99)
Glucose-Capillary: 199 mg/dL — ABNORMAL HIGH (ref 70–99)
Glucose-Capillary: 139 mg/dL — ABNORMAL HIGH (ref 70–99)
Glucose-Capillary: 217 mg/dL — ABNORMAL HIGH (ref 70–99)

## 2018-02-14 NOTE — Progress Notes (Signed)
Subjective: She looks substantially better this morning.  She is more alert.  She was able to eat some yesterday.  No other new complaints.  Objective: Vital signs in last 24 hours: Temp:  [97.7 F (36.5 C)-98.3 F (36.8 C)] 97.7 F (36.5 C) (12/03 0600) Pulse Rate:  [59-67] 67 (12/03 0600) Resp:  [20] 20 (12/02 1419) BP: (153-177)/(56-78) 177/70 (12/03 0600) SpO2:  [94 %-99 %] 99 % (12/03 0600) Weight change:  Last BM Date: 02/12/18  Intake/Output from previous day: 12/02 0701 - 12/03 0700 In: 2158.6 [P.O.:960; I.V.:1198.6] Out: 1600 [Urine:1600]  PHYSICAL EXAM General appearance: alert, cooperative and no distress Resp: clear to auscultation bilaterally Cardio: regular rate and rhythm, S1, S2 normal, no murmur, click, rub or gallop GI: soft, non-tender; bowel sounds normal; no masses,  no organomegaly Extremities: extremities normal, atraumatic, no cyanosis or edema  Lab Results:  Results for orders placed or performed during the hospital encounter of 02/03/18 (from the past 48 hour(s))  Urinalysis, dipstick only     Status: Abnormal   Collection Time: 02/12/18 10:35 AM  Result Value Ref Range   Color, Urine YELLOW YELLOW   APPearance CLOUDY (A) CLEAR   Specific Gravity, Urine 1.014 1.005 - 1.030   pH 5.0 5.0 - 8.0   Glucose, UA NEGATIVE NEGATIVE mg/dL   Hgb urine dipstick NEGATIVE NEGATIVE   Bilirubin Urine NEGATIVE NEGATIVE   Ketones, ur 5 (A) NEGATIVE mg/dL   Protein, ur 100 (A) NEGATIVE mg/dL   Nitrite NEGATIVE NEGATIVE   Leukocytes, UA NEGATIVE NEGATIVE    Comment: Performed at [facility], [address], [place], [place] [address]  Urine Culture     Status: None   Collection Time: 02/12/18 10:35 AM  Result Value Ref Range   Specimen Description      URINE, CLEAN CATCH Performed at [facility], [address], [place], [place] [address]    Special Requests      NONE Performed at [facility], [address], [place], [place] [address]    Culture       NO GROWTH Performed at [facility], [place] [address], [place], [place] [address]    Report Status 02/13/2018 FINAL   Glucose, capillary     Status: Abnormal   Collection Time: 02/12/18 11:12 AM  Result Value Ref Range   Glucose-Capillary 117 (H) 70 - 99 mg/dL  Glucose, capillary     Status: Abnormal   Collection Time: 02/12/18  4:08 PM  Result Value Ref Range   Glucose-Capillary 134 (H) 70 - 99 mg/dL  Glucose, capillary     Status: Abnormal   Collection Time: 02/12/18 10:06 PM  Result Value Ref Range   Glucose-Capillary 118 (H) 70 - 99 mg/dL  CBC with Differential/Platelet     Status: None   Collection Time: 02/13/18  5:05 AM  Result Value Ref Range   WBC 8.1 4.0 - 10.5 K/uL   RBC 4.01 3.87 - 5.11 MIL/uL   Hemoglobin 12.1 12.0 - 15.0 g/dL   HCT 39.7 36.0 - 46.0 %   MCV 99.0 80.0 - 100.0 fL   MCH 30.2 26.0 - 34.0 pg   MCHC 30.5 30.0 - 36.0 g/dL   RDW 13.2 11.5 - 15.5 %   Platelets 253 150 - 400 K/uL   nRBC 0.0 0.0 - 0.2 %   Neutrophils Relative % 63 %   Neutro Abs 5.1 1.7 - 7.7 K/uL   Lymphocytes Relative 19 %   Lymphs Abs 1.5  0.7 - 4.0 K/uL   Monocytes Relative 12 %   Monocytes Absolute 0.9 0.1 - 1.0 K/uL   Eosinophils Relative 4 %   Eosinophils Absolute 0.3 0.0 - 0.5 K/uL   Basophils Relative 1 %   Basophils Absolute 0.1 0.0 - 0.1 K/uL   Immature Granulocytes 1 %   Abs Immature Granulocytes 0.07 0.00 - 0.07 K/uL    Comment: Performed at [facility], [address], [place], [place] [address]  Basic metabolic panel     Status: Abnormal   Collection Time: 02/13/18  5:05 AM  Result Value Ref Range   Sodium 143 135 - 145 mmol/L   Potassium 4.5 3.5 - 5.1 mmol/L   Chloride 113 (H) 98 - 111 mmol/L   CO2 24 22 - 32 mmol/L   Glucose, Bld 96 70 - 99 mg/dL   BUN 39 (H) 8 - 23 mg/dL   Creatinine, Ser 1.52 (H) 0.44 - 1.00 mg/dL   Calcium 8.3 (L) 8.9 - 10.3 mg/dL   GFR calc non Af Amer 30 (L) >60 mL/min   GFR calc Af Amer 35 (L) >60 mL/min   Anion gap 6 5 - 15     Comment: Performed at [facility], [address] [place], [place] [address]  Glucose, capillary     Status: None   Collection Time: 02/13/18  7:29 AM  Result Value Ref Range   Glucose-Capillary 79 70 - 99 mg/dL  Glucose, capillary     Status: Abnormal   Collection Time: 02/13/18 11:01 AM  Result Value Ref Range   Glucose-Capillary 180 (H) 70 - 99 mg/dL  Glucose, capillary     Status: Abnormal   Collection Time: 02/13/18  4:14 PM  Result Value Ref Range   Glucose-Capillary 140 (H) 70 - 99 mg/dL  Glucose, capillary     Status: Abnormal   Collection Time: 02/13/18 10:00 PM  Result Value Ref Range   Glucose-Capillary 149 (H) 70 - 99 mg/dL  Glucose, capillary     Status: None   Collection Time: 02/14/18  7:30 AM  Result Value Ref Range   Glucose-Capillary 95 70 - 99 mg/dL    ABGS No results for input(s): PHART, PO2ART, TCO2, HCO3 in the last 72 hours.  Invalid input(s): PCO2 CULTURES Recent Results (from the past 240 hour(s))  Urine Culture     Status: None   Collection Time: 02/10/18  5:40 PM  Result Value Ref Range Status   Specimen Description   Final    URINE, CLEAN CATCH Performed at [facility], [address], [place], [place] [address]    Special Requests   Final    NONE Performed at [facility], [address], [place], [place] [address]    Culture   Final    NO GROWTH Performed at [facility], [place] [address] [place], [place] [address]    Report Status 02/12/2018 FINAL  Final  Urine Culture     Status: None   Collection Time: 02/12/18 10:35 AM  Result Value Ref Range Status   Specimen Description   Final    URINE, CLEAN CATCH Performed at [facility], [address], [place], [place] [address]    Special Requests   Final    NONE Performed at [facility], [address], [place], [place] [address]    Culture   Final    NO GROWTH Performed at [facility], [place] [address] [place], [place] [address]    Report  Status 02/13/2018 FINAL  Final    Studies/Results: No results found.  Medications:  Prior to Admission:  Medications Prior to Admission  Medication Sig Dispense Refill Last Dose  . acetaminophen (TYLENOL) 325 MG tablet Take 2 tablets (650 mg total) by mouth every 6 (six) hours as needed for mild pain or moderate pain. 60 tablet 3 UNKNOWN  . amLODipine (NORVASC) 10 MG tablet Take 10 mg by mouth daily.    02/02/2018 at Unknown time  . amoxicillin (AMOXIL) 500 MG capsule Take 500 mg by mouth 3 (three) times daily. Give 1 capsule by mouth three times a day for UTI for 10 days.   02/02/2018 at Unknown time  . aspirin EC 81 MG tablet Take 81 mg by mouth daily.   02/02/2018 at Unknown time  . atorvastatin (LIPITOR) 20 MG tablet Take 20 mg by mouth daily.    02/02/2018 at Unknown time  . cetirizine (ZYRTEC) 10 MG tablet Take 10 mg by mouth at bedtime as needed for allergies.     . cholecalciferol (VITAMIN D) 1000 units tablet Take 1,000 Units by mouth daily.    02/02/2018 at Unknown time  . clotrimazole-betamethasone (LOTRISONE) cream Apply 1 application topically 2 (two) times daily.   02/02/2018 at Unknown time  . dextromethorphan-guaiFENesin (ROBITUSSIN-DM) 10-100 MG/5ML liquid Take 5 mLs by mouth every 4 (four) hours as needed for cough. Give 5cc by mouth every 4 hours as needed for cough.   not in past 5 days  . dextrose (GLUTOSE 15) 40 % GEL Take 1 Tube by mouth. Give 1 application by mouth as needed for blood sugar less than 60 recheck blood sugar in 15 minutes; notify MD if blood sugar doesn't increase.   not in past 5 days  . famotidine (PEPCID) 20 MG tablet Take 20 mg by mouth daily.    02/02/2018 at Unknown time  . fluticasone (FLONASE) 50 MCG/ACT nasal spray Place 2 sprays into both nostrils daily.   02/02/2018 at Unknown time  . hydrALAZINE (APRESOLINE) 25 MG tablet Take 25 mg by mouth 3 (three) times daily. Give 1 tablet by mouth three times a day for HTN hold for systolic BP <099   83/38/2505 at Unknown time  . insulin  glargine (LANTUS) 100 UNIT/ML injection Inject 0.12 mLs (12 Units total) into the skin daily. (Patient taking differently: Inject 28 Units into the skin every morning. ) 10 mL 11 02/03/2018 at Unknown time  . levothyroxine (SYNTHROID, LEVOTHROID) 50 MCG tablet Take 50 mcg by mouth daily before breakfast.    02/02/2018 at Unknown time  . magnesium oxide (MAG-OX) 400 MG tablet Take 400 mg by mouth daily.    02/02/2018 at Unknown time  . Multiple Vitamin (MULTIVITAMIN WITH MINERALS) TABS tablet Take 1 tablet by mouth daily.    02/02/2018 at Unknown time  . naproxen sodium (ALEVE) 220 MG tablet Take 220 mg by mouth every 12 (twelve) hours as needed.   not in past 5 days  . traZODone (DESYREL) 50 MG tablet Take 50 mg by mouth at bedtime.   02/02/2018 at Unknown time  . trimethoprim (TRIMPEX) 100 MG tablet Take 100 mg by mouth daily. Give 1 tablet by mouth one time a day for preventative.   02/03/2018 at Unknown time  . Wheat Dextrin (BENEFIBER PO) Take 10 mLs by mouth daily.    02/02/2018 at Unknown time  . insulin aspart (NOVOLOG FLEXPEN) 100 UNIT/ML FlexPen Inject 2-10 Units into the skin 3 (three) times daily. Give per  sliding scale three times a day:  201-250= 2 UNITS 251-300= 4 UNITS 301-350= 6 UNITS 351-400= 8 UNITS 401-450=10UNITS   Not Taking at Unknown time   Scheduled: . amLODipine  10 mg Oral Daily  . aspirin EC  81 mg Oral Daily  . atorvastatin  20 mg Oral Daily  . cholecalciferol  1,000 Units Oral Daily  . famotidine  20 mg Oral Daily  . fluticasone  2 spray Each Nare Daily  . hydrALAZINE  25 mg Oral TID  . insulin aspart  2-10 Units Subcutaneous TID  . insulin glargine  12 Units Subcutaneous Daily  . levothyroxine  50 mcg Oral QAC breakfast  . magnesium oxide  400 mg Oral Daily  . multivitamin with minerals  1 tablet Oral Daily  . nystatin   Topical BID  . saccharomyces boulardii  250 mg Oral BID  . sodium chloride flush  3 mL Intravenous Q12H  . traZODone  50 mg Oral QHS    Continuous: . sodium chloride 50 mL/hr at 02/14/18 1655   VZS:MOLMBE chloride, acetaminophen, guaiFENesin-dextromethorphan, sodium chloride flush  Assesment: She was admitted with healthcare associated pneumonia likely from aspiration.  She developed acute urinary retention and has a Foley catheter and now.  She has had multiple urinary tract infections in the past.  She is improving.  She is much more alert now. Principal Problem:   HCAP (healthcare-associated pneumonia) Active Problems:   Coronary artery disease   History of CVA in adulthood   Pneumonia    Plan: Continue treatments.  She is eating again.  Possible transfer to skilled care facility in the next 48 hours    LOS: 10 days   Yides Saidi L 02/14/2018, 9:20 AM

## 2018-02-14 NOTE — Progress Notes (Signed)
Physical Therapy Note  Patient Details  Name: Regina Bush MRN: [id number] Date of Birth: 01/19/1928 Today's Date: 02/14/2018    Pt asleep upon entering.  Able to arouse, however pt refused treatment and appeared confused, yelling her daughter's name "donna".  Pt then fell back asleep and wouldn't respond to therapist's questions  Teena Irani, PTA/CLT [place], Regina Bush 02/14/2018, 10:52 AM

## 2018-02-15 ENCOUNTER — Inpatient Hospital Stay (HOSPITAL_COMMUNITY): Payer: Medicare Other

## 2018-02-15 LAB — BASIC METABOLIC PANEL
Anion gap: 6 (ref 5–15)
BUN: 23 mg/dL (ref 8–23)
CO2: 24 mmol/L (ref 22–32)
Calcium: 8.8 mg/dL — ABNORMAL LOW (ref 8.9–10.3)
Chloride: 113 mmol/L — ABNORMAL HIGH (ref 98–111)
Creatinine, Ser: 1.22 mg/dL — ABNORMAL HIGH (ref 0.44–1.00)
GFR calc Af Amer: 46 mL/min — ABNORMAL LOW (ref >60)
GFR calc non Af Amer: 40 mL/min — ABNORMAL LOW (ref >60)
Glucose, Bld: 98 mg/dL (ref 70–99)
Potassium: 4.6 mmol/L (ref 3.5–5.1)
Sodium: 143 mmol/L (ref 135–145)

## 2018-02-15 LAB — GLUCOSE, CAPILLARY
Glucose-Capillary: 88 mg/dL (ref 70–99)
Glucose-Capillary: 111 mg/dL — ABNORMAL HIGH (ref 70–99)
Glucose-Capillary: 111 mg/dL — ABNORMAL HIGH (ref 70–99)
Glucose-Capillary: 97 mg/dL (ref 70–99)
Glucose-Capillary: 92 mg/dL (ref 70–99)
Glucose-Capillary: 158 mg/dL — ABNORMAL HIGH (ref 70–99)

## 2018-02-15 LAB — CREATININE, SERUM
CREATININE: 1.17 mg/dL — AB (ref 0.44–1.00)
GFR calc non Af Amer: 42 mL/min — ABNORMAL LOW (ref >60)
GFR, EST AFRICAN AMERICAN: 48 mL/min — AB (ref >60)

## 2018-02-15 MED ORDER — ENSURE ENLIVE PO LIQD
237.0000 mL | Freq: Two times a day (BID) | ORAL | Status: DC
  Administered 2018-02-15 – 2018-02-16 (×2): 237 mL via ORAL

## 2018-02-15 NOTE — Care Management Important Message (Signed)
Important Message  Patient Details  Name: Regina Bush MRN: [id number] Date of Birth: 1927-05-05   Medicare Important Message Given:  Yes    Sherald Barge, RN 02/15/2018, 1:27 PM

## 2018-02-15 NOTE — Progress Notes (Signed)
Patient reporting pain associated with her foley catheter. Dr. Olevia Bowens notified. Catheter removed. 200 ml of clear, yellow urine emptied from bag.Patient educated to urinate as soon as she can and call as needed. Bed alarm is on and call bell within reach.

## 2018-02-15 NOTE — Progress Notes (Signed)
Initial Nutrition Assessment  DOCUMENTATION CODES:  Obesity unspecified  INTERVENTION:  Given poor intake, will order: Ensure Enlive po BID, each supplement provides 350 kcal and 20 grams of protein  Magic cup with L/D, each supplement provides 290 kcal and 9 grams of protein  NUTRITION DIAGNOSIS:  Inadequate oral intake related to poor appetite, lethargy/confusion as evidenced by meal completion < 50%.  GOAL:  Patient will meet greater than or equal to 90% of their needs  MONITOR:  PO intake, Supplement acceptance, Labs, Weight trends, I & O's  REASON FOR ASSESSMENT:  LOS    ASSESSMENT:  84 y/o female PMHx CAD, dementia, htn, CVA. Presented from SNF due to SOB, cough, weakness and fever for roughly 1 day. Workup revealed likely PNA   Seeing patient d/t prolonged LOS and meal documentation showing poor intake.   Pt cognitively impaired. She is unable to provide much information. She says she eats well. She gives a UBW of 150 lbs now and when RD notes she is listed as 180 lbs now, she says "it is all fluid". She asks for help getting on bedpan.   Per nursing staff, pt ate poorly today. She ate minimal at breakfast and nothing at lunch. Reviewing intake documentation during admission, with the exception of only 3 meals, pt has not eaten more than 50% at mealtime. There is a several day span where patient is documented as eating 0% at 6 consecutive meals, noted as being due to pt refusal.   Though pt reports being significantly swollen, do not appreciate any edema on physical exam. Her initial weight is not documented as being measured and is suspicious for being reported/estimated. From chart, she was ~150 lbs ~15 months ago. She is ~182 per bed scale today which is in like with several other wts from this admission.  Suspect her weight gain is r/t to good PO intake at her facility.   Labs: Recent BG: 90-115, Creat: 1.22  Meds: Vit D, H2RA, oral probiotic, mag ox, insulin   Recent  Labs  Lab 02/10/18 1207 02/13/18 0505 02/15/18 0543  NA 143 143 143  K 4.4 4.5 4.6  CL 112* 113* 113*  CO2 22 24 24   BUN 38* 39* 23  CREATININE 1.60* 1.52* 1.22*  1.17*  CALCIUM 9.1 8.3* 8.8*  GLUCOSE 133* 96 98   NUTRITION - FOCUSED PHYSICAL EXAM:   Most Recent Value  Orbital Region  No depletion  Upper Arm Region  No depletion  Thoracic and Lumbar Region  No depletion  Buccal Region  No depletion  Temple Region  No depletion  Clavicle Bone Region  No depletion  Clavicle and Acromion Bone Region  No depletion  Scapular Bone Region  No depletion  Dorsal Hand  No depletion  Patellar Region  No depletion  Anterior Thigh Region  No depletion  Posterior Calf Region  No depletion  Edema (RD Assessment)  None  Hair  Reviewed  Eyes  Reviewed  Mouth  Reviewed  Skin  Reviewed  Nails  Reviewed       Diet Order:   Diet Order            Diet heart healthy/carb modified Room service appropriate? Yes; Fluid consistency: Thin  Diet effective now             EDUCATION NEEDS:  No education needs have been identified at this time  Skin:   MASD to buttocks/groin.   Last BM:  02/12/2018  Height:  Ht Readings  from Last 1 Encounters:  02/03/18 5\' 3"  (1.6 m)   Weight:  Wt Readings from Last 1 Encounters:  02/10/18 85.5 kg   Wt Readings from Last 10 Encounters:  02/10/18 85.5 kg  12/03/16 68 kg  11/17/16 70.1 kg  08/19/16 67.6 kg  08/09/16 63.5 kg  06/19/16 59 kg  05/04/16 59 kg  05/03/16 59 kg   Ideal Body Weight:  52.27 kg  BMI:  Body mass index is 33.39 kg/m.  Estimated Nutritional Needs:  Kcal:  1550-1700 (18-20 kcal/kg bw) Protein:  68-78g Pro (1.3-1.5g/kg ibw) Fluid:  1.6-1.7 L fluid ( 35ml/kcal bw)  Burtis Junes RD, LDN, CNSC Clinical Nutrition Available Tues-Sat via Pager: [phone] 02/15/2018 3:48 PM

## 2018-02-15 NOTE — Progress Notes (Signed)
Subjective: She is more alert.  She has had episodes of not being cooperative.  No other new complaints.  She is eating better.  Creatinine down to 1.1  Objective: Vital signs in last 24 hours: Temp:  [97.4 F (36.3 C)-98.9 F (37.2 C)] 97.4 F (36.3 C) (12/04 0534) Pulse Rate:  [65-69] 69 (12/04 0534) Resp:  [18-22] 18 (12/04 0534) BP: (151-175)/(61-65) 152/65 (12/04 0534) SpO2:  [96 %-100 %] 100 % (12/04 0534) Weight change:  Last BM Date: 02/12/18  Intake/Output from previous day: 12/03 0701 - 12/04 0700 In: 2395.7 [P.O.:1320; I.V.:1075.7] Out: 1350 [Urine:1350]  PHYSICAL EXAM General appearance: alert and no distress Resp: rhonchi bilaterally Cardio: regular rate and rhythm, S1, S2 normal, no murmur, click, rub or gallop GI: soft, non-tender; bowel sounds normal; no masses,  no organomegaly Extremities: extremities normal, atraumatic, no cyanosis or edema  Lab Results:  Results for orders placed or performed during the hospital encounter of 02/03/18 (from the past 48 hour(s))  Glucose, capillary     Status: Abnormal   Collection Time: 02/13/18 11:01 AM  Result Value Ref Range   Glucose-Capillary 180 (H) 70 - 99 mg/dL  Glucose, capillary     Status: Abnormal   Collection Time: 02/13/18  4:14 PM  Result Value Ref Range   Glucose-Capillary 140 (H) 70 - 99 mg/dL  Glucose, capillary     Status: Abnormal   Collection Time: 02/13/18 10:00 PM  Result Value Ref Range   Glucose-Capillary 149 (H) 70 - 99 mg/dL  Glucose, capillary     Status: None   Collection Time: 02/14/18  7:30 AM  Result Value Ref Range   Glucose-Capillary 95 70 - 99 mg/dL  Glucose, capillary     Status: Abnormal   Collection Time: 02/14/18 11:21 AM  Result Value Ref Range   Glucose-Capillary 199 (H) 70 - 99 mg/dL  Glucose, capillary     Status: Abnormal   Collection Time: 02/14/18  4:53 PM  Result Value Ref Range   Glucose-Capillary 139 (H) 70 - 99 mg/dL   Comment 1 Notify RN   Glucose, capillary      Status: Abnormal   Collection Time: 02/14/18  9:13 PM  Result Value Ref Range   Glucose-Capillary 217 (H) 70 - 99 mg/dL   Comment 1 Notify RN    Comment 2 Document in Chart   Creatinine, serum     Status: Abnormal   Collection Time: 02/15/18  5:43 AM  Result Value Ref Range   Creatinine, Ser 1.17 (H) 0.44 - 1.00 mg/dL   GFR calc non Af Amer 42 (L) >60 mL/min   GFR calc Af Amer 48 (L) >60 mL/min    Comment: Performed at [facility], [address] [place], [place] [address]  Glucose, capillary     Status: None   Collection Time: 02/15/18  7:54 AM  Result Value Ref Range   Glucose-Capillary 88 70 - 99 mg/dL    ABGS No results for input(s): PHART, PO2ART, TCO2, HCO3 in the last 72 hours.  Invalid input(s): PCO2 CULTURES Recent Results (from the past 240 hour(s))  Urine Culture     Status: None   Collection Time: 02/10/18  5:40 PM  Result Value Ref Range Status   Specimen Description   Final    URINE, CLEAN CATCH Performed at [facility], [address], [place], [place] [address]    Special Requests   Final    NONE Performed at [facility], [address] [place],  [place] [address]    Culture   Final    NO GROWTH Performed at [facility], [place] [address], [place], [place] [address]    Report Status 02/12/2018 FINAL  Final  Urine Culture     Status: None   Collection Time: 02/12/18 10:35 AM  Result Value Ref Range Status   Specimen Description   Final    URINE, CLEAN CATCH Performed at [facility], [address] [place], [place] [address]    Special Requests   Final    NONE Performed at [facility], [address] [place], [place] [address]    Culture   Final    NO GROWTH Performed at [facility], [place] [address] [place], [place] [address]    Report Status 02/13/2018 FINAL  Final   Studies/Results: No results found.  Medications:  Prior to Admission:  Medications Prior to Admission  Medication Sig Dispense Refill Last Dose  .  acetaminophen (TYLENOL) 325 MG tablet Take 2 tablets (650 mg total) by mouth every 6 (six) hours as needed for mild pain or moderate pain. 60 tablet 3 UNKNOWN  . amLODipine (NORVASC) 10 MG tablet Take 10 mg by mouth daily.    02/02/2018 at Unknown time  . amoxicillin (AMOXIL) 500 MG capsule Take 500 mg by mouth 3 (three) times daily. Give 1 capsule by mouth three times a day for UTI for 10 days.   02/02/2018 at Unknown time  . aspirin EC 81 MG tablet Take 81 mg by mouth daily.   02/02/2018 at Unknown time  . atorvastatin (LIPITOR) 20 MG tablet Take 20 mg by mouth daily.    02/02/2018 at Unknown time  . cetirizine (ZYRTEC) 10 MG tablet Take 10 mg by mouth at bedtime as needed for allergies.     . cholecalciferol (VITAMIN D) 1000 units tablet Take 1,000 Units by mouth daily.    02/02/2018 at Unknown time  . clotrimazole-betamethasone (LOTRISONE) cream Apply 1 application topically 2 (two) times daily.   02/02/2018 at Unknown time  . dextromethorphan-guaiFENesin (ROBITUSSIN-DM) 10-100 MG/5ML liquid Take 5 mLs by mouth every 4 (four) hours as needed for cough. Give 5cc by mouth every 4 hours as needed for cough.   not in past 5 days  . dextrose (GLUTOSE 15) 40 % GEL Take 1 Tube by mouth. Give 1 application by mouth as needed for blood sugar less than 60 recheck blood sugar in 15 minutes; notify MD if blood sugar doesn't increase.   not in past 5 days  . famotidine (PEPCID) 20 MG tablet Take 20 mg by mouth daily.    02/02/2018 at Unknown time  . fluticasone (FLONASE) 50 MCG/ACT nasal spray Place 2 sprays into both nostrils daily.   02/02/2018 at Unknown time  . hydrALAZINE (APRESOLINE) 25 MG tablet Take 25 mg by mouth 3 (three) times daily. Give 1 tablet by mouth three times a day for HTN hold for systolic BP <737   10/62/6948 at Unknown time  . insulin glargine (LANTUS) 100 UNIT/ML injection Inject 0.12 mLs (12 Units total) into the skin daily. (Patient taking differently: Inject 28 Units into the skin  every morning. ) 10 mL 11 02/03/2018 at Unknown time  . levothyroxine (SYNTHROID, LEVOTHROID) 50 MCG tablet Take 50 mcg by mouth daily before breakfast.    02/02/2018 at Unknown time  . magnesium oxide (MAG-OX) 400 MG tablet Take 400 mg by mouth daily.    02/02/2018 at Unknown time  . Multiple Vitamin (  MULTIVITAMIN WITH MINERALS) TABS tablet Take 1 tablet by mouth daily.    02/02/2018 at Unknown time  . naproxen sodium (ALEVE) 220 MG tablet Take 220 mg by mouth every 12 (twelve) hours as needed.   not in past 5 days  . traZODone (DESYREL) 50 MG tablet Take 50 mg by mouth at bedtime.   02/02/2018 at Unknown time  . trimethoprim (TRIMPEX) 100 MG tablet Take 100 mg by mouth daily. Give 1 tablet by mouth one time a day for preventative.   02/03/2018 at Unknown time  . Wheat Dextrin (BENEFIBER PO) Take 10 mLs by mouth daily.    02/02/2018 at Unknown time  . insulin aspart (NOVOLOG FLEXPEN) 100 UNIT/ML FlexPen Inject 2-10 Units into the skin 3 (three) times daily. Give per sliding scale three times a day:  201-250= 2 UNITS 251-300= 4 UNITS 301-350= 6 UNITS 351-400= 8 UNITS 401-450=10UNITS   Not Taking at Unknown time   Scheduled: . amLODipine  10 mg Oral Daily  . aspirin EC  81 mg Oral Daily  . atorvastatin  20 mg Oral Daily  . cholecalciferol  1,000 Units Oral Daily  . famotidine  20 mg Oral Daily  . fluticasone  2 spray Each Nare Daily  . hydrALAZINE  25 mg Oral TID  . insulin aspart  2-10 Units Subcutaneous TID  . insulin glargine  12 Units Subcutaneous Daily  . levothyroxine  50 mcg Oral QAC breakfast  . magnesium oxide  400 mg Oral Daily  . multivitamin with minerals  1 tablet Oral Daily  . nystatin   Topical BID  . saccharomyces boulardii  250 mg Oral BID  . sodium chloride flush  3 mL Intravenous Q12H  . traZODone  50 mg Oral QHS   Continuous: . sodium chloride 50 mL/hr at 02/14/18 1254   GPQ:DIYMEB chloride, acetaminophen, guaiFENesin-dextromethorphan, sodium chloride  flush  Assesment: She was admitted with healthcare associated pneumonia and she is better.  She developed acute urinary retention and has a Foley catheter in place.  Her creatinine has improved.  She had acute on chronic renal failure.  She has dementia at baseline and had more trouble with confusion.  This has improved but is still present Principal Problem:   HCAP (healthcare-associated pneumonia) Active Problems:   Coronary artery disease   History of CVA in adulthood   Pneumonia    Plan: Check renal ultrasound.  Discontinue IV fluids.  Potential transfer to skilled care facility probably tomorrow    LOS: 11 days   Linh Johannes L 02/15/2018, 9:21 AM

## 2018-02-16 ENCOUNTER — Inpatient Hospital Stay
Admission: RE | Admit: 2018-02-16 | Discharge: 2018-03-13 | Disposition: A | Discharge status: Home / Self Care | Payer: Medicare Other | Source: Ambulatory Visit | Attending: Internal Medicine | Admitting: Internal Medicine

## 2018-02-16 DIAGNOSIS — Z978 Presence of other specified devices: Secondary | ICD-10-CM | POA: Diagnosis not present

## 2018-02-16 DIAGNOSIS — M6281 Muscle weakness (generalized): Secondary | ICD-10-CM | POA: Diagnosis not present

## 2018-02-16 DIAGNOSIS — E039 Hypothyroidism, unspecified: Secondary | ICD-10-CM | POA: Diagnosis not present

## 2018-02-16 DIAGNOSIS — D649 Anemia, unspecified: Secondary | ICD-10-CM | POA: Diagnosis not present

## 2018-02-16 DIAGNOSIS — E46 Unspecified protein-calorie malnutrition: Secondary | ICD-10-CM | POA: Diagnosis not present

## 2018-02-16 DIAGNOSIS — F0390 Unspecified dementia without behavioral disturbance: Secondary | ICD-10-CM | POA: Diagnosis not present

## 2018-02-16 DIAGNOSIS — I69391 Dysphagia following cerebral infarction: Secondary | ICD-10-CM | POA: Diagnosis not present

## 2018-02-16 DIAGNOSIS — R279 Unspecified lack of coordination: Secondary | ICD-10-CM | POA: Diagnosis not present

## 2018-02-16 DIAGNOSIS — N183 Chronic kidney disease, stage 3 (moderate): Secondary | ICD-10-CM | POA: Diagnosis not present

## 2018-02-16 DIAGNOSIS — M1990 Unspecified osteoarthritis, unspecified site: Secondary | ICD-10-CM | POA: Diagnosis not present

## 2018-02-16 DIAGNOSIS — J189 Pneumonia, unspecified organism: Secondary | ICD-10-CM | POA: Diagnosis not present

## 2018-02-16 DIAGNOSIS — N390 Urinary tract infection, site not specified: Secondary | ICD-10-CM | POA: Diagnosis not present

## 2018-02-16 DIAGNOSIS — E1140 Type 2 diabetes mellitus with diabetic neuropathy, unspecified: Secondary | ICD-10-CM | POA: Diagnosis not present

## 2018-02-16 DIAGNOSIS — R339 Retention of urine, unspecified: Secondary | ICD-10-CM | POA: Diagnosis not present

## 2018-02-16 DIAGNOSIS — R338 Other retention of urine: Secondary | ICD-10-CM | POA: Diagnosis not present

## 2018-02-16 DIAGNOSIS — E1165 Type 2 diabetes mellitus with hyperglycemia: Secondary | ICD-10-CM | POA: Diagnosis not present

## 2018-02-16 DIAGNOSIS — N179 Acute kidney failure, unspecified: Secondary | ICD-10-CM | POA: Diagnosis not present

## 2018-02-16 DIAGNOSIS — I6203 Nontraumatic chronic subdural hemorrhage: Secondary | ICD-10-CM | POA: Diagnosis not present

## 2018-02-16 DIAGNOSIS — D3000 Benign neoplasm of unspecified kidney: Secondary | ICD-10-CM | POA: Diagnosis not present

## 2018-02-16 DIAGNOSIS — I2510 Atherosclerotic heart disease of native coronary artery without angina pectoris: Secondary | ICD-10-CM | POA: Diagnosis not present

## 2018-02-16 DIAGNOSIS — I10 Essential (primary) hypertension: Secondary | ICD-10-CM | POA: Diagnosis not present

## 2018-02-16 DIAGNOSIS — E119 Type 2 diabetes mellitus without complications: Secondary | ICD-10-CM | POA: Diagnosis not present

## 2018-02-16 LAB — GLUCOSE, CAPILLARY
GLUCOSE-CAPILLARY: 159 mg/dL — AB (ref 70–99)
Glucose-Capillary: 99 mg/dL (ref 70–99)

## 2018-02-16 MED ORDER — INSULIN ASPART 100 UNIT/ML ~~LOC~~ SOLN: 2.0000 [IU] | Freq: Three times a day (TID) | SUBCUTANEOUS | 11 refills | Status: DC

## 2018-02-16 MED ORDER — ENSURE ENLIVE PO LIQD: 237.0000 mL | Freq: Two times a day (BID) | ORAL | 12 refills | Status: DC

## 2018-02-16 MED ORDER — INSULIN GLARGINE 100 UNIT/ML ~~LOC~~ SOLN: 12.0000 [IU] | Freq: Every day | SUBCUTANEOUS | 11 refills | Status: DC

## 2018-02-16 NOTE — Progress Notes (Signed)
Subjective: She feels better.  She ate well at breakfast.  She still has some confusion.  Her Foley catheter had been removed but she had urinary retention again so it is back and and I think it will need to remain and until she is better and then we can try removing the Foley again.  She may require urology consultation  Objective: Vital signs in last 24 hours: Temp:  [98 F (36.7 C)-98.7 F (37.1 C)] 98 F (36.7 C) (12/05 0700) Pulse Rate:  [67-73] 67 (12/05 0700) Resp:  [18-20] 20 (12/04 2223) BP: (172-173)/(68-70) 173/69 (12/05 0700) SpO2:  [95 %-99 %] 95 % (12/05 0700) Weight change:  Last BM Date: 02/14/18  Intake/Output from previous day: 12/04 0701 - 12/05 0700 In: 240 [P.O.:240] Out: -   PHYSICAL EXAM General appearance: alert, cooperative, no distress and Mildly confused Resp: clear to auscultation bilaterally Cardio: regular rate and rhythm, S1, S2 normal, no murmur, click, rub or gallop GI: soft, non-tender; bowel sounds normal; no masses,  no organomegaly Extremities: extremities normal, atraumatic, no cyanosis or edema  Lab Results:  Results for orders placed or performed during the hospital encounter of 02/03/18 (from the past 48 hour(s))  Glucose, capillary     Status: Abnormal   Collection Time: 02/14/18 11:21 AM  Result Value Ref Range   Glucose-Capillary 199 (H) 70 - 99 mg/dL  Glucose, capillary     Status: Abnormal   Collection Time: 02/14/18  4:53 PM  Result Value Ref Range   Glucose-Capillary 139 (H) 70 - 99 mg/dL   Comment 1 Notify RN   Glucose, capillary     Status: Abnormal   Collection Time: 02/14/18  9:13 PM  Result Value Ref Range   Glucose-Capillary 217 (H) 70 - 99 mg/dL   Comment 1 Notify RN    Comment 2 Document in Chart   Creatinine, serum     Status: Abnormal   Collection Time: 02/15/18  5:43 AM  Result Value Ref Range   Creatinine, Ser 1.17 (H) 0.44 - 1.00 mg/dL   GFR calc non Af Amer 42 (L) >60 mL/min   GFR calc Af Amer 48 (L) >60  mL/min    Comment: Performed at [facility], [address] [place], [place] [address]  Basic metabolic panel     Status: Abnormal   Collection Time: 02/15/18  5:43 AM  Result Value Ref Range   Sodium 143 135 - 145 mmol/L   Potassium 4.6 3.5 - 5.1 mmol/L   Chloride 113 (H) 98 - 111 mmol/L   CO2 24 22 - 32 mmol/L   Glucose, Bld 98 70 - 99 mg/dL   BUN 23 8 - 23 mg/dL   Creatinine, Ser 1.22 (H) 0.44 - 1.00 mg/dL   Calcium 8.8 (L) 8.9 - 10.3 mg/dL   GFR calc non Af Amer 40 (L) >60 mL/min   GFR calc Af Amer 46 (L) >60 mL/min   Anion gap 6 5 - 15    Comment: Performed at [facility], [address] [place], [place] [address]  Glucose, capillary     Status: None   Collection Time: 02/15/18  7:54 AM  Result Value Ref Range   Glucose-Capillary 88 70 - 99 mg/dL  Glucose, capillary     Status: Abnormal   Collection Time: 02/15/18  9:58 AM  Result Value Ref Range   Glucose-Capillary 111 (H) 70 - 99 mg/dL  Glucose, capillary     Status: Abnormal   Collection Time: 02/15/18  12:19 PM  Result Value Ref Range   Glucose-Capillary 111 (H) 70 - 99 mg/dL  Glucose, capillary     Status: None   Collection Time: 02/15/18  3:32 PM  Result Value Ref Range   Glucose-Capillary 97 70 - 99 mg/dL   Comment 1 Notify RN    Comment 2 Document in Chart   Glucose, capillary     Status: None   Collection Time: 02/15/18  4:36 PM  Result Value Ref Range   Glucose-Capillary 92 70 - 99 mg/dL  Glucose, capillary     Status: Abnormal   Collection Time: 02/15/18  9:26 PM  Result Value Ref Range   Glucose-Capillary 158 (H) 70 - 99 mg/dL  Glucose, capillary     Status: None   Collection Time: 02/16/18  7:42 AM  Result Value Ref Range   Glucose-Capillary 99 70 - 99 mg/dL    ABGS No results for input(s): PHART, PO2ART, TCO2, HCO3 in the last 72 hours.  Invalid input(s): PCO2 CULTURES Recent Results (from the past 240 hour(s))  Urine Culture     Status: None   Collection Time: 02/10/18  5:40 PM  Result  Value Ref Range Status   Specimen Description   Final    URINE, CLEAN CATCH Performed at [facility], [address] [place], [place] [address]    Special Requests   Final    NONE Performed at [facility], [address], [place], [place] [address]    Culture   Final    NO GROWTH Performed at [facility], [place] [address], [place], [place] [address]    Report Status 02/12/2018 FINAL  Final  Urine Culture     Status: None   Collection Time: 02/12/18 10:35 AM  Result Value Ref Range Status   Specimen Description   Final    URINE, CLEAN CATCH Performed at [facility], [address], [place], [place] [address]    Special Requests   Final    NONE Performed at [facility], [address], [place], [place] [address]    Culture   Final    NO GROWTH Performed at [facility], [place] [address], [place], [place] [address]    Report Status 02/13/2018 FINAL  Final   Studies/Results: US Renal  Result Date: 02/15/2018 CLINICAL DATA:  Urinary retention. EXAM: RENAL / URINARY TRACT ULTRASOUND COMPLETE COMPARISON:  CT scan February 11, 2018 FINDINGS: Right Kidney: Renal measurements: 10.2 x 5.2 x 6.4 cm = volume: 175.5 mL. Contains a 2.1 cm cyst. No hydronephrosis. Left Kidney: Renal measurements: 12.7 x 4.9 x 4.7 cm = volume: 152.4 mL. Contains 2 cysts with the largest measuring 2.9 cm. Bladder: Decompressed and not well evaluated. IMPRESSION: 1. Renal cysts.  No hydronephrosis. 2. The bladder is decompressed and not well evaluated. Electronically Signed   By: Dorise Bullion III M.D   On: 02/15/2018 14:15    Medications:  Prior to Admission:  Medications Prior to Admission  Medication Sig Dispense Refill Last Dose  . acetaminophen (TYLENOL) 325 MG tablet Take 2 tablets (650 mg total) by mouth every 6 (six) hours as needed for mild pain or moderate pain. 60 tablet 3 UNKNOWN  . amLODipine (NORVASC) 10 MG tablet Take 10 mg by mouth daily.    02/02/2018 at Unknown time  .  amoxicillin (AMOXIL) 500 MG capsule Take 500 mg by mouth 3 (three) times daily. Give 1 capsule by mouth three times a day for UTI for 10  days.   02/02/2018 at Unknown time  . aspirin EC 81 MG tablet Take 81 mg by mouth daily.   02/02/2018 at Unknown time  . atorvastatin (LIPITOR) 20 MG tablet Take 20 mg by mouth daily.    02/02/2018 at Unknown time  . cetirizine (ZYRTEC) 10 MG tablet Take 10 mg by mouth at bedtime as needed for allergies.     . cholecalciferol (VITAMIN D) 1000 units tablet Take 1,000 Units by mouth daily.    02/02/2018 at Unknown time  . clotrimazole-betamethasone (LOTRISONE) cream Apply 1 application topically 2 (two) times daily.   02/02/2018 at Unknown time  . dextromethorphan-guaiFENesin (ROBITUSSIN-DM) 10-100 MG/5ML liquid Take 5 mLs by mouth every 4 (four) hours as needed for cough. Give 5cc by mouth every 4 hours as needed for cough.   not in past 5 days  . dextrose (GLUTOSE 15) 40 % GEL Take 1 Tube by mouth. Give 1 application by mouth as needed for blood sugar less than 60 recheck blood sugar in 15 minutes; notify MD if blood sugar doesn't increase.   not in past 5 days  . famotidine (PEPCID) 20 MG tablet Take 20 mg by mouth daily.    02/02/2018 at Unknown time  . fluticasone (FLONASE) 50 MCG/ACT nasal spray Place 2 sprays into both nostrils daily.   02/02/2018 at Unknown time  . hydrALAZINE (APRESOLINE) 25 MG tablet Take 25 mg by mouth 3 (three) times daily. Give 1 tablet by mouth three times a day for HTN hold for systolic BP <607   37/12/6267 at Unknown time  . insulin glargine (LANTUS) 100 UNIT/ML injection Inject 0.12 mLs (12 Units total) into the skin daily. (Patient taking differently: Inject 28 Units into the skin every morning. ) 10 mL 11 02/03/2018 at Unknown time  . levothyroxine (SYNTHROID, LEVOTHROID) 50 MCG tablet Take 50 mcg by mouth daily before breakfast.    02/02/2018 at Unknown time  . magnesium oxide (MAG-OX) 400 MG tablet Take 400 mg by mouth daily.     02/02/2018 at Unknown time  . Multiple Vitamin (MULTIVITAMIN WITH MINERALS) TABS tablet Take 1 tablet by mouth daily.    02/02/2018 at Unknown time  . naproxen sodium (ALEVE) 220 MG tablet Take 220 mg by mouth every 12 (twelve) hours as needed.   not in past 5 days  . traZODone (DESYREL) 50 MG tablet Take 50 mg by mouth at bedtime.   02/02/2018 at Unknown time  . trimethoprim (TRIMPEX) 100 MG tablet Take 100 mg by mouth daily. Give 1 tablet by mouth one time a day for preventative.   02/03/2018 at Unknown time  . Wheat Dextrin (BENEFIBER PO) Take 10 mLs by mouth daily.    02/02/2018 at Unknown time  . insulin aspart (NOVOLOG FLEXPEN) 100 UNIT/ML FlexPen Inject 2-10 Units into the skin 3 (three) times daily. Give per sliding scale three times a day:  201-250= 2 UNITS 251-300= 4 UNITS 301-350= 6 UNITS 351-400= 8 UNITS 401-450=10UNITS   Not Taking at Unknown time   Scheduled: . amLODipine  10 mg Oral Daily  . aspirin EC  81 mg Oral Daily  . atorvastatin  20 mg Oral Daily  . cholecalciferol  1,000 Units Oral Daily  . famotidine  20 mg Oral Daily  . feeding supplement (ENSURE ENLIVE)  237 mL Oral BID BM  . fluticasone  2 spray Each Nare Daily  . hydrALAZINE  25 mg Oral TID  . insulin aspart  2-10 Units Subcutaneous TID  .  insulin glargine  12 Units Subcutaneous Daily  . levothyroxine  50 mcg Oral QAC breakfast  . magnesium oxide  400 mg Oral Daily  . multivitamin with minerals  1 tablet Oral Daily  . nystatin   Topical BID  . saccharomyces boulardii  250 mg Oral BID  . traZODone  50 mg Oral QHS   Continuous:  WSF:[id number], guaiFENesin-dextromethorphan  Assesment: She was admitted with healthcare associated pneumonia and she is over that.  She has diabetes on sliding scale with good control.  She has coronary disease but no symptoms.  She has previous history of CVA and that has left her with some dementia which gets worse when she gets acutely ill.  She is had acute urinary  retention and she is going to go to the skilled care facility with a Foley catheter in place. Principal Problem:   HCAP (healthcare-associated pneumonia) Active Problems:   Coronary artery disease   History of CVA in adulthood   Pneumonia    Plan: Transfer to skilled care facility    LOS: 12 days   Regina Bush L 02/16/2018, 9:01 AM

## 2018-02-16 NOTE — Clinical Social Work Placement (Signed)
   CLINICAL SOCIAL WORK PLACEMENT  NOTE  Date:  02/16/2018  Patient Details  Name: Regina Bush MRN: [id number] Date of Birth: Jun 07, 1927  Clinical Social Work is seeking post-discharge placement for this patient at the [place] level of care (*CSW will initial, date and re-position this form in  chart as items are completed):  Yes   Patient/family provided with [facility] Work Department's list of facilities offering this level of care within the geographic area requested by the patient (or if unable, by the patient's family).  Yes   Patient/family informed of their freedom to choose among providers that offer the needed level of care, that participate in Medicare, Medicaid or managed care program needed by the patient, have an available bed and are willing to accept the patient.  Yes   Patient/family informed of [facility]'s ownership interest in [facility] and [facility], as well as of the fact that they are under no obligation to receive care at these facilities.  PASRR submitted to EDS on       PASRR number received on       Existing PASRR number confirmed on 02/08/18     FL2 transmitted to all facilities in geographic area requested by pt/family on 02/08/18     FL2 transmitted to all facilities within larger geographic area on 02/13/18     Patient informed that his/her managed care company has contracts with or will negotiate with certain facilities, including the following:        Yes   Patient/family informed of bed offers received.  Patient chooses bed at [facility]     Physician recommends and patient chooses bed at      Patient to be transferred to [facility] on 02/16/18.  Patient to be transferred to facility by [facility] staff     Patient family notified on 02/16/18 of transfer.  Name of family member notified:  Jacquelyne Balint, daughter     PHYSICIAN       Additional Comment:  Discharge clinicals  sent. Facility notified of discharge. LCSW signing off.   _______________________________________________ Ihor Gully, LCSW 02/16/2018, 11:49 AM

## 2018-02-16 NOTE — Discharge Summary (Signed)
Physician Discharge Summary  Patient ID: Regina Bush MRN: [id number] DOB/AGE: 11/06/1927 85 y.o. Primary Care Physician:Kareema Keitt, Percell Miller, MD Admit date: 02/03/2018 Discharge date: 02/16/2018    Discharge Diagnoses:   Principal Problem:   HCAP (healthcare-associated pneumonia) Active Problems:   Coronary artery disease   History of CVA in adulthood   Pneumonia acute urinary retention dementia deconditioning Allergies as of 02/16/2018      Reactions   Cymbalta [duloxetine Hcl] Other (See Comments)   unknown   Effexor [venlafaxine] Other (See Comments)   unknown   Morphine And Related Other (See Comments)   Passed out    Tape    adhesive      Medication List    STOP taking these medications   amoxicillin 500 MG capsule Commonly known as:  AMOXIL   NOVOLOG FLEXPEN 100 UNIT/ML FlexPen Generic drug:  insulin aspart Replaced by:  insulin aspart 100 UNIT/ML injection     TAKE these medications   acetaminophen 325 MG tablet Commonly known as:  TYLENOL Take 2 tablets (650 mg total) by mouth every 6 (six) hours as needed for mild pain or moderate pain.   amLODipine 10 MG tablet Commonly known as:  NORVASC Take 10 mg by mouth daily.   aspirin EC 81 MG tablet Take 81 mg by mouth daily.   atorvastatin 20 MG tablet Commonly known as:  LIPITOR Take 20 mg by mouth daily.   BENEFIBER PO Take 10 mLs by mouth daily.   cetirizine 10 MG tablet Commonly known as:  ZYRTEC Take 10 mg by mouth at bedtime as needed for allergies.   cholecalciferol 25 MCG (1000 UT) tablet Commonly known as:  VITAMIN D Take 1,000 Units by mouth daily.   clotrimazole-betamethasone cream Commonly known as:  LOTRISONE Apply 1 application topically 2 (two) times daily.   dextromethorphan-guaiFENesin 10-100 MG/5ML liquid Commonly known as:  ROBITUSSIN-DM Take 5 mLs by mouth every 4 (four) hours as needed for cough. Give 5cc by mouth every 4 hours as needed for cough.   famotidine 20 MG  tablet Commonly known as:  PEPCID Take 20 mg by mouth daily.   feeding supplement (ENSURE ENLIVE) Liqd Take 237 mLs by mouth 2 (two) times daily between meals.   fluticasone 50 MCG/ACT nasal spray Commonly known as:  FLONASE Place 2 sprays into both nostrils daily.   GLUTOSE 15 40 % Gel Generic drug:  dextrose Take 1 Tube by mouth. Give 1 application by mouth as needed for blood sugar less than 60 recheck blood sugar in 15 minutes; notify MD if blood sugar doesn't increase.   hydrALAZINE 25 MG tablet Commonly known as:  APRESOLINE Take 25 mg by mouth 3 (three) times daily. Give 1 tablet by mouth three times a day for HTN hold for systolic BP <892   insulin aspart 100 UNIT/ML injection Commonly known as:  novoLOG Inject 2-10 Units into the skin 3 (three) times daily. Replaces:  NOVOLOG FLEXPEN 100 UNIT/ML FlexPen   insulin glargine 100 UNIT/ML injection Commonly known as:  LANTUS Inject 0.12 mLs (12 Units total) into the skin daily. What changed:    how much to take  when to take this   levothyroxine 50 MCG tablet Commonly known as:  SYNTHROID, LEVOTHROID Take 50 mcg by mouth daily before breakfast.   magnesium oxide 400 MG tablet Commonly known as:  MAG-OX Take 400 mg by mouth daily.   multivitamin with minerals Tabs tablet Take 1 tablet by mouth daily.   naproxen sodium 220  MG tablet Commonly known as:  ALEVE Take 220 mg by mouth every 12 (twelve) hours as needed.   traZODone 50 MG tablet Commonly known as:  DESYREL Take 50 mg by mouth at bedtime.   trimethoprim 100 MG tablet Commonly known as:  TRIMPEX Take 100 mg by mouth daily. Give 1 tablet by mouth one time a day for preventative.       Discharged Condition:improved    Consults:none  Significant Diagnostic Studies: Ct Abdomen Pelvis Wo Contrast  Result Date: 02/11/2018 CLINICAL DATA:  Abdominal pain. EXAM: CT ABDOMEN AND PELVIS WITHOUT CONTRAST TECHNIQUE: Multidetector CT imaging of the  abdomen and pelvis was performed following the standard protocol without IV contrast. COMPARISON:  None FINDINGS: Lower chest: No acute abnormality. Hepatobiliary: No focal liver abnormality is seen. No gallstones, gallbladder wall thickening, or biliary dilatation. Pancreas: Unremarkable. No pancreatic ductal dilatation or surrounding inflammatory changes. Spleen: Normal in size without focal abnormality. Adrenals/Urinary Tract: Small calcifications are identified within both adrenal glands. No discrete mass identified. Bilateral kidney cysts are noted which are incompletely characterized without IV contrast material. Dominant cyst arises from the medial cortex of the inferior pole of right kidney measuring 5.2 cm. Hyperdense lesion arising from the upper pole of the left kidney measures 2.1 cm and 35 HU. This may represent a hemorrhagic cyst or solid kidney lesion. Bilateral pelvocaliectasis is identified along with hydroureter. The urinary bladder is markedly distended measuring 12.5 by 12.8 by 15.1 cm (volume = 1270 cm^3). Stomach/Bowel: Stomach appears nondistended. There is no small or large bowel dilatation. Within the limitations of unenhanced technique there is no bowel wall thickening or inflammation noted. There is a large ventral abdominal wall hernia measuring 12.6 cm and contains nonobstructed loops of large and small bowel. The appendix is visualized and appears normal. Vascular/Lymphatic: Aortic atherosclerosis. No aneurysm. No enlarged abdominal or pelvic lymph nodes. Reproductive: Uterus and bilateral adnexa are unremarkable. Other: No free fluid or fluid collections within the abdomen or pelvis. Musculoskeletal: No acute or significant osseous findings. IMPRESSION: 1. Marked distension of the urinary bladder with bilateral pelvocaliectasis and hydroureter. The bladder as a volume of 1270 mL. 2. Large ventral abdominal wall hernia contains nonobstructed loops of small and large bowel. 3. Bilateral  kidney lesions of varying density are incompletely characterized without IV contrast material. This includes a hyperdense lesion arising from the upper pole of left kidney which may represent a solid kidney lesion or hemorrhagic cysts. 4.  Aortic Atherosclerosis (ICD10-I70.0). Electronically Signed   By: Kerby Moors M.D.   On: 02/11/2018 14:35   Dg Abd 1 View  Result Date: 02/10/2018 CLINICAL DATA:  Abdominal pain. EXAM: ABDOMEN - 1 VIEW COMPARISON:  Ultrasound 11/16/2016.  05/03/2016. FINDINGS: Surgical clips noted over the abdomen. Soft tissue structures are unremarkable. No bowel distention. No free air. Aortoiliac atherosclerotic vascular calcification. Degenerative change lumbar spine and both hips IMPRESSION: No acute abnormality. Electronically Signed   By: Marcello Moores  Register   On: 02/10/2018 11:44   US Renal  Result Date: 02/15/2018 CLINICAL DATA:  Urinary retention. EXAM: RENAL / URINARY TRACT ULTRASOUND COMPLETE COMPARISON:  CT scan February 11, 2018 FINDINGS: Right Kidney: Renal measurements: 10.2 x 5.2 x 6.4 cm = volume: 175.5 mL. Contains a 2.1 cm cyst. No hydronephrosis. Left Kidney: Renal measurements: 12.7 x 4.9 x 4.7 cm = volume: 152.4 mL. Contains 2 cysts with the largest measuring 2.9 cm. Bladder: Decompressed and not well evaluated. IMPRESSION: 1. Renal cysts.  No hydronephrosis. 2. The bladder  is decompressed and not well evaluated. Electronically Signed   By: Dorise Bullion III M.D   On: 02/15/2018 14:15   Dg Chest Port 1 View  Result Date: 02/06/2018 CLINICAL DATA:  Pneumonia, shortness of breath, fever EXAM: PORTABLE CHEST 1 VIEW COMPARISON:  02/03/2018 FINDINGS: Cardiomegaly with vascular congestion and mild bilateral interstitial and alveolar opacities, similar prior study most compatible with CHF. Possible small effusions. No acute bony abnormality. Low lung volumes. IMPRESSION: Continued mild CHF, unchanged. Question small effusions. Electronically Signed   By: Rolm Baptise M.D.   On: 02/06/2018 10:14   Dg Chest Portable 1 View  Result Date: 02/03/2018 CLINICAL DATA:  Did dyspnea EXAM: PORTABLE CHEST 1 VIEW COMPARISON:  11/15/2016 FINDINGS: Cardiomegaly with aortic atherosclerosis. Diffuse bilateral pulmonary vascular redistribution and engorgement consistent with mild CHF. Definite effusion or pneumothorax. Osteoarthritis of the included shoulders. IMPRESSION: Cardiomegaly with mild CHF. Nonaneurysmal atherosclerotic aorta with uncoiling. Electronically Signed   By: Ashley Royalty M.D.   On: 02/03/2018 19:48    Lab Results: Basic Metabolic Panel: Recent Labs    02/15/18 0543  NA 143  K 4.6  CL 113*  CO2 24  GLUCOSE 98  BUN 23  CREATININE 1.22*  1.17*  CALCIUM 8.8*   Liver Function Tests: No results for input(s): AST, ALT, ALKPHOS, BILITOT, PROT, ALBUMIN in the last 72 hours.   CBC: No results for input(s): WBC, NEUTROABS, HGB, HCT, MCV, PLT in the last 72 hours.  Recent Results (from the past 240 hour(s))  Urine Culture     Status: None   Collection Time: 02/10/18  5:40 PM  Result Value Ref Range Status   Specimen Description   Final    URINE, CLEAN CATCH Performed at [facility], [address] [place], [place] [address]    Special Requests   Final    NONE Performed at [facility], [address], [place], [place] [address]    Culture   Final    NO GROWTH Performed at [facility], [place] [address] [place], [place] [address]    Report Status 02/12/2018 FINAL  Final  Urine Culture     Status: None   Collection Time: 02/12/18 10:35 AM  Result Value Ref Range Status   Specimen Description   Final    URINE, CLEAN CATCH Performed at [facility], [address], [place], [place] [address]    Special Requests   Final    NONE Performed at [facility], [address], [place], [place] [address]    Culture   Final    NO GROWTH Performed at [facility], [place] [address] [place],  [address]    Report Status  02/13/2018 FINAL  Final     Hospital Course: She was admitted with pneumonia.  She was treated and improved but had trouble with confusion.  She ended up having acute urinary retention and is going to have to go to the skilled care facility with a Foley catheter.  Her her pneumonia is much better.  She had some hydronephrosis which has resolved.  She has been confused and she does have dementia which gets worse when she has acute medical illness.  She is at maximum hospital benefit now.  Discharge Exam: Blood pressure (!) 173/69, pulse 67, temperature 98 F (36.7 C), temperature source Oral, resp. rate 20, height 5\' 3"  (1.6 m), weight 85.5 kg, SpO2 95 %. She is awake and alert.  Confused.  Chest is clear.  Heart  is regular.  Disposition: To skilled care facility.  She will need PT/OT and perhaps speech.  Foley catheter will remain in with trials to remove the Foley catheter.  Will be on facility sliding scale insulin sensitive.  She may need urology consultation.    Contact information for after-discharge care    [facility] .   Service:  Skilled Nursing Contact information: [address] [place] [phone]              Signed: Alonza Bogus   02/16/2018, 9:10 AM

## 2018-02-16 NOTE — Progress Notes (Signed)
IV removed, 2x2 gauze and paper tape applied to site, patient tolerated well.  Report called to Izora Gala, LPN at [facility]. Patient's daughter aware of patient being discharged to the [facility].

## 2018-02-17 ENCOUNTER — Encounter: Payer: Self-pay | Admitting: Internal Medicine

## 2018-02-17 ENCOUNTER — Non-Acute Institutional Stay (SKILLED_NURSING_FACILITY): Payer: Medicare Other | Admitting: Internal Medicine

## 2018-02-17 DIAGNOSIS — E119 Type 2 diabetes mellitus without complications: Secondary | ICD-10-CM

## 2018-02-17 DIAGNOSIS — N179 Acute kidney failure, unspecified: Secondary | ICD-10-CM | POA: Diagnosis not present

## 2018-02-17 DIAGNOSIS — R339 Retention of urine, unspecified: Secondary | ICD-10-CM

## 2018-02-17 DIAGNOSIS — I10 Essential (primary) hypertension: Secondary | ICD-10-CM

## 2018-02-17 DIAGNOSIS — E039 Hypothyroidism, unspecified: Secondary | ICD-10-CM | POA: Diagnosis not present

## 2018-02-17 DIAGNOSIS — J189 Pneumonia, unspecified organism: Secondary | ICD-10-CM | POA: Diagnosis not present

## 2018-02-17 NOTE — Progress Notes (Signed)
Location:    [facility] Room Number: 152/D Place of Service:  SNF (31) Provider:  Murray Hodgkins, MD  Patient Care Team: Sinda Du, MD as PCP - General (Pulmonary Disease)  Extended Emergency Contact Information Primary Emergency Contact: Knowlton,[name] Address: [address]          [place], [place] [address] Johnnette Litter of [place] Phone: [phone] Mobile Phone: [phone] Relation: Daughter  Code Status:  DNR Goals of care: Advanced Directive information Advanced Directives 02/17/2018  Does Patient Have a Medical Advance Directive? Yes  Type of Advance Directive Out of facility DNR (pink MOST or yellow form)  Does patient want to make changes to medical advance directive? No - Patient declined  Copy of [place] in Chart? -  Would patient like information on creating a medical advance directive? No - Patient declined     Chief Complaint  Patient presents with  . Hospitalization Follow-up    Hospitalization F/U Visit   --- Follow-up status post hospitalization for pneumonia  HPI:  Pt is a 84 y.o. female seen today for a hospital f/u for pneumonia. Apparently she came from a skilled nursing facility with fever shortness of breath weakness and cough.  With increased weakness.  She was diagnosed with pneumonia and treated aggressively with antibiotics and apparently she had appropriate improvement in her status.  She does continue on oxygen.  She also had acute urinary retention and currently has a Foley catheter I suspect she may need urology follow-up and a trial course of the catheter at some point.  Apparently she did have hydronephrosis which resolved.  Her renal function appears to be improving it was 1.22 on lab done 2 days ago.  It had been up over 1.6.  Her other diagnoses include dementia-coronary artery disease history of CVA hypertension allergic rhinitis GERD and type 2  diabetes in addition to hypothyroidism.  She is currently resting in bed comfortably vital signs appear to be stable she has no complaints she does continue on oxygen.       Past Medical History:  Diagnosis Date  . Anemia   . Arthritis   . Cerebral infarction ([place])   . Coronary artery disease   . Dementia ([place])   . Diabetes mellitus without complication (HCC)    Type 2  . Dysphagia due to recent cerebral infarction   . Failure to thrive (0-17)   . Hypercalcemia   . Hypertension   . Pneumonitis   . Protein calorie malnutrition ([place])   . Subdural hematoma ([place])   . Thyroid disease    History reviewed. No pertinent surgical history.  Allergies  Allergen Reactions  . Cymbalta [Duloxetine Hcl] Other (See Comments)    unknown  . Effexor [Venlafaxine] Other (See Comments)    unknown  . Morphine And Related Other (See Comments)    Passed out   . Tape     adhesive    Outpatient Encounter Medications as of 02/17/2018  Medication Sig  . acetaminophen (TYLENOL) 325 MG tablet Take 2 tablets (650 mg total) by mouth every 6 (six) hours as needed for mild pain or moderate pain.  Marland Kitchen amLODipine (NORVASC) 10 MG tablet Take 10 mg by mouth daily.   Marland Kitchen aspirin EC 81 MG tablet Take 81 mg by mouth daily.  Marland Kitchen atorvastatin (LIPITOR) 20 MG tablet Take 20 mg by mouth daily.   . cetirizine (ZYRTEC) 10 MG tablet Take 10 mg by mouth  at bedtime as needed for allergies.  . cholecalciferol (VITAMIN D) 1000 units tablet Take 1,000 Units by mouth daily.   Marland Kitchen dextromethorphan-guaiFENesin (ROBITUSSIN-DM) 10-100 MG/5ML liquid Take 5 mLs by mouth every 4 (four) hours as needed for cough. Give 5cc by mouth every 4 hours as needed for cough.  . dextrose (GLUTOSE 15) 40 % GEL Take 1 Tube by mouth. Give 1 application by mouth as needed for blood sugar less than 60 recheck blood sugar in 15 minutes; notify MD if blood sugar doesn't increase.  . famotidine (PEPCID) 20 MG tablet Take 20 mg by mouth daily.   .  feeding supplement, ENSURE ENLIVE, (ENSURE ENLIVE) LIQD Take 237 mLs by mouth 2 (two) times daily between meals.  . fluticasone (FLONASE) 50 MCG/ACT nasal spray Place 2 sprays into both nostrils daily.  . hydrALAZINE (APRESOLINE) 25 MG tablet Take 25 mg by mouth 3 (three) times daily. Give 1 tablet by mouth three times a day for HTN hold for systolic BP <846  . insulin glargine (LANTUS) 100 UNIT/ML injection Inject 0.12 mLs (12 Units total) into the skin daily.  Marland Kitchen levothyroxine (SYNTHROID, LEVOTHROID) 50 MCG tablet Take 50 mcg by mouth daily before breakfast.   . magnesium oxide (MAG-OX) 400 MG tablet Take 400 mg by mouth daily.   . Multiple Vitamin (MULTIVITAMIN WITH MINERALS) TABS tablet Take 1 tablet by mouth daily.   . naproxen sodium (ALEVE) 220 MG tablet Take 220 mg by mouth every 12 (twelve) hours as needed.  . traZODone (DESYREL) 50 MG tablet Take 50 mg by mouth at bedtime.  Marland Kitchen trimethoprim (TRIMPEX) 100 MG tablet Take 100 mg by mouth daily. Give 1 tablet by mouth one time a day for preventative.  . Wheat Dextrin (BENEFIBER PO) Take 10 mLs by mouth daily.   . [DISCONTINUED] clotrimazole-betamethasone (LOTRISONE) cream Apply 1 application topically 2 (two) times daily.  . [DISCONTINUED] insulin aspart (NOVOLOG) 100 UNIT/ML injection Inject 2-10 Units into the skin 3 (three) times daily.   No facility-administered encounter medications on file as of 02/17/2018.      Review of Systems   This is limited secondary to dementia  In general she is not complaining of any fever or chills.  Skin she does have a rash of her abdominal folds and perineal area does not really complain of itching.  Head ears eyes nose mouth and throat is not complain of visual changes or sore throat.  Respiratory is not complaining of cough at this time or shortness of breath she does have oxygen applied.  Cardiac does not complain of chest pain does not appear to have significant lower extremity edema.  GI is  not complaining of abdominal pain.  GU does have an indwelling catheter draining amber-colored urine does not complain of dysuria.  Musculoskeletal does not complain of pain at this time.  Neurologic is not complaining of being dizzy or having a headache or syncopal.  And psych does have a history of dementia but she is pleasant and cooperative nursing has not reported any behaviors    There is no immunization history for the selected administration types on file for this patient. Pertinent  Health Maintenance Due  Topic Date Due  . INFLUENZA VACCINE  03/20/2018 (Originally 10/13/2017)  . FOOT EXAM  03/20/2018 (Originally 07/20/1937)  . HEMOGLOBIN A1C  03/20/2018 (Originally 05/15/2017)  . OPHTHALMOLOGY EXAM  03/20/2018 (Originally 07/20/1937)  . URINE MICROALBUMIN  03/20/2018 (Originally 07/20/1937)  . DEXA SCAN  03/20/2018 (Originally 07/20/1994)  .  PNA vac Low Risk Adult (1 of 2 - PCV13) 03/20/2018 (Originally 07/20/1994)   No flowsheet data found. Functional Status Survey:    Vitals:   02/17/18 1416  BP: (!) 147/59  Pulse: 65  Resp: (!) 22  Temp: (!) 96.5 F (35.8 C)  TempSrc: Oral   Manual blood pressure this afternoon was 112/50 Physical Exam   In general this is a pleasant elderly female no distress resting comfortably in bed.  Her skin is warm and dry she does have a beefy red well-circumscribed rash abdominal folds bilaterally as well as perineal area.  Eyes visual acuity appears to be intact sclera and conjunctive are clear.  Oropharynx is clear mucous membranes appear fairly moist.  Chest is clear to auscultation with somewhat poor respiratory effort shallow air entry could not really appreciate any labored breathing.  Heart is regular rate and rhythm without murmur gallop or rub she has minimal lower extremity edema.  Abdomen is soft nontender with positive bowel sounds.  GU she does have an indwelling Foley catheter draining amber-colored urine.  Musculoskeletal  Limited exam since she is in bed but is able to move all extremities x4 is able to turn herself in bed without assistance upper extremity strength appears preserved appears she might have some lower extremity weakness but difficult to do full assessment since she is in bed.  Neurologic is grossly intact her speech is clear no lateralizing findings.  Psych she is oriented to self follow simple verbal commands without difficulty is pleasant and cooperative.    Labs reviewed: Recent Labs    02/10/18 1207 02/13/18 0505 02/15/18 0543  NA 143 143 143  K 4.4 4.5 4.6  CL 112* 113* 113*  CO2 22 24 24   GLUCOSE 133* 96 98  BUN 38* 39* 23  CREATININE 1.60* 1.52* 1.22*  1.17*  CALCIUM 9.1 8.3* 8.8*   Recent Labs    02/03/18 1915 02/10/18 1207  AST 20 17  ALT 15 17  ALKPHOS 67 64  BILITOT 0.7 0.8  PROT 6.8 7.2  ALBUMIN 3.8 3.6   Recent Labs    02/06/18 0830 02/10/18 1207 02/13/18 0505  WBC 8.8 9.1 8.1  NEUTROABS 6.1 7.3 5.1  HGB 11.4* 13.0 12.1  HCT 37.6 42.7 39.7  MCV 100.5* 98.2 99.0  PLT 206 269 253   Lab Results  Component Value Date   TSH 3.997 11/20/2016   Lab Results  Component Value Date   HGBA1C 12.0 (H) 11/15/2016   Lab Results  Component Value Date   CHOL 109 11/20/2016   HDL 30 (L) 11/20/2016   LDLCALC 19 11/20/2016   TRIG 298 (H) 11/20/2016   CHOLHDL 3.6 11/20/2016    Significant Diagnostic Results in last 30 days:  No results found.  Assessment/Plan  #1 pneumonia this appears to be relatively asymptomatic she has completed therapy in the hospital including antibiotics- she continues on PRN Robitussin-she is also on oxygen at this point respiratory status appears to be stable.  2.  History of hypertension--- at this point will monitor she is on Norvasc as well as hydralazine- systolic was in the 656C this morning however I got 112 this afternoon she does not show signs of hypotension but this will have to be watched at this point continue current  medications would like to get some more readings before any medication changes.  3.  History of coronary artery disease this appears relatively asymptomatic she is on aspirin as well as a statin.  4.  History of CVA as noted above she is on aspirin and a statin.  5.  History of dementia she is pleasant and cooperative today at this point continue supportive care.  6.  History of allergic rhinitis continues on Zyrtec and Flonase this appears stable currently.  7.  History of rash is noted above in the groin and perineal areas  We will write an order for Nizoral cream daily to affected areas and monitor.  8.  History of type 2 diabetes she is on Lantus 12 units a day-this appears stable blood sugar last night was 108 this morning was 120 and at noon 154 continue to monitor.  9.  History of urinary retention-again she does have a Foley catheter at this point will keep it in but at some point would benefit from a trial course off it she also may benefit from a urology consult.  10.  History of hydronephrosis apparently this resolved in hospital creatinine is trending down at 1.22 on lab done 2 days ago will have this updated early next week she appears to be doing better in this regards.  11.-  History of pain this appears stable she does have an order for leave.  12.  History of hypothyroidism she is on Synthroid TSH in Septembert was within normal limits at 3.997.  Again will update a metabolic panel and CBC for updated values first laboratory day next week- continue to monitor but clinically she appears to be stable-at some point will attempt a trial course of the catheter.  [id number] note greater than 35 minutes spent assessing patient-reviewing her chart and labs- coordinating and formulating a plan of care for numerous diagnoses- of note greater than 50% of time spent coordinating a plan of care with input as noted above

## 2018-02-20 ENCOUNTER — Encounter (HOSPITAL_COMMUNITY)
Admission: RE | Admit: 2018-02-20 | Discharge: 2018-02-20 | Disposition: A | Discharge status: Home / Self Care | Payer: Medicare Other | Source: Skilled Nursing Facility | Attending: Internal Medicine | Admitting: Internal Medicine

## 2018-02-20 ENCOUNTER — Encounter: Payer: Self-pay | Admitting: Internal Medicine

## 2018-02-20 ENCOUNTER — Non-Acute Institutional Stay (SKILLED_NURSING_FACILITY): Payer: Medicare Other | Admitting: Internal Medicine

## 2018-02-20 DIAGNOSIS — J189 Pneumonia, unspecified organism: Secondary | ICD-10-CM

## 2018-02-20 DIAGNOSIS — R339 Retention of urine, unspecified: Secondary | ICD-10-CM

## 2018-02-20 DIAGNOSIS — E039 Hypothyroidism, unspecified: Secondary | ICD-10-CM | POA: Diagnosis not present

## 2018-02-20 DIAGNOSIS — N179 Acute kidney failure, unspecified: Secondary | ICD-10-CM | POA: Diagnosis not present

## 2018-02-20 DIAGNOSIS — E119 Type 2 diabetes mellitus without complications: Secondary | ICD-10-CM

## 2018-02-20 DIAGNOSIS — I10 Essential (primary) hypertension: Secondary | ICD-10-CM | POA: Diagnosis not present

## 2018-02-20 DIAGNOSIS — I2510 Atherosclerotic heart disease of native coronary artery without angina pectoris: Secondary | ICD-10-CM

## 2018-02-20 DIAGNOSIS — N183 Chronic kidney disease, stage 3 (moderate): Secondary | ICD-10-CM | POA: Insufficient documentation

## 2018-02-20 LAB — BASIC METABOLIC PANEL
Anion gap: 5 (ref 5–15)
BUN: 31 mg/dL — ABNORMAL HIGH (ref 8–23)
CO2: 27 mmol/L (ref 22–32)
Calcium: 9 mg/dL (ref 8.9–10.3)
Chloride: 108 mmol/L (ref 98–111)
Creatinine, Ser: 1.96 mg/dL — ABNORMAL HIGH (ref 0.44–1.00)
GFR calc Af Amer: 26 mL/min — ABNORMAL LOW (ref >60)
GFR calc non Af Amer: 22 mL/min — ABNORMAL LOW (ref >60)
Glucose, Bld: 126 mg/dL — ABNORMAL HIGH (ref 70–99)
Potassium: 4.8 mmol/L (ref 3.5–5.1)
SODIUM: 140 mmol/L (ref 135–145)

## 2018-02-20 LAB — CBC WITH DIFFERENTIAL/PLATELET
Abs Immature Granulocytes: 0.13 10*3/uL — ABNORMAL HIGH (ref 0.00–0.07)
Basophils Absolute: 0.1 10*3/uL (ref 0.0–0.1)
Basophils Relative: 1 %
Eosinophils Absolute: 0.3 10*3/uL (ref 0.0–0.5)
Eosinophils Relative: 3 %
HCT: 39.3 % (ref 36.0–46.0)
Hemoglobin: 12.2 g/dL (ref 12.0–15.0)
IMMATURE GRANULOCYTES: 1 %
Lymphocytes Relative: 19 %
Lymphs Abs: 1.8 10*3/uL (ref 0.7–4.0)
MCH: 30.7 pg (ref 26.0–34.0)
MCHC: 31 g/dL (ref 30.0–36.0)
MCV: 98.7 fL (ref 80.0–100.0)
Monocytes Absolute: 0.8 10*3/uL (ref 0.1–1.0)
Monocytes Relative: 9 %
NEUTROS PCT: 67 %
Neutro Abs: 6.5 10*3/uL (ref 1.7–7.7)
Platelets: 240 10*3/uL (ref 150–400)
RBC: 3.98 MIL/uL (ref 3.87–5.11)
RDW: 12.8 % (ref 11.5–15.5)
WBC: 9.6 10*3/uL (ref 4.0–10.5)
nRBC: 0 % (ref 0.0–0.2)

## 2018-02-20 NOTE — Progress Notes (Signed)
Provider: Veleta Miners MD  Location:    [facility] Room Number: 151/P Place of Service:  SNF (31)  PCP: Sinda Du, MD Patient Care Team: Sinda Du, MD as PCP - General (Pulmonary Disease)  Extended Emergency Contact Information Primary Emergency Contact: Knowlton,[name] Address: [address]          [place], [place] [address] [place] Phone: [phone] Mobile Phone: [phone] Relation: Daughter  Code Status: DNR Outpatient Encounter Medications as of 02/20/2018  Medication Sig  . acetaminophen (TYLENOL) 325 MG tablet Take 2 tablets (650 mg total) by mouth every 6 (six) hours as needed for mild pain or moderate pain.  Marland Kitchen amLODipine (NORVASC) 10 MG tablet Take 10 mg by mouth daily.   Marland Kitchen aspirin EC 81 MG tablet Take 81 mg by mouth daily.  Marland Kitchen atorvastatin (LIPITOR) 20 MG tablet Take 20 mg by mouth daily.   . cetirizine (ZYRTEC) 10 MG tablet Take 10 mg by mouth at bedtime as needed for allergies.  . cholecalciferol (VITAMIN D) 1000 units tablet Take 1,000 Units by mouth daily.   Marland Kitchen dextromethorphan-guaiFENesin (ROBITUSSIN-DM) 10-100 MG/5ML liquid Take 5 mLs by mouth every 4 (four) hours as needed for cough. Give 5cc by mouth every 4 hours as needed for cough.  . dextrose (GLUTOSE 15) 40 % GEL Take 1 Tube by mouth. Give 1 application by mouth as needed for blood sugar less than 60 recheck blood sugar in 15 minutes; notify MD if blood sugar doesn't increase.  . famotidine (PEPCID) 20 MG tablet Take 20 mg by mouth daily.   . feeding supplement, ENSURE ENLIVE, (ENSURE ENLIVE) LIQD Take 237 mLs by mouth 2 (two) times daily between meals.  . fluticasone (FLONASE) 50 MCG/ACT nasal spray Place 2 sprays into both nostrils daily.  . hydrALAZINE (APRESOLINE) 25 MG tablet Take 25 mg by mouth 3 (three) times daily. Give 1 tablet by mouth three times a day for HTN hold for systolic BP <330  . insulin glargine (LANTUS) 100 UNIT/ML injection Inject  0.12 mLs (12 Units total) into the skin daily.  Marland Kitchen ketoconazole (NIZORAL) 2 % cream Apply 1 application topically daily. Apply to abdominal folds under breast, groin area and sacrum area where redness and rash is located report to provider if no improvements is noted.  Marland Kitchen levothyroxine (SYNTHROID, LEVOTHROID) 50 MCG tablet Take 50 mcg by mouth daily before breakfast.   . magnesium oxide (MAG-OX) 400 MG tablet Take 400 mg by mouth daily.   . Multiple Vitamin (MULTIVITAMIN WITH MINERALS) TABS tablet Take 1 tablet by mouth daily.   . naproxen sodium (ALEVE) 220 MG tablet Take 220 mg by mouth every 12 (twelve) hours as needed.  . traZODone (DESYREL) 50 MG tablet Take 50 mg by mouth at bedtime.  Marland Kitchen trimethoprim (TRIMPEX) 100 MG tablet Take 100 mg by mouth daily. Give 1 tablet by mouth one time a day for preventative.  . Wheat Dextrin (BENEFIBER PO) Take 10 mLs by mouth daily.    No facility-administered encounter medications on file as of 02/20/2018.     Goals of Care: Advanced Directive information Advanced Directives 02/20/2018  Does Patient Have a Medical Advance Directive? Yes  Type of Advance Directive Out of facility DNR (pink MOST or yellow form)  Does patient want to make changes to medical advance directive? No - Patient declined  Copy of [place] in Chart? -  Would patient like information on creating a medical advance directive? No -  Patient declined      Chief Complaint  Patient presents with  . New Admit To SNF    New Admission Visit    HPI: Patient is a 84 y.o. female seen today for admission to SNF for therapy. Patient is resident of [facility] who has h/o Hypertension, Diabetes mellitus,Dementia, Recurrent UTI, h/o CAD,  She is unable to give me Detail history. But Per Chart she was admitted to Hospital from 11/22- 12/05 for Pneumonia and Mental status Change with Urinary Retention .  She presented to ED with fever shortness of breath and cough.  She  had bilateral infiltrates on chest x-ray and she was treated for pneumonia. She developed urinary retention requiring Foley catheter. Patient also got significantly more confused in the hospital due to the acute encephalopathy.  Eventually patient returned to her baseline and was discharged to SNF for therapy. Patient denies any coughing chest pain or fever.  She lives in [place] and walks with a walker.  She was unable to give me any further history due to her dementia.   Past Medical History:  Diagnosis Date  . Anemia   . Arthritis   . Cerebral infarction ([place])   . Coronary artery disease   . Dementia ([place])   . Diabetes mellitus without complication (HCC)    Type 2  . Dysphagia due to recent cerebral infarction   . Failure to thrive (0-17)   . Hypercalcemia   . Hypertension   . Pneumonitis   . Protein calorie malnutrition ([place])   . Subdural hematoma ([place])   . Thyroid disease    History reviewed. No pertinent surgical history.  reports that she has never smoked. She has never used smokeless tobacco. She reports that she does not drink alcohol or use drugs. Social History   Socioeconomic History  . Marital status: Married    Spouse name: Not on file  . Number of children: Not on file  . Years of education: Not on file  . Highest education level: Not on file  Occupational History  . Not on file  Social Needs  . Financial resource strain: Not hard at all  . Food insecurity:    Worry: Never true    Inability: Never true  . Transportation needs:    Medical: Patient refused    Non-medical: Patient refused  Tobacco Use  . Smoking status: Never Smoker  . Smokeless tobacco: Never Used  Substance and Sexual Activity  . Alcohol use: No  . Drug use: No  . Sexual activity: Not Currently  Lifestyle  . Physical activity:    Days per week: Patient refused    Minutes per session: Patient refused  . Stress: Not at all  Relationships  . Social connections:    Talks on phone:  Patient refused    Gets together: Patient refused    Attends religious service: Patient refused    Active member of club or organization: Patient refused    Attends meetings of clubs or organizations: Patient refused    Relationship status: Patient refused  . Intimate partner violence:    Fear of current or ex partner: Patient refused    Emotionally abused: Patient refused    Physically abused: Patient refused    Forced sexual activity: Patient refused  Other Topics Concern  . Not on file  Social History Narrative  . Not on file    Functional Status Survey:    Family History  Family history unknown: Yes    Health  Maintenance  Topic Date Due  . INFLUENZA VACCINE  03/20/2018 (Originally 10/13/2017)  . FOOT EXAM  03/20/2018 (Originally 07/20/1937)  . HEMOGLOBIN A1C  03/20/2018 (Originally 05/15/2017)  . OPHTHALMOLOGY EXAM  03/20/2018 (Originally 07/20/1937)  . URINE MICROALBUMIN  03/20/2018 (Originally 07/20/1937)  . DEXA SCAN  03/20/2018 (Originally 07/20/1994)  . TETANUS/TDAP  03/20/2018 (Originally 07/19/1946)  . PNA vac Low Risk Adult (1 of 2 - PCV13) 03/20/2018 (Originally 07/20/1994)    Allergies  Allergen Reactions  . Cymbalta [Duloxetine Hcl] Other (See Comments)    unknown  . Effexor [Venlafaxine] Other (See Comments)    unknown  . Morphine And Related Other (See Comments)    Passed out   . Tape     adhesive    Outpatient Encounter Medications as of 02/20/2018  Medication Sig  . acetaminophen (TYLENOL) 325 MG tablet Take 2 tablets (650 mg total) by mouth every 6 (six) hours as needed for mild pain or moderate pain.  Marland Kitchen amLODipine (NORVASC) 10 MG tablet Take 10 mg by mouth daily.   Marland Kitchen aspirin EC 81 MG tablet Take 81 mg by mouth daily.  Marland Kitchen atorvastatin (LIPITOR) 20 MG tablet Take 20 mg by mouth daily.   . cetirizine (ZYRTEC) 10 MG tablet Take 10 mg by mouth at bedtime as needed for allergies.  . cholecalciferol (VITAMIN D) 1000 units tablet Take 1,000 Units by mouth daily.   Marland Kitchen  dextromethorphan-guaiFENesin (ROBITUSSIN-DM) 10-100 MG/5ML liquid Take 5 mLs by mouth every 4 (four) hours as needed for cough. Give 5cc by mouth every 4 hours as needed for cough.  . dextrose (GLUTOSE 15) 40 % GEL Take 1 Tube by mouth. Give 1 application by mouth as needed for blood sugar less than 60 recheck blood sugar in 15 minutes; notify MD if blood sugar doesn't increase.  . famotidine (PEPCID) 20 MG tablet Take 20 mg by mouth daily.   . feeding supplement, ENSURE ENLIVE, (ENSURE ENLIVE) LIQD Take 237 mLs by mouth 2 (two) times daily between meals.  . fluticasone (FLONASE) 50 MCG/ACT nasal spray Place 2 sprays into both nostrils daily.  . hydrALAZINE (APRESOLINE) 25 MG tablet Take 25 mg by mouth 3 (three) times daily. Give 1 tablet by mouth three times a day for HTN hold for systolic BP <063  . insulin glargine (LANTUS) 100 UNIT/ML injection Inject 0.12 mLs (12 Units total) into the skin daily.  Marland Kitchen ketoconazole (NIZORAL) 2 % cream Apply 1 application topically daily. Apply to abdominal folds under breast, groin area and sacrum area where redness and rash is located report to provider if no improvements is noted.  Marland Kitchen levothyroxine (SYNTHROID, LEVOTHROID) 50 MCG tablet Take 50 mcg by mouth daily before breakfast.   . magnesium oxide (MAG-OX) 400 MG tablet Take 400 mg by mouth daily.   . Multiple Vitamin (MULTIVITAMIN WITH MINERALS) TABS tablet Take 1 tablet by mouth daily.   . naproxen sodium (ALEVE) 220 MG tablet Take 220 mg by mouth every 12 (twelve) hours as needed.  . traZODone (DESYREL) 50 MG tablet Take 50 mg by mouth at bedtime.  Marland Kitchen trimethoprim (TRIMPEX) 100 MG tablet Take 100 mg by mouth daily. Give 1 tablet by mouth one time a day for preventative.  . Wheat Dextrin (BENEFIBER PO) Take 10 mLs by mouth daily.    No facility-administered encounter medications on file as of 02/20/2018.      Review of Systems  Review of Systems  Constitutional: Negative for activity change, appetite  change, chills, diaphoresis, fatigue  and fever.  HENT: Negative for mouth sores, postnasal drip, rhinorrhea, sinus pain and sore throat.   Respiratory: Negative for apnea, cough, chest tightness, shortness of breath and wheezing.   Cardiovascular: Negative for chest pain, palpitations and leg swelling.  Gastrointestinal: Negative for abdominal distention, abdominal pain, constipation, diarrhea, nausea and vomiting.  Genitourinary: Negative for dysuria and frequency.  Musculoskeletal: Negative for arthralgias, joint swelling and myalgias.  Skin: Negative for rash.  Neurological: Negative for dizziness, syncope, weakness, light-headedness and numbness.  Psychiatric/Behavioral: Negative for behavioral problems, confusion and sleep disturbance.     Vitals:   02/20/18 1057  BP: 126/62  Pulse: (!) 52  Resp: 20  Temp: 97.8 F (36.6 C)  TempSrc: Oral   There is no height or weight on file to calculate BMI. Physical Exam  Constitutional: She appears well-developed and well-nourished.  HENT:  Head: Normocephalic.  Mouth/Throat: Oropharynx is clear and moist.  Eyes: Pupils are equal, round, and reactive to light.  Neck: Neck supple.  Cardiovascular: Normal rate and regular rhythm.  No murmur heard. Pulmonary/Chest: Effort normal. She has no wheezes. She has no rales.  Decreased Breadth Sounds Bilateral   Genitourinary:  Genitourinary Comments: Has foley Catheter  Musculoskeletal:  Mild Edema Bilateral  Neurological: She is alert.  Knew DOB and Age. Confused about the Place and Time No Focal Deficits.  Skin: Skin is warm and dry.  Psychiatric: She has a normal mood and affect. Her behavior is normal. Thought content normal.    Labs reviewed: Basic Metabolic Panel: Recent Labs    02/13/18 0505 02/15/18 0543 02/20/18 0530  NA 143 143 140  K 4.5 4.6 4.8  CL 113* 113* 108  CO2 24 24 27   GLUCOSE 96 98 126*  BUN 39* 23 31*  CREATININE 1.52* 1.22*  1.17* 1.96*  CALCIUM 8.3*  8.8* 9.0   Liver Function Tests: Recent Labs    02/03/18 1915 02/10/18 1207  AST 20 17  ALT 15 17  ALKPHOS 67 64  BILITOT 0.7 0.8  PROT 6.8 7.2  ALBUMIN 3.8 3.6   No results for input(s): LIPASE, AMYLASE in the last 8760 hours. Recent Labs    02/10/18 1414  AMMONIA <9*   CBC: Recent Labs    02/10/18 1207 02/13/18 0505 02/20/18 0530  WBC 9.1 8.1 9.6  NEUTROABS 7.3 5.1 6.5  HGB 13.0 12.1 12.2  HCT 42.7 39.7 39.3  MCV 98.2 99.0 98.7  PLT 269 253 240   Cardiac Enzymes: Recent Labs    02/03/18 1915  TROPONINI <0.03   BNP: Invalid input(s): POCBNP Lab Results  Component Value Date   HGBA1C 12.0 (H) 11/15/2016   Lab Results  Component Value Date   TSH 3.997 11/20/2016   No results found for: VITAMINB12 No results found for: FOLATE No results found for: IRON, TIBC, FERRITIN  Imaging and Procedures obtained prior to SNF admission: No results found.  Assessment/Plan  HCAP (healthcare-associated pneumonia) Finished Antibiotics in Hospital On Cetrizine and Flonase Taper off the oxygen   Essential hypertension BP Controlled on Norvasc And Hydralazine Diabetes mellitus  Repeat A1C  BS mostly Less then 200 Continue on Lantus CAD On aspirin and Statin Hypothyroidism, On Synthroid Will check TSH  CKD Will Follow Creat. Close to baseline [place] in Hospital was negative  Urinary retention D/W the Nurses Will start her on Flomax for 7 Days Voiding Trial Also on Trimethoprim For Recurrent UTI      Family/ staff Communication:   Labs/tests ordered:  Total  time spent in this patient care encounter was 45_ minutes; greater than 50% of the visit spent counseling patient, reviewing records , Labs and coordinating care for problems addressed at this encounter.

## 2018-02-27 ENCOUNTER — Non-Acute Institutional Stay (SKILLED_NURSING_FACILITY): Payer: Medicare Other | Admitting: Internal Medicine

## 2018-02-27 ENCOUNTER — Encounter: Payer: Self-pay | Admitting: Internal Medicine

## 2018-02-27 ENCOUNTER — Encounter (HOSPITAL_COMMUNITY)
Admission: RE | Admit: 2018-02-27 | Discharge: 2018-02-27 | Disposition: A | Discharge status: Home / Self Care | Payer: Medicare Other | Source: Skilled Nursing Facility | Attending: Internal Medicine | Admitting: Internal Medicine

## 2018-02-27 DIAGNOSIS — R339 Retention of urine, unspecified: Secondary | ICD-10-CM | POA: Diagnosis not present

## 2018-02-27 DIAGNOSIS — E119 Type 2 diabetes mellitus without complications: Secondary | ICD-10-CM | POA: Diagnosis not present

## 2018-02-27 DIAGNOSIS — N183 Chronic kidney disease, stage 3 (moderate): Secondary | ICD-10-CM | POA: Insufficient documentation

## 2018-02-27 DIAGNOSIS — E039 Hypothyroidism, unspecified: Secondary | ICD-10-CM

## 2018-02-27 DIAGNOSIS — M1990 Unspecified osteoarthritis, unspecified site: Secondary | ICD-10-CM | POA: Insufficient documentation

## 2018-02-27 DIAGNOSIS — M6281 Muscle weakness (generalized): Secondary | ICD-10-CM | POA: Insufficient documentation

## 2018-02-27 DIAGNOSIS — J189 Pneumonia, unspecified organism: Secondary | ICD-10-CM | POA: Insufficient documentation

## 2018-02-27 LAB — CBC
HCT: 38.6 % (ref 36.0–46.0)
HEMOGLOBIN: 11.9 g/dL — AB (ref 12.0–15.0)
MCH: 29.9 pg (ref 26.0–34.0)
MCHC: 30.8 g/dL (ref 30.0–36.0)
MCV: 97 fL (ref 80.0–100.0)
Platelets: 199 10*3/uL (ref 150–400)
RBC: 3.98 MIL/uL (ref 3.87–5.11)
RDW: 12.7 % (ref 11.5–15.5)
WBC: 7.8 10*3/uL (ref 4.0–10.5)
nRBC: 0 % (ref 0.0–0.2)

## 2018-02-27 LAB — BASIC METABOLIC PANEL
Anion gap: 6 (ref 5–15)
BUN: 30 mg/dL — ABNORMAL HIGH (ref 8–23)
CO2: 25 mmol/L (ref 22–32)
Calcium: 9 mg/dL (ref 8.9–10.3)
Chloride: 107 mmol/L (ref 98–111)
Creatinine, Ser: 1.36 mg/dL — ABNORMAL HIGH (ref 0.44–1.00)
GFR calc Af Amer: 40 mL/min — ABNORMAL LOW (ref >60)
GFR calc non Af Amer: 35 mL/min — ABNORMAL LOW (ref >60)
Glucose, Bld: 131 mg/dL — ABNORMAL HIGH (ref 70–99)
Potassium: 4.3 mmol/L (ref 3.5–5.1)
Sodium: 138 mmol/L (ref 135–145)

## 2018-02-27 LAB — HEMOGLOBIN A1C
Hgb A1c MFr Bld: 7.7 % — ABNORMAL HIGH (ref 4.8–5.6)
Mean Plasma Glucose: 174.29 mg/dL

## 2018-02-27 LAB — TSH: TSH: 6.791 u[IU]/mL — ABNORMAL HIGH (ref 0.350–4.500)

## 2018-02-27 NOTE — Progress Notes (Signed)
Location:    [facility] Room Number: 151/P Place of Service:  SNF [phone]) Provider:  Veleta Miners MD  Sinda Du, MD  Patient Care Team: Sinda Du, MD as PCP - General (Pulmonary Disease)  Extended Emergency Contact Information Primary Emergency Contact: Knowlton,[name] Address: [address]          [place], [place] [address] Johnnette Litter of [place] Phone: [phone] Mobile Phone: [phone] Relation: Daughter  Code Status:  DNR Goals of care: Advanced Directive information Advanced Directives 02/27/2018  Does Patient Have a Medical Advance Directive? -  Type of Advance Directive Out of facility DNR (pink MOST or yellow form)  Does patient want to make changes to medical advance directive? No - Patient declined  Copy of [place] in Chart? -  Would patient like information on creating a medical advance directive? No - Patient declined     Chief Complaint  Patient presents with  . Acute Visit    Urinary Retention    HPI:  Pt is a 84 y.o. female seen today for an acute visit for urinary Retention  Patient is resident of [facility] who has h/o Hypertension, Diabetes mellitus,Dementia, Recurrent UTI, h/o CAD,  She was admitted to Hospital from 11/22- 12/05 for Pneumonia and Mental status Change with Urinary Retention . She presented to ED with fever shortness of breath and cough.  She had bilateral infiltrates on chest x-ray and she was treated for pneumonia. She developed urinary retention requiring Foley catheter. In the facility Voiding Trial was started. She was also started on Flomax. She did good for few days but over the weekend patient had lower abdominal pain and on placement of Foley she had 900 cc of urine. Patient does not have any complains of Dysuria, Fever  She is working with therapy. Eating good and adjusting to the facility.  Past Medical History:  Diagnosis Date  . Anemia   . Arthritis   .  Cerebral infarction ([place])   . Coronary artery disease   . Dementia ([place])   . Diabetes mellitus without complication (HCC)    Type 2  . Dysphagia due to recent cerebral infarction   . Failure to thrive (0-17)   . Hypercalcemia   . Hypertension   . Pneumonitis   . Protein calorie malnutrition ([place])   . Subdural hematoma ([place])   . Thyroid disease    History reviewed. No pertinent surgical history.  Allergies  Allergen Reactions  . Cymbalta [Duloxetine Hcl] Other (See Comments)    unknown  . Effexor [Venlafaxine] Other (See Comments)    unknown  . Morphine And Related Other (See Comments)    Passed out   . Tape     adhesive    Outpatient Encounter Medications as of 02/27/2018  Medication Sig  . acetaminophen (TYLENOL) 325 MG tablet Take 2 tablets (650 mg total) by mouth every 6 (six) hours as needed for mild pain or moderate pain.  Marland Kitchen amLODipine (NORVASC) 10 MG tablet Take 10 mg by mouth daily.   Marland Kitchen aspirin EC 81 MG tablet Take 81 mg by mouth daily.  Marland Kitchen atorvastatin (LIPITOR) 20 MG tablet Take 20 mg by mouth daily.   . cetirizine (ZYRTEC) 10 MG tablet Take 10 mg by mouth at bedtime as needed for allergies.  . cholecalciferol (VITAMIN D) 1000 units tablet Take 1,000 Units by mouth daily.   Marland Kitchen dextromethorphan-guaiFENesin (ROBITUSSIN-DM) 10-100 MG/5ML liquid Take 5 mLs by mouth every 4 (four) hours  as needed for cough. Give 5cc by mouth every 4 hours as needed for cough.  . dextrose (GLUTOSE 15) 40 % GEL Take 1 Tube by mouth. Give 1 application by mouth as needed for blood sugar less than 60 recheck blood sugar in 15 minutes; notify MD if blood sugar doesn't increase.  . famotidine (PEPCID) 20 MG tablet Take 20 mg by mouth daily.   . feeding supplement, ENSURE ENLIVE, (ENSURE ENLIVE) LIQD Take 237 mLs by mouth 2 (two) times daily between meals.  . fluticasone (FLONASE) 50 MCG/ACT nasal spray Place 2 sprays into both nostrils daily.  . hydrALAZINE (APRESOLINE) 25 MG tablet Take 25 mg by  mouth 3 (three) times daily. Give 1 tablet by mouth three times a day for HTN hold for systolic BP <195  . insulin glargine (LANTUS) 100 UNIT/ML injection Inject 0.12 mLs (12 Units total) into the skin daily.  Marland Kitchen ketoconazole (NIZORAL) 2 % cream Apply 1 application topically daily. Apply to abdominal folds under breast, groin area and sacrum area where redness and rash is located report to provider if no improvements is noted.  Marland Kitchen levothyroxine (SYNTHROID, LEVOTHROID) 50 MCG tablet Take 50 mcg by mouth daily before breakfast.   . magnesium oxide (MAG-OX) 400 MG tablet Take 400 mg by mouth daily.   . Multiple Vitamin (MULTIVITAMIN WITH MINERALS) TABS tablet Take 1 tablet by mouth daily.   . tamsulosin (FLOMAX) 0.4 MG CAPS capsule Take 0.4 mg by mouth every evening.  . traZODone (DESYREL) 50 MG tablet Take 50 mg by mouth at bedtime.  Marland Kitchen trimethoprim (TRIMPEX) 100 MG tablet Take 100 mg by mouth daily. Give 1 tablet by mouth one time a day for preventative.  . Wheat Dextrin (BENEFIBER PO) Take 10 mLs by mouth daily.   . [DISCONTINUED] naproxen sodium (ALEVE) 220 MG tablet Take 220 mg by mouth every 12 (twelve) hours as needed.   No facility-administered encounter medications on file as of 02/27/2018.      Review of Systems  Unable to perform ROS: Dementia    There is no immunization history for the selected administration types on file for this patient. Pertinent  Health Maintenance Due  Topic Date Due  . INFLUENZA VACCINE  03/20/2018 (Originally 10/13/2017)  . FOOT EXAM  03/20/2018 (Originally 07/20/1937)  . HEMOGLOBIN A1C  03/20/2018 (Originally 05/15/2017)  . OPHTHALMOLOGY EXAM  03/20/2018 (Originally 07/20/1937)  . URINE MICROALBUMIN  03/20/2018 (Originally 07/20/1937)  . DEXA SCAN  03/20/2018 (Originally 07/20/1994)  . PNA vac Low Risk Adult (1 of 2 - PCV13) 03/20/2018 (Originally 07/20/1994)   No flowsheet data found. Functional Status Survey:    Vitals:   02/27/18 1029  BP: 138/65  Pulse: 70    Resp: 16  Temp: 97.7 F (36.5 C)  TempSrc: Oral   There is no height or weight on file to calculate BMI. Physical Exam Constitutional:      Appearance: She is well-developed.  HENT:     Head: Normocephalic.  Eyes:     Pupils: Pupils are equal, round, and reactive to light.  Neck:     Musculoskeletal: Neck supple.  Cardiovascular:     Rate and Rhythm: Normal rate and regular rhythm.     Heart sounds: No murmur.  Pulmonary:     Effort: Pulmonary effort is normal.     Breath sounds: No wheezing or rales.  Genitourinary:    Comments: Has foley Catheter Musculoskeletal:     Comments: Mild Edema Bilateral  Skin:  General: Skin is warm and dry.  Neurological:     Mental Status: She is alert.     Comments: Knew DOB and Age. Confused about the Place and Time No Focal Deficits.  Psychiatric:        Behavior: Behavior normal.        Thought Content: Thought content normal.     Labs reviewed: Recent Labs    02/15/18 0543 02/20/18 0530 02/27/18 0700  NA 143 140 138  K 4.6 4.8 4.3  CL 113* 108 107  CO2 24 27 25   GLUCOSE 98 126* 131*  BUN 23 31* 30*  CREATININE 1.22*  1.17* 1.96* 1.36*  CALCIUM 8.8* 9.0 9.0   Recent Labs    02/03/18 1915 02/10/18 1207  AST 20 17  ALT 15 17  ALKPHOS 67 64  BILITOT 0.7 0.8  PROT 6.8 7.2  ALBUMIN 3.8 3.6   Recent Labs    02/10/18 1207 02/13/18 0505 02/20/18 0530 02/27/18 0700  WBC 9.1 8.1 9.6 7.8  NEUTROABS 7.3 5.1 6.5  --   HGB 13.0 12.1 12.2 11.9*  HCT 42.7 39.7 39.3 38.6  MCV 98.2 99.0 98.7 97.0  PLT 269 253 240 199   Lab Results  Component Value Date   TSH 6.791 (H) 02/27/2018   Lab Results  Component Value Date   HGBA1C 12.0 (H) 11/15/2016   Lab Results  Component Value Date   CHOL 109 11/20/2016   HDL 30 (L) 11/20/2016   LDLCALC 19 11/20/2016   TRIG 298 (H) 11/20/2016   CHOLHDL 3.6 11/20/2016    Significant Diagnostic Results in last 30 days:  Ct Abdomen Pelvis Wo Contrast  Result Date:  02/11/2018 CLINICAL DATA:  Abdominal pain. EXAM: CT ABDOMEN AND PELVIS WITHOUT CONTRAST TECHNIQUE: Multidetector CT imaging of the abdomen and pelvis was performed following the standard protocol without IV contrast. COMPARISON:  None FINDINGS: Lower chest: No acute abnormality. Hepatobiliary: No focal liver abnormality is seen. No gallstones, gallbladder wall thickening, or biliary dilatation. Pancreas: Unremarkable. No pancreatic ductal dilatation or surrounding inflammatory changes. Spleen: Normal in size without focal abnormality. Adrenals/Urinary Tract: Small calcifications are identified within both adrenal glands. No discrete mass identified. Bilateral kidney cysts are noted which are incompletely characterized without IV contrast material. Dominant cyst arises from the medial cortex of the inferior pole of right kidney measuring 5.2 cm. Hyperdense lesion arising from the upper pole of the left kidney measures 2.1 cm and 35 HU. This may represent a hemorrhagic cyst or solid kidney lesion. Bilateral pelvocaliectasis is identified along with hydroureter. The urinary bladder is markedly distended measuring 12.5 by 12.8 by 15.1 cm (volume = 1270 cm^3). Stomach/Bowel: Stomach appears nondistended. There is no small or large bowel dilatation. Within the limitations of unenhanced technique there is no bowel wall thickening or inflammation noted. There is a large ventral abdominal wall hernia measuring 12.6 cm and contains nonobstructed loops of large and small bowel. The appendix is visualized and appears normal. Vascular/Lymphatic: Aortic atherosclerosis. No aneurysm. No enlarged abdominal or pelvic lymph nodes. Reproductive: Uterus and bilateral adnexa are unremarkable. Other: No free fluid or fluid collections within the abdomen or pelvis. Musculoskeletal: No acute or significant osseous findings. IMPRESSION: 1. Marked distension of the urinary bladder with bilateral pelvocaliectasis and hydroureter. The bladder  as a volume of 1270 mL. 2. Large ventral abdominal wall hernia contains nonobstructed loops of small and large bowel. 3. Bilateral kidney lesions of varying density are incompletely characterized without IV contrast material. This includes  a hyperdense lesion arising from the upper pole of left kidney which may represent a solid kidney lesion or hemorrhagic cysts. 4.  Aortic Atherosclerosis (ICD10-I70.0). Electronically Signed   By: Kerby Moors M.D.   On: 02/11/2018 14:35   Dg Abd 1 View  Result Date: 02/10/2018 CLINICAL DATA:  Abdominal pain. EXAM: ABDOMEN - 1 VIEW COMPARISON:  Ultrasound 11/16/2016.  05/03/2016. FINDINGS: Surgical clips noted over the abdomen. Soft tissue structures are unremarkable. No bowel distention. No free air. Aortoiliac atherosclerotic vascular calcification. Degenerative change lumbar spine and both hips IMPRESSION: No acute abnormality. Electronically Signed   By: Marcello Moores  Register   On: 02/10/2018 11:44   US Renal  Result Date: 02/15/2018 CLINICAL DATA:  Urinary retention. EXAM: RENAL / URINARY TRACT ULTRASOUND COMPLETE COMPARISON:  CT scan February 11, 2018 FINDINGS: Right Kidney: Renal measurements: 10.2 x 5.2 x 6.4 cm = volume: 175.5 mL. Contains a 2.1 cm cyst. No hydronephrosis. Left Kidney: Renal measurements: 12.7 x 4.9 x 4.7 cm = volume: 152.4 mL. Contains 2 cysts with the largest measuring 2.9 cm. Bladder: Decompressed and not well evaluated. IMPRESSION: 1. Renal cysts.  No hydronephrosis. 2. The bladder is decompressed and not well evaluated. Electronically Signed   By: Dorise Bullion III M.D   On: 02/15/2018 14:15   Dg Chest Port 1 View  Result Date: 02/06/2018 CLINICAL DATA:  Pneumonia, shortness of breath, fever EXAM: PORTABLE CHEST 1 VIEW COMPARISON:  02/03/2018 FINDINGS: Cardiomegaly with vascular congestion and mild bilateral interstitial and alveolar opacities, similar prior study most compatible with CHF. Possible small effusions. No acute bony  abnormality. Low lung volumes. IMPRESSION: Continued mild CHF, unchanged. Question small effusions. Electronically Signed   By: Rolm Baptise M.D.   On: 02/06/2018 10:14   Dg Chest Portable 1 View  Result Date: 02/03/2018 CLINICAL DATA:  Did dyspnea EXAM: PORTABLE CHEST 1 VIEW COMPARISON:  11/15/2016 FINDINGS: Cardiomegaly with aortic atherosclerosis. Diffuse bilateral pulmonary vascular redistribution and engorgement consistent with mild CHF. Definite effusion or pneumothorax. Osteoarthritis of the included shoulders. IMPRESSION: Cardiomegaly with mild CHF. Nonaneurysmal atherosclerotic aorta with uncoiling. Electronically Signed   By: Ashley Royalty M.D.   On: 02/03/2018 19:48    Assessment/Plan Urinary retention D/W DR Karie Kirks who is his PCP and Son in law We will try another Voiding Trial in few days. Also continue on Flomax Have made appointment with Urology. HCAP (healthcare-associated pneumonia) Finished Antibiotics in Hospital On Cetrizine and Flonase  off the oxygen   Essential hypertension BP Controlled on Norvasc And Hydralazine Diabetes mellitus  Repeat A1C was 7.7 BS mostly Less then 200 But peaks in Evening Continue on Lantus CAD On aspirin and Statin Hypothyroidism, On Synthroid TSH mildly high  Needs follow up as outpatient  CKD Close to baseline [place] in Hospital was negative       Family/ staff Communication:   Labs/tests ordered:

## 2018-03-09 ENCOUNTER — Non-Acute Institutional Stay (SKILLED_NURSING_FACILITY): Payer: Medicare Other | Admitting: Internal Medicine

## 2018-03-09 ENCOUNTER — Encounter: Payer: Self-pay | Admitting: Internal Medicine

## 2018-03-09 DIAGNOSIS — N179 Acute kidney failure, unspecified: Secondary | ICD-10-CM | POA: Diagnosis not present

## 2018-03-09 DIAGNOSIS — R339 Retention of urine, unspecified: Secondary | ICD-10-CM

## 2018-03-09 DIAGNOSIS — E119 Type 2 diabetes mellitus without complications: Secondary | ICD-10-CM | POA: Diagnosis not present

## 2018-03-09 DIAGNOSIS — D3000 Benign neoplasm of unspecified kidney: Secondary | ICD-10-CM | POA: Diagnosis not present

## 2018-03-09 DIAGNOSIS — I10 Essential (primary) hypertension: Secondary | ICD-10-CM | POA: Diagnosis not present

## 2018-03-09 NOTE — Progress Notes (Signed)
Location:    [facility] Room Number: 151/P Place of Service:  SNF [phone]) Provider:  Terisa Starr, MD  Patient Care Team: Sinda Du, MD as PCP - General (Pulmonary Disease)  Extended Emergency Contact Information Primary Emergency Contact: Knowlton,[name] Address: [address]          [place], [place] [address] Johnnette Litter of [place] Phone: [phone] Mobile Phone: [phone] Relation: Daughter  Code Status:  DNR Goals of care: Advanced Directive information Advanced Directives 03/09/2018  Does Patient Have a Medical Advance Directive? Yes  Type of Advance Directive Out of facility DNR (pink MOST or yellow form)  Does patient want to make changes to medical advance directive? No - Patient declined  Copy of [place] in Chart? -  Would patient like information on creating a medical advance directive? No - Patient declined     Chief Complaint  Patient presents with  . Acute Visit    F/U Visit  For multiple medical issues.    HPI:  Pt is a 84 y.o. female seen today for an acute visit for follow-up of various medical issues.  Was admitted to the hospital in late November early December for pneumonia mental status changes with urinary retention.  He did require a Foley catheter for the urinary retention. She was discharged to this facility had a voiding trial  she was also started on Flomax she did well for a while but then had lower abdominal pain and with-placement of the Foley had a 900 cc output of urine  She actually saw urology today and they did recommend a scale where if they are less than 350 cc of urinary retention to monitor-however if urinary retention is from 350 up to 500 nightly in and out catheter.  However if there is more than 500 cc of retention to put in a Foley again here he appears to be in good spirits.  Her other medical issues appear relatively stable she is finished her  antibiotics for pneumonia she is on Zyrtec and Flonase.  Blood pressure appears really well controlled on Norvasc and hydralazine got a manual blood pressure of 142/76 previous blood pressures 138/77-131/63.  In regards to type 2 diabetes her A1c in the hospital was 7.7 blood sugars mostly in the mid 100s in the morning at at bedtime often 200 readings but it was 154 a couple nights ago- secondary to her advanced age and comorbidities have been somewhat conservative here graft currently she is sitting in her chair comfortably has no complaints   apparently when she saw urology she was noted to still have a groin rash and she does have orders for Diflucan for this   Past Medical History:  Diagnosis Date  . Anemia   . Arthritis   . Cerebral infarction ([place])   . Coronary artery disease   . Dementia ([place])   . Diabetes mellitus without complication (HCC)    Type 2  . Dysphagia due to recent cerebral infarction   . Failure to thrive (0-17)   . Hypercalcemia   . Hypertension   . Pneumonitis   . Protein calorie malnutrition ([place])   . Subdural hematoma ([place])   . Thyroid disease    History reviewed. No pertinent surgical history.  Allergies  Allergen Reactions  . Cymbalta [Duloxetine Hcl] Other (See Comments)    unknown  . Effexor [Venlafaxine] Other (See Comments)    unknown  . Morphine And Related Other (See  Comments)    Passed out   . Tape     adhesive    Outpatient Encounter Medications as of 03/09/2018  Medication Sig  . acetaminophen (TYLENOL) 325 MG tablet Take 2 tablets (650 mg total) by mouth every 6 (six) hours as needed for mild pain or moderate pain.  Marland Kitchen amLODipine (NORVASC) 10 MG tablet Take 10 mg by mouth daily.   Marland Kitchen aspirin EC 81 MG tablet Take 81 mg by mouth daily.  Marland Kitchen atorvastatin (LIPITOR) 20 MG tablet Take 20 mg by mouth daily.   . cetirizine (ZYRTEC) 10 MG tablet Take 10 mg by mouth at bedtime as needed for allergies.  . cholecalciferol (VITAMIN D) 1000 units  tablet Take 1,000 Units by mouth daily.   . clotrimazole-betamethasone (LOTRISONE) cream Apply 1 application topically 2 (two) times daily. Apply 2 grams to affected areas daily until resolved  . dextromethorphan-guaiFENesin (ROBITUSSIN-DM) 10-100 MG/5ML liquid Take 5 mLs by mouth every 4 (four) hours as needed for cough. Give 5cc by mouth every 4 hours as needed for cough.  . dextrose (GLUTOSE 15) 40 % GEL Take 1 Tube by mouth. Give 1 application by mouth as needed for blood sugar less than 60 recheck blood sugar in 15 minutes; notify MD if blood sugar doesn't increase.  . famotidine (PEPCID) 20 MG tablet Take 20 mg by mouth daily.   . feeding supplement, ENSURE ENLIVE, (ENSURE ENLIVE) LIQD Take 237 mLs by mouth 2 (two) times daily between meals.  . fluconazole (DIFLUCAN) 200 MG tablet Take 200 mg by mouth daily. For 5 days from 03/10/2018-03/14/2018  . fluticasone (FLONASE) 50 MCG/ACT nasal spray Place 2 sprays into both nostrils daily.  . hydrALAZINE (APRESOLINE) 25 MG tablet Take 25 mg by mouth 3 (three) times daily. Give 1 tablet by mouth three times a day for HTN hold for systolic BP <106  . insulin glargine (LANTUS) 100 UNIT/ML injection Inject 0.12 mLs (12 Units total) into the skin daily.  Marland Kitchen ketoconazole (NIZORAL) 2 % cream Apply 1 application topically daily. Apply to abdominal folds under breast, groin area and sacrum area where redness and rash is located report to provider if no improvements is noted.  Marland Kitchen levothyroxine (SYNTHROID, LEVOTHROID) 50 MCG tablet Take 50 mcg by mouth daily before breakfast.   . magnesium oxide (MAG-OX) 400 MG tablet Take 400 mg by mouth daily.   . Multiple Vitamin (MULTIVITAMIN WITH MINERALS) TABS tablet Take 1 tablet by mouth daily.   . tamsulosin (FLOMAX) 0.4 MG CAPS capsule Take 0.4 mg by mouth every evening.  . traZODone (DESYREL) 50 MG tablet Take 50 mg by mouth at bedtime.  Marland Kitchen trimethoprim (TRIMPEX) 100 MG tablet Take 100 mg by mouth daily. Give 1 tablet by  mouth one time a day for preventative.  . Wheat Dextrin (BENEFIBER PO) Take 10 mLs by mouth daily.    No facility-administered encounter medications on file as of 03/09/2018.     Review of Systems   This is limited secondary to patient being somewhat of a poor historian but she is in good spirits and does not really complain of any pain or dysuria no chest pain or shortness of breath at this time.    There is no immunization history for the selected administration types on file for this patient. Pertinent  Health Maintenance Due  Topic Date Due  . INFLUENZA VACCINE  03/20/2018 (Originally 10/13/2017)  . FOOT EXAM  03/20/2018 (Originally 07/20/1937)  . OPHTHALMOLOGY EXAM  03/20/2018 (Originally 07/20/1937)  .  URINE MICROALBUMIN  03/20/2018 (Originally 07/20/1937)  . DEXA SCAN  03/20/2018 (Originally 07/20/1994)  . PNA vac Low Risk Adult (1 of 2 - PCV13) 03/20/2018 (Originally 07/20/1994)  . HEMOGLOBIN A1C  08/29/2018   No flowsheet data found. Functional Status Survey:    Vitals:   03/09/18 1450  BP: 138/77  Pulse: 66  Resp: 20  Temp: 97.8 F (36.6 C)  TempSrc: Oral    Physical Exam  In general this is a very pleasant elderly female no distress she appears to be in good spirits.  Her skin is warm and dry.  Eyes visual acuity appears to be intact sclera and conjunctive are clear.  Oropharynx is clear mucous membranes moist.  Chest is clear to auscultation there is no labored breathing.  Heart is regular rate and rhythm with an occasional irregular beat.  She has continued baseline mild lower extremity edema.  Abdomen is soft nontender with positive bowel sounds it is obese.   GU could not really appreciate suprapubic tenderness could not really evaluate rash because of patient positioning the area was covered.   Musculoskeletal is able to move all extremities x4-could not really appreciate any lateralizing findings.  Neurologic is grossly intact her speech is  clear.  Psych she is oriented to self is pleasant appropriate can carry on a short conversation-was able to tell me she went out for Christmas yesterday.    Labs reviewed: Recent Labs    02/15/18 0543 02/20/18 0530 02/27/18 0700  NA 143 140 138  K 4.6 4.8 4.3  CL 113* 108 107  CO2 24 27 25   GLUCOSE 98 126* 131*  BUN 23 31* 30*  CREATININE 1.22*  1.17* 1.96* 1.36*  CALCIUM 8.8* 9.0 9.0   Recent Labs    02/03/18 1915 02/10/18 1207  AST 20 17  ALT 15 17  ALKPHOS 67 64  BILITOT 0.7 0.8  PROT 6.8 7.2  ALBUMIN 3.8 3.6   Recent Labs    02/10/18 1207 02/13/18 0505 02/20/18 0530 02/27/18 0700  WBC 9.1 8.1 9.6 7.8  NEUTROABS 7.3 5.1 6.5  --   HGB 13.0 12.1 12.2 11.9*  HCT 42.7 39.7 39.3 38.6  MCV 98.2 99.0 98.7 97.0  PLT 269 253 240 199   Lab Results  Component Value Date   TSH 6.791 (H) 02/27/2018   Lab Results  Component Value Date   HGBA1C 7.7 (H) 02/27/2018   Lab Results  Component Value Date   CHOL 109 11/20/2016   HDL 30 (L) 11/20/2016   LDLCALC 19 11/20/2016   TRIG 298 (H) 11/20/2016   CHOLHDL 3.6 11/20/2016    Significant Diagnostic Results in last 30 days:  Ct Abdomen Pelvis Wo Contrast  Result Date: 02/11/2018 CLINICAL DATA:  Abdominal pain. EXAM: CT ABDOMEN AND PELVIS WITHOUT CONTRAST TECHNIQUE: Multidetector CT imaging of the abdomen and pelvis was performed following the standard protocol without IV contrast. COMPARISON:  None FINDINGS: Lower chest: No acute abnormality. Hepatobiliary: No focal liver abnormality is seen. No gallstones, gallbladder wall thickening, or biliary dilatation. Pancreas: Unremarkable. No pancreatic ductal dilatation or surrounding inflammatory changes. Spleen: Normal in size without focal abnormality. Adrenals/Urinary Tract: Small calcifications are identified within both adrenal glands. No discrete mass identified. Bilateral kidney cysts are noted which are incompletely characterized without IV contrast material.  Dominant cyst arises from the medial cortex of the inferior pole of right kidney measuring 5.2 cm. Hyperdense lesion arising from the upper pole of the left kidney measures 2.1 cm and 35  HU. This may represent a hemorrhagic cyst or solid kidney lesion. Bilateral pelvocaliectasis is identified along with hydroureter. The urinary bladder is markedly distended measuring 12.5 by 12.8 by 15.1 cm (volume = 1270 cm^3). Stomach/Bowel: Stomach appears nondistended. There is no small or large bowel dilatation. Within the limitations of unenhanced technique there is no bowel wall thickening or inflammation noted. There is a large ventral abdominal wall hernia measuring 12.6 cm and contains nonobstructed loops of large and small bowel. The appendix is visualized and appears normal. Vascular/Lymphatic: Aortic atherosclerosis. No aneurysm. No enlarged abdominal or pelvic lymph nodes. Reproductive: Uterus and bilateral adnexa are unremarkable. Other: No free fluid or fluid collections within the abdomen or pelvis. Musculoskeletal: No acute or significant osseous findings. IMPRESSION: 1. Marked distension of the urinary bladder with bilateral pelvocaliectasis and hydroureter. The bladder as a volume of 1270 mL. 2. Large ventral abdominal wall hernia contains nonobstructed loops of small and large bowel. 3. Bilateral kidney lesions of varying density are incompletely characterized without IV contrast material. This includes a hyperdense lesion arising from the upper pole of left kidney which may represent a solid kidney lesion or hemorrhagic cysts. 4.  Aortic Atherosclerosis (ICD10-I70.0). Electronically Signed   By: Kerby Moors M.D.   On: 02/11/2018 14:35   Dg Abd 1 View  Result Date: 02/10/2018 CLINICAL DATA:  Abdominal pain. EXAM: ABDOMEN - 1 VIEW COMPARISON:  Ultrasound 11/16/2016.  05/03/2016. FINDINGS: Surgical clips noted over the abdomen. Soft tissue structures are unremarkable. No bowel distention. No free air.  Aortoiliac atherosclerotic vascular calcification. Degenerative change lumbar spine and both hips IMPRESSION: No acute abnormality. Electronically Signed   By: Marcello Moores  Register   On: 02/10/2018 11:44   US Renal  Result Date: 02/15/2018 CLINICAL DATA:  Urinary retention. EXAM: RENAL / URINARY TRACT ULTRASOUND COMPLETE COMPARISON:  CT scan February 11, 2018 FINDINGS: Right Kidney: Renal measurements: 10.2 x 5.2 x 6.4 cm = volume: 175.5 mL. Contains a 2.1 cm cyst. No hydronephrosis. Left Kidney: Renal measurements: 12.7 x 4.9 x 4.7 cm = volume: 152.4 mL. Contains 2 cysts with the largest measuring 2.9 cm. Bladder: Decompressed and not well evaluated. IMPRESSION: 1. Renal cysts.  No hydronephrosis. 2. The bladder is decompressed and not well evaluated. Electronically Signed   By: Dorise Bullion III M.D   On: 02/15/2018 14:15    Assessment/Plan T #1 history of urinary retention she is just seen urology with orders as noted above if she has between 350 and 500 cc of retained urine straight cath--if greater than 500 place a Foley catheter-there will be follow-up by urology she continues on Flomax.  2.  Type 2 diabetes blood sugars in the morning appear to be more in the mid 100s somewhat more elevated at at bedtime but it was 154 2 nights ago this may be trending down but hard to tell- secondary to her advanced age and comorbidities these have been somewhat conservative at this point will monitor  Hemoglobin A1c was 7.7.  She is on Lantus 12 units in the morning.  3.  Hypertension she is on Norvasc and hydralazine as noted above this appears relatively stable with recent systolics in the 1 32-9 40 range.  4.-History of recurrent UTI she is on trimethoprim phylactic Lee.  5.  History of hypothyroidism recent TSH was mildly elevated suspect she will need follow-up as an outpatient point will monitor.  6.  History of chronic kidney disease this appears stable creatinine on lab done on December 16 was  1.36 which actually has shown some improvement in the previous lab.  7.  History of pneumonia she is status post antibiotics she continues on Zyrtec and Flonase and this appears to be stable.  8.  History of coronary artery disease this appears asymptomatic currently she is on aspirin and a statin.  [id number]

## 2018-03-13 ENCOUNTER — Non-Acute Institutional Stay (SKILLED_NURSING_FACILITY): Payer: Medicare Other | Admitting: Internal Medicine

## 2018-03-13 ENCOUNTER — Encounter: Payer: Self-pay | Admitting: Internal Medicine

## 2018-03-13 DIAGNOSIS — J189 Pneumonia, unspecified organism: Secondary | ICD-10-CM

## 2018-03-13 DIAGNOSIS — E119 Type 2 diabetes mellitus without complications: Secondary | ICD-10-CM

## 2018-03-13 DIAGNOSIS — I2510 Atherosclerotic heart disease of native coronary artery without angina pectoris: Secondary | ICD-10-CM

## 2018-03-13 DIAGNOSIS — E039 Hypothyroidism, unspecified: Secondary | ICD-10-CM | POA: Diagnosis not present

## 2018-03-13 DIAGNOSIS — R339 Retention of urine, unspecified: Secondary | ICD-10-CM

## 2018-03-13 DIAGNOSIS — I10 Essential (primary) hypertension: Secondary | ICD-10-CM

## 2018-03-13 NOTE — Progress Notes (Signed)
Location:    [facility] Room Number: 151/P Place of Service:  SNF (31)  Provider: Granville Lewis PA-C  PCP: Sinda Du, MD Patient Care Team: Sinda Du, MD as PCP - General (Pulmonary Disease)  Extended Emergency Contact Information Primary Emergency Contact: Knowlton,[name] Address: [address]          [place], [place] [address] Johnnette Litter of [place] Phone: [phone] Mobile Phone: [phone] Relation: Daughter  Code Status: DNR Goals of care:  Advanced Directive information Advanced Directives 03/13/2018  Does Patient Have a Medical Advance Directive? Yes  Type of Advance Directive Out of facility DNR (pink MOST or yellow form)  Does patient want to make changes to medical advance directive? No - Patient declined  Copy of [place] in Chart? -  Would patient like information on creating a medical advance directive? No - Patient declined     Allergies  Allergen Reactions  . Cymbalta [Duloxetine Hcl] Other (See Comments)    unknown  . Effexor [Venlafaxine] Other (See Comments)    unknown  . Morphine And Related Other (See Comments)    Passed out   . Tape     adhesive    Chief Complaint  Patient presents with  . Discharge Note    Duscharge Visit    HPI:  84 y.o. female seen today for discharge from facility-she will be going back to her assisted living center.  She was admitted to the hospital late November early December for pneumonia mental status changes with urinary retention.  She required a Foley catheter for urinary retention and was discharged to the facility.  She had a voiding trial but failed this and was started on Flomax she did well but then had lower abdominal pain with distention and had placement of a Foley catheter with 900 cc of output.  She saw urology last week and they recommended a scale where if there is less than 350 cc of urinary retention to monitor-however if urinary  retention is greater than 350 up to 500 to do  in and out catheter- retention is greater than 500 Place Foley catheter  It appears she has been voiding although the history on this is somewhat unclear.  Clinically she appears to be doing well and has no complaints.  Regards to pneumonia she did complete antibiotics continues on Zyrtec and Flonase this appears to be stable.  She is not complaining of increased cough or any shortness of breath appears to be doing well.  She also has a history of type 2 diabetes is on Lantus 12 units a day.  Blood sugar this morning was 153 last night was 199- it appears her blood sugars run mainly from the mid 100s to occasionally mid 200's--- I see one above /300 but this is pretty rare-her hemoglobin A1c was 7.7 at this point will defer to primary care provider  She also has a history of hypertension on Norvasc and hydralazine.  Her systolic blood pressures continue to run in the 04/14/1936 range at this point will defer to primary care provider.  She also has a history of chronic renal disease last lab showed a creatinine of 1.36 which appears to have improved over the previous lab this will warrant updating this week I suspect home health can draw this and notify her primary care provider of results.  She also has a mildly elevated TSH of 6.79-she is on Synthroid again will defer to primary care provider for any  aggressive follow-up of this.  Clinically she appears to be doing well she is sitting in her wheelchair comfortably appears to be looking forward to getting back to her assisted living home    Past Medical History:  Diagnosis Date  . Anemia   . Arthritis   . Cerebral infarction ([place])   . Coronary artery disease   . Dementia ([place])   . Diabetes mellitus without complication (HCC)    Type 2  . Dysphagia due to recent cerebral infarction   . Failure to thrive (0-17)   . Hypercalcemia   . Hypertension   . Pneumonitis   . Protein calorie  malnutrition ([place])   . Subdural hematoma ([place])   . Thyroid disease     History reviewed. No pertinent surgical history.    reports that she has never smoked. She has never used smokeless tobacco. She reports that she does not drink alcohol or use drugs. Social History   Socioeconomic History  . Marital status: Married    Spouse name: Not on file  . Number of children: Not on file  . Years of education: Not on file  . Highest education level: Not on file  Occupational History  . Not on file  Social Needs  . Financial resource strain: Not hard at all  . Food insecurity:    Worry: Never true    Inability: Never true  . Transportation needs:    Medical: Patient refused    Non-medical: Patient refused  Tobacco Use  . Smoking status: Never Smoker  . Smokeless tobacco: Never Used  Substance and Sexual Activity  . Alcohol use: No  . Drug use: No  . Sexual activity: Not Currently  Lifestyle  . Physical activity:    Days per week: Patient refused    Minutes per session: Patient refused  . Stress: Not at all  Relationships  . Social connections:    Talks on phone: Patient refused    Gets together: Patient refused    Attends religious service: Patient refused    Active member of club or organization: Patient refused    Attends meetings of clubs or organizations: Patient refused    Relationship status: Patient refused  . Intimate partner violence:    Fear of current or ex partner: Patient refused    Emotionally abused: Patient refused    Physically abused: Patient refused    Forced sexual activity: Patient refused  Other Topics Concern  . Not on file  Social History Narrative  . Not on file   Functional Status Survey:    Allergies  Allergen Reactions  . Cymbalta [Duloxetine Hcl] Other (See Comments)    unknown  . Effexor [Venlafaxine] Other (See Comments)    unknown  . Morphine And Related Other (See Comments)    Passed out   . Tape     adhesive    Pertinent   Health Maintenance Due  Topic Date Due  . INFLUENZA VACCINE  03/20/2018 (Originally 10/13/2017)  . FOOT EXAM  03/20/2018 (Originally 07/20/1937)  . OPHTHALMOLOGY EXAM  03/20/2018 (Originally 07/20/1937)  . URINE MICROALBUMIN  03/20/2018 (Originally 07/20/1937)  . DEXA SCAN  03/20/2018 (Originally 07/20/1994)  . PNA vac Low Risk Adult (1 of 2 - PCV13) 03/20/2018 (Originally 07/20/1994)  . HEMOGLOBIN A1C  08/29/2018    Medications: Outpatient Encounter Medications as of 03/13/2018  Medication Sig  . acetaminophen (TYLENOL) 325 MG tablet Take 2 tablets (650 mg total) by mouth every 6 (six) hours as needed for mild  pain or moderate pain.  Marland Kitchen amLODipine (NORVASC) 10 MG tablet Take 10 mg by mouth daily.   Marland Kitchen aspirin EC 81 MG tablet Take 81 mg by mouth daily.  Marland Kitchen atorvastatin (LIPITOR) 20 MG tablet Take 20 mg by mouth daily.   . cetirizine (ZYRTEC) 10 MG tablet Take 10 mg by mouth at bedtime as needed for allergies.  . cholecalciferol (VITAMIN D) 1000 units tablet Take 1,000 Units by mouth daily.   . clotrimazole-betamethasone (LOTRISONE) cream Apply 1 application topically 2 (two) times daily. Apply 2 grams to affected areas daily until resolved  . dextromethorphan-guaiFENesin (ROBITUSSIN-DM) 10-100 MG/5ML liquid Take 5 mLs by mouth every 4 (four) hours as needed for cough. Give 5cc by mouth every 4 hours as needed for cough.  . dextrose (GLUTOSE 15) 40 % GEL Take 1 Tube by mouth. Give 1 application by mouth as needed for blood sugar less than 60 recheck blood sugar in 15 minutes; notify MD if blood sugar doesn't increase.  . famotidine (PEPCID) 20 MG tablet Take 20 mg by mouth daily.   . feeding supplement, ENSURE ENLIVE, (ENSURE ENLIVE) LIQD Take 237 mLs by mouth 2 (two) times daily between meals.  . fluconazole (DIFLUCAN) 200 MG tablet Take 200 mg by mouth daily. For 5 days from 03/10/2018-03/14/2018  . fluticasone (FLONASE) 50 MCG/ACT nasal spray Place 2 sprays into both nostrils daily.  . hydrALAZINE  (APRESOLINE) 25 MG tablet Take 25 mg by mouth 3 (three) times daily. Give 1 tablet by mouth three times a day for HTN hold for systolic BP <509  . insulin glargine (LANTUS) 100 UNIT/ML injection Inject 0.12 mLs (12 Units total) into the skin daily.  Marland Kitchen ketoconazole (NIZORAL) 2 % cream Apply 1 application topically daily. Apply to abdominal folds under breast, groin area and sacrum area where redness and rash is located report to provider if no improvements is noted.  Marland Kitchen levothyroxine (SYNTHROID, LEVOTHROID) 50 MCG tablet Take 50 mcg by mouth daily before breakfast.   . magnesium oxide (MAG-OX) 400 MG tablet Take 400 mg by mouth daily.   . Multiple Vitamin (MULTIVITAMIN WITH MINERALS) TABS tablet Take 1 tablet by mouth daily.   . tamsulosin (FLOMAX) 0.4 MG CAPS capsule Take 0.4 mg by mouth every evening.  . traZODone (DESYREL) 50 MG tablet Take 50 mg by mouth at bedtime.  Marland Kitchen trimethoprim (TRIMPEX) 100 MG tablet Take 100 mg by mouth daily. Give 1 tablet by mouth one time a day for preventative.  . Wheat Dextrin (BENEFIBER PO) Take 10 mLs by mouth daily.    No facility-administered encounter medications on file as of 03/13/2018.      Review of Systems   In general she is not complaining of fever chills.  Skin is not complaining of itching she does have a perineal rash and is finishing a course of Diflucan  Head ears eyes nose mouth and throat is not complain of any sore throat or visual changes.  Respiratory did not complain of any cough or shortness of breath.  Cardiac does not complain of chest pain or significant lower extremity edema.  GI is not complaining of abdominal pain nausea vomiting diarrhea or constipation.  GU has a history here as noted above she is not really complaining of any dysuria or suprapubic pain.  Musculoskeletal does not complain of joint pain currently.  Neurologic is not complaining of dizziness headache or feeling syncopal.  And psych continues to be in good  spirits does not complain of being  anxious or depressed appears to be looking forward to going back to her assisted living facility  Vitals:   03/13/18 1005  BP: (!) 132/57  Pulse: (!) 56  Resp: 20  Temp: 98 F (36.7 C)  TempSrc: Oral  SpO2: 96%  Weight is 176 pounds  Physical Exam   In general this is a pleasant elderly female in no distress sitting comfortably in her wheelchair.  Her skin is warm and dry.  Rash was difficult to assess secondary to patient positioning per nursing staff is improving  Eyes visual acuity appears to be intact sclera and conjunctive are clear.  Oropharynx clear mucous membranes moist.  Chest is clear to auscultation with shallow air entry could not appreciate any labored breathing or congestion.  Heart is slightly bradycardic without murmur gallop or rub she has minimal lower extremity edema.  Abdomen is obese soft nontender with positive bowel sounds.  GU could not really appreciate suprapubic tenderness.  Musculoskeletal is able to move all extremities x4 moves them at baseline strength and range of motion.  Neurologic is grossly intact her speech is clear no lateralizing findings.  Psych she is oriented to self she is pleasant appropriate can carry on a short conversation.    Labs reviewed: Basic Metabolic Panel: Recent Labs    02/15/18 0543 02/20/18 0530 02/27/18 0700  NA 143 140 138  K 4.6 4.8 4.3  CL 113* 108 107  CO2 24 27 25   GLUCOSE 98 126* 131*  BUN 23 31* 30*  CREATININE 1.22*  1.17* 1.96* 1.36*  CALCIUM 8.8* 9.0 9.0   Liver Function Tests: Recent Labs    02/03/18 1915 02/10/18 1207  AST 20 17  ALT 15 17  ALKPHOS 67 64  BILITOT 0.7 0.8  PROT 6.8 7.2  ALBUMIN 3.8 3.6   No results for input(s): LIPASE, AMYLASE in the last 8760 hours. Recent Labs    02/10/18 1414  AMMONIA <9*   CBC: Recent Labs    02/10/18 1207 02/13/18 0505 02/20/18 0530 02/27/18 0700  WBC 9.1 8.1 9.6 7.8  NEUTROABS 7.3 5.1 6.5   --   HGB 13.0 12.1 12.2 11.9*  HCT 42.7 39.7 39.3 38.6  MCV 98.2 99.0 98.7 97.0  PLT 269 253 240 199   Cardiac Enzymes: Recent Labs    02/03/18 1915  TROPONINI <0.03   BNP: Invalid input(s): POCBNP CBG: Recent Labs    02/15/18 2126 02/16/18 0742 02/16/18 1042  GLUCAP 158* 99 159*    Procedures and Imaging Studies During Stay: Ct Abdomen Pelvis Wo Contrast  Result Date: 02/11/2018 CLINICAL DATA:  Abdominal pain. EXAM: CT ABDOMEN AND PELVIS WITHOUT CONTRAST TECHNIQUE: Multidetector CT imaging of the abdomen and pelvis was performed following the standard protocol without IV contrast. COMPARISON:  None FINDINGS: Lower chest: No acute abnormality. Hepatobiliary: No focal liver abnormality is seen. No gallstones, gallbladder wall thickening, or biliary dilatation. Pancreas: Unremarkable. No pancreatic ductal dilatation or surrounding inflammatory changes. Spleen: Normal in size without focal abnormality. Adrenals/Urinary Tract: Small calcifications are identified within both adrenal glands. No discrete mass identified. Bilateral kidney cysts are noted which are incompletely characterized without IV contrast material. Dominant cyst arises from the medial cortex of the inferior pole of right kidney measuring 5.2 cm. Hyperdense lesion arising from the upper pole of the left kidney measures 2.1 cm and 35 HU. This may represent a hemorrhagic cyst or solid kidney lesion. Bilateral pelvocaliectasis is identified along with hydroureter. The urinary bladder is markedly distended measuring 12.5  by 12.8 by 15.1 cm (volume = 1270 cm^3). Stomach/Bowel: Stomach appears nondistended. There is no small or large bowel dilatation. Within the limitations of unenhanced technique there is no bowel wall thickening or inflammation noted. There is a large ventral abdominal wall hernia measuring 12.6 cm and contains nonobstructed loops of large and small bowel. The appendix is visualized and appears normal.  Vascular/Lymphatic: Aortic atherosclerosis. No aneurysm. No enlarged abdominal or pelvic lymph nodes. Reproductive: Uterus and bilateral adnexa are unremarkable. Other: No free fluid or fluid collections within the abdomen or pelvis. Musculoskeletal: No acute or significant osseous findings. IMPRESSION: 1. Marked distension of the urinary bladder with bilateral pelvocaliectasis and hydroureter. The bladder as a volume of 1270 mL. 2. Large ventral abdominal wall hernia contains nonobstructed loops of small and large bowel. 3. Bilateral kidney lesions of varying density are incompletely characterized without IV contrast material. This includes a hyperdense lesion arising from the upper pole of left kidney which may represent a solid kidney lesion or hemorrhagic cysts. 4.  Aortic Atherosclerosis (ICD10-I70.0). Electronically Signed   By: Kerby Moors M.D.   On: 02/11/2018 14:35   US Renal  Result Date: 02/15/2018 CLINICAL DATA:  Urinary retention. EXAM: RENAL / URINARY TRACT ULTRASOUND COMPLETE COMPARISON:  CT scan February 11, 2018 FINDINGS: Right Kidney: Renal measurements: 10.2 x 5.2 x 6.4 cm = volume: 175.5 mL. Contains a 2.1 cm cyst. No hydronephrosis. Left Kidney: Renal measurements: 12.7 x 4.9 x 4.7 cm = volume: 152.4 mL. Contains 2 cysts with the largest measuring 2.9 cm. Bladder: Decompressed and not well evaluated. IMPRESSION: 1. Renal cysts.  No hydronephrosis. 2. The bladder is decompressed and not well evaluated. Electronically Signed   By: Dorise Bullion III M.D   On: 02/15/2018 14:15    Assessment/Plan:    #1 history of pneumonia-she appears to have made a nice recovery from this she does continue on Flonase and Zyrtec.  2.-  History of urinary retention involved history as noted above apparently she is voiding but this will need close follow-up by urology- clinically she appears to be stable does not complain of dysuria or suprapubic pain She does continue on trimethoprim  empirically. She also continues on Flomax  3.-  History of chronic kidney disease creatinine showed improvement at 1.36 on lab done on December 16 will have this updated this week and primary care provider notified of results appears her baseline is about the 1.2-1.3 range.  #4- history of hypertension at this point continue current medications Norvasc and hydralazine appears recent blood pressures have been in the 258-527 range systolically.  5.  History of type 2 diabetes blood sugars appear to be more mid 100s to mid 200s-secondary to her going home would be hesitant to be real aggressive increasing her Lantus at this point will defer to primary care provider.  #6 coronary artery disease this is been asymptomatic during her stay here she is on aspirin as well as a statin  7.  Insomnia she continues on trazodone apparently this has been effective and tolerated well.  8.  Perineal rash she has completed a course of Diflucan will warrant follow-up as needed she is not complaining of any discomfort or itching.  9 history of hypothyroidism she is on Synthroid 50 mcg a day TSH is mildly elevated at over 6 will defer to primary care provider about any change in medication dosage  [id number]-of note greater than 30 minutes spent on this discharge summary-greater than 50% of time spent coordinating  a plan of care for numerous diagnoses

## 2018-03-14 ENCOUNTER — Other Ambulatory Visit (HOSPITAL_COMMUNITY)
Admission: RE | Admit: 2018-03-14 | Discharge: 2018-03-14 | Disposition: A | Discharge status: Home / Self Care | Payer: Medicare Other | Source: Ambulatory Visit | Attending: Pulmonary Disease | Admitting: Pulmonary Disease

## 2018-03-14 DIAGNOSIS — R339 Retention of urine, unspecified: Secondary | ICD-10-CM | POA: Diagnosis not present

## 2018-03-14 DIAGNOSIS — I129 Hypertensive chronic kidney disease with stage 1 through stage 4 chronic kidney disease, or unspecified chronic kidney disease: Secondary | ICD-10-CM | POA: Diagnosis not present

## 2018-03-14 DIAGNOSIS — R1310 Dysphagia, unspecified: Secondary | ICD-10-CM | POA: Diagnosis not present

## 2018-03-14 DIAGNOSIS — Z794 Long term (current) use of insulin: Secondary | ICD-10-CM | POA: Diagnosis not present

## 2018-03-14 DIAGNOSIS — I10 Essential (primary) hypertension: Secondary | ICD-10-CM | POA: Insufficient documentation

## 2018-03-14 DIAGNOSIS — I2510 Atherosclerotic heart disease of native coronary artery without angina pectoris: Secondary | ICD-10-CM | POA: Diagnosis not present

## 2018-03-14 DIAGNOSIS — F0390 Unspecified dementia without behavioral disturbance: Secondary | ICD-10-CM | POA: Diagnosis not present

## 2018-03-14 DIAGNOSIS — E1122 Type 2 diabetes mellitus with diabetic chronic kidney disease: Secondary | ICD-10-CM | POA: Diagnosis not present

## 2018-03-14 DIAGNOSIS — Z7982 Long term (current) use of aspirin: Secondary | ICD-10-CM | POA: Diagnosis not present

## 2018-03-14 DIAGNOSIS — J189 Pneumonia, unspecified organism: Secondary | ICD-10-CM | POA: Diagnosis not present

## 2018-03-14 DIAGNOSIS — I69391 Dysphagia following cerebral infarction: Secondary | ICD-10-CM | POA: Diagnosis not present

## 2018-03-14 DIAGNOSIS — N183 Chronic kidney disease, stage 3 (moderate): Secondary | ICD-10-CM | POA: Diagnosis not present

## 2018-03-14 DIAGNOSIS — Z87440 Personal history of urinary (tract) infections: Secondary | ICD-10-CM | POA: Diagnosis not present

## 2018-03-14 LAB — CBC WITH DIFFERENTIAL/PLATELET
Abs Immature Granulocytes: 0.16 10*3/uL — ABNORMAL HIGH (ref 0.00–0.07)
Basophils Absolute: 0.1 10*3/uL (ref 0.0–0.1)
Basophils Relative: 1 %
Eosinophils Absolute: 0.1 10*3/uL (ref 0.0–0.5)
Eosinophils Relative: 1 %
HCT: 40.7 % (ref 36.0–46.0)
Hemoglobin: 12.9 g/dL (ref 12.0–15.0)
Immature Granulocytes: 2 %
Lymphocytes Relative: 15 %
Lymphs Abs: 1.5 10*3/uL (ref 0.7–4.0)
MCH: 30.9 pg (ref 26.0–34.0)
MCHC: 31.7 g/dL (ref 30.0–36.0)
MCV: 97.4 fL (ref 80.0–100.0)
Monocytes Absolute: 0.7 10*3/uL (ref 0.1–1.0)
Monocytes Relative: 7 %
NEUTROS ABS: 7.7 10*3/uL (ref 1.7–7.7)
Neutrophils Relative %: 74 %
Platelets: 229 10*3/uL (ref 150–400)
RBC: 4.18 MIL/uL (ref 3.87–5.11)
RDW: 13.2 % (ref 11.5–15.5)
WBC: 10.3 10*3/uL (ref 4.0–10.5)
nRBC: 0 % (ref 0.0–0.2)

## 2018-03-14 LAB — BASIC METABOLIC PANEL
Anion gap: 10 (ref 5–15)
BUN: 36 mg/dL — ABNORMAL HIGH (ref 8–23)
CO2: 21 mmol/L — ABNORMAL LOW (ref 22–32)
Calcium: 9.4 mg/dL (ref 8.9–10.3)
Chloride: 106 mmol/L (ref 98–111)
Creatinine, Ser: 2.07 mg/dL — ABNORMAL HIGH (ref 0.44–1.00)
GFR calc Af Amer: 24 mL/min — ABNORMAL LOW (ref >60)
GFR calc non Af Amer: 21 mL/min — ABNORMAL LOW (ref >60)
Glucose, Bld: 330 mg/dL — ABNORMAL HIGH (ref 70–99)
Potassium: 5.1 mmol/L (ref 3.5–5.1)
Sodium: 137 mmol/L (ref 135–145)

## 2018-03-16 DIAGNOSIS — J189 Pneumonia, unspecified organism: Secondary | ICD-10-CM | POA: Diagnosis not present

## 2018-03-16 DIAGNOSIS — I2510 Atherosclerotic heart disease of native coronary artery without angina pectoris: Secondary | ICD-10-CM | POA: Diagnosis not present

## 2018-03-16 DIAGNOSIS — E1122 Type 2 diabetes mellitus with diabetic chronic kidney disease: Secondary | ICD-10-CM | POA: Diagnosis not present

## 2018-03-16 DIAGNOSIS — R339 Retention of urine, unspecified: Secondary | ICD-10-CM | POA: Diagnosis not present

## 2018-03-16 DIAGNOSIS — N183 Chronic kidney disease, stage 3 (moderate): Secondary | ICD-10-CM | POA: Diagnosis not present

## 2018-03-16 DIAGNOSIS — I129 Hypertensive chronic kidney disease with stage 1 through stage 4 chronic kidney disease, or unspecified chronic kidney disease: Secondary | ICD-10-CM | POA: Diagnosis not present

## 2018-03-17 DIAGNOSIS — N183 Chronic kidney disease, stage 3 (moderate): Secondary | ICD-10-CM | POA: Diagnosis not present

## 2018-03-17 DIAGNOSIS — E1122 Type 2 diabetes mellitus with diabetic chronic kidney disease: Secondary | ICD-10-CM | POA: Diagnosis not present

## 2018-03-17 DIAGNOSIS — I2510 Atherosclerotic heart disease of native coronary artery without angina pectoris: Secondary | ICD-10-CM | POA: Diagnosis not present

## 2018-03-17 DIAGNOSIS — I129 Hypertensive chronic kidney disease with stage 1 through stage 4 chronic kidney disease, or unspecified chronic kidney disease: Secondary | ICD-10-CM | POA: Diagnosis not present

## 2018-03-17 DIAGNOSIS — J189 Pneumonia, unspecified organism: Secondary | ICD-10-CM | POA: Diagnosis not present

## 2018-03-17 DIAGNOSIS — R339 Retention of urine, unspecified: Secondary | ICD-10-CM | POA: Diagnosis not present

## 2018-03-19 DIAGNOSIS — N183 Chronic kidney disease, stage 3 (moderate): Secondary | ICD-10-CM | POA: Diagnosis not present

## 2018-03-19 DIAGNOSIS — I2510 Atherosclerotic heart disease of native coronary artery without angina pectoris: Secondary | ICD-10-CM | POA: Diagnosis not present

## 2018-03-19 DIAGNOSIS — R339 Retention of urine, unspecified: Secondary | ICD-10-CM | POA: Diagnosis not present

## 2018-03-19 DIAGNOSIS — E1122 Type 2 diabetes mellitus with diabetic chronic kidney disease: Secondary | ICD-10-CM | POA: Diagnosis not present

## 2018-03-19 DIAGNOSIS — J189 Pneumonia, unspecified organism: Secondary | ICD-10-CM | POA: Diagnosis not present

## 2018-03-19 DIAGNOSIS — I129 Hypertensive chronic kidney disease with stage 1 through stage 4 chronic kidney disease, or unspecified chronic kidney disease: Secondary | ICD-10-CM | POA: Diagnosis not present

## 2018-03-20 DIAGNOSIS — J189 Pneumonia, unspecified organism: Secondary | ICD-10-CM | POA: Diagnosis not present

## 2018-03-20 DIAGNOSIS — N183 Chronic kidney disease, stage 3 (moderate): Secondary | ICD-10-CM | POA: Diagnosis not present

## 2018-03-20 DIAGNOSIS — R339 Retention of urine, unspecified: Secondary | ICD-10-CM | POA: Diagnosis not present

## 2018-03-20 DIAGNOSIS — I129 Hypertensive chronic kidney disease with stage 1 through stage 4 chronic kidney disease, or unspecified chronic kidney disease: Secondary | ICD-10-CM | POA: Diagnosis not present

## 2018-03-20 DIAGNOSIS — E1122 Type 2 diabetes mellitus with diabetic chronic kidney disease: Secondary | ICD-10-CM | POA: Diagnosis not present

## 2018-03-20 DIAGNOSIS — I2510 Atherosclerotic heart disease of native coronary artery without angina pectoris: Secondary | ICD-10-CM | POA: Diagnosis not present

## 2018-03-21 DIAGNOSIS — E1122 Type 2 diabetes mellitus with diabetic chronic kidney disease: Secondary | ICD-10-CM | POA: Diagnosis not present

## 2018-03-21 DIAGNOSIS — I129 Hypertensive chronic kidney disease with stage 1 through stage 4 chronic kidney disease, or unspecified chronic kidney disease: Secondary | ICD-10-CM | POA: Diagnosis not present

## 2018-03-21 DIAGNOSIS — R339 Retention of urine, unspecified: Secondary | ICD-10-CM | POA: Diagnosis not present

## 2018-03-21 DIAGNOSIS — N183 Chronic kidney disease, stage 3 (moderate): Secondary | ICD-10-CM | POA: Diagnosis not present

## 2018-03-21 DIAGNOSIS — J189 Pneumonia, unspecified organism: Secondary | ICD-10-CM | POA: Diagnosis not present

## 2018-03-21 DIAGNOSIS — I2510 Atherosclerotic heart disease of native coronary artery without angina pectoris: Secondary | ICD-10-CM | POA: Diagnosis not present

## 2018-03-22 DIAGNOSIS — E1122 Type 2 diabetes mellitus with diabetic chronic kidney disease: Secondary | ICD-10-CM | POA: Diagnosis not present

## 2018-03-22 DIAGNOSIS — R339 Retention of urine, unspecified: Secondary | ICD-10-CM | POA: Diagnosis not present

## 2018-03-22 DIAGNOSIS — I129 Hypertensive chronic kidney disease with stage 1 through stage 4 chronic kidney disease, or unspecified chronic kidney disease: Secondary | ICD-10-CM | POA: Diagnosis not present

## 2018-03-22 DIAGNOSIS — I2510 Atherosclerotic heart disease of native coronary artery without angina pectoris: Secondary | ICD-10-CM | POA: Diagnosis not present

## 2018-03-22 DIAGNOSIS — N183 Chronic kidney disease, stage 3 (moderate): Secondary | ICD-10-CM | POA: Diagnosis not present

## 2018-03-22 DIAGNOSIS — J189 Pneumonia, unspecified organism: Secondary | ICD-10-CM | POA: Diagnosis not present

## 2018-03-23 DIAGNOSIS — E1122 Type 2 diabetes mellitus with diabetic chronic kidney disease: Secondary | ICD-10-CM | POA: Diagnosis not present

## 2018-03-23 DIAGNOSIS — N183 Chronic kidney disease, stage 3 (moderate): Secondary | ICD-10-CM | POA: Diagnosis not present

## 2018-03-23 DIAGNOSIS — J189 Pneumonia, unspecified organism: Secondary | ICD-10-CM | POA: Diagnosis not present

## 2018-03-23 DIAGNOSIS — I129 Hypertensive chronic kidney disease with stage 1 through stage 4 chronic kidney disease, or unspecified chronic kidney disease: Secondary | ICD-10-CM | POA: Diagnosis not present

## 2018-03-23 DIAGNOSIS — R339 Retention of urine, unspecified: Secondary | ICD-10-CM | POA: Diagnosis not present

## 2018-03-23 DIAGNOSIS — I2510 Atherosclerotic heart disease of native coronary artery without angina pectoris: Secondary | ICD-10-CM | POA: Diagnosis not present

## 2018-03-24 DIAGNOSIS — E1122 Type 2 diabetes mellitus with diabetic chronic kidney disease: Secondary | ICD-10-CM | POA: Diagnosis not present

## 2018-03-24 DIAGNOSIS — N183 Chronic kidney disease, stage 3 (moderate): Secondary | ICD-10-CM | POA: Diagnosis not present

## 2018-03-24 DIAGNOSIS — R339 Retention of urine, unspecified: Secondary | ICD-10-CM | POA: Diagnosis not present

## 2018-03-24 DIAGNOSIS — I129 Hypertensive chronic kidney disease with stage 1 through stage 4 chronic kidney disease, or unspecified chronic kidney disease: Secondary | ICD-10-CM | POA: Diagnosis not present

## 2018-03-24 DIAGNOSIS — J189 Pneumonia, unspecified organism: Secondary | ICD-10-CM | POA: Diagnosis not present

## 2018-03-24 DIAGNOSIS — I2510 Atherosclerotic heart disease of native coronary artery without angina pectoris: Secondary | ICD-10-CM | POA: Diagnosis not present

## 2018-03-27 DIAGNOSIS — I2510 Atherosclerotic heart disease of native coronary artery without angina pectoris: Secondary | ICD-10-CM | POA: Diagnosis not present

## 2018-03-27 DIAGNOSIS — R339 Retention of urine, unspecified: Secondary | ICD-10-CM | POA: Diagnosis not present

## 2018-03-27 DIAGNOSIS — J189 Pneumonia, unspecified organism: Secondary | ICD-10-CM | POA: Diagnosis not present

## 2018-03-27 DIAGNOSIS — I129 Hypertensive chronic kidney disease with stage 1 through stage 4 chronic kidney disease, or unspecified chronic kidney disease: Secondary | ICD-10-CM | POA: Diagnosis not present

## 2018-03-27 DIAGNOSIS — N183 Chronic kidney disease, stage 3 (moderate): Secondary | ICD-10-CM | POA: Diagnosis not present

## 2018-03-27 DIAGNOSIS — E1122 Type 2 diabetes mellitus with diabetic chronic kidney disease: Secondary | ICD-10-CM | POA: Diagnosis not present

## 2018-03-28 DIAGNOSIS — R339 Retention of urine, unspecified: Secondary | ICD-10-CM | POA: Diagnosis not present

## 2018-03-28 DIAGNOSIS — I129 Hypertensive chronic kidney disease with stage 1 through stage 4 chronic kidney disease, or unspecified chronic kidney disease: Secondary | ICD-10-CM | POA: Diagnosis not present

## 2018-03-28 DIAGNOSIS — J189 Pneumonia, unspecified organism: Secondary | ICD-10-CM | POA: Diagnosis not present

## 2018-03-28 DIAGNOSIS — N183 Chronic kidney disease, stage 3 (moderate): Secondary | ICD-10-CM | POA: Diagnosis not present

## 2018-03-28 DIAGNOSIS — E1122 Type 2 diabetes mellitus with diabetic chronic kidney disease: Secondary | ICD-10-CM | POA: Diagnosis not present

## 2018-03-28 DIAGNOSIS — I2510 Atherosclerotic heart disease of native coronary artery without angina pectoris: Secondary | ICD-10-CM | POA: Diagnosis not present

## 2018-03-29 DIAGNOSIS — E1165 Type 2 diabetes mellitus with hyperglycemia: Secondary | ICD-10-CM | POA: Diagnosis not present

## 2018-03-29 DIAGNOSIS — F0390 Unspecified dementia without behavioral disturbance: Secondary | ICD-10-CM | POA: Diagnosis not present

## 2018-03-29 DIAGNOSIS — K210 Gastro-esophageal reflux disease with esophagitis: Secondary | ICD-10-CM | POA: Diagnosis not present

## 2018-03-29 DIAGNOSIS — I10 Essential (primary) hypertension: Secondary | ICD-10-CM | POA: Diagnosis not present

## 2018-03-30 DIAGNOSIS — J189 Pneumonia, unspecified organism: Secondary | ICD-10-CM | POA: Diagnosis not present

## 2018-03-30 DIAGNOSIS — E1122 Type 2 diabetes mellitus with diabetic chronic kidney disease: Secondary | ICD-10-CM | POA: Diagnosis not present

## 2018-03-30 DIAGNOSIS — I129 Hypertensive chronic kidney disease with stage 1 through stage 4 chronic kidney disease, or unspecified chronic kidney disease: Secondary | ICD-10-CM | POA: Diagnosis not present

## 2018-03-30 DIAGNOSIS — R339 Retention of urine, unspecified: Secondary | ICD-10-CM | POA: Diagnosis not present

## 2018-03-30 DIAGNOSIS — I2510 Atherosclerotic heart disease of native coronary artery without angina pectoris: Secondary | ICD-10-CM | POA: Diagnosis not present

## 2018-03-30 DIAGNOSIS — N183 Chronic kidney disease, stage 3 (moderate): Secondary | ICD-10-CM | POA: Diagnosis not present

## 2018-04-03 DIAGNOSIS — I2510 Atherosclerotic heart disease of native coronary artery without angina pectoris: Secondary | ICD-10-CM | POA: Diagnosis not present

## 2018-04-03 DIAGNOSIS — E1122 Type 2 diabetes mellitus with diabetic chronic kidney disease: Secondary | ICD-10-CM | POA: Diagnosis not present

## 2018-04-03 DIAGNOSIS — N183 Chronic kidney disease, stage 3 (moderate): Secondary | ICD-10-CM | POA: Diagnosis not present

## 2018-04-03 DIAGNOSIS — I129 Hypertensive chronic kidney disease with stage 1 through stage 4 chronic kidney disease, or unspecified chronic kidney disease: Secondary | ICD-10-CM | POA: Diagnosis not present

## 2018-04-03 DIAGNOSIS — R339 Retention of urine, unspecified: Secondary | ICD-10-CM | POA: Diagnosis not present

## 2018-04-03 DIAGNOSIS — J189 Pneumonia, unspecified organism: Secondary | ICD-10-CM | POA: Diagnosis not present

## 2018-04-04 DIAGNOSIS — N183 Chronic kidney disease, stage 3 (moderate): Secondary | ICD-10-CM | POA: Diagnosis not present

## 2018-04-04 DIAGNOSIS — E1122 Type 2 diabetes mellitus with diabetic chronic kidney disease: Secondary | ICD-10-CM | POA: Diagnosis not present

## 2018-04-04 DIAGNOSIS — I129 Hypertensive chronic kidney disease with stage 1 through stage 4 chronic kidney disease, or unspecified chronic kidney disease: Secondary | ICD-10-CM | POA: Diagnosis not present

## 2018-04-04 DIAGNOSIS — I2510 Atherosclerotic heart disease of native coronary artery without angina pectoris: Secondary | ICD-10-CM | POA: Diagnosis not present

## 2018-04-04 DIAGNOSIS — J189 Pneumonia, unspecified organism: Secondary | ICD-10-CM | POA: Diagnosis not present

## 2018-04-04 DIAGNOSIS — R339 Retention of urine, unspecified: Secondary | ICD-10-CM | POA: Diagnosis not present

## 2018-04-05 DIAGNOSIS — R339 Retention of urine, unspecified: Secondary | ICD-10-CM | POA: Diagnosis not present

## 2018-04-05 DIAGNOSIS — E1122 Type 2 diabetes mellitus with diabetic chronic kidney disease: Secondary | ICD-10-CM | POA: Diagnosis not present

## 2018-04-05 DIAGNOSIS — N183 Chronic kidney disease, stage 3 (moderate): Secondary | ICD-10-CM | POA: Diagnosis not present

## 2018-04-05 DIAGNOSIS — I2510 Atherosclerotic heart disease of native coronary artery without angina pectoris: Secondary | ICD-10-CM | POA: Diagnosis not present

## 2018-04-05 DIAGNOSIS — I129 Hypertensive chronic kidney disease with stage 1 through stage 4 chronic kidney disease, or unspecified chronic kidney disease: Secondary | ICD-10-CM | POA: Diagnosis not present

## 2018-04-05 DIAGNOSIS — J189 Pneumonia, unspecified organism: Secondary | ICD-10-CM | POA: Diagnosis not present

## 2018-04-06 DIAGNOSIS — E1122 Type 2 diabetes mellitus with diabetic chronic kidney disease: Secondary | ICD-10-CM | POA: Diagnosis not present

## 2018-04-06 DIAGNOSIS — J189 Pneumonia, unspecified organism: Secondary | ICD-10-CM | POA: Diagnosis not present

## 2018-04-06 DIAGNOSIS — N183 Chronic kidney disease, stage 3 (moderate): Secondary | ICD-10-CM | POA: Diagnosis not present

## 2018-04-06 DIAGNOSIS — R339 Retention of urine, unspecified: Secondary | ICD-10-CM | POA: Diagnosis not present

## 2018-04-06 DIAGNOSIS — I2510 Atherosclerotic heart disease of native coronary artery without angina pectoris: Secondary | ICD-10-CM | POA: Diagnosis not present

## 2018-04-06 DIAGNOSIS — I129 Hypertensive chronic kidney disease with stage 1 through stage 4 chronic kidney disease, or unspecified chronic kidney disease: Secondary | ICD-10-CM | POA: Diagnosis not present

## 2018-04-10 DIAGNOSIS — I129 Hypertensive chronic kidney disease with stage 1 through stage 4 chronic kidney disease, or unspecified chronic kidney disease: Secondary | ICD-10-CM | POA: Diagnosis not present

## 2018-04-10 DIAGNOSIS — E1122 Type 2 diabetes mellitus with diabetic chronic kidney disease: Secondary | ICD-10-CM | POA: Diagnosis not present

## 2018-04-10 DIAGNOSIS — N183 Chronic kidney disease, stage 3 (moderate): Secondary | ICD-10-CM | POA: Diagnosis not present

## 2018-04-10 DIAGNOSIS — J189 Pneumonia, unspecified organism: Secondary | ICD-10-CM | POA: Diagnosis not present

## 2018-04-10 DIAGNOSIS — I2510 Atherosclerotic heart disease of native coronary artery without angina pectoris: Secondary | ICD-10-CM | POA: Diagnosis not present

## 2018-04-10 DIAGNOSIS — R339 Retention of urine, unspecified: Secondary | ICD-10-CM | POA: Diagnosis not present

## 2018-04-11 DIAGNOSIS — I2510 Atherosclerotic heart disease of native coronary artery without angina pectoris: Secondary | ICD-10-CM | POA: Diagnosis not present

## 2018-04-11 DIAGNOSIS — I129 Hypertensive chronic kidney disease with stage 1 through stage 4 chronic kidney disease, or unspecified chronic kidney disease: Secondary | ICD-10-CM | POA: Diagnosis not present

## 2018-04-11 DIAGNOSIS — N183 Chronic kidney disease, stage 3 (moderate): Secondary | ICD-10-CM | POA: Diagnosis not present

## 2018-04-11 DIAGNOSIS — J189 Pneumonia, unspecified organism: Secondary | ICD-10-CM | POA: Diagnosis not present

## 2018-04-11 DIAGNOSIS — E1122 Type 2 diabetes mellitus with diabetic chronic kidney disease: Secondary | ICD-10-CM | POA: Diagnosis not present

## 2018-04-11 DIAGNOSIS — R339 Retention of urine, unspecified: Secondary | ICD-10-CM | POA: Diagnosis not present

## 2018-04-14 DIAGNOSIS — R339 Retention of urine, unspecified: Secondary | ICD-10-CM | POA: Diagnosis not present

## 2018-04-14 DIAGNOSIS — J189 Pneumonia, unspecified organism: Secondary | ICD-10-CM | POA: Diagnosis not present

## 2018-04-14 DIAGNOSIS — I2510 Atherosclerotic heart disease of native coronary artery without angina pectoris: Secondary | ICD-10-CM | POA: Diagnosis not present

## 2018-04-14 DIAGNOSIS — N183 Chronic kidney disease, stage 3 (moderate): Secondary | ICD-10-CM | POA: Diagnosis not present

## 2018-04-14 DIAGNOSIS — I129 Hypertensive chronic kidney disease with stage 1 through stage 4 chronic kidney disease, or unspecified chronic kidney disease: Secondary | ICD-10-CM | POA: Diagnosis not present

## 2018-04-14 DIAGNOSIS — E1122 Type 2 diabetes mellitus with diabetic chronic kidney disease: Secondary | ICD-10-CM | POA: Diagnosis not present

## 2018-04-18 DIAGNOSIS — R339 Retention of urine, unspecified: Secondary | ICD-10-CM | POA: Diagnosis not present

## 2018-04-18 DIAGNOSIS — N183 Chronic kidney disease, stage 3 (moderate): Secondary | ICD-10-CM | POA: Diagnosis not present

## 2018-04-18 DIAGNOSIS — J189 Pneumonia, unspecified organism: Secondary | ICD-10-CM | POA: Diagnosis not present

## 2018-04-18 DIAGNOSIS — I2510 Atherosclerotic heart disease of native coronary artery without angina pectoris: Secondary | ICD-10-CM | POA: Diagnosis not present

## 2018-04-18 DIAGNOSIS — I129 Hypertensive chronic kidney disease with stage 1 through stage 4 chronic kidney disease, or unspecified chronic kidney disease: Secondary | ICD-10-CM | POA: Diagnosis not present

## 2018-04-18 DIAGNOSIS — E1122 Type 2 diabetes mellitus with diabetic chronic kidney disease: Secondary | ICD-10-CM | POA: Diagnosis not present

## 2018-04-25 DIAGNOSIS — I129 Hypertensive chronic kidney disease with stage 1 through stage 4 chronic kidney disease, or unspecified chronic kidney disease: Secondary | ICD-10-CM | POA: Diagnosis not present

## 2018-04-25 DIAGNOSIS — E1122 Type 2 diabetes mellitus with diabetic chronic kidney disease: Secondary | ICD-10-CM | POA: Diagnosis not present

## 2018-04-25 DIAGNOSIS — I2510 Atherosclerotic heart disease of native coronary artery without angina pectoris: Secondary | ICD-10-CM | POA: Diagnosis not present

## 2018-04-25 DIAGNOSIS — R339 Retention of urine, unspecified: Secondary | ICD-10-CM | POA: Diagnosis not present

## 2018-04-25 DIAGNOSIS — J189 Pneumonia, unspecified organism: Secondary | ICD-10-CM | POA: Diagnosis not present

## 2018-04-25 DIAGNOSIS — N183 Chronic kidney disease, stage 3 (moderate): Secondary | ICD-10-CM | POA: Diagnosis not present

## 2018-04-28 DIAGNOSIS — I2510 Atherosclerotic heart disease of native coronary artery without angina pectoris: Secondary | ICD-10-CM | POA: Diagnosis not present

## 2018-04-28 DIAGNOSIS — I129 Hypertensive chronic kidney disease with stage 1 through stage 4 chronic kidney disease, or unspecified chronic kidney disease: Secondary | ICD-10-CM | POA: Diagnosis not present

## 2018-04-28 DIAGNOSIS — J189 Pneumonia, unspecified organism: Secondary | ICD-10-CM | POA: Diagnosis not present

## 2018-04-28 DIAGNOSIS — R339 Retention of urine, unspecified: Secondary | ICD-10-CM | POA: Diagnosis not present

## 2018-04-28 DIAGNOSIS — N183 Chronic kidney disease, stage 3 (moderate): Secondary | ICD-10-CM | POA: Diagnosis not present

## 2018-04-28 DIAGNOSIS — E1122 Type 2 diabetes mellitus with diabetic chronic kidney disease: Secondary | ICD-10-CM | POA: Diagnosis not present

## 2018-05-02 DIAGNOSIS — I2510 Atherosclerotic heart disease of native coronary artery without angina pectoris: Secondary | ICD-10-CM | POA: Diagnosis not present

## 2018-05-02 DIAGNOSIS — J189 Pneumonia, unspecified organism: Secondary | ICD-10-CM | POA: Diagnosis not present

## 2018-05-02 DIAGNOSIS — R339 Retention of urine, unspecified: Secondary | ICD-10-CM | POA: Diagnosis not present

## 2018-05-02 DIAGNOSIS — E1122 Type 2 diabetes mellitus with diabetic chronic kidney disease: Secondary | ICD-10-CM | POA: Diagnosis not present

## 2018-05-02 DIAGNOSIS — I129 Hypertensive chronic kidney disease with stage 1 through stage 4 chronic kidney disease, or unspecified chronic kidney disease: Secondary | ICD-10-CM | POA: Diagnosis not present

## 2018-05-02 DIAGNOSIS — N183 Chronic kidney disease, stage 3 (moderate): Secondary | ICD-10-CM | POA: Diagnosis not present

## 2018-05-04 ENCOUNTER — Other Ambulatory Visit (HOSPITAL_COMMUNITY)
Admission: AD | Admit: 2018-05-04 | Discharge: 2018-05-04 | Disposition: A | Discharge status: Home / Self Care | Payer: Medicare Other | Source: Skilled Nursing Facility | Attending: Pulmonary Disease | Admitting: Pulmonary Disease

## 2018-05-04 DIAGNOSIS — E1122 Type 2 diabetes mellitus with diabetic chronic kidney disease: Secondary | ICD-10-CM | POA: Diagnosis not present

## 2018-05-04 DIAGNOSIS — J189 Pneumonia, unspecified organism: Secondary | ICD-10-CM | POA: Diagnosis not present

## 2018-05-04 DIAGNOSIS — R339 Retention of urine, unspecified: Secondary | ICD-10-CM | POA: Diagnosis not present

## 2018-05-04 DIAGNOSIS — I129 Hypertensive chronic kidney disease with stage 1 through stage 4 chronic kidney disease, or unspecified chronic kidney disease: Secondary | ICD-10-CM | POA: Diagnosis not present

## 2018-05-04 DIAGNOSIS — E119 Type 2 diabetes mellitus without complications: Secondary | ICD-10-CM | POA: Insufficient documentation

## 2018-05-04 DIAGNOSIS — N183 Chronic kidney disease, stage 3 (moderate): Secondary | ICD-10-CM | POA: Diagnosis not present

## 2018-05-04 DIAGNOSIS — I2510 Atherosclerotic heart disease of native coronary artery without angina pectoris: Secondary | ICD-10-CM | POA: Diagnosis not present

## 2018-05-04 LAB — COMPREHENSIVE METABOLIC PANEL
ALBUMIN: 3.4 g/dL — AB (ref 3.5–5.0)
ALT: 14 U/L (ref 0–44)
AST: 13 U/L — ABNORMAL LOW (ref 15–41)
Alkaline Phosphatase: 95 U/L (ref 38–126)
Anion gap: 8 (ref 5–15)
BUN: 35 mg/dL — ABNORMAL HIGH (ref 8–23)
CO2: 20 mmol/L — ABNORMAL LOW (ref 22–32)
Calcium: 9 mg/dL (ref 8.9–10.3)
Chloride: 108 mmol/L (ref 98–111)
Creatinine, Ser: 1.48 mg/dL — ABNORMAL HIGH (ref 0.44–1.00)
GFR calc Af Amer: 36 mL/min — ABNORMAL LOW (ref >60)
GFR calc non Af Amer: 31 mL/min — ABNORMAL LOW (ref >60)
Glucose, Bld: 250 mg/dL — ABNORMAL HIGH (ref 70–99)
POTASSIUM: 5.9 mmol/L — AB (ref 3.5–5.1)
Sodium: 136 mmol/L (ref 135–145)
Total Bilirubin: 0.4 mg/dL (ref 0.3–1.2)
Total Protein: 6.1 g/dL — ABNORMAL LOW (ref 6.5–8.1)

## 2018-05-04 LAB — HEMOGLOBIN A1C
Hgb A1c MFr Bld: 9.6 % — ABNORMAL HIGH (ref 4.8–5.6)
Mean Plasma Glucose: 228.82 mg/dL

## 2018-05-09 DIAGNOSIS — E1122 Type 2 diabetes mellitus with diabetic chronic kidney disease: Secondary | ICD-10-CM | POA: Diagnosis not present

## 2018-05-09 DIAGNOSIS — I129 Hypertensive chronic kidney disease with stage 1 through stage 4 chronic kidney disease, or unspecified chronic kidney disease: Secondary | ICD-10-CM | POA: Diagnosis not present

## 2018-05-09 DIAGNOSIS — N183 Chronic kidney disease, stage 3 (moderate): Secondary | ICD-10-CM | POA: Diagnosis not present

## 2018-05-09 DIAGNOSIS — R339 Retention of urine, unspecified: Secondary | ICD-10-CM | POA: Diagnosis not present

## 2018-05-09 DIAGNOSIS — I2510 Atherosclerotic heart disease of native coronary artery without angina pectoris: Secondary | ICD-10-CM | POA: Diagnosis not present

## 2018-05-09 DIAGNOSIS — J189 Pneumonia, unspecified organism: Secondary | ICD-10-CM | POA: Diagnosis not present

## 2018-05-11 DIAGNOSIS — E1122 Type 2 diabetes mellitus with diabetic chronic kidney disease: Secondary | ICD-10-CM | POA: Diagnosis not present

## 2018-05-11 DIAGNOSIS — N183 Chronic kidney disease, stage 3 (moderate): Secondary | ICD-10-CM | POA: Diagnosis not present

## 2018-05-11 DIAGNOSIS — R339 Retention of urine, unspecified: Secondary | ICD-10-CM | POA: Diagnosis not present

## 2018-05-11 DIAGNOSIS — J189 Pneumonia, unspecified organism: Secondary | ICD-10-CM | POA: Diagnosis not present

## 2018-05-11 DIAGNOSIS — I2510 Atherosclerotic heart disease of native coronary artery without angina pectoris: Secondary | ICD-10-CM | POA: Diagnosis not present

## 2018-05-11 DIAGNOSIS — I129 Hypertensive chronic kidney disease with stage 1 through stage 4 chronic kidney disease, or unspecified chronic kidney disease: Secondary | ICD-10-CM | POA: Diagnosis not present

## 2018-05-13 DIAGNOSIS — Z792 Long term (current) use of antibiotics: Secondary | ICD-10-CM | POA: Diagnosis not present

## 2018-05-13 DIAGNOSIS — E1122 Type 2 diabetes mellitus with diabetic chronic kidney disease: Secondary | ICD-10-CM | POA: Diagnosis not present

## 2018-05-13 DIAGNOSIS — R339 Retention of urine, unspecified: Secondary | ICD-10-CM | POA: Diagnosis not present

## 2018-05-13 DIAGNOSIS — F0390 Unspecified dementia without behavioral disturbance: Secondary | ICD-10-CM | POA: Diagnosis not present

## 2018-05-13 DIAGNOSIS — Z794 Long term (current) use of insulin: Secondary | ICD-10-CM | POA: Diagnosis not present

## 2018-05-13 DIAGNOSIS — N183 Chronic kidney disease, stage 3 (moderate): Secondary | ICD-10-CM | POA: Diagnosis not present

## 2018-05-13 DIAGNOSIS — J189 Pneumonia, unspecified organism: Secondary | ICD-10-CM | POA: Diagnosis not present

## 2018-05-13 DIAGNOSIS — Z87440 Personal history of urinary (tract) infections: Secondary | ICD-10-CM | POA: Diagnosis not present

## 2018-05-13 DIAGNOSIS — Z7982 Long term (current) use of aspirin: Secondary | ICD-10-CM | POA: Diagnosis not present

## 2018-05-13 DIAGNOSIS — I129 Hypertensive chronic kidney disease with stage 1 through stage 4 chronic kidney disease, or unspecified chronic kidney disease: Secondary | ICD-10-CM | POA: Diagnosis not present

## 2018-05-13 DIAGNOSIS — R1310 Dysphagia, unspecified: Secondary | ICD-10-CM | POA: Diagnosis not present

## 2018-05-13 DIAGNOSIS — I2510 Atherosclerotic heart disease of native coronary artery without angina pectoris: Secondary | ICD-10-CM | POA: Diagnosis not present

## 2018-05-13 DIAGNOSIS — I69391 Dysphagia following cerebral infarction: Secondary | ICD-10-CM | POA: Diagnosis not present

## 2018-05-15 ENCOUNTER — Other Ambulatory Visit (HOSPITAL_COMMUNITY)
Admission: AD | Admit: 2018-05-15 | Discharge: 2018-05-15 | Disposition: A | Discharge status: Home / Self Care | Payer: Medicare Other | Source: Skilled Nursing Facility | Attending: Pulmonary Disease | Admitting: Pulmonary Disease

## 2018-05-15 DIAGNOSIS — J189 Pneumonia, unspecified organism: Secondary | ICD-10-CM | POA: Diagnosis not present

## 2018-05-15 DIAGNOSIS — N183 Chronic kidney disease, stage 3 (moderate): Secondary | ICD-10-CM | POA: Diagnosis not present

## 2018-05-15 DIAGNOSIS — I129 Hypertensive chronic kidney disease with stage 1 through stage 4 chronic kidney disease, or unspecified chronic kidney disease: Secondary | ICD-10-CM | POA: Diagnosis not present

## 2018-05-15 DIAGNOSIS — I10 Essential (primary) hypertension: Secondary | ICD-10-CM | POA: Insufficient documentation

## 2018-05-15 DIAGNOSIS — R339 Retention of urine, unspecified: Secondary | ICD-10-CM | POA: Diagnosis not present

## 2018-05-15 DIAGNOSIS — E1122 Type 2 diabetes mellitus with diabetic chronic kidney disease: Secondary | ICD-10-CM | POA: Diagnosis not present

## 2018-05-15 DIAGNOSIS — I2510 Atherosclerotic heart disease of native coronary artery without angina pectoris: Secondary | ICD-10-CM | POA: Diagnosis not present

## 2018-05-15 LAB — BASIC METABOLIC PANEL
Anion gap: 9 (ref 5–15)
BUN: 28 mg/dL — ABNORMAL HIGH (ref 8–23)
CO2: 22 mmol/L (ref 22–32)
Calcium: 8.9 mg/dL (ref 8.9–10.3)
Chloride: 106 mmol/L (ref 98–111)
Creatinine, Ser: 1.72 mg/dL — ABNORMAL HIGH (ref 0.44–1.00)
GFR calc Af Amer: 30 mL/min — ABNORMAL LOW (ref >60)
GFR, EST NON AFRICAN AMERICAN: 26 mL/min — AB (ref >60)
Glucose, Bld: 229 mg/dL — ABNORMAL HIGH (ref 70–99)
Potassium: 5 mmol/L (ref 3.5–5.1)
SODIUM: 137 mmol/L (ref 135–145)

## 2018-05-19 DIAGNOSIS — I2510 Atherosclerotic heart disease of native coronary artery without angina pectoris: Secondary | ICD-10-CM | POA: Diagnosis not present

## 2018-05-19 DIAGNOSIS — N183 Chronic kidney disease, stage 3 (moderate): Secondary | ICD-10-CM | POA: Diagnosis not present

## 2018-05-19 DIAGNOSIS — J189 Pneumonia, unspecified organism: Secondary | ICD-10-CM | POA: Diagnosis not present

## 2018-05-19 DIAGNOSIS — E1122 Type 2 diabetes mellitus with diabetic chronic kidney disease: Secondary | ICD-10-CM | POA: Diagnosis not present

## 2018-05-19 DIAGNOSIS — R339 Retention of urine, unspecified: Secondary | ICD-10-CM | POA: Diagnosis not present

## 2018-05-19 DIAGNOSIS — I129 Hypertensive chronic kidney disease with stage 1 through stage 4 chronic kidney disease, or unspecified chronic kidney disease: Secondary | ICD-10-CM | POA: Diagnosis not present

## 2018-05-23 DIAGNOSIS — R339 Retention of urine, unspecified: Secondary | ICD-10-CM | POA: Diagnosis not present

## 2018-05-23 DIAGNOSIS — I2510 Atherosclerotic heart disease of native coronary artery without angina pectoris: Secondary | ICD-10-CM | POA: Diagnosis not present

## 2018-05-23 DIAGNOSIS — J189 Pneumonia, unspecified organism: Secondary | ICD-10-CM | POA: Diagnosis not present

## 2018-05-23 DIAGNOSIS — I129 Hypertensive chronic kidney disease with stage 1 through stage 4 chronic kidney disease, or unspecified chronic kidney disease: Secondary | ICD-10-CM | POA: Diagnosis not present

## 2018-05-23 DIAGNOSIS — N183 Chronic kidney disease, stage 3 (moderate): Secondary | ICD-10-CM | POA: Diagnosis not present

## 2018-05-23 DIAGNOSIS — E1122 Type 2 diabetes mellitus with diabetic chronic kidney disease: Secondary | ICD-10-CM | POA: Diagnosis not present

## 2018-05-25 DIAGNOSIS — J189 Pneumonia, unspecified organism: Secondary | ICD-10-CM | POA: Diagnosis not present

## 2018-05-25 DIAGNOSIS — I2510 Atherosclerotic heart disease of native coronary artery without angina pectoris: Secondary | ICD-10-CM | POA: Diagnosis not present

## 2018-05-25 DIAGNOSIS — I129 Hypertensive chronic kidney disease with stage 1 through stage 4 chronic kidney disease, or unspecified chronic kidney disease: Secondary | ICD-10-CM | POA: Diagnosis not present

## 2018-05-25 DIAGNOSIS — E1122 Type 2 diabetes mellitus with diabetic chronic kidney disease: Secondary | ICD-10-CM | POA: Diagnosis not present

## 2018-05-25 DIAGNOSIS — N183 Chronic kidney disease, stage 3 (moderate): Secondary | ICD-10-CM | POA: Diagnosis not present

## 2018-05-25 DIAGNOSIS — R339 Retention of urine, unspecified: Secondary | ICD-10-CM | POA: Diagnosis not present

## 2018-05-29 DIAGNOSIS — J189 Pneumonia, unspecified organism: Secondary | ICD-10-CM | POA: Diagnosis not present

## 2018-05-29 DIAGNOSIS — I129 Hypertensive chronic kidney disease with stage 1 through stage 4 chronic kidney disease, or unspecified chronic kidney disease: Secondary | ICD-10-CM | POA: Diagnosis not present

## 2018-05-29 DIAGNOSIS — I2510 Atherosclerotic heart disease of native coronary artery without angina pectoris: Secondary | ICD-10-CM | POA: Diagnosis not present

## 2018-05-29 DIAGNOSIS — N183 Chronic kidney disease, stage 3 (moderate): Secondary | ICD-10-CM | POA: Diagnosis not present

## 2018-05-29 DIAGNOSIS — E1122 Type 2 diabetes mellitus with diabetic chronic kidney disease: Secondary | ICD-10-CM | POA: Diagnosis not present

## 2018-05-29 DIAGNOSIS — R339 Retention of urine, unspecified: Secondary | ICD-10-CM | POA: Diagnosis not present

## 2018-05-30 DIAGNOSIS — E1165 Type 2 diabetes mellitus with hyperglycemia: Secondary | ICD-10-CM | POA: Diagnosis not present

## 2018-05-30 DIAGNOSIS — M25561 Pain in right knee: Secondary | ICD-10-CM | POA: Diagnosis not present

## 2018-05-30 DIAGNOSIS — F0390 Unspecified dementia without behavioral disturbance: Secondary | ICD-10-CM | POA: Diagnosis not present

## 2018-05-30 DIAGNOSIS — I10 Essential (primary) hypertension: Secondary | ICD-10-CM | POA: Diagnosis not present

## 2018-06-05 DIAGNOSIS — R82998 Other abnormal findings in urine: Secondary | ICD-10-CM | POA: Diagnosis not present

## 2018-06-05 DIAGNOSIS — R8290 Unspecified abnormal findings in urine: Secondary | ICD-10-CM | POA: Diagnosis not present

## 2018-06-06 DIAGNOSIS — J189 Pneumonia, unspecified organism: Secondary | ICD-10-CM | POA: Diagnosis not present

## 2018-06-06 DIAGNOSIS — I2510 Atherosclerotic heart disease of native coronary artery without angina pectoris: Secondary | ICD-10-CM | POA: Diagnosis not present

## 2018-06-06 DIAGNOSIS — N183 Chronic kidney disease, stage 3 (moderate): Secondary | ICD-10-CM | POA: Diagnosis not present

## 2018-06-06 DIAGNOSIS — I129 Hypertensive chronic kidney disease with stage 1 through stage 4 chronic kidney disease, or unspecified chronic kidney disease: Secondary | ICD-10-CM | POA: Diagnosis not present

## 2018-06-06 DIAGNOSIS — R339 Retention of urine, unspecified: Secondary | ICD-10-CM | POA: Diagnosis not present

## 2018-06-06 DIAGNOSIS — E1122 Type 2 diabetes mellitus with diabetic chronic kidney disease: Secondary | ICD-10-CM | POA: Diagnosis not present

## 2018-06-22 DIAGNOSIS — M79675 Pain in left toe(s): Secondary | ICD-10-CM | POA: Diagnosis not present

## 2018-06-22 DIAGNOSIS — M79674 Pain in right toe(s): Secondary | ICD-10-CM | POA: Diagnosis not present

## 2018-06-22 DIAGNOSIS — B351 Tinea unguium: Secondary | ICD-10-CM | POA: Diagnosis not present

## 2018-08-09 DIAGNOSIS — Z03818 Encounter for observation for suspected exposure to other biological agents ruled out: Secondary | ICD-10-CM | POA: Diagnosis not present

## 2018-09-07 DIAGNOSIS — B351 Tinea unguium: Secondary | ICD-10-CM | POA: Diagnosis not present

## 2018-09-07 DIAGNOSIS — E1165 Type 2 diabetes mellitus with hyperglycemia: Secondary | ICD-10-CM | POA: Diagnosis not present

## 2018-09-07 DIAGNOSIS — M79675 Pain in left toe(s): Secondary | ICD-10-CM | POA: Diagnosis not present

## 2018-09-07 DIAGNOSIS — I10 Essential (primary) hypertension: Secondary | ICD-10-CM | POA: Diagnosis not present

## 2018-09-07 DIAGNOSIS — M79674 Pain in right toe(s): Secondary | ICD-10-CM | POA: Diagnosis not present

## 2018-09-07 DIAGNOSIS — J301 Allergic rhinitis due to pollen: Secondary | ICD-10-CM | POA: Diagnosis not present

## 2018-09-07 DIAGNOSIS — F0390 Unspecified dementia without behavioral disturbance: Secondary | ICD-10-CM | POA: Diagnosis not present

## 2018-10-15 IMAGING — DX DG SHOULDER 2+V*L*
2 series · 2 of 2 positions shown · non-contrast
Comparison: None.

CLINICAL DATA: Fall, history of arthritis

EXAM:
LEFT SHOULDER - 2+ VIEW

[shoulder y view]
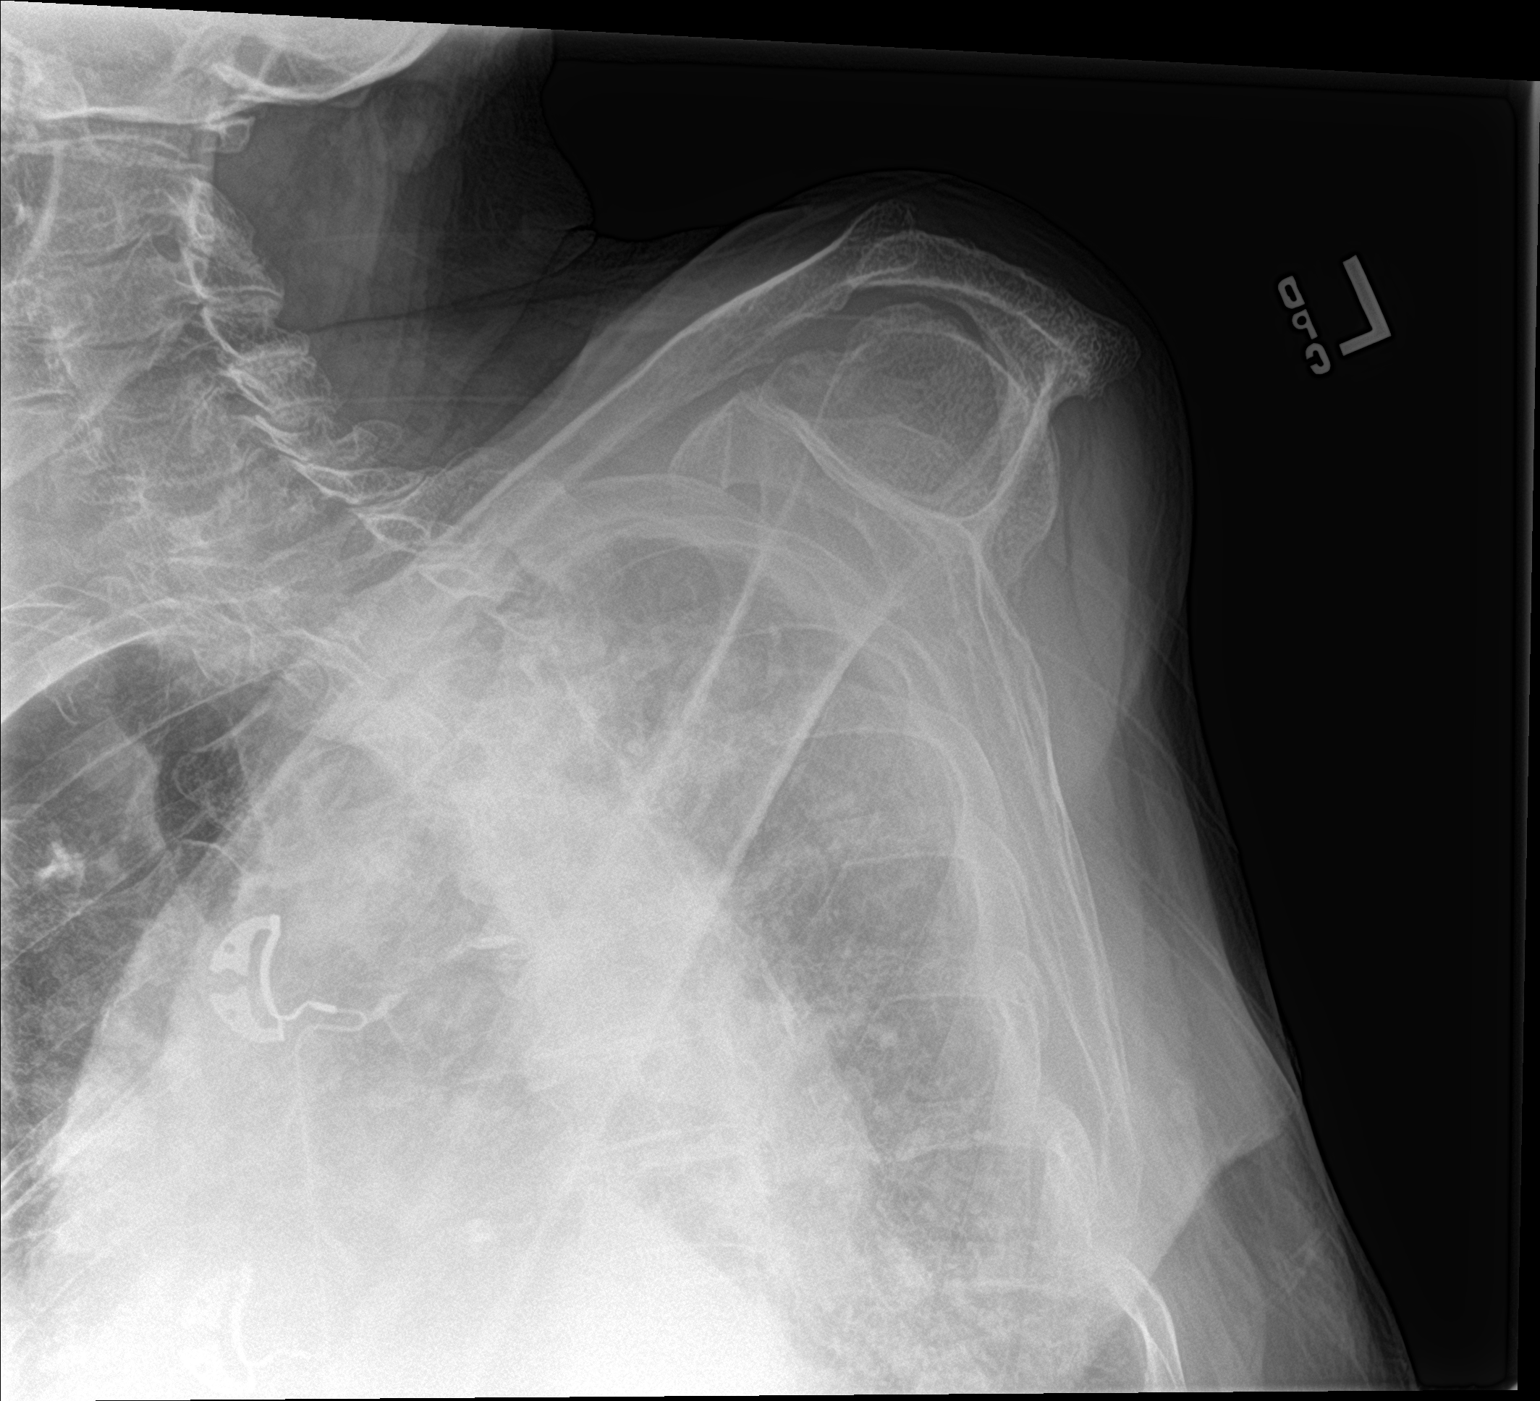

[shoulder grashey]
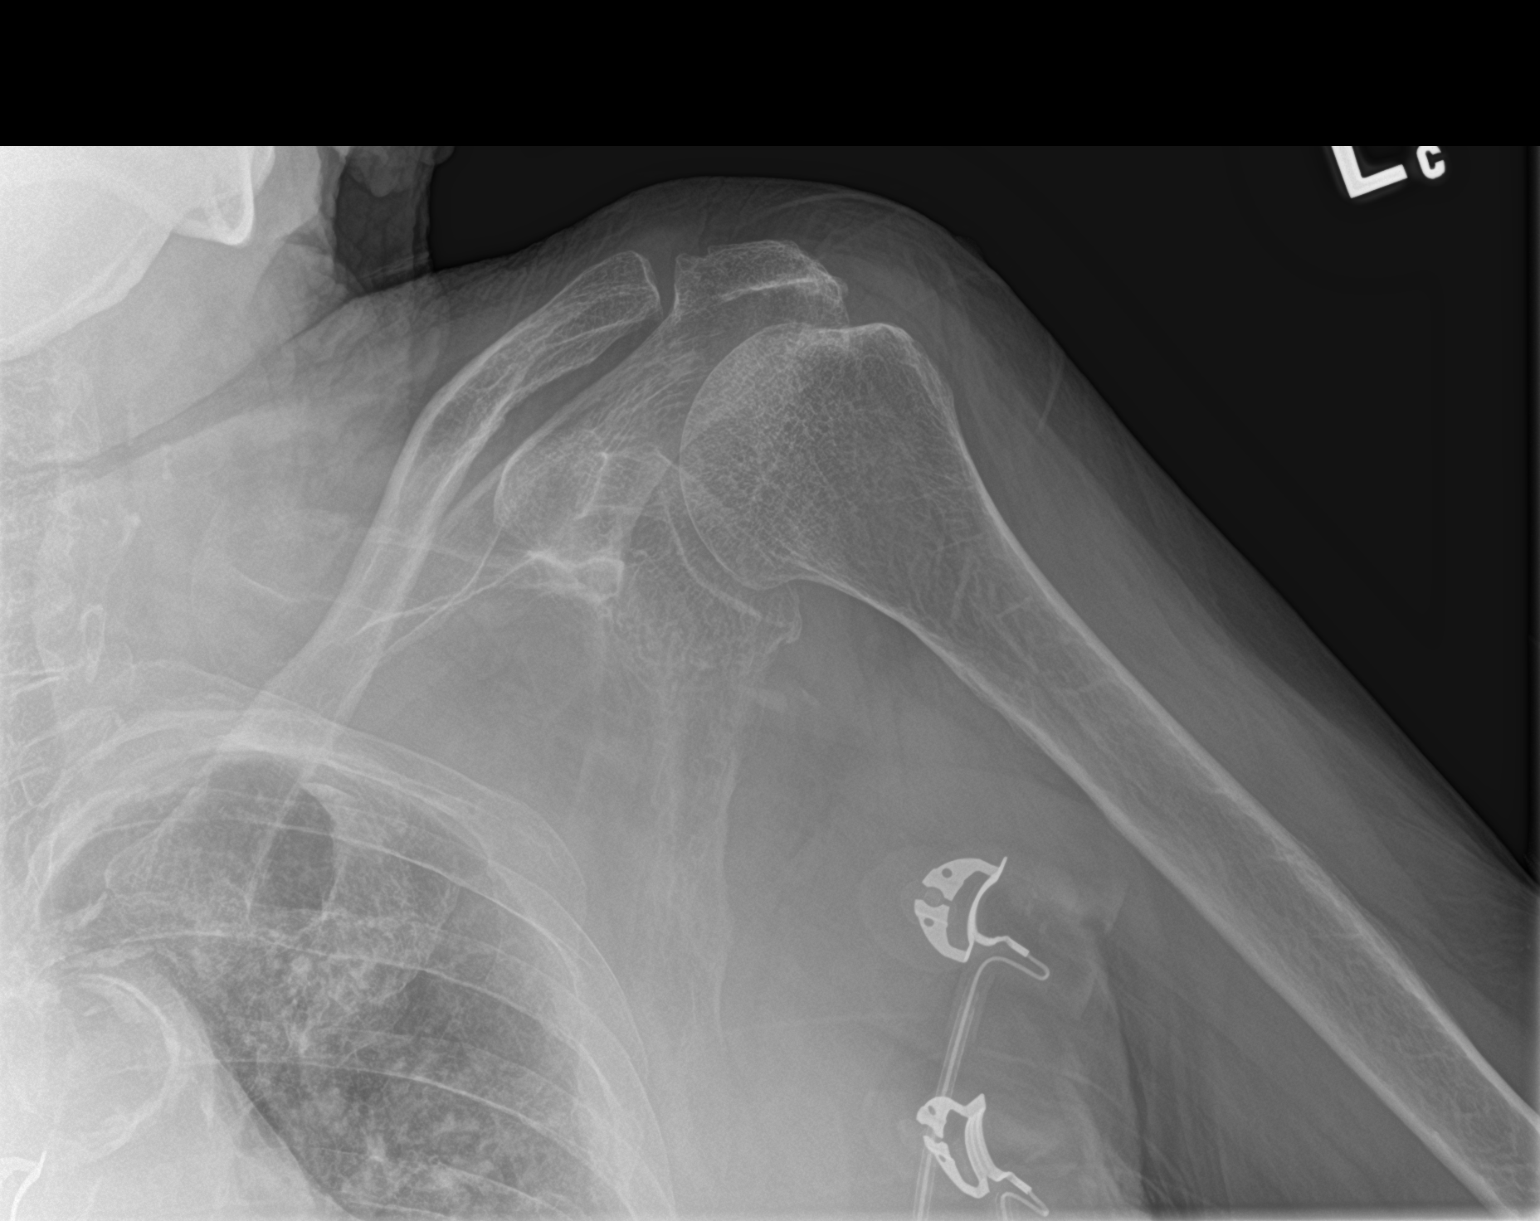

[2 of 2 positions shown; findings below may reference images not displayed]

FINDINGS: No acute displaced fracture or malalignment. AC joint degenerative
change and minimal inferior glenohumeral degenerative change.
IMPRESSION: Minimal degenerative changes.  No acute osseous abnormality

## 2018-10-15 IMAGING — DX DG SHOULDER 2+V*R*
2 series · 2 of 2 positions shown · non-contrast
Comparison: None.

CLINICAL DATA: Fall, pain in the shoulders

EXAM:
RIGHT SHOULDER - 2+ VIEW

[shoulder grashey]
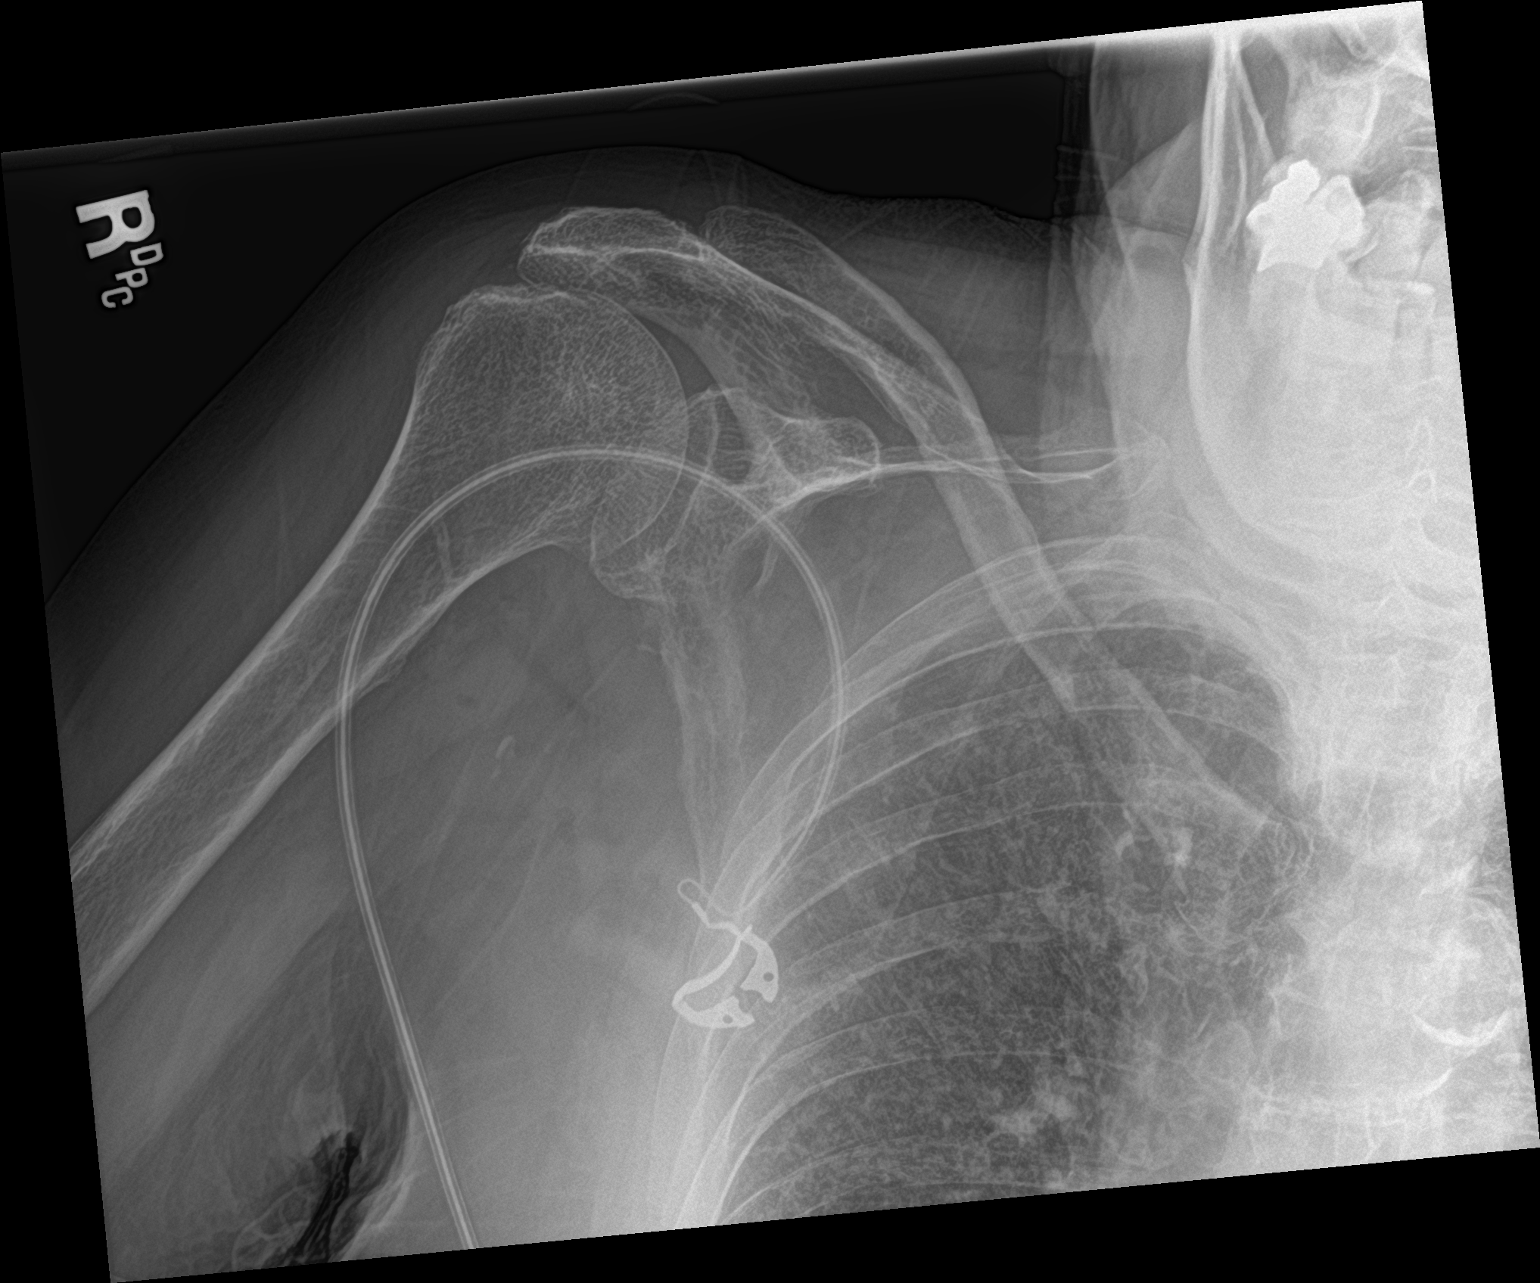

[shoulder y view]
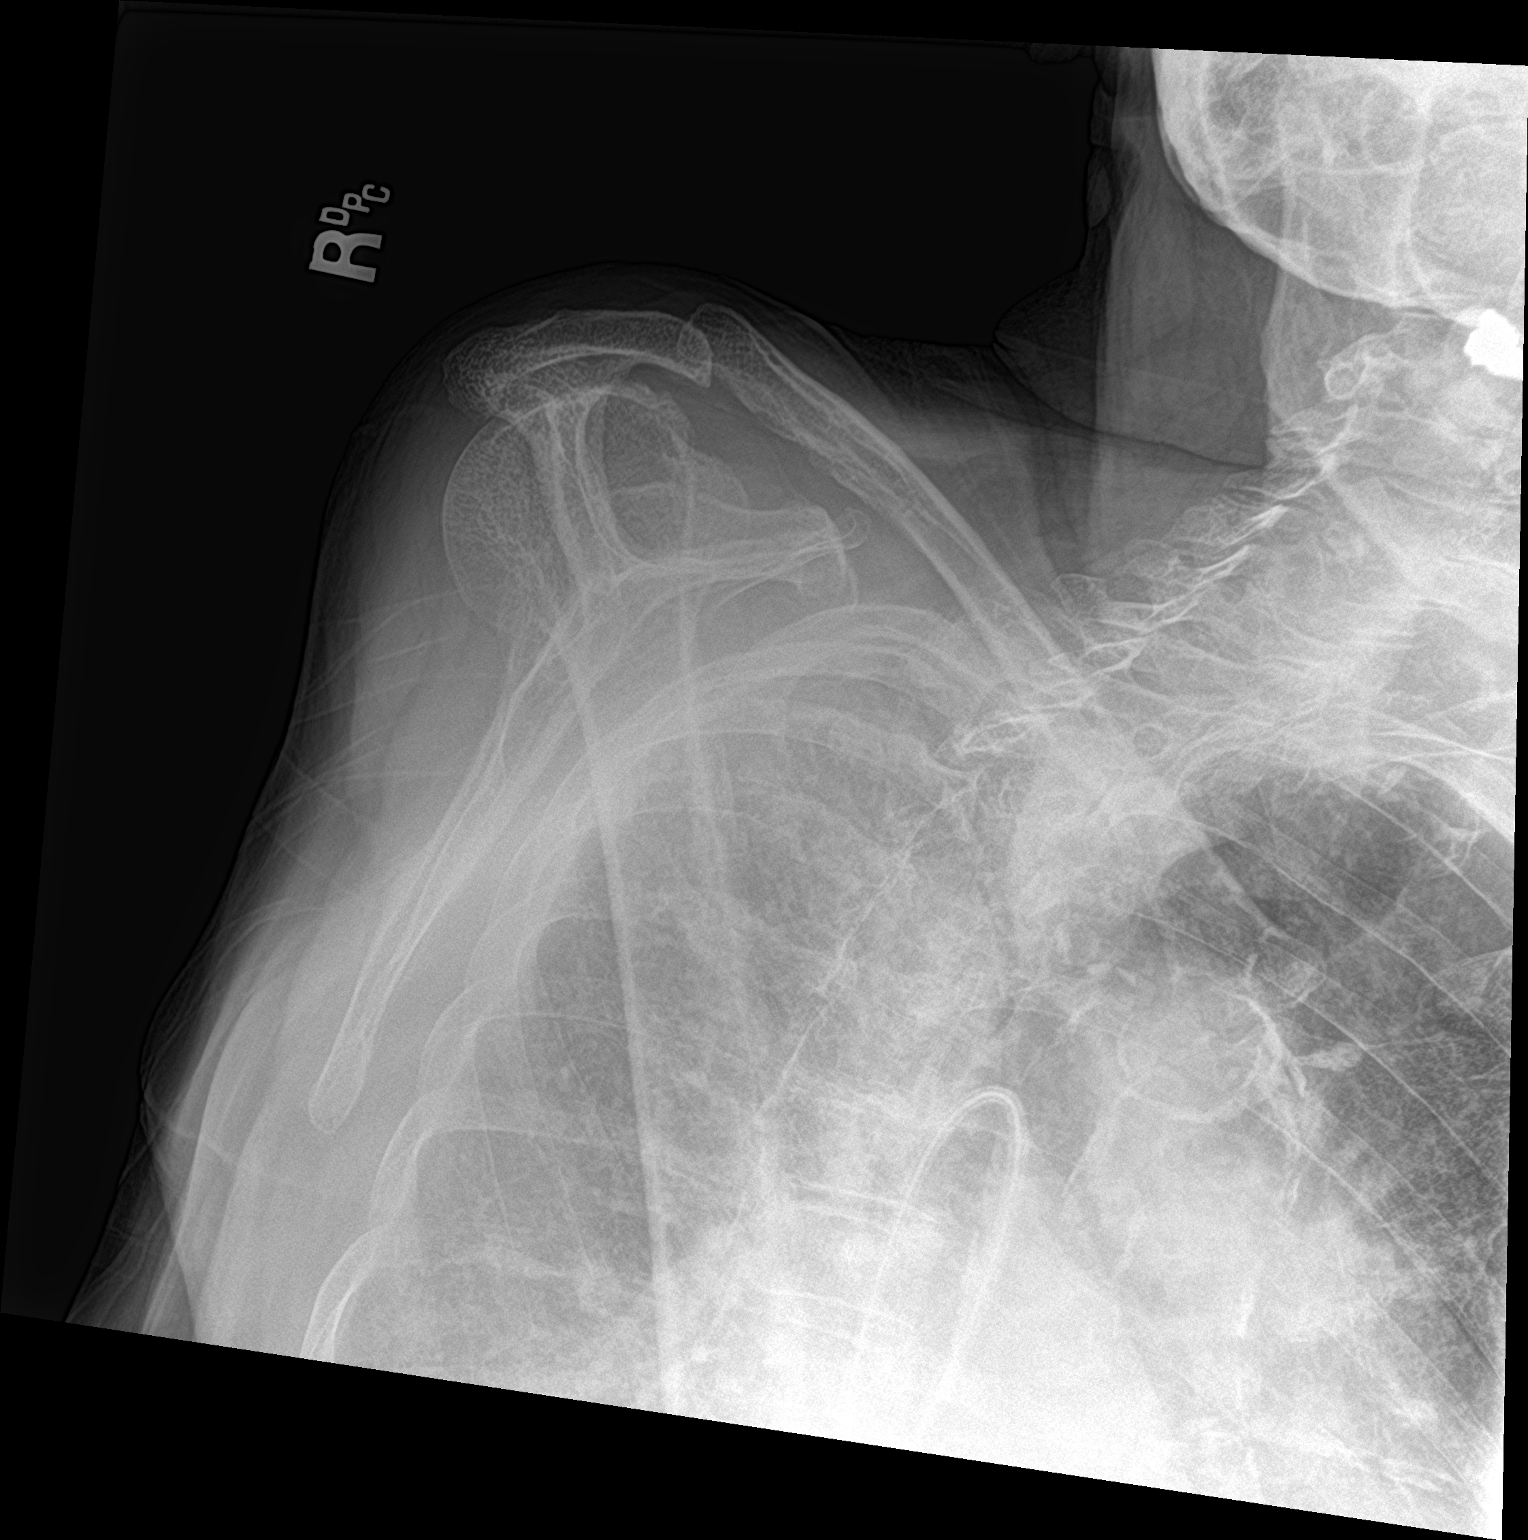

[2 of 2 positions shown; findings below may reference images not displayed]

FINDINGS: No fracture or dislocation. Mild inferior glenohumeral degenerative
change. Narrowing of the subacromial space. Right lung apex is clear
IMPRESSION: 1. No fracture or dislocation
2. Narrowed appearance of the subacromial space suggesting rotator
cuff pathology

## 2018-11-15 DIAGNOSIS — B351 Tinea unguium: Secondary | ICD-10-CM | POA: Diagnosis not present

## 2018-11-15 DIAGNOSIS — M79674 Pain in right toe(s): Secondary | ICD-10-CM | POA: Diagnosis not present

## 2018-11-15 DIAGNOSIS — M79675 Pain in left toe(s): Secondary | ICD-10-CM | POA: Diagnosis not present

## 2018-12-07 DIAGNOSIS — F0390 Unspecified dementia without behavioral disturbance: Secondary | ICD-10-CM | POA: Diagnosis not present

## 2018-12-07 DIAGNOSIS — R21 Rash and other nonspecific skin eruption: Secondary | ICD-10-CM | POA: Diagnosis not present

## 2018-12-07 DIAGNOSIS — E1165 Type 2 diabetes mellitus with hyperglycemia: Secondary | ICD-10-CM | POA: Diagnosis not present

## 2018-12-07 DIAGNOSIS — I10 Essential (primary) hypertension: Secondary | ICD-10-CM | POA: Diagnosis not present

## 2018-12-16 DIAGNOSIS — Z23 Encounter for immunization: Secondary | ICD-10-CM | POA: Diagnosis not present

## 2018-12-20 DIAGNOSIS — Z03818 Encounter for observation for suspected exposure to other biological agents ruled out: Secondary | ICD-10-CM | POA: Diagnosis not present

## 2018-12-27 DIAGNOSIS — E1165 Type 2 diabetes mellitus with hyperglycemia: Secondary | ICD-10-CM | POA: Diagnosis not present

## 2018-12-27 DIAGNOSIS — I10 Essential (primary) hypertension: Secondary | ICD-10-CM | POA: Diagnosis not present

## 2018-12-27 DIAGNOSIS — Z03818 Encounter for observation for suspected exposure to other biological agents ruled out: Secondary | ICD-10-CM | POA: Diagnosis not present

## 2018-12-27 DIAGNOSIS — B029 Zoster without complications: Secondary | ICD-10-CM | POA: Diagnosis not present

## 2019-01-10 DIAGNOSIS — E1165 Type 2 diabetes mellitus with hyperglycemia: Secondary | ICD-10-CM | POA: Diagnosis not present

## 2019-01-10 DIAGNOSIS — F0390 Unspecified dementia without behavioral disturbance: Secondary | ICD-10-CM | POA: Diagnosis not present

## 2019-01-10 DIAGNOSIS — I10 Essential (primary) hypertension: Secondary | ICD-10-CM | POA: Diagnosis not present

## 2019-01-10 DIAGNOSIS — B029 Zoster without complications: Secondary | ICD-10-CM | POA: Diagnosis not present

## 2019-01-12 DIAGNOSIS — Z03818 Encounter for observation for suspected exposure to other biological agents ruled out: Secondary | ICD-10-CM | POA: Diagnosis not present

## 2019-01-17 DIAGNOSIS — Z03818 Encounter for observation for suspected exposure to other biological agents ruled out: Secondary | ICD-10-CM | POA: Diagnosis not present

## 2019-01-23 DIAGNOSIS — M79674 Pain in right toe(s): Secondary | ICD-10-CM | POA: Diagnosis not present

## 2019-01-23 DIAGNOSIS — B351 Tinea unguium: Secondary | ICD-10-CM | POA: Diagnosis not present

## 2019-01-23 DIAGNOSIS — M79675 Pain in left toe(s): Secondary | ICD-10-CM | POA: Diagnosis not present

## 2019-01-24 DIAGNOSIS — Z03818 Encounter for observation for suspected exposure to other biological agents ruled out: Secondary | ICD-10-CM | POA: Diagnosis not present

## 2019-02-22 DIAGNOSIS — Z03818 Encounter for observation for suspected exposure to other biological agents ruled out: Secondary | ICD-10-CM | POA: Diagnosis not present

## 2019-03-14 ENCOUNTER — Ambulatory Visit (INDEPENDENT_AMBULATORY_CARE_PROVIDER_SITE_OTHER): Payer: Medicare Other | Admitting: Internal Medicine

## 2019-03-14 ENCOUNTER — Encounter (INDEPENDENT_AMBULATORY_CARE_PROVIDER_SITE_OTHER): Payer: Self-pay | Admitting: Internal Medicine

## 2019-03-14 VITALS — BP 140/78 | HR 72 | Temp 97.2°F | Ht 63.0 in | Wt 178.4 lb

## 2019-03-14 DIAGNOSIS — N3000 Acute cystitis without hematuria: Secondary | ICD-10-CM

## 2019-03-14 NOTE — Progress Notes (Signed)
Metrics: Intervention Frequency ACO  Documented Smoking Status Yearly  Screened one or more times in 24 months  Cessation Counseling or  Active cessation medication Past 24 months  Past 24 months   Guideline developer: UpToDate (See UpToDate for funding source) Date Released: 2014       Wellness Office Visit  Subjective:  Patient ID: Regina Bush, female    DOB: 07-06-1927  Age: 85 y.o. MRN: [id number]  CC: This is an audio telemedicine visit with the permission of the patient who is in [facility] living which is an assisted living facility I am in my office.  This is my first meeting with her. Her main complaint has been of dysuria and foul-smelling urine. HPI  She has a history of urinary tract infections and if treatment is left somewhat later, she tends to get very confused and has on occasion been admitted to the hospital.  She was started on antibiotics this morning for a presumed UTI and says she is feeling well.  She denies any nausea, vomiting, abdominal pain and there is no fever. Past Medical History:  Diagnosis Date  . Anemia   . Arthritis   . Cerebral infarction ([place])   . Coronary artery disease   . Dementia ([place])   . Diabetes mellitus without complication (HCC)    Type 2  . Dysphagia due to recent cerebral infarction   . Failure to thrive (0-17)   . Hypercalcemia   . Hypertension   . Pneumonitis   . Protein calorie malnutrition ([place])   . Subdural hematoma ([place])   . Thyroid disease       Family History  Family history unknown: Yes    Social History   Social History Narrative   Widow,husband passed away 2020,married for 57 years.Retired.   Social History   Tobacco Use  . Smoking status: Never Smoker  . Smokeless tobacco: Never Used  Substance Use Topics  . Alcohol use: No    Current Meds  Medication Sig  . acetaminophen (TYLENOL) 325 MG tablet Take 2 tablets (650 mg total) by mouth every 6 (six) hours as needed for mild pain or moderate  pain.  Marland Kitchen amLODipine (NORVASC) 10 MG tablet Take 10 mg by mouth daily.   Marland Kitchen aspirin EC 81 MG tablet Take 81 mg by mouth daily.  Marland Kitchen atorvastatin (LIPITOR) 20 MG tablet Take 20 mg by mouth daily.   . cetirizine (ZYRTEC) 10 MG tablet Take 10 mg by mouth at bedtime as needed for allergies.  . cholecalciferol (VITAMIN D) 1000 units tablet Take 1,000 Units by mouth daily.   . clotrimazole-betamethasone (LOTRISONE) cream Apply 1 application topically 2 (two) times daily. Apply 2 grams to affected areas daily until resolved  . famotidine (PEPCID) 20 MG tablet Take 20 mg by mouth daily.   . fluticasone (FLONASE) 50 MCG/ACT nasal spray Place 2 sprays into both nostrils daily.  . hydrALAZINE (APRESOLINE) 25 MG tablet Take 25 mg by mouth 3 (three) times daily. Give 1 tablet by mouth three times a day for HTN hold for systolic BP <010  . insulin glargine (LANTUS) 100 UNIT/ML injection Inject 0.12 mLs (12 Units total) into the skin daily. (Patient taking differently: Inject 16 Units into the skin daily. )  . ketoconazole (NIZORAL) 2 % cream Apply 1 application topically daily. Apply to abdominal folds under breast, groin area and sacrum area where redness and rash is located report to provider if no improvements is noted.  Marland Kitchen levothyroxine (SYNTHROID, LEVOTHROID) 50  MCG tablet Take 50 mcg by mouth daily before breakfast.   . Magnesium 400 MG TABS Take 1 tablet by mouth daily.  . magnesium oxide (MAG-OX) 400 MG tablet Take 400 mg by mouth daily.   . Multiple Vitamin (MULTIVITAMIN WITH MINERALS) TABS tablet Take 1 tablet by mouth daily.   Marland Kitchen sulfamethoxazole-trimethoprim (BACTRIM DS) 800-160 MG tablet Take 1 tablet by mouth 2 (two) times daily.  . tamsulosin (FLOMAX) 0.4 MG CAPS capsule Take 0.4 mg by mouth every evening.  . traZODone (DESYREL) 50 MG tablet Take 50 mg by mouth at bedtime.  . Wheat Dextrin (BENEFIBER PO) Take 10 mLs by mouth daily.   . [DISCONTINUED] dextromethorphan-guaiFENesin (ROBITUSSIN-DM) 10-100  MG/5ML liquid Take 5 mLs by mouth every 4 (four) hours as needed for cough. Give 5cc by mouth every 4 hours as needed for cough.  . [DISCONTINUED] dextrose (GLUTOSE 15) 40 % GEL Take 1 Tube by mouth. Give 1 application by mouth as needed for blood sugar less than 60 recheck blood sugar in 15 minutes; notify MD if blood sugar doesn't increase.  . [DISCONTINUED] feeding supplement, ENSURE ENLIVE, (ENSURE ENLIVE) LIQD Take 237 mLs by mouth 2 (two) times daily between meals.  . [DISCONTINUED] fluconazole (DIFLUCAN) 200 MG tablet Take 200 mg by mouth daily. For 5 days from 03/10/2018-03/14/2018  . [DISCONTINUED] trimethoprim (TRIMPEX) 100 MG tablet Take 100 mg by mouth daily. Give 1 tablet by mouth one time a day for preventative.      Objective:   Today's Vitals: BP 140/78   Pulse 72   Temp (!) 97.2 F (36.2 C)   Ht 5\' 3"  (1.6 m)   Wt 178 lb 6.4 oz (80.9 kg)   SpO2 97%   BMI 31.60 kg/m  Vitals with BMI 03/14/2019 03/13/2018 03/09/2018  Height 5\' 3"  - -  Weight 178 lbs 6 oz - -  BMI 56.81 - -  Systolic 275 170 017  Diastolic 78 57 77  Pulse 72 56 66     Physical Exam    Vital signs have been recorded at the assisted living facility and are documented above.  She is afebrile.  She is alert and orientated on video.   Assessment   1. Acute cystitis without hematuria       Tests ordered No orders of the defined types were placed in this encounter.    Plan: 1. She will continue with her course of Bactrim and finish this.  She has tolerated this in the past. 2. I will see her in February when she will actually come into the office and we will evaluate her chronic conditions then.   No orders of the defined types were placed in this encounter.   Doree Albee, MD

## 2019-03-18 ENCOUNTER — Other Ambulatory Visit: Payer: Self-pay

## 2019-03-18 ENCOUNTER — Inpatient Hospital Stay (HOSPITAL_COMMUNITY)
Admission: EM | Admit: 2019-03-18 | Discharge: 2019-03-22 | DRG: 193 | Disposition: A | Discharge status: Skilled Nursing Facility | Payer: Medicare Other | Attending: Family Medicine | Admitting: Family Medicine

## 2019-03-18 ENCOUNTER — Encounter (HOSPITAL_COMMUNITY): Payer: Self-pay | Admitting: Emergency Medicine

## 2019-03-18 ENCOUNTER — Emergency Department (HOSPITAL_COMMUNITY): Payer: Medicare Other

## 2019-03-18 DIAGNOSIS — Z794 Long term (current) use of insulin: Secondary | ICD-10-CM

## 2019-03-18 DIAGNOSIS — Z20822 Contact with and (suspected) exposure to covid-19: Secondary | ICD-10-CM | POA: Diagnosis present

## 2019-03-18 DIAGNOSIS — E1122 Type 2 diabetes mellitus with diabetic chronic kidney disease: Secondary | ICD-10-CM | POA: Diagnosis present

## 2019-03-18 DIAGNOSIS — E079 Disorder of thyroid, unspecified: Secondary | ICD-10-CM | POA: Diagnosis present

## 2019-03-18 DIAGNOSIS — Z515 Encounter for palliative care: Secondary | ICD-10-CM | POA: Diagnosis not present

## 2019-03-18 DIAGNOSIS — R0602 Shortness of breath: Secondary | ICD-10-CM | POA: Diagnosis present

## 2019-03-18 DIAGNOSIS — Z888 Allergy status to other drugs, medicaments and biological substances status: Secondary | ICD-10-CM

## 2019-03-18 DIAGNOSIS — N184 Chronic kidney disease, stage 4 (severe): Secondary | ICD-10-CM | POA: Diagnosis present

## 2019-03-18 DIAGNOSIS — Z66 Do not resuscitate: Secondary | ICD-10-CM | POA: Diagnosis present

## 2019-03-18 DIAGNOSIS — D649 Anemia, unspecified: Secondary | ICD-10-CM | POA: Diagnosis present

## 2019-03-18 DIAGNOSIS — N179 Acute kidney failure, unspecified: Secondary | ICD-10-CM | POA: Diagnosis present

## 2019-03-18 DIAGNOSIS — I4510 Unspecified right bundle-branch block: Secondary | ICD-10-CM | POA: Diagnosis present

## 2019-03-18 DIAGNOSIS — J189 Pneumonia, unspecified organism: Principal | ICD-10-CM | POA: Diagnosis present

## 2019-03-18 DIAGNOSIS — M1990 Unspecified osteoarthritis, unspecified site: Secondary | ICD-10-CM | POA: Diagnosis present

## 2019-03-18 DIAGNOSIS — J9601 Acute respiratory failure with hypoxia: Secondary | ICD-10-CM | POA: Diagnosis present

## 2019-03-18 DIAGNOSIS — E039 Hypothyroidism, unspecified: Secondary | ICD-10-CM | POA: Diagnosis present

## 2019-03-18 DIAGNOSIS — I130 Hypertensive heart and chronic kidney disease with heart failure and stage 1 through stage 4 chronic kidney disease, or unspecified chronic kidney disease: Secondary | ICD-10-CM | POA: Diagnosis present

## 2019-03-18 DIAGNOSIS — E875 Hyperkalemia: Secondary | ICD-10-CM | POA: Diagnosis present

## 2019-03-18 DIAGNOSIS — Z885 Allergy status to narcotic agent status: Secondary | ICD-10-CM | POA: Diagnosis not present

## 2019-03-18 DIAGNOSIS — R918 Other nonspecific abnormal finding of lung field: Secondary | ICD-10-CM | POA: Diagnosis not present

## 2019-03-18 DIAGNOSIS — I2510 Atherosclerotic heart disease of native coronary artery without angina pectoris: Secondary | ICD-10-CM | POA: Diagnosis present

## 2019-03-18 DIAGNOSIS — E11649 Type 2 diabetes mellitus with hypoglycemia without coma: Secondary | ICD-10-CM | POA: Diagnosis not present

## 2019-03-18 DIAGNOSIS — Z79899 Other long term (current) drug therapy: Secondary | ICD-10-CM

## 2019-03-18 DIAGNOSIS — I10 Essential (primary) hypertension: Secondary | ICD-10-CM | POA: Diagnosis not present

## 2019-03-18 DIAGNOSIS — Z91048 Other nonmedicinal substance allergy status: Secondary | ICD-10-CM | POA: Diagnosis not present

## 2019-03-18 DIAGNOSIS — I700 Atherosclerosis of aorta: Secondary | ICD-10-CM | POA: Diagnosis present

## 2019-03-18 DIAGNOSIS — I69391 Dysphagia following cerebral infarction: Secondary | ICD-10-CM

## 2019-03-18 DIAGNOSIS — Z7189 Other specified counseling: Secondary | ICD-10-CM | POA: Diagnosis not present

## 2019-03-18 DIAGNOSIS — F0390 Unspecified dementia without behavioral disturbance: Secondary | ICD-10-CM | POA: Diagnosis present

## 2019-03-18 DIAGNOSIS — I5032 Chronic diastolic (congestive) heart failure: Secondary | ICD-10-CM | POA: Diagnosis present

## 2019-03-18 DIAGNOSIS — H9190 Unspecified hearing loss, unspecified ear: Secondary | ICD-10-CM | POA: Diagnosis present

## 2019-03-18 DIAGNOSIS — R0600 Dyspnea, unspecified: Secondary | ICD-10-CM

## 2019-03-18 DIAGNOSIS — R0902 Hypoxemia: Secondary | ICD-10-CM | POA: Diagnosis not present

## 2019-03-18 DIAGNOSIS — Z9981 Dependence on supplemental oxygen: Secondary | ICD-10-CM

## 2019-03-18 DIAGNOSIS — E119 Type 2 diabetes mellitus without complications: Secondary | ICD-10-CM

## 2019-03-18 DIAGNOSIS — Z79890 Hormone replacement therapy (postmenopausal): Secondary | ICD-10-CM

## 2019-03-18 DIAGNOSIS — J9620 Acute and chronic respiratory failure, unspecified whether with hypoxia or hypercapnia: Secondary | ICD-10-CM

## 2019-03-18 LAB — CBC WITH DIFFERENTIAL/PLATELET
Abs Immature Granulocytes: 0.19 10*3/uL — ABNORMAL HIGH (ref 0.00–0.07)
Basophils Absolute: 0.1 10*3/uL (ref 0.0–0.1)
Basophils Relative: 1 %
Eosinophils Absolute: 0.1 10*3/uL (ref 0.0–0.5)
Eosinophils Relative: 1 %
HCT: 36.7 % (ref 36.0–46.0)
Hemoglobin: 11.2 g/dL — ABNORMAL LOW (ref 12.0–15.0)
Immature Granulocytes: 2 %
Lymphocytes Relative: 11 %
Lymphs Abs: 1.1 10*3/uL (ref 0.7–4.0)
MCH: 30.5 pg (ref 26.0–34.0)
MCHC: 30.5 g/dL (ref 30.0–36.0)
MCV: 100 fL (ref 80.0–100.0)
Monocytes Absolute: 0.8 10*3/uL (ref 0.1–1.0)
Monocytes Relative: 8 %
Neutro Abs: 8 10*3/uL — ABNORMAL HIGH (ref 1.7–7.7)
Neutrophils Relative %: 77 %
Platelets: 216 10*3/uL (ref 150–400)
RBC: 3.67 MIL/uL — ABNORMAL LOW (ref 3.87–5.11)
RDW: 14.3 % (ref 11.5–15.5)
WBC: 10.3 10*3/uL (ref 4.0–10.5)
nRBC: 0 % (ref 0.0–0.2)

## 2019-03-18 LAB — URINALYSIS, ROUTINE W REFLEX MICROSCOPIC
Bilirubin Urine: NEGATIVE
Glucose, UA: NEGATIVE mg/dL
Hgb urine dipstick: NEGATIVE
Ketones, ur: NEGATIVE mg/dL
Nitrite: NEGATIVE
Protein, ur: >=300 mg/dL — AB
Specific Gravity, Urine: 1.013 (ref 1.005–1.030)
pH: 6 (ref 5.0–8.0)

## 2019-03-18 LAB — COMPREHENSIVE METABOLIC PANEL
ALT: 15 U/L (ref 0–44)
AST: 15 U/L (ref 15–41)
Albumin: 3.5 g/dL (ref 3.5–5.0)
Alkaline Phosphatase: 63 U/L (ref 38–126)
Anion gap: 7 (ref 5–15)
BUN: 41 mg/dL — ABNORMAL HIGH (ref 8–23)
CO2: 21 mmol/L — ABNORMAL LOW (ref 22–32)
Calcium: 8.6 mg/dL — ABNORMAL LOW (ref 8.9–10.3)
Chloride: 107 mmol/L (ref 98–111)
Creatinine, Ser: 2.27 mg/dL — ABNORMAL HIGH (ref 0.44–1.00)
GFR calc Af Amer: 21 mL/min — ABNORMAL LOW (ref >60)
GFR calc non Af Amer: 19 mL/min — ABNORMAL LOW (ref >60)
Glucose, Bld: 168 mg/dL — ABNORMAL HIGH (ref 70–99)
Potassium: 6.3 mmol/L (ref 3.5–5.1)
Sodium: 135 mmol/L (ref 135–145)
Total Bilirubin: 0.4 mg/dL (ref 0.3–1.2)
Total Protein: 6.6 g/dL (ref 6.5–8.1)

## 2019-03-18 LAB — RESPIRATORY PANEL BY RT PCR (FLU A&B, COVID)
Influenza A by PCR: NEGATIVE
Influenza B by PCR: NEGATIVE
SARS Coronavirus 2 by RT PCR: NEGATIVE

## 2019-03-18 LAB — CBG MONITORING, ED: Glucose-Capillary: 204 mg/dL — ABNORMAL HIGH (ref 70–99)

## 2019-03-18 LAB — TROPONIN I (HIGH SENSITIVITY)
Troponin I (High Sensitivity): 5 ng/L (ref <18)
Troponin I (High Sensitivity): 7 ng/L (ref <18)

## 2019-03-18 LAB — BRAIN NATRIURETIC PEPTIDE: B Natriuretic Peptide: 225 pg/mL — ABNORMAL HIGH (ref 0.0–100.0)

## 2019-03-18 LAB — POTASSIUM: Potassium: 5 mmol/L (ref 3.5–5.1)

## 2019-03-18 MED ORDER — POLYETHYLENE GLYCOL 3350 17 G PO PACK: 17.0000 g | PACK | Freq: Every day | ORAL | Status: DC | PRN

## 2019-03-18 MED ORDER — LACTATED RINGERS IV BOLUS
1000.0000 mL | Freq: Once | INTRAVENOUS | Status: AC
  Administered 2019-03-18: 1000 mL via INTRAVENOUS

## 2019-03-18 MED ORDER — LEVOTHYROXINE SODIUM 50 MCG PO TABS
50.0000 ug | ORAL_TABLET | Freq: Every day | ORAL | Status: DC
  Administered 2019-03-19 – 2019-03-22 (×4): 50 ug via ORAL
  Filled 2019-03-18 (×4): qty 1

## 2019-03-18 MED ORDER — SODIUM ZIRCONIUM CYCLOSILICATE 5 G PO PACK
5.0000 g | PACK | Freq: Every day | ORAL | Status: DC
  Administered 2019-03-18 – 2019-03-19 (×2): 5 g via ORAL
  Filled 2019-03-18 (×2): qty 1

## 2019-03-18 MED ORDER — ACETAMINOPHEN 650 MG RE SUPP: 650.0000 mg | Freq: Four times a day (QID) | RECTAL | Status: DC | PRN

## 2019-03-18 MED ORDER — DEXTROSE 50 % IV SOLN
1.0000 | Freq: Once | INTRAVENOUS | Status: AC
  Administered 2019-03-18: 50 mL via INTRAVENOUS
  Filled 2019-03-18: qty 50

## 2019-03-18 MED ORDER — INSULIN GLARGINE 100 UNIT/ML ~~LOC~~ SOLN
10.0000 [IU] | Freq: Every day | SUBCUTANEOUS | Status: DC
  Administered 2019-03-19 (×2): 10 [IU] via SUBCUTANEOUS
  Filled 2019-03-18 (×3): qty 0.1

## 2019-03-18 MED ORDER — TRAMADOL HCL 50 MG PO TABS
50.0000 mg | ORAL_TABLET | Freq: Four times a day (QID) | ORAL | Status: DC | PRN
  Administered 2019-03-19: 50 mg via ORAL
  Filled 2019-03-18: qty 1

## 2019-03-18 MED ORDER — ONDANSETRON HCL 4 MG PO TABS: 4.0000 mg | ORAL_TABLET | Freq: Four times a day (QID) | ORAL | Status: DC | PRN

## 2019-03-18 MED ORDER — SODIUM CHLORIDE 0.9 % IV SOLN
500.0000 mg | INTRAVENOUS | Status: DC
  Administered 2019-03-19 – 2019-03-22 (×4): 500 mg via INTRAVENOUS
  Filled 2019-03-18 (×4): qty 500

## 2019-03-18 MED ORDER — SODIUM BICARBONATE 8.4 % IV SOLN
INTRAVENOUS | Status: AC
  Filled 2019-03-18: qty 50

## 2019-03-18 MED ORDER — ACETAMINOPHEN 325 MG PO TABS: 650.0000 mg | ORAL_TABLET | Freq: Four times a day (QID) | ORAL | Status: DC | PRN

## 2019-03-18 MED ORDER — FAMOTIDINE 20 MG PO TABS
20.0000 mg | ORAL_TABLET | Freq: Every day | ORAL | Status: DC
  Administered 2019-03-19 – 2019-03-22 (×4): 20 mg via ORAL
  Filled 2019-03-18 (×4): qty 1

## 2019-03-18 MED ORDER — SODIUM BICARBONATE 8.4 % IV SOLN
50.0000 meq | Freq: Once | INTRAVENOUS | Status: AC
  Administered 2019-03-18: 50 meq via INTRAVENOUS

## 2019-03-18 MED ORDER — DEXAMETHASONE SODIUM PHOSPHATE 10 MG/ML IJ SOLN
10.0000 mg | Freq: Once | INTRAMUSCULAR | Status: AC
  Administered 2019-03-18: 10 mg via INTRAVENOUS
  Filled 2019-03-18: qty 1

## 2019-03-18 MED ORDER — ONDANSETRON HCL 4 MG/2ML IJ SOLN: 4.0000 mg | Freq: Four times a day (QID) | INTRAMUSCULAR | Status: DC | PRN

## 2019-03-18 MED ORDER — ATORVASTATIN CALCIUM 20 MG PO TABS
20.0000 mg | ORAL_TABLET | Freq: Every day | ORAL | Status: DC
  Administered 2019-03-19 – 2019-03-22 (×4): 20 mg via ORAL
  Filled 2019-03-18: qty 2
  Filled 2019-03-18 (×3): qty 1

## 2019-03-18 MED ORDER — TRAZODONE HCL 50 MG PO TABS
50.0000 mg | ORAL_TABLET | Freq: Every day | ORAL | Status: DC
  Administered 2019-03-19 – 2019-03-21 (×4): 50 mg via ORAL
  Filled 2019-03-18 (×4): qty 1

## 2019-03-18 MED ORDER — INSULIN ASPART 100 UNIT/ML ~~LOC~~ SOLN
0.0000 [IU] | Freq: Three times a day (TID) | SUBCUTANEOUS | Status: DC
  Administered 2019-03-19: 2 [IU] via SUBCUTANEOUS
  Administered 2019-03-19 – 2019-03-21 (×2): 3 [IU] via SUBCUTANEOUS
  Administered 2019-03-22: 11 [IU] via SUBCUTANEOUS
  Filled 2019-03-18 (×2): qty 1

## 2019-03-18 MED ORDER — ALBUTEROL SULFATE (2.5 MG/3ML) 0.083% IN NEBU
5.0000 mg | INHALATION_SOLUTION | Freq: Once | RESPIRATORY_TRACT | Status: AC
  Administered 2019-03-18: 5 mg via RESPIRATORY_TRACT
  Filled 2019-03-18: qty 6

## 2019-03-18 MED ORDER — SODIUM CHLORIDE 0.9 % IV SOLN
1.0000 g | INTRAVENOUS | Status: DC
  Administered 2019-03-19 – 2019-03-22 (×4): 1 g via INTRAVENOUS
  Filled 2019-03-18 (×4): qty 10

## 2019-03-18 MED ORDER — HYDRALAZINE HCL 25 MG PO TABS
25.0000 mg | ORAL_TABLET | Freq: Three times a day (TID) | ORAL | Status: DC
  Administered 2019-03-19 – 2019-03-21 (×9): 25 mg via ORAL
  Filled 2019-03-18 (×8): qty 1

## 2019-03-18 MED ORDER — IPRATROPIUM-ALBUTEROL 0.5-2.5 (3) MG/3ML IN SOLN
3.0000 mL | Freq: Four times a day (QID) | RESPIRATORY_TRACT | Status: DC | PRN
  Administered 2019-03-20: 3 mL via RESPIRATORY_TRACT
  Filled 2019-03-18: qty 3

## 2019-03-18 MED ORDER — INSULIN ASPART 100 UNIT/ML IV SOLN
5.0000 [IU] | Freq: Once | INTRAVENOUS | Status: AC
  Administered 2019-03-18: 5 [IU] via INTRAVENOUS

## 2019-03-18 MED ORDER — HEPARIN SODIUM (PORCINE) 5000 UNIT/ML IJ SOLN
5000.0000 [IU] | Freq: Three times a day (TID) | INTRAMUSCULAR | Status: DC
  Administered 2019-03-19 – 2019-03-22 (×10): 5000 [IU] via SUBCUTANEOUS
  Filled 2019-03-18 (×10): qty 1

## 2019-03-18 MED ORDER — AMLODIPINE BESYLATE 5 MG PO TABS
10.0000 mg | ORAL_TABLET | Freq: Every day | ORAL | Status: DC
  Administered 2019-03-19 – 2019-03-22 (×4): 10 mg via ORAL
  Filled 2019-03-18 (×4): qty 2

## 2019-03-18 NOTE — ED Triage Notes (Signed)
From [facility] which is covid free at this time   Reports sob for 48 hours   EMS moving to bed 96-87 percent when she stood and pivoted  No N/V/D  CAD by hx HTN  Memory issues   Pt brings DNR from facility

## 2019-03-18 NOTE — ED Notes (Signed)
Call to [facility] to request a hospital bed for pt  Pt does not ambulate but can stand and pivot with assistance per EMS

## 2019-03-18 NOTE — ED Provider Notes (Signed)
[facility] EMERGENCY DEPARTMENT Provider Note   CSN: [id number] Arrival date & time: 03/18/19  1436     History Chief Complaint  Patient presents with  . Shortness of Breath    Regina Bush is a 86 y.o. female.  HPI     History provided by the patient herself. Patient arrives from a nursing facility with concern of 2 days of dyspnea. Patient is a poor historian, acknowledges medical problems, but states that she has been feeling generally well, and was well until yesterday. Since that time she has had persistent dyspnea.  She denies cough, denies fever, denies pain, including chest pain or abdominal discomfort. She also denies nausea, vomiting, diarrhea. She is unaware if there have been any positive coronavirus cases in her nursing facility. Patient does have a history of dementia, but seems to answer questions regarding her current presentation appropriately, with limitation only secondary to the patient's hearing loss.  Past Medical History:  Diagnosis Date  . Anemia   . Arthritis   . Cerebral infarction ([place])   . Coronary artery disease   . Dementia ([place])   . Diabetes mellitus without complication (HCC)    Type 2  . Dysphagia due to recent cerebral infarction   . Failure to thrive (0-17)   . Hypercalcemia   . Hypertension   . Pneumonitis   . Protein calorie malnutrition ([place])   . Subdural hematoma ([place])   . Thyroid disease     Patient Active Problem List   Diagnosis Date Noted  . Urinary retention 02/20/2018  . Hypothyroidism 11/19/2016  . History of CVA in adulthood 11/19/2016  . UTI (urinary tract infection) 11/15/2016  . ARF (acute renal failure) ([place]) 11/15/2016  . Hyponatremia 11/15/2016  . HCAP (healthcare-associated pneumonia) 08/19/2016  . Subdural hematoma ([place])   . Hypertension   . Diabetes mellitus without complication ([place])   . Coronary artery disease     History reviewed. No pertinent surgical history.   OB History   No obstetric  history on file.     Family History  Family history unknown: Yes    Social History   Tobacco Use  . Smoking status: Never Smoker  . Smokeless tobacco: Never Used  Substance Use Topics  . Alcohol use: No  . Drug use: No    Home Medications Prior to Admission medications   Medication Sig Start Date End Date Taking? Authorizing Provider  acetaminophen (TYLENOL) 325 MG tablet Take 2 tablets (650 mg total) by mouth every 6 (six) hours as needed for mild pain or moderate pain. 08/25/16   Rosita Fire, MD  amLODipine (NORVASC) 10 MG tablet Take 10 mg by mouth daily.     [provider]  aspirin EC 81 MG tablet Take 81 mg by mouth daily.    [provider]  atorvastatin (LIPITOR) 20 MG tablet Take 20 mg by mouth daily.     [provider]  cetirizine (ZYRTEC) 10 MG tablet Take 10 mg by mouth at bedtime as needed for allergies.    [provider]  cholecalciferol (VITAMIN D) 1000 units tablet Take 1,000 Units by mouth daily.     [provider]  clotrimazole-betamethasone (LOTRISONE) cream Apply 1 application topically 2 (two) times daily. Apply 2 grams to affected areas daily until resolved    [provider]  famotidine (PEPCID) 20 MG tablet Take 20 mg by mouth daily.     [provider]  fluticasone (FLONASE) 50 MCG/ACT nasal spray Place 2  sprays into both nostrils daily.    [provider]  hydrALAZINE (APRESOLINE) 25 MG tablet Take 25 mg by mouth 3 (three) times daily. Give 1 tablet by mouth three times a day for HTN hold for systolic BP <740    [provider]  insulin glargine (LANTUS) 100 UNIT/ML injection Inject 0.12 mLs (12 Units total) into the skin daily. Patient taking differently: Inject 16 Units into the skin daily.  02/16/18   Sinda Du, MD  ketoconazole (NIZORAL) 2 % cream Apply 1 application topically daily. Apply to abdominal folds under breast, groin area and sacrum area where redness  and rash is located report to provider if no improvements is noted.    [provider]  levothyroxine (SYNTHROID, LEVOTHROID) 50 MCG tablet Take 50 mcg by mouth daily before breakfast.     [provider]  Magnesium 400 MG TABS Take 1 tablet by mouth daily.    [provider]  magnesium oxide (MAG-OX) 400 MG tablet Take 400 mg by mouth daily.     [provider]  Multiple Vitamin (MULTIVITAMIN WITH MINERALS) TABS tablet Take 1 tablet by mouth daily.     [provider]  sulfamethoxazole-trimethoprim (BACTRIM DS) 800-160 MG tablet Take 1 tablet by mouth 2 (two) times daily.    [provider]  tamsulosin (FLOMAX) 0.4 MG CAPS capsule Take 0.4 mg by mouth every evening.    [provider]  traZODone (DESYREL) 50 MG tablet Take 50 mg by mouth at bedtime.    [provider]  Wheat Dextrin (BENEFIBER PO) Take 10 mLs by mouth daily.     [provider]    Allergies    Cymbalta [duloxetine hcl], Effexor [venlafaxine], Morphine and related, and Tape  Review of Systems   Review of Systems  Constitutional:       Per HPI, otherwise negative  HENT:       Per HPI, otherwise negative  Respiratory:       Per HPI, otherwise negative  Cardiovascular:       Per HPI, otherwise negative  Gastrointestinal: Negative for vomiting.  Endocrine:       Negative aside from HPI  Genitourinary:       Neg aside from HPI   Musculoskeletal:       Per HPI, otherwise negative  Skin: Negative.   Neurological: Negative for syncope.    Physical Exam Updated Vital Signs BP (!) 182/55   Pulse 60   Resp (!) 25   Ht 5\' 3"  (1.6 m)   Wt 81.6 kg   SpO2 95%   BMI 31.89 kg/m   Physical Exam Vitals and nursing note reviewed.  Constitutional:      Appearance: She is ill-appearing.     Comments: Ill-appearing elderly female awake and alert, poor hearing, but interacting appropriately.  HENT:     Head: Normocephalic and atraumatic.    Eyes:     Conjunctiva/sclera: Conjunctivae normal.  Cardiovascular:     Rate and Rhythm: Regular rhythm.  Pulmonary:     Effort: Tachypnea and accessory muscle usage present.     Breath sounds: Decreased breath sounds present. No wheezing.  Chest:     Chest wall: No mass or tenderness.  Abdominal:     General: There is no distension.  Musculoskeletal:     Right lower leg: Edema present.     Left lower leg: Edema present.  Skin:    General: Skin is warm and dry.  Neurological:  Mental Status: She is alert and oriented to person, place, and time.     Cranial Nerves: No cranial nerve deficit.     ED Results / Procedures / Treatments   Labs (all labs ordered are listed, but only abnormal results are displayed) Labs Reviewed  COMPREHENSIVE METABOLIC PANEL - Abnormal; Notable for the following components:      Result Value   Potassium 6.3 (*)    CO2 21 (*)    Glucose, Bld 168 (*)    BUN 41 (*)    Creatinine, Ser 2.27 (*)    Calcium 8.6 (*)    GFR calc non Af Amer 19 (*)    GFR calc Af Amer 21 (*)    All other components within normal limits  CBC WITH DIFFERENTIAL/PLATELET - Abnormal; Notable for the following components:   RBC 3.67 (*)    Hemoglobin 11.2 (*)    Neutro Abs 8.0 (*)    Abs Immature Granulocytes 0.19 (*)    All other components within normal limits  BRAIN NATRIURETIC PEPTIDE - Abnormal; Notable for the following components:   B Natriuretic Peptide 225.0 (*)    All other components within normal limits  CBG MONITORING, ED - Abnormal; Notable for the following components:   Glucose-Capillary 204 (*)    All other components within normal limits  RESPIRATORY PANEL BY RT PCR (FLU A&B, COVID)  URINALYSIS, ROUTINE W REFLEX MICROSCOPIC    EKG Sinus rhythm, rate 63, intraventricular conduction delay, substantial artifact, wander, not overtly ischemic, nor with notable changes compared to prior, but abnormal EKG.  Radiology DG Chest Port 1 View  Result  Date: 03/18/2019 CLINICAL DATA:  SOB x 2 days. Hx DM, HTN, CAD. Pt being tested for COVID by respiratory panel. EXAM: PORTABLE CHEST 1 VIEW COMPARISON:  Chest radiograph 02/06/2018 FINDINGS: Stable cardiomediastinal contours with enlarged heart size. Aortic arch calcification. There are diffuse bilateral interstitial and alveolar opacities which could reflect infection or edema. No pneumothorax or large pleural effusion. No acute finding in the visualized skeleton. IMPRESSION: Diffuse bilateral interstitial and alveolar opacities which could reflect infection or edema. Electronically Signed   By: Audie Pinto M.D.   On: 03/18/2019 15:39    Procedures Procedures (including critical care time) CRITICAL CARE Performed by: Carmin Muskrat Total critical care time: 35 minutes Critical care time was exclusive of separately billable procedures and treating other patients. Critical care was necessary to treat or prevent imminent or life-threatening deterioration. Critical care was time spent personally by me on the following activities: development of treatment plan with patient and/or surrogate as well as nursing, discussions with consultants, evaluation of patient's response to treatment, examination of patient, obtaining history from patient or surrogate, ordering and performing treatments and interventions, ordering and review of laboratory studies, ordering and review of radiographic studies, pulse oximetry and re-evaluation of patient's condition.   Medications Ordered in ED Medications  sodium zirconium cyclosilicate (LOKELMA) packet 5 g (5 g Oral Given 03/18/19 1707)  dexamethasone (DECADRON) injection 10 mg (has no administration in time range)  albuterol (PROVENTIL) (2.5 MG/3ML) 0.083% nebulizer solution 5 mg (5 mg Nebulization Given 03/18/19 1743)  insulin aspart (novoLOG) injection 5 Units (5 Units Intravenous Given 03/18/19 1707)    And  dextrose 50 % solution 50 mL (50 mLs Intravenous Given  03/18/19 1706)  sodium bicarbonate injection 50 mEq (50 mEq Intravenous Given 03/18/19 1707)  lactated ringers bolus 1,000 mL (1,000 mLs Intravenous New Bag/Given 03/18/19 1707)    ED Course  I have reviewed the triage vital signs and the nursing notes.  Pertinent labs & imaging results that were available during my care of the patient were reviewed by me and considered in my medical decision making (see chart for details).    MDM Rules/Calculators/A&P                     Initial labs notable for hyper kalemia, 6.3, as well as worsening renal function, with a creatinine 2.27, BUN 41. With these concerns, the patient will receive albuterol, insulin, dextrose, sodium bicarbonate. EKG not appreciably different from prior. 5:43 PM I discussed the patient's case with her daughter, including conversation about the patient's negative Covid test, but concerning findings including hyperkalemia, worsening renal function and new oxygen requirement. With history of prior episode of pneumonitis, this is a possibility, though daughter is unaware of specifics of that episode.  PE considered, but poor renal function precludes ED testing.  Patient has received meds for hyperkalemia, has no fever, no leukocytosis, and negative Covid test, but with concern for her new oxygen requirement, hyperkalemia, as above, the patient will require admission for further monitoring, management, evaluation.  6:11 PM I also discussed the patient's findings thus far with her son, who is a Field seismologist.   Final Clinical Impression(s) / ED Diagnoses Final diagnoses:  Hypoxia  Hyperkalemia    Rx / DC Orders ED Discharge Orders    None       Carmin Muskrat, MD 03/18/19 1814

## 2019-03-18 NOTE — ED Notes (Signed)
CRITICAL VALUE ALERT  Critical Value:  Potassium 6.3  Date & Time Notied:  1628, 1628  Provider Notified: Dr. Vanita Panda  Orders Received/Actions taken: no new orders

## 2019-03-18 NOTE — ED Notes (Signed)
Pt reports that she feels the stretcher is uncomfortable and she will not be able to sleep on it   Request a hospital bed  Tim, [facility] apprised and asked for a hospital bed

## 2019-03-18 NOTE — H&P (Signed)
TRH H&P    Patient Demographics:    Regina Bush, is a 86 y.o. female  MRN: [id number]  DOB - 1927-05-25  Admit Date - 03/18/2019  Referring MD/NP/PA: Dr. Vanita Panda  Outpatient Primary MD for the patient is Doree Albee, MD  Patient coming from: [place]   Chief complaint-dyspnea   HPI:    Regina Bush  is a 86 y.o. female, with hx of thyroid disease, subdural hematoma, pneumonitis, hypertension, diabetes mellitus type 2, dementia, and more presents to the ED with a chief complaint of shortness of breath.  Patient is a poor historian secondary to dementia.  She does provide what part of the history she can remember, but when asked if she has had any breathing treatment or inhaler she says no and then on review of medical record it is evident that she had a breathing treatment in the ER today so it is unclear if history is reliable.  I personally called and spoke with the nurse at [facility] who sent her to the ED.  She reports that patient was complaining of dyspnea and chest pressure.  Patient apparently had told her she could not breathe and felt like someone was sitting on her chest.  At that time her blood pressure is 140/80, temp was 97.3, O2 sats were 96% on room air, but the patient insisted that she could not breathe and so she was sent to the ER.  Nurse does report that patient complained of dyspnea last night as well but said that she felt all right when she was checked later.  Nurse had given her instructions to let them know if she felt worse and she did not complain about it again until today.  Patient reports that she felt short of breath with no chest pressure or chest pain.  She reports her shortness of breath was gradual in onset and progressively worsening since yesterday.  Patient reports no cough, and no subjective fever.  Patient does endorse nausea, and palpitations since yesterday.  Patient  reports that the palpitations only last for a few seconds to a minute.  Patient reports that her shortness of breath is nonexertional.  She reports that she has never had anything like this before.  She reports that she does not wear oxygen at baseline, and she feels a little better with the nasal cannula on, but still feels short of breath.  Patient denies any change in urinary habits.  Patient denies hematuria.  Nurses unable to give more insight on this as patient attends to her own bathroom needs with pull ups.    ED course Pulse 60, respiratory rate 22-26, blood pressure 173/56, O2 sats 95 to 100% on 2 L nasal cannula Potassium 6.3 BUN 41 Creatinine 2.27 BUN to creatinine ratio 18 GFR 19 BNP 225 Covid, flu a, flu B, negative Albuterol breathing treatment administered 5 units of insulin 1 amp of D50 LR 1000 mL bolus Sodium bicarb 1 amp [place] Chest x-ray -diffuse bilateral interstitial and alveolar opacities which could reflect infection or edema EKG shows sinus  rhythm with a heart rate of 63 and a QTC of 456, no hyperkalemic changes     Review of systems:    In addition to the HPI above,  No Fever-chills, No Headache, No changes with Vision or hearing, No problems swallowing food or Liquids, No Chest pain, Cough or Shortness of Breath, No Abdominal pain, endorses nausea but no vomiting, bowel movements are regular, No Blood in stool or Urine, No dysuria, No new skin rashes or bruises, Doris is chronic right knee pain No new weakness, tingling, numbness in any extremity, No recent weight gain or loss, No polyuria, polydypsia or polyphagia, No significant Mental Stressors.  All other systems reviewed and are negative.    Past History of the following :    Past Medical History:  Diagnosis Date  . Anemia   . Arthritis   . Cerebral infarction ([place])   . Coronary artery disease   . Dementia ([place])   . Diabetes mellitus without complication (HCC)    Type 2  .  Dysphagia due to recent cerebral infarction   . Failure to thrive (0-17)   . Hypercalcemia   . Hypertension   . Pneumonitis   . Protein calorie malnutrition ([place])   . Subdural hematoma ([place])   . Thyroid disease       History reviewed. No pertinent surgical history.    Social History:      Social History   Tobacco Use  . Smoking status: Never Smoker  . Smokeless tobacco: Never Used  Substance Use Topics  . Alcohol use: No       Family History :     Family History  Family history unknown: Yes      Home Medications:   Prior to Admission medications   Medication Sig Start Date End Date Taking? Authorizing Provider  acetaminophen (TYLENOL) 325 MG tablet Take 2 tablets (650 mg total) by mouth every 6 (six) hours as needed for mild pain or moderate pain. 08/25/16   Rosita Fire, MD  amLODipine (NORVASC) 10 MG tablet Take 10 mg by mouth daily.     [provider]  aspirin EC 81 MG tablet Take 81 mg by mouth daily.    [provider]  atorvastatin (LIPITOR) 20 MG tablet Take 20 mg by mouth daily.     [provider]  cetirizine (ZYRTEC) 10 MG tablet Take 10 mg by mouth at bedtime as needed for allergies.    [provider]  cholecalciferol (VITAMIN D) 1000 units tablet Take 1,000 Units by mouth daily.     [provider]  clotrimazole-betamethasone (LOTRISONE) cream Apply 1 application topically 2 (two) times daily. Apply 2 grams to affected areas daily until resolved    [provider]  famotidine (PEPCID) 20 MG tablet Take 20 mg by mouth daily.     [provider]  fluticasone (FLONASE) 50 MCG/ACT nasal spray Place 2 sprays into both nostrils daily.    [provider]  hydrALAZINE (APRESOLINE) 25 MG tablet Take 25 mg by mouth 3 (three) times daily. Give 1 tablet by mouth three times a day for HTN hold for systolic BP <992    [provider]  insulin glargine (LANTUS) 100 UNIT/ML injection  Inject 0.12 mLs (12 Units total) into the skin daily. Patient taking differently: Inject 16 Units into the skin daily.  02/16/18   Sinda Du, MD  ketoconazole (NIZORAL) 2 % cream Apply 1 application topically daily. Apply to abdominal folds under breast, groin  area and sacrum area where redness and rash is located report to provider if no improvements is noted.    [provider]  levothyroxine (SYNTHROID, LEVOTHROID) 50 MCG tablet Take 50 mcg by mouth daily before breakfast.     [provider]  Magnesium 400 MG TABS Take 1 tablet by mouth daily.    [provider]  magnesium oxide (MAG-OX) 400 MG tablet Take 400 mg by mouth daily.     [provider]  Multiple Vitamin (MULTIVITAMIN WITH MINERALS) TABS tablet Take 1 tablet by mouth daily.     [provider]  sulfamethoxazole-trimethoprim (BACTRIM DS) 800-160 MG tablet Take 1 tablet by mouth 2 (two) times daily.    [provider]  tamsulosin (FLOMAX) 0.4 MG CAPS capsule Take 0.4 mg by mouth every evening.    [provider]  traZODone (DESYREL) 50 MG tablet Take 50 mg by mouth at bedtime.    [provider]  Wheat Dextrin (BENEFIBER PO) Take 10 mLs by mouth daily.     [provider]     Allergies:     Allergies  Allergen Reactions  . Cymbalta [Duloxetine Hcl] Other (See Comments)    unknown  . Effexor [Venlafaxine] Other (See Comments)    unknown  . Morphine And Related Other (See Comments)    Passed out   . Tape     adhesive     Physical Exam:   Vitals  Blood pressure (!) 149/50, pulse 73, temperature 98 F (36.7 C), temperature source Oral, resp. rate (!) 24, height 5\' 3"  (1.6 m), weight 81.6 kg, SpO2 98 %.  1.  General: Patient lying left lateral decubitus in bed in no acute distress  2. Psychiatric: Pleasant and cooperative with exam  3. Neurologic: Alert and oriented to self and place, can say year and given hints. Cranial nerves  II through XII are grossly intact no focal deficit on limited exam  4. HEENMT:  Head is atraumatic, normocephalic.  Eyes: Pupils are reactive to light EOMI. neck is supple.  Trachea is midline.  5. Respiratory : Very slight wheeze auscultated, no rales or rhonchi. Use of accessory muscles.  6. Cardiovascular : Heart rate is normal rhythm is regular, 2+ peripheral edema bilaterally lower extremities  7. Gastrointestinal:  Abdomen is soft nondistended nontender to palpation  8. Skin:  No acute lesions on limited skin exam  9.Musculoskeletal:  Right knee has arthritic deformity    Data Review:    CBC Recent Labs  Lab 03/18/19 1530  WBC 10.3  HGB 11.2*  HCT 36.7  PLT 216  MCV 100.0  MCH 30.5  MCHC 30.5  RDW 14.3  LYMPHSABS 1.1  MONOABS 0.8  EOSABS 0.1  BASOSABS 0.1   ------------------------------------------------------------------------------------------------------------------  Results for orders placed or performed during the hospital encounter of 03/18/19 (from the past 48 hour(s))  Respiratory Panel by RT PCR (Flu A&B, Covid) - Nasopharyngeal Swab     Status: None   Collection Time: 03/18/19  3:16 PM   Specimen: Nasopharyngeal Swab  Result Value Ref Range   SARS Coronavirus 2 by RT PCR NEGATIVE NEGATIVE    Comment: (NOTE) SARS-CoV-2 target nucleic acids are NOT DETECTED. The SARS-CoV-2 RNA is generally detectable in upper respiratoy specimens during the acute phase of infection. The lowest concentration of SARS-CoV-2 viral copies this assay can detect is 131 copies/mL. A negative result does not preclude SARS-Cov-2 infection and should not be used as the sole basis for treatment or  other patient management decisions. A negative result may occur with  improper specimen collection/handling, submission of specimen other than nasopharyngeal swab, presence of viral mutation(s) within the areas targeted by this assay, and inadequate number of viral copies (<131  copies/mL). A negative result must be combined with clinical observations, patient history, and epidemiological information. The expected result is Negative. Fact Sheet for Patients:  PinkCheek.be Fact Sheet for Healthcare Providers:  GravelBags.it This test is not yet ap proved or cleared by the [place] FDA and  has been authorized for detection and/or diagnosis of SARS-CoV-2 by FDA under an Emergency Use Authorization (EUA). This EUA will remain  in effect (meaning this test can be used) for the duration of the COVID-19 declaration under Section 564(b)(1) of the Act, 21 U.S.C. section 360bbb-3(b)(1), unless the authorization is terminated or revoked sooner.    Influenza A by PCR NEGATIVE NEGATIVE   Influenza B by PCR NEGATIVE NEGATIVE    Comment: (NOTE) The Xpert Xpress SARS-CoV-2/FLU/RSV assay is intended as an aid in  the diagnosis of influenza from Nasopharyngeal swab specimens and  should not be used as a sole basis for treatment. Nasal washings and  aspirates are unacceptable for Xpert Xpress SARS-CoV-2/FLU/RSV  testing. Fact Sheet for Patients: PinkCheek.be Fact Sheet for Healthcare Providers: GravelBags.it This test is not yet approved or cleared by the [place] FDA and  has been authorized for detection and/or diagnosis of SARS-CoV-2 by  FDA under an Emergency Use Authorization (EUA). This EUA will remain  in effect (meaning this test can be used) for the duration of the  Covid-19 declaration under Section 564(b)(1) of the Act, 21  U.S.C. section 360bbb-3(b)(1), unless the authorization is  terminated or revoked. Performed at [facility], [address] [place], [place] [address]   Comprehensive metabolic panel     Status: Abnormal   Collection Time: 03/18/19  3:30 PM  Result Value Ref Range   Sodium 135 135 - 145 mmol/L   Potassium 6.3 (HH)  3.5 - 5.1 mmol/L    Comment: CRITICAL RESULT CALLED TO, READ BACK BY AND VERIFIED WITH: MINTER,R. AT 1626 ON 03/18/2019 BY EVA    Chloride 107 98 - 111 mmol/L   CO2 21 (L) 22 - 32 mmol/L   Glucose, Bld 168 (H) 70 - 99 mg/dL   BUN 41 (H) 8 - 23 mg/dL   Creatinine, Ser 2.27 (H) 0.44 - 1.00 mg/dL   Calcium 8.6 (L) 8.9 - 10.3 mg/dL   Total Protein 6.6 6.5 - 8.1 g/dL   Albumin 3.5 3.5 - 5.0 g/dL   AST 15 15 - 41 U/L   ALT 15 0 - 44 U/L   Alkaline Phosphatase 63 38 - 126 U/L   Total Bilirubin 0.4 0.3 - 1.2 mg/dL   GFR calc non Af Amer 19 (L) >60 mL/min   GFR calc Af Amer 21 (L) >60 mL/min   Anion gap 7 5 - 15    Comment: Performed at [facility], [address], [place], [place] [address]  CBC WITH DIFFERENTIAL     Status: Abnormal   Collection Time: 03/18/19  3:30 PM  Result Value Ref Range   WBC 10.3 4.0 - 10.5 K/uL   RBC 3.67 (L) 3.87 - 5.11 MIL/uL   Hemoglobin 11.2 (L) 12.0 - 15.0 g/dL   HCT 36.7 36.0 - 46.0 %   MCV 100.0 80.0 - 100.0 fL   MCH 30.5 26.0 - 34.0 pg   MCHC 30.5 30.0 - 36.0  g/dL   RDW 14.3 11.5 - 15.5 %   Platelets 216 150 - 400 K/uL   nRBC 0.0 0.0 - 0.2 %   Neutrophils Relative % 77 %   Neutro Abs 8.0 (H) 1.7 - 7.7 K/uL   Lymphocytes Relative 11 %   Lymphs Abs 1.1 0.7 - 4.0 K/uL   Monocytes Relative 8 %   Monocytes Absolute 0.8 0.1 - 1.0 K/uL   Eosinophils Relative 1 %   Eosinophils Absolute 0.1 0.0 - 0.5 K/uL   Basophils Relative 1 %   Basophils Absolute 0.1 0.0 - 0.1 K/uL   Immature Granulocytes 2 %   Abs Immature Granulocytes 0.19 (H) 0.00 - 0.07 K/uL    Comment: Performed at [facility], [address] [place], [place] [address]  Brain natriuretic peptide     Status: Abnormal   Collection Time: 03/18/19  3:30 PM  Result Value Ref Range   B Natriuretic Peptide 225.0 (H) 0.0 - 100.0 pg/mL    Comment: Performed at [facility], [address] [place], [place] [address]  CBG monitoring, ED     Status: Abnormal   Collection Time: 03/18/19  5:30 PM    Result Value Ref Range   Glucose-Capillary 204 (H) 70 - 99 mg/dL    Chemistries  Recent Labs  Lab 03/18/19 1530  NA 135  K 6.3*  CL 107  CO2 21*  GLUCOSE 168*  BUN 41*  CREATININE 2.27*  CALCIUM 8.6*  AST 15  ALT 15  ALKPHOS 63  BILITOT 0.4   ------------------------------------------------------------------------------------------------------------------  ------------------------------------------------------------------------------------------------------------------ GFR: Estimated Creatinine Clearance: 17 mL/min (A) (by C-G formula based on SCr of 2.27 mg/dL (H)). Liver Function Tests: Recent Labs  Lab 03/18/19 1530  AST 15  ALT 15  ALKPHOS 63  BILITOT 0.4  PROT 6.6  ALBUMIN 3.5   No results for input(s): LIPASE, AMYLASE in the last 168 hours. No results for input(s): AMMONIA in the last 168 hours. Coagulation Profile: No results for input(s): INR, PROTIME in the last 168 hours. Cardiac Enzymes: No results for input(s): CKTOTAL, CKMB, CKMBINDEX, TROPONINI in the last 168 hours. BNP (last 3 results) No results for input(s): PROBNP in the last 8760 hours. HbA1C: No results for input(s): HGBA1C in the last 72 hours. CBG: Recent Labs  Lab 03/18/19 1730  GLUCAP 204*   Lipid Profile: No results for input(s): CHOL, HDL, LDLCALC, TRIG, CHOLHDL, LDLDIRECT in the last 72 hours. Thyroid Function Tests: No results for input(s): TSH, T4TOTAL, FREET4, T3FREE, THYROIDAB in the last 72 hours. Anemia Panel: No results for input(s): VITAMINB12, FOLATE, FERRITIN, TIBC, IRON, RETICCTPCT in the last 72 hours.  --------------------------------------------------------------------------------------------------------------- Urine analysis:    Component Value Date/Time   COLORURINE YELLOW 02/12/2018 1035   APPEARANCEUR CLOUDY (A) 02/12/2018 1035   LABSPEC 1.014 02/12/2018 1035   PHURINE 5.0 02/12/2018 1035   GLUCOSEU NEGATIVE 02/12/2018 1035   HGBUR NEGATIVE 02/12/2018  1035   BILIRUBINUR NEGATIVE 02/12/2018 1035   KETONESUR 5 (A) 02/12/2018 1035   PROTEINUR 100 (A) 02/12/2018 1035   NITRITE NEGATIVE 02/12/2018 1035   LEUKOCYTESUR NEGATIVE 02/12/2018 1035      Imaging Results:    DG Chest Port 1 View  Result Date: 03/18/2019 CLINICAL DATA:  SOB x 2 days. Hx DM, HTN, CAD. Pt being tested for COVID by respiratory panel. EXAM: PORTABLE CHEST 1 VIEW COMPARISON:  Chest radiograph 02/06/2018 FINDINGS: Stable cardiomediastinal contours with enlarged heart size. Aortic arch calcification. There are diffuse bilateral interstitial and alveolar opacities which  could reflect infection or edema. No pneumothorax or large pleural effusion. No acute finding in the visualized skeleton. IMPRESSION: Diffuse bilateral interstitial and alveolar opacities which could reflect infection or edema. Electronically Signed   By: Audie Pinto M.D.   On: 03/18/2019 15:39    My personal review of EKG: Rhythm NSR, Rate 60 /min, QTc 456 ,no Acute ST changes   Assessment & Plan:    Active Problems:   * No active hospital problems. *   1. Acute hypoxic respiratory failure 1. ER provider reports upper 80s oxygen saturation at time of arrival, patient saturating well on 2 L nasal cannula 2. Chest x-ray shows diffuse bilateral interstitial and alveolar opacities that could be infection or edema 3. EKG shows no ischemic changes 4. Serial troponins to rule out cardiac etiology of acute hypoxic respiratory failure 5. Covid, flu A, flu B are negative 6. Hypoxia without unilateral swelling, tachycardia, hypotension: Patient cannot have CTA PE study today due to AKI, but given the clinical picture it is safe to wait for VQ scan tomorrow 2. AKI 1. Baseline creatinine 1.5-2.0 2. Baseline BUN around 30 3. Hyperkalemia at 6.3 4. Today creatinine is 2.27, BUN is 41 5. Patient recently on Bactrim -may be contributing to acute elevation in creatinine 6. 1 L LR given in the ED 7. BUN to  creatinine ratio does not indicate dehydration 8. Avoid nephrotoxic agents when possible 9. Trend with CMP in the a.m. 10. UA pending to see if urinary tract infection is contributing 3. Hyperkalemia 1. Albuterol, insulin, sodium bicarb given in ED 2. Repeat potassium now, trend potassium in a.m. on CMP 3. Low potassium diet while here in the hospital 4. Consider Lasix if repeat potassium indicates need for further intervention 5. Consider calcium gluconate for cardiac protection if potassium is still elevated 6. EKG shows no hyperkalemic changes 7. Monitor on telemetry 4. Community acquired pneumonia 1. Chest x-ray shows opacities that could reflect infection 2. Cover with Rocephin and Zithromax 3. Patient is afebrile with no leukocytosis 4. Repeat chest x-ray in the a.m. -patient should be adequately hydrated with the IV fluids that were given in the ED 5. If no clear infiltrate consider stopping the antibiotics 5. Elevated BNP 1. BNP at 225 2. No crackles, but patient does have 2+ peripheral edema 3. No Lasix indicated at this time 4. Continue to monitor 6. Thyroid disease 1. Continue home medication 2. Check TSH in a.m. 7. Diabetes mellitus type 2 1. Patient on 12 units of Lantus per med rec 2. In the hospital on carb modified diet we will continue 10 units of Lantus 3. Sliding scale coverage 4. Last hemoglobin A1c was 9.6 -318 days ago, check hemoglobin A1c 5. Continue to monitor 8.    DVT Prophylaxis-   Heparin- SCDs   AM Labs Ordered, also please review Full Orders  Family Communication: No family at bedside  Code Status: Full  Admission status:Inpatient: Based on patients clinical presentation and evaluation of above clinical data, I have made determination that patient meets Inpatient criteria at this time. Time spent in minutes : [place]

## 2019-03-18 NOTE — ED Notes (Signed)
Pt moved to hospital bed for comfort.

## 2019-03-19 ENCOUNTER — Inpatient Hospital Stay (HOSPITAL_COMMUNITY): Payer: Medicare Other

## 2019-03-19 ENCOUNTER — Encounter (HOSPITAL_COMMUNITY): Payer: Self-pay | Admitting: Family Medicine

## 2019-03-19 DIAGNOSIS — J189 Pneumonia, unspecified organism: Principal | ICD-10-CM

## 2019-03-19 DIAGNOSIS — J9601 Acute respiratory failure with hypoxia: Secondary | ICD-10-CM | POA: Diagnosis present

## 2019-03-19 LAB — HEMOGLOBIN A1C
Hgb A1c MFr Bld: 7.3 % — ABNORMAL HIGH (ref 4.8–5.6)
Mean Plasma Glucose: 162.81 mg/dL

## 2019-03-19 LAB — CBC
HCT: 36.5 % (ref 36.0–46.0)
Hemoglobin: 11.1 g/dL — ABNORMAL LOW (ref 12.0–15.0)
MCH: 30 pg (ref 26.0–34.0)
MCHC: 30.4 g/dL (ref 30.0–36.0)
MCV: 98.6 fL (ref 80.0–100.0)
Platelets: 217 10*3/uL (ref 150–400)
RBC: 3.7 MIL/uL — ABNORMAL LOW (ref 3.87–5.11)
RDW: 14.3 % (ref 11.5–15.5)
WBC: 10.3 10*3/uL (ref 4.0–10.5)
nRBC: 0 % (ref 0.0–0.2)

## 2019-03-19 LAB — BASIC METABOLIC PANEL
Anion gap: 8 (ref 5–15)
BUN: 43 mg/dL — ABNORMAL HIGH (ref 8–23)
CO2: 19 mmol/L — ABNORMAL LOW (ref 22–32)
Calcium: 8.6 mg/dL — ABNORMAL LOW (ref 8.9–10.3)
Chloride: 111 mmol/L (ref 98–111)
Creatinine, Ser: 2.03 mg/dL — ABNORMAL HIGH (ref 0.44–1.00)
GFR calc Af Amer: 25 mL/min — ABNORMAL LOW (ref >60)
GFR calc non Af Amer: 21 mL/min — ABNORMAL LOW (ref >60)
Glucose, Bld: 101 mg/dL — ABNORMAL HIGH (ref 70–99)
Potassium: 6.6 mmol/L (ref 3.5–5.1)
Sodium: 138 mmol/L (ref 135–145)

## 2019-03-19 LAB — CBG MONITORING, ED
Glucose-Capillary: 165 mg/dL — ABNORMAL HIGH (ref 70–99)
Glucose-Capillary: 139 mg/dL — ABNORMAL HIGH (ref 70–99)

## 2019-03-19 LAB — GLUCOSE, CAPILLARY
Glucose-Capillary: 87 mg/dL (ref 70–99)
Glucose-Capillary: 95 mg/dL (ref 70–99)

## 2019-03-19 LAB — MAGNESIUM: Magnesium: 2.5 mg/dL — ABNORMAL HIGH (ref 1.7–2.4)

## 2019-03-19 LAB — TSH: TSH: 1.557 u[IU]/mL (ref 0.350–4.500)

## 2019-03-19 MED ORDER — TECHNETIUM TO 99M ALBUMIN AGGREGATED
1.5000 | Freq: Once | INTRAVENOUS | Status: AC | PRN
  Administered 2019-03-19: 1.45 via INTRAVENOUS

## 2019-03-19 MED ORDER — SODIUM CHLORIDE 0.9 % IV SOLN
INTRAVENOUS | Status: DC | PRN
  Administered 2019-03-19: 250 mL via INTRAVENOUS

## 2019-03-19 MED ORDER — DEXTROSE 50 % IV SOLN
1.0000 | Freq: Once | INTRAVENOUS | Status: AC
  Administered 2019-03-19: 50 mL via INTRAVENOUS
  Filled 2019-03-19: qty 50

## 2019-03-19 MED ORDER — CALCIUM GLUCONATE-NACL 1-0.675 GM/50ML-% IV SOLN
1.0000 g | Freq: Once | INTRAVENOUS | Status: AC
  Administered 2019-03-19: 1000 mg via INTRAVENOUS
  Filled 2019-03-19: qty 50

## 2019-03-19 MED ORDER — INSULIN ASPART 100 UNIT/ML IV SOLN
10.0000 [IU] | Freq: Once | INTRAVENOUS | Status: AC
  Administered 2019-03-19: 10 [IU] via INTRAVENOUS

## 2019-03-19 NOTE — Progress Notes (Signed)
K 6.6, ordered treatment. Ordered EKG to check for spiked Twaves. RN called abnormal EKG. Reviewed EKG which computer ruled was a "STEMI". NP saw no signs of this on EKG. No peaked Twaves. Called [facility]. at [facility] to have him review EKG and agreed with NP.  KJKG, NP [facility]

## 2019-03-19 NOTE — Progress Notes (Signed)
PROGRESS NOTE  Regina Bush WJX:[id number] DOB: 26-Jul-1927 DOA: 03/18/2019 PCP: Doree Albee, MD  Brief History   86 year old woman resident of [facility] PMH including subdural hematoma, diabetes mellitus type 2, dementia presented with shortness of breath.  Admitted for acute hypoxic respiratory failure, pneumonia Covid negative, acute kidney injury  A & P  Acute hypoxic respiratory failure secondary to bilateral pneumonia, SARS-CoV-2 negative.  Influenza negative.  Lateral swelling noted so V/Q was checked. --Feels somewhat better today.  Continue empiric antibiotics.  Wean oxygen as tolerated.  Acute kidney injury with hyperkalemia, recently on Bactrim --Hyperkalemia resolved. --IV fluids.  Strict I's/O.  Check BMP in a.m.  Diabetes mellitus type 2 --CBG stable.  Continue Lantus and sliding scale insulin.  Chronic diastolic CHF --Appears euvolemic.  Aortic atherosclerosis  DVT prophylaxis: heparin Code Status: Full Family Communication: none Disposition Plan: Return to Peter Congo, MD  [facility] Direct contact: see [email or web address] (further directions at bottom of note if needed) 7PM-7AM contact night coverage as at bottom of note 03/19/2019, 7:23 PM  LOS: 1 day   Significant Hospital Events   . 1/3 admitted for bilateral pneumonia SARS-CoV-2 negative   Consults:  .    Procedures:  .   Significant Diagnostic Tests:  . SARS-CoV-2 negative . EKG sinus rhythm, right bundle branch block (old), poor quality EKG, no acute changes seen . 1/4 chest x-ray interstitial opacities slightly improved atypical infection or pulmonary edema improving . VQ scan low probability   Micro Data:  . SARS-CoV-2 negative   Antimicrobials:  . Azithromycin 1/3 . Ceftriaxone 1/3  Interval History/Subjective  Feels okay, breathing okay but not back to baseline.  Still short of breath.  Objective   Vitals:  Vitals:   03/19/19 1600 03/19/19 1638  BP: (!) 143/51  (!) 155/52  Pulse:  69  Resp:  16  Temp:  97.6 F (36.4 C)  SpO2:  93%    Exam:  Constitutional.  Appears calm, mildly uncomfortable, nontoxic. Respiratory.  Clear to auscultation bilaterally.  No wheezes, rales or rhonchi.  Normal respiratory effort. Cardiovascular.  Regular rate and rhythm.  No murmur, rub or gallop.  No lower extremity edema. Psychiatric.  Grossly normal mood and affect.  Speech fluent and appropriate.  I have personally reviewed the following:   CBG stable. CBC stable. Scheduled Meds: . amLODipine  10 mg Oral Daily  . atorvastatin  20 mg Oral Daily  . famotidine  20 mg Oral Daily  . heparin  5,000 Units Subcutaneous Q8H  . hydrALAZINE  25 mg Oral TID  . insulin aspart  0-15 Units Subcutaneous TID WC  . insulin glargine  10 Units Subcutaneous QHS  . levothyroxine  50 mcg Oral QAC breakfast  . traZODone  50 mg Oral QHS   Continuous Infusions: . azithromycin Stopped (03/19/19 0239)  . cefTRIAXone (ROCEPHIN)  IV Stopped (03/19/19 0155)    Principal Problem:   Acute respiratory failure with hypoxia (HCC) Active Problems:   Diabetes mellitus without complication ([place])   AKI (acute kidney injury) ([place])   Pneumonia   LOS: 1 day   How to contact the [facility] Attending or Consulting provider 7A - 7P or covering provider during after hours [place], for this patient?  1. Check the care team in [facility] and look for a) attending/consulting TR[facility] provider listed and b) the [facility] team listed 2. Log into [email or web address] and use [place]'s universal password to access. If you do not have the password, please  contact the hospital operator. 3. Locate the [facility] provider you are looking for under [facility] and page to a number that you can be directly reached. 4. If you still have difficulty reaching the provider, please page the [facility] (Director on Call) for the Hospitalists listed on amion for assistance.

## 2019-03-19 NOTE — ED Notes (Signed)
Pt's IV infiltrated, pt has redness and swelling to right hand; pt given ice pack and pain medication

## 2019-03-20 ENCOUNTER — Other Ambulatory Visit: Payer: Self-pay

## 2019-03-20 ENCOUNTER — Inpatient Hospital Stay (HOSPITAL_COMMUNITY): Payer: Medicare Other

## 2019-03-20 DIAGNOSIS — E119 Type 2 diabetes mellitus without complications: Secondary | ICD-10-CM

## 2019-03-20 DIAGNOSIS — E875 Hyperkalemia: Secondary | ICD-10-CM

## 2019-03-20 LAB — BASIC METABOLIC PANEL
Anion gap: 5 (ref 5–15)
Anion gap: 6 (ref 5–15)
BUN: 41 mg/dL — ABNORMAL HIGH (ref 8–23)
BUN: 39 mg/dL — ABNORMAL HIGH (ref 8–23)
CO2: 23 mmol/L (ref 22–32)
CO2: 21 mmol/L — ABNORMAL LOW (ref 22–32)
Calcium: 8.9 mg/dL (ref 8.9–10.3)
Calcium: 8.6 mg/dL — ABNORMAL LOW (ref 8.9–10.3)
Chloride: 113 mmol/L — ABNORMAL HIGH (ref 98–111)
Chloride: 112 mmol/L — ABNORMAL HIGH (ref 98–111)
Creatinine, Ser: 2.02 mg/dL — ABNORMAL HIGH (ref 0.44–1.00)
Creatinine, Ser: 1.95 mg/dL — ABNORMAL HIGH (ref 0.44–1.00)
GFR calc Af Amer: 25 mL/min — ABNORMAL LOW (ref >60)
GFR calc Af Amer: 26 mL/min — ABNORMAL LOW (ref >60)
GFR calc non Af Amer: 21 mL/min — ABNORMAL LOW (ref >60)
GFR calc non Af Amer: 22 mL/min — ABNORMAL LOW (ref >60)
Glucose, Bld: 77 mg/dL (ref 70–99)
Glucose, Bld: 129 mg/dL — ABNORMAL HIGH (ref 70–99)
Potassium: 5.5 mmol/L — ABNORMAL HIGH (ref 3.5–5.1)
Potassium: 5.5 mmol/L — ABNORMAL HIGH (ref 3.5–5.1)
Sodium: 141 mmol/L (ref 135–145)
Sodium: 139 mmol/L (ref 135–145)

## 2019-03-20 LAB — CBC
HCT: 36.1 % (ref 36.0–46.0)
Hemoglobin: 10.8 g/dL — ABNORMAL LOW (ref 12.0–15.0)
MCH: 30.4 pg (ref 26.0–34.0)
MCHC: 29.9 g/dL — ABNORMAL LOW (ref 30.0–36.0)
MCV: 101.7 fL — ABNORMAL HIGH (ref 80.0–100.0)
Platelets: 226 10*3/uL (ref 150–400)
RBC: 3.55 MIL/uL — ABNORMAL LOW (ref 3.87–5.11)
RDW: 14.5 % (ref 11.5–15.5)
WBC: 13.6 10*3/uL — ABNORMAL HIGH (ref 4.0–10.5)
nRBC: 0 % (ref 0.0–0.2)

## 2019-03-20 LAB — BLOOD GAS, ARTERIAL
Acid-base deficit: 0.7 mmol/L (ref 0.0–2.0)
Bicarbonate: 23.5 mmol/L (ref 20.0–28.0)
FIO2: 36
O2 Saturation: 92.9 %
Patient temperature: 37
pCO2 arterial: 43.5 mmHg (ref 32.0–48.0)
pH, Arterial: 7.36 (ref 7.350–7.450)
pO2, Arterial: 68.4 mmHg — ABNORMAL LOW (ref 83.0–108.0)

## 2019-03-20 LAB — GLUCOSE, CAPILLARY
Glucose-Capillary: 68 mg/dL — ABNORMAL LOW (ref 70–99)
Glucose-Capillary: 119 mg/dL — ABNORMAL HIGH (ref 70–99)
Glucose-Capillary: 129 mg/dL — ABNORMAL HIGH (ref 70–99)
Glucose-Capillary: 118 mg/dL — ABNORMAL HIGH (ref 70–99)

## 2019-03-20 MED ORDER — FUROSEMIDE 10 MG/ML IJ SOLN
20.0000 mg | Freq: Once | INTRAMUSCULAR | Status: AC
  Administered 2019-03-20: 20 mg via INTRAVENOUS

## 2019-03-20 MED ORDER — SODIUM CHLORIDE 0.9 % IV SOLN: INTRAVENOUS | Status: DC

## 2019-03-20 MED ORDER — INSULIN GLARGINE 100 UNIT/ML ~~LOC~~ SOLN
8.0000 [IU] | Freq: Every day | SUBCUTANEOUS | Status: DC
  Administered 2019-03-20: 8 [IU] via SUBCUTANEOUS
  Filled 2019-03-20 (×2): qty 0.08

## 2019-03-20 MED ORDER — FUROSEMIDE 10 MG/ML IJ SOLN
INTRAMUSCULAR | Status: AC
  Filled 2019-03-20: qty 2

## 2019-03-20 NOTE — Care Management Important Message (Signed)
Important Message  Patient Details  Name: Regina Bush MRN: [id number] Date of Birth: 11/28/1927   Medicare Important Message Given:  Yes([place], RN agreed to deliver letter to patient)     [name] 03/20/2019, 2:55 PM

## 2019-03-20 NOTE — Progress Notes (Signed)
Inpatient Diabetes Program Recommendations  AACE/ADA: New Consensus Statement on Inpatient Glycemic Control   Target Ranges:  Prepandial:   less than 140 mg/dL      Peak postprandial:   less than 180 mg/dL (1-2 hours)      Critically ill patients:  140 - 180 mg/dL   Results for JANIQUA, FRISCIA (MRN [id number]) as of 03/20/2019 09:10  Ref. Range 03/19/2019 08:24 03/19/2019 11:20 03/19/2019 16:40 03/19/2019 21:31 03/20/2019 07:17  Glucose-Capillary Latest Ref Range: 70 - 99 mg/dL 165 (H) 139 (H) 87 95 68 (L)   Review of Glycemic Control  Diabetes history: DM2 Outpatient Diabetes medications: Lantus 16 units QPM Current orders for Inpatient glycemic control: Lantus 10 units QHS, NOvolog 0-15 units TID with meals  Inpatient Diabetes Program Recommendations:   Insulin - Basal: Fasting glucose 68 mg/dl today. Please consider decreasing Lantus to 8 units QHS.  Thanks, Barnie Alderman, RN, MSN, CDE Diabetes Coordinator Inpatient Diabetes Program [phone] (Team Pager from 8am to 5pm)

## 2019-03-20 NOTE — Progress Notes (Signed)
Asked by Dr. Sarajane Jews to evaluate patient for shortness of breath.  On my arrival, patient is laying in bed, she is on 4 L of oxygen.  She does have increased work of breathing, but is talking on the phone trying to carry on a conversation.  She is having difficulty completing sentences.  On exam, she has bilateral crackles, no wheezing, no significant pedal edema.  86 year old female admitted to the hospital with acute respiratory failure secondary to bilateral pneumonia.  She is currently on ceftriaxone and azithromycin.  She had a VQ scan done yesterday that was low probability for pulmonary embolism.  On admission, she was noted to have acute kidney injury on chronic kidney disease stage III-IV.  She was started on IV hydration with some improvement of renal function.  Creatinine improved from 2.2-1.9.  At this time, is unclear if her respiratory status has worsened secondary to progression of pneumonia versus developing pulmonary edema from IV hydration.  We will hold further IV fluids for now.  Repeat chest x-ray.  Give a DuoNeb treatment as well as a dose of intravenous Lasix.  Check blood gas.  If her respiratory status continues to decline with increased work of breathing, can consider BiPAP.  Updated patient's daughter and son-in-law over the phone.  Confirmed that patient is a DNR.  [employer]

## 2019-03-20 NOTE — Progress Notes (Signed)
PROGRESS NOTE  Regina Bush URK:[id number] DOB: 1927/03/23 DOA: 03/18/2019 PCP: Doree Albee, MD  Brief History   86 year old woman resident of [facility] PMH including subdural hematoma, diabetes mellitus type 2, dementia presented with shortness of breath.  Admitted for acute hypoxic respiratory failure, pneumonia Covid negative, acute kidney injury  A & P  Acute hypoxic respiratory failure secondary to bilateral pneumonia, SARS-CoV-2 negative.  Influenza negative.  Lateral swelling noted so V/Q was checked. --Still short of breath, no clinical improvement today.  She has got coarse breath sounds and continues to require oxygen.  Will continue antibiotics and wean oxygen as tolerated.  Acute kidney injury with hyperkalemia, recently on Bactrim --Recurrent hyperkalemia noted, treated, with significant improvement.  No significant improvement yet in renal function.  Adequate urine output noted. --IV fluids and check BMP this afternoon and in a.m.  Diabetes mellitus type 2 --Mild fasting hypoglycemia.  Decrease Lantus to 8 units nightly.  Chronic diastolic CHF --Appears euvolemic on exam  Aortic atherosclerosis  Discussion.  No significant improvement today, she remains hypoxic she continues to have increased work of breathing, she had recurrent hyperkalemia and acute kidney injury is no better.  She continues to require inpatient treatment and monitoring to prevent deterioration.  DVT prophylaxis: heparin Code Status: Full Family Communication: none Disposition Plan: Return to Peter Congo, MD  [facility] Direct contact: see [email or web address] (further directions at bottom of note if needed) 7PM-7AM contact night coverage as at bottom of note 03/20/2019, 12:17 PM  LOS: 2 days   Significant Hospital Events   . 1/3 admitted for bilateral pneumonia SARS-CoV-2 negative   Consults:  .    Procedures:  .   Significant Diagnostic Tests:  . SARS-CoV-2 negative .  EKG sinus rhythm, right bundle branch block (old), poor quality EKG, no acute changes seen . 1/4 chest x-ray interstitial opacities slightly improved atypical infection or pulmonary edema improving . VQ scan low probability   Micro Data:  . SARS-CoV-2 negative   Antimicrobials:  . Azithromycin 1/3 . Ceftriaxone 1/3  Interval History/Subjective  Hyperkalemic overnight Feels okay, still short of breath, not back to baseline yet.  No nausea or vomiting.  Tolerating diet.  Objective   Vitals:  Vitals:   03/19/19 2131 03/20/19 0530  BP: (!) 138/113 (!) 149/45  Pulse: 68 62  Resp: 16 16  Temp: 98.3 F (36.8 C) 98.7 F (37.1 C)  SpO2: 99% 97%    Exam:  Constitutional.  Appears calm, mildly uncomfortable, nontoxic. Respiratory.  Bilateral coarse breath sounds some crackles.  Mild to moderate increased respiratory effort with mild abdominal breathing.  However she appears to be in no acute distress and speaks easily in sentences. Cardiovascular.  Regular rate and rhythm.  No murmur, rub or gallop.  No lower extremity edema. Psychiatric.  Grossly normal mood and affect.  Speech fluent and appropriate.  I have personally reviewed the following:   Mild hypoglycemia noted Potassium 6.6 > 5.5 BUN slightly better at 41, creatinine no change 2.02 CBC with stable hemoglobin 10.8.  Modest leukocytosis of 13.6 noted.  Scheduled Meds: . amLODipine  10 mg Oral Daily  . atorvastatin  20 mg Oral Daily  . famotidine  20 mg Oral Daily  . heparin  5,000 Units Subcutaneous Q8H  . hydrALAZINE  25 mg Oral TID  . insulin aspart  0-15 Units Subcutaneous TID WC  . insulin glargine  10 Units Subcutaneous QHS  . levothyroxine  50 mcg Oral QAC breakfast  .  traZODone  50 mg Oral QHS   Continuous Infusions: . sodium chloride 250 mL (03/19/19 2319)  . azithromycin 500 mg (03/20/19 0122)  . cefTRIAXone (ROCEPHIN)  IV 1 g (03/20/19 0040)    Principal Problem:   Acute respiratory failure with  hypoxia (HCC) Active Problems:   Diabetes mellitus without complication ([place])   AKI (acute kidney injury) ([place])   Pneumonia   LOS: 2 days   How to contact the [facility] Attending or Consulting provider [place] or covering provider during after hours [place], for this patient?  1. Check the care team in [facility] and look for a) attending/consulting TRH provider listed and b) the [facility] team listed 2. Log into [email or web address] and use [place]'s universal password to access. If you do not have the password, please contact the hospital operator. 3. Locate the [facility] provider you are looking for under [facility] and page to a number that you can be directly reached. 4. If you still have difficulty reaching the provider, please page the [facility] (Director on Call) for the Hospitalists listed on amion for assistance.

## 2019-03-21 ENCOUNTER — Inpatient Hospital Stay (HOSPITAL_COMMUNITY): Payer: Medicare Other

## 2019-03-21 ENCOUNTER — Encounter (HOSPITAL_COMMUNITY): Payer: Self-pay | Admitting: Family Medicine

## 2019-03-21 DIAGNOSIS — Z515 Encounter for palliative care: Secondary | ICD-10-CM

## 2019-03-21 DIAGNOSIS — R0602 Shortness of breath: Secondary | ICD-10-CM

## 2019-03-21 DIAGNOSIS — Z7189 Other specified counseling: Secondary | ICD-10-CM

## 2019-03-21 DIAGNOSIS — R0902 Hypoxemia: Secondary | ICD-10-CM | POA: Diagnosis present

## 2019-03-21 DIAGNOSIS — R0600 Dyspnea, unspecified: Secondary | ICD-10-CM

## 2019-03-21 DIAGNOSIS — E875 Hyperkalemia: Secondary | ICD-10-CM | POA: Diagnosis present

## 2019-03-21 DIAGNOSIS — Z66 Do not resuscitate: Secondary | ICD-10-CM | POA: Diagnosis present

## 2019-03-21 LAB — BASIC METABOLIC PANEL
Anion gap: 8 (ref 5–15)
BUN: 34 mg/dL — ABNORMAL HIGH (ref 8–23)
CO2: 23 mmol/L (ref 22–32)
Calcium: 8.6 mg/dL — ABNORMAL LOW (ref 8.9–10.3)
Chloride: 110 mmol/L (ref 98–111)
Creatinine, Ser: 1.74 mg/dL — ABNORMAL HIGH (ref 0.44–1.00)
GFR calc Af Amer: 30 mL/min — ABNORMAL LOW (ref >60)
GFR calc non Af Amer: 26 mL/min — ABNORMAL LOW (ref >60)
Glucose, Bld: 109 mg/dL — ABNORMAL HIGH (ref 70–99)
Potassium: 5 mmol/L (ref 3.5–5.1)
Sodium: 141 mmol/L (ref 135–145)

## 2019-03-21 LAB — CBC
HCT: 36.2 % (ref 36.0–46.0)
Hemoglobin: 10.8 g/dL — ABNORMAL LOW (ref 12.0–15.0)
MCH: 30.4 pg (ref 26.0–34.0)
MCHC: 29.8 g/dL — ABNORMAL LOW (ref 30.0–36.0)
MCV: 102 fL — ABNORMAL HIGH (ref 80.0–100.0)
Platelets: 193 10*3/uL (ref 150–400)
RBC: 3.55 MIL/uL — ABNORMAL LOW (ref 3.87–5.11)
RDW: 14.3 % (ref 11.5–15.5)
WBC: 9.4 10*3/uL (ref 4.0–10.5)
nRBC: 0 % (ref 0.0–0.2)

## 2019-03-21 LAB — GLUCOSE, CAPILLARY
Glucose-Capillary: 69 mg/dL — ABNORMAL LOW (ref 70–99)
Glucose-Capillary: 102 mg/dL — ABNORMAL HIGH (ref 70–99)
Glucose-Capillary: 164 mg/dL — ABNORMAL HIGH (ref 70–99)
Glucose-Capillary: 87 mg/dL (ref 70–99)
Glucose-Capillary: 164 mg/dL — ABNORMAL HIGH (ref 70–99)

## 2019-03-21 MED ORDER — INSULIN GLARGINE 100 UNIT/ML ~~LOC~~ SOLN
4.0000 [IU] | Freq: Every day | SUBCUTANEOUS | Status: DC
  Administered 2019-03-22: 4 [IU] via SUBCUTANEOUS
  Filled 2019-03-21 (×2): qty 0.04

## 2019-03-21 NOTE — Progress Notes (Signed)
PROGRESS NOTE  Regina Bush IOX:[id number] DOB: [birth year] DOA: 03/18/2019 PCP: Doree Albee, MD  Brief History   86 year old woman resident of [facility] PMH including subdural hematoma, diabetes mellitus type 2, dementia presented with shortness of breath.  Admitted for acute hypoxic respiratory failure, pneumonia Covid negative, acute kidney injury  A & P  Acute hypoxic respiratory failure secondary to bilateral pneumonia, SARS-CoV-2 negative.  Influenza negative. Negative V/Q scan.  --slowly improving.  Will continue antibiotics and wean oxygen as tolerated.  Acute kidney injury with hyperkalemia, recently on Bactrim --Recurrent hyperkalemia noted, treated, with significant improvement.  No significant improvement yet in renal function.   Hyperkalemia - potassium is now in normal limits.    Diabetes mellitus type 2 --Mild fasting hypoglycemia.  Decreased Lantus to 4 units nightly. CBG (last 3)  Recent Labs    03/21/19 0719 03/21/19 0847 03/21/19 1105  GLUCAP 69* 102* 164*   Chronic diastolic CHF --Appears euvolemic on exam  Aortic atherosclerosis  DVT prophylaxis: heparin Code Status: Full Family Communication: none Disposition Plan: monitor for decline in respiratory status, Return to [facility] 1/7 if stable  03/21/2019, 4:03 PM  LOS: 3 days   Significant Hospital Events   . 1/3 admitted for bilateral pneumonia SARS-CoV-2 negative   Consults:  .    Procedures:  .   Significant Diagnostic Tests:  . SARS-CoV-2 negative . EKG sinus rhythm, right bundle branch block (old), poor quality EKG, no acute changes seen . 1/4 chest x-ray interstitial opacities slightly improved atypical infection or pulmonary edema improving . VQ scan low probability   Micro Data:  . SARS-CoV-2 negative   Antimicrobials:  . Azithromycin 1/3 . Ceftriaxone 1/3  Interval History/Subjective  Pt without complaints.   Objective   Vitals:  Vitals:   03/21/19 0525 03/21/19 1339    BP: (!) 157/63 (!) 149/51  Pulse: 62 64  Resp: 18 19  Temp: 99 F (37.2 C) 99 F (37.2 C)  SpO2: 96%    Exam:  Constitutional.  Pt awake, alert, NAD, cooperative.  HEENT: NCAT, neck supple, no JVD.  Respiratory.  No increased WOB.  No rales or wheezing.  Cardiovascular. Normal s1,s2 sounds.   No murmur, rub or gallop. Psychiatric.  Normal affect.   Speech fluent and appropriate. Ext: no cyanosis.   Scheduled Meds: . amLODipine  10 mg Oral Daily  . atorvastatin  20 mg Oral Daily  . famotidine  20 mg Oral Daily  . heparin  5,000 Units Subcutaneous Q8H  . hydrALAZINE  25 mg Oral TID  . insulin aspart  0-15 Units Subcutaneous TID WC  . insulin glargine  4 Units Subcutaneous QHS  . levothyroxine  50 mcg Oral QAC breakfast  . traZODone  50 mg Oral QHS   Continuous Infusions: . sodium chloride 250 mL (03/19/19 2319)  . sodium chloride 35 mL/hr at 03/21/19 0851  . azithromycin 500 mg (03/20/19 2339)  . cefTRIAXone (ROCEPHIN)  IV 1 g (03/20/19 2306)    Principal Problem:   Acute respiratory failure with hypoxia (HCC) Active Problems:   Diabetes mellitus without complication (HCC)   AKI (acute kidney injury) ([place])   Pneumonia   Goals of care, counseling/discussion   Palliative care by specialist   LOS: 3 days   How to contact the [facility] Acute Care Attending or Consulting provider [place] or covering provider during after hours [place], for this patient?  1. Check the care team in [facility] and look for a) attending/consulting [place] provider listed and  b) the [facility] team listed 2. Log into [email or web address] and use [place]'s universal password to access. If you do not have the password, please contact the hospital operator. 3. Locate the [facility] provider you are looking for under [facility] and page to a number that you can be directly reached. 4. If you still have difficulty reaching the provider, please page the [facility] (Director on Call) for the Hospitalists listed on amion for  assistance.

## 2019-03-21 NOTE — Consult Note (Signed)
Consultation Note Date: 03/21/2019   Patient Name: Regina Bush  DOB: [birth year]  MRN: [id number]  Age / Sex: 86 y.o., female  PCP: Doree Albee, MD Referring Physician: Murlean Iba, MD  Reason for Consultation: Establishing goals of care and Psychosocial/spiritual support  HPI/Patient Profile: 86 y.o. female  with past medical history of cerebral infarct with dysphagia, CAD, dementia (early stages), hypertension, thyroid disease, subdural hematoma, arthritis, anemia admitted on 03/18/2019 with acute hypoxic respiratory failure secondary to bilateral pneumonia, acute kidney injury with hyperkalemia.   Clinical Assessment and Goals of Care: Mrs. Haydon is lying quietly in bed.  She is sleeping soundly, but wakes when I touch her arm.  She is oriented to person, place, situation.  She tells me that she is feeling better.  We talked briefly about the treatment plan, and she has no questions.  She agrees for me to call her daughter Butch Penny for update.  Call to daughter, Jacquelyne Balint (Dr. Vickey Sages wife).  Butch Penny shares that Mrs. Gentz does not have dementia, only confusion with acute illness.    We talked in detail about the treatment plan, review selected labs.  Goals are to return to residential ALF with PT if qualified.  Butch Penny states, and I agree, returning to ALF where she knows people and they know her as soon as possible would benefit her. Mrs. Morton lives in [facility], spouse passed in 2020, married 57 years. Daughter is active with health care.  Conference with attending and bedside nursing related to patient condition, needs, goals of care.  HCPOA   NEXT OF KIN - daughter, Jacquelyne Balint    SUMMARY OF RECOMMENDATIONS   Continue to treat the treatable but no extraordinary measures Return to ALF with PT if qualified Rehospitalize as needed (1 hospitalization in the last 6  months)  Code Status/Advance Care Planning:  DNR  Symptom Management:   Per hospitalist, no additional needs at this time.   Palliative Prophylaxis:   Oral Care and Turn Reposition  Additional Recommendations (Limitations, Scope, Preferences):  treat the treatable, no CPR or intubation   Psycho-social/Spiritual:   Desire for further Chaplaincy support:no  Additional Recommendations: Caregiving  Support/Resources and Education on Hospice  Prognosis:   Unable to determine, based on outcomes. Stable.  May be 6-12 months based on 1 hospitalization in 6 months, chronic illness burden.   Discharge Planning: return to established ALF.       Primary Diagnoses: Present on Admission: . AKI (acute kidney injury) ([place])   I have reviewed the medical record, interviewed the patient and family, and examined the patient. The following aspects are pertinent.  Past Medical History:  Diagnosis Date  . Anemia   . Arthritis   . Cerebral infarction ([place])   . Coronary artery disease   . Dementia ([place])   . Diabetes mellitus without complication (HCC)    Type 2  . Dysphagia due to recent cerebral infarction   . Failure to thrive (0-17)   . Hypercalcemia   . Hypertension   .  Pneumonitis   . Protein calorie malnutrition ([place])   . Subdural hematoma ([place])   . Thyroid disease    Social History   Socioeconomic History  . Marital status: Married    Spouse name: Not on file  . Number of children: Not on file  . Years of education: Not on file  . Highest education level: Not on file  Occupational History  . Not on file  Tobacco Use  . Smoking status: Never Smoker  . Smokeless tobacco: Never Used  Substance and Sexual Activity  . Alcohol use: No  . Drug use: No  . Sexual activity: Not Currently  Other Topics Concern  . Not on file  Social History Narrative   Widow,husband passed away 2020,married for 57 years.Retired.   Social Determinants of Health   Financial Resource  Strain:   . Difficulty of Paying Living Expenses: Not on file  Food Insecurity:   . Worried About Charity fundraiser in the Last Year: Not on file  . Ran Out of Food in the Last Year: Not on file  Transportation Needs:   . Lack of Transportation (Medical): Not on file  . Lack of Transportation (Non-Medical): Not on file  Physical Activity:   . Days of Exercise per Week: Not on file  . Minutes of Exercise per Session: Not on file  Stress:   . Feeling of Stress : Not on file  Social Connections:   . Frequency of Communication with Friends and Family: Not on file  . Frequency of Social Gatherings with Friends and Family: Not on file  . Attends Religious Services: Not on file  . Active Member of Clubs or Organizations: Not on file  . Attends Archivist Meetings: Not on file  . Marital Status: Not on file   Family History  Family history unknown: Yes   Scheduled Meds: . amLODipine  10 mg Oral Daily  . atorvastatin  20 mg Oral Daily  . famotidine  20 mg Oral Daily  . heparin  5,000 Units Subcutaneous Q8H  . hydrALAZINE  25 mg Oral TID  . insulin aspart  0-15 Units Subcutaneous TID WC  . insulin glargine  8 Units Subcutaneous QHS  . levothyroxine  50 mcg Oral QAC breakfast  . traZODone  50 mg Oral QHS   Continuous Infusions: . sodium chloride 250 mL (03/19/19 2319)  . sodium chloride 35 mL/hr at 03/21/19 0851  . azithromycin 500 mg (03/20/19 2339)  . cefTRIAXone (ROCEPHIN)  IV 1 g (03/20/19 2306)   PRN Meds:.sodium chloride, acetaminophen **OR** acetaminophen, ipratropium-albuterol, ondansetron **OR** ondansetron (ZOFRAN) IV, polyethylene glycol, traMADol Medications Prior to Admission:  Prior to Admission medications   Medication Sig Start Date End Date Taking? Authorizing Provider  acetaminophen (TYLENOL) 325 MG tablet Take 2 tablets (650 mg total) by mouth every 6 (six) hours as needed for mild pain or moderate pain. 08/25/16  Yes Rosita Fire, MD  amLODipine  (NORVASC) 10 MG tablet Take 10 mg by mouth daily.    Yes [provider]  aspirin EC 81 MG tablet Take 81 mg by mouth daily.   Yes [provider]  atorvastatin (LIPITOR) 20 MG tablet Take 20 mg by mouth daily.    Yes [provider]  cetirizine (ZYRTEC) 10 MG tablet Take 10 mg by mouth at bedtime as needed for allergies.   Yes [provider]  cholecalciferol (VITAMIN D) 1000 units tablet Take 1,000 Units by mouth daily.    Yes  [provider]  famotidine (PEPCID) 20 MG tablet Take 20 mg by mouth daily.   Yes [provider]  fluticasone (FLONASE) 50 MCG/ACT nasal spray Place 2 sprays into both nostrils daily.   Yes [provider]  hydrALAZINE (APRESOLINE) 25 MG tablet Take 25 mg by mouth 3 (three) times daily. Give 1 tablet by mouth three times a day for HTN hold for systolic BP <413   Yes [provider]  insulin glargine (LANTUS) 100 UNIT/ML injection Inject 0.12 mLs (12 Units total) into the skin daily. Patient taking differently: Inject 16 Units into the skin every evening.  02/16/18  Yes Sinda Du, MD  levothyroxine (SYNTHROID, LEVOTHROID) 50 MCG tablet Take 50 mcg by mouth daily before breakfast.    Yes [provider]  magnesium oxide (MAG-OX) 400 MG tablet Take 400 mg by mouth daily.    Yes [provider]  Multiple Vitamin (MULTIVITAMIN WITH MINERALS) TABS tablet Take 1 tablet by mouth daily.    Yes [provider]  tamsulosin (FLOMAX) 0.4 MG CAPS capsule Take 0.4 mg by mouth every evening.   Yes [provider]  traZODone (DESYREL) 50 MG tablet Take 50 mg by mouth at bedtime.   Yes [provider]  trimethoprim (TRIMPEX) 100 MG tablet Take 100 mg by mouth daily. 03/14/19  Yes [provider]  Wheat Dextrin (BENEFIBER PO) Take 10 mLs by mouth daily.    Yes [provider]  ketoconazole (NIZORAL) 2 % cream Apply 1 application topically daily. Apply to  abdominal folds under breast, groin area and sacrum area where redness and rash is located report to provider if no improvements is noted.    [provider]   Allergies  Allergen Reactions  . Cymbalta [Duloxetine Hcl] Other (See Comments)    unknown  . Effexor [Venlafaxine] Other (See Comments)    unknown  . Morphine And Related Other (See Comments)    Passed out   . Tape     adhesive   Review of Systems  Unable to perform ROS: Age    Physical Exam Vitals and nursing note reviewed.  Constitutional:      General: She is not in acute distress.    Appearance: She is obese.  HENT:     Head: Atraumatic.  Cardiovascular:     Rate and Rhythm: Normal rate.  Pulmonary:     Effort: Pulmonary effort is normal. No tachypnea or accessory muscle usage.  Abdominal:     Palpations: Abdomen is soft.  Skin:    General: Skin is warm and dry.  Neurological:     Comments: Known dementia, oriented to self, place and situation.   Psychiatric:     Comments: Calm and cooperative, pleasant      Vital Signs: BP (!) 157/63 (BP Location: Right Arm)   Pulse 62   Temp 99 F (37.2 C) (Oral)   Resp 18   Ht 5\' 3"  (1.6 m)   Wt 81.6 kg   SpO2 96%   BMI 31.89 kg/m  Pain Scale: 0-10   Pain Score: 0-No pain   SpO2: SpO2: 96 % O2 Device:SpO2: 96 % O2 Flow Rate: .O2 Flow Rate (L/min): 4 L/min  IO: Intake/output summary:   Intake/Output Summary (Last 24 hours) at 03/21/2019 1214 Last data filed at 03/21/2019 0900 Gross per 24 hour  Intake 776.67 ml  Output 800 ml  Net -23.33 ml    LBM: Last BM Date: 03/18/19 Baseline Weight: Weight: 81.6 kg  Most recent weight: Weight: 81.6 kg     Palliative Assessment/Data:   Flowsheet Rows     Most Recent Value  Intake Tab  Unit at Time of Referral  Cardiac/Telemetry Unit  Palliative Care Primary Diagnosis  Pulmonary  Date Notified  03/21/19  Palliative Care Type  New Palliative care  Reason for referral  Clarify Goals of Care  Date of  Admission  03/18/19  Date first seen by Palliative Care  03/21/19  # of days Palliative referral response time  0 Day(s)  # of days IP prior to Palliative referral  3  Clinical Assessment  Palliative Performance Scale Score  40%  Pain Max last 24 hours  Not able to report  Pain Min Last 24 hours  Not able to report  Dyspnea Max Last 24 Hours  Not able to report  Dyspnea Min Last 24 hours  Not able to report  Psychosocial & Spiritual Assessment  Palliative Care Outcomes      Time In: 1005 Time Out: 1055 Time Total: 50 minutes   Greater than 50%  of this time was spent counseling and coordinating care related to the above assessment and plan.  Signed by: Drue Novel, NP   Please contact Palliative Medicine Team phone at [phone] for questions and concerns.  For individual provider: See Shea Evans

## 2019-03-21 NOTE — Progress Notes (Addendum)
Inpatient Diabetes Program Recommendations  AACE/ADA: New Consensus Statement on Inpatient Glycemic Control   Target Ranges:  Prepandial:   less than 140 mg/dL      Peak postprandial:   less than 180 mg/dL (1-2 hours)      Critically ill patients:  140 - 180 mg/dL   Results for Regina Bush, Regina Bush (MRN [id number]) as of 03/21/2019 11:50  Ref. Range 03/20/2019 07:17 03/20/2019 11:03 03/20/2019 16:22 03/20/2019 20:41 03/21/2019 07:19 03/21/2019 08:47 03/21/2019 11:05  Glucose-Capillary Latest Ref Range: 70 - 99 mg/dL 68 (L) 119 (H) 129 (H) 118 (H) 69 (L) 102 (H) 164 (H)   Review of Glycemic Control  Diabetes history: DM2 Outpatient Diabetes medications: Lantus 16 units QPM Current orders for Inpatient glycemic control: Lantus 8 units QHS, Novolog 0-15 units TID with meals  Inpatient Diabetes Program Recommendations:   Insulin - Basal: Fasting glucose 69 mg/dl today. Please consider decreasing Lantus to 5 units QHS.  Thanks, Barnie Alderman, RN, MSN, CDE Diabetes Coordinator Inpatient Diabetes Program [phone] (Team Pager from 8am to 5pm)

## 2019-03-22 DIAGNOSIS — Z66 Do not resuscitate: Secondary | ICD-10-CM

## 2019-03-22 LAB — BASIC METABOLIC PANEL
Anion gap: 5 (ref 5–15)
BUN: 31 mg/dL — ABNORMAL HIGH (ref 8–23)
CO2: 24 mmol/L (ref 22–32)
Calcium: 8.5 mg/dL — ABNORMAL LOW (ref 8.9–10.3)
Chloride: 113 mmol/L — ABNORMAL HIGH (ref 98–111)
Creatinine, Ser: 1.73 mg/dL — ABNORMAL HIGH (ref 0.44–1.00)
GFR calc Af Amer: 30 mL/min — ABNORMAL LOW (ref >60)
GFR calc non Af Amer: 26 mL/min — ABNORMAL LOW (ref >60)
Glucose, Bld: 112 mg/dL — ABNORMAL HIGH (ref 70–99)
Potassium: 4.8 mmol/L (ref 3.5–5.1)
Sodium: 142 mmol/L (ref 135–145)

## 2019-03-22 LAB — CBC
HCT: 36.1 % (ref 36.0–46.0)
Hemoglobin: 10.8 g/dL — ABNORMAL LOW (ref 12.0–15.0)
MCH: 30.5 pg (ref 26.0–34.0)
MCHC: 29.9 g/dL — ABNORMAL LOW (ref 30.0–36.0)
MCV: 102 fL — ABNORMAL HIGH (ref 80.0–100.0)
Platelets: 181 10*3/uL (ref 150–400)
RBC: 3.54 MIL/uL — ABNORMAL LOW (ref 3.87–5.11)
RDW: 14.1 % (ref 11.5–15.5)
WBC: 7.7 10*3/uL (ref 4.0–10.5)
nRBC: 0 % (ref 0.0–0.2)

## 2019-03-22 LAB — GLUCOSE, CAPILLARY
Glucose-Capillary: 101 mg/dL — ABNORMAL HIGH (ref 70–99)
Glucose-Capillary: 329 mg/dL — ABNORMAL HIGH (ref 70–99)

## 2019-03-22 MED ORDER — TRIMETHOPRIM 100 MG PO TABS: 100.0000 mg | ORAL_TABLET | Freq: Every day | ORAL | Status: DC

## 2019-03-22 MED ORDER — HYDRALAZINE HCL 25 MG PO TABS
50.0000 mg | ORAL_TABLET | Freq: Three times a day (TID) | ORAL | Status: DC
  Administered 2019-03-22: 50 mg via ORAL
  Filled 2019-03-22: qty 2

## 2019-03-22 MED ORDER — INSULIN GLARGINE 100 UNIT/ML ~~LOC~~ SOLN: 4.0000 [IU] | Freq: Every day | SUBCUTANEOUS | 11 refills | Status: DC

## 2019-03-22 MED ORDER — DOXYCYCLINE HYCLATE 100 MG PO CAPS: 100.0000 mg | ORAL_CAPSULE | Freq: Two times a day (BID) | ORAL | 0 refills | Status: DC

## 2019-03-22 MED ORDER — DOXYCYCLINE HYCLATE 100 MG PO CAPS: 100.0000 mg | ORAL_CAPSULE | Freq: Two times a day (BID) | ORAL | 0 refills | Status: AC

## 2019-03-22 NOTE — Progress Notes (Signed)
Palliative: Regina Bush is lying quietly in bed.  She wakes easily when I enter the room.  She is calm and cooperative, looks somewhat improved today.  No questions or concerns, states that she is feeling better.  Plan:   Continue to treat the treatable but no extraordinary measures, return to ALF, return to the hospital as needed.  No charge  Regina Axe, NP Palliative Medicine Team Team Phone # [phone] Greater than 50% of this time was spent counseling and coordinating care related to the above assessment and plan.

## 2019-03-22 NOTE — Progress Notes (Signed)
Patient discharging back to [facility]. Called report to Janett Billow, receiving nurse at facility. States they will pick patient up around 3 pm today. AVS printed to send with patient at discharge. Patient in stable condition awaiting discharge. Donavan Foil, RN

## 2019-03-22 NOTE — Progress Notes (Signed)
Patient left floor via w/c accompanied by staff member for discharge. Transported by [facility] staff. Discharge AVS, DNR and prescription sent with patient. Donavan Foil, RN

## 2019-03-22 NOTE — NC FL2 (Addendum)
[place] LEVEL OF CARE SCREENING TOOL     IDENTIFICATION  Patient Name: Regina Bush Birthdate: 1927/11/24 Sex: female Admission Date (Current Location): 03/18/2019  [facility] and [place] Number:  [employer] and Address:  [place] [address], [place]      Provider Number: [id number]  Attending Physician Name and Address:  Murlean Iba, MD  Relative Name and Phone Number:  Jacquelyne Balint  [phone]    Current Level of Care: Hospital Recommended Level of Care: [place] Prior Approval Number:    Date Approved/Denied:   PASRR Number: [id number]  Discharge Plan: Domiciliary (Rest home)    Current Diagnoses: Patient Active Problem List   Diagnosis Date Noted  . DNR (do not resuscitate) 03/21/2019  . Goals of care, counseling/discussion   . Palliative care by specialist   . Dyspnea   . Hyperkalemia   . Hypoxia   . Shortness of breath   . Acute respiratory failure with hypoxia ([place]) 03/19/2019  . Pneumonia 03/19/2019  . AKI (acute kidney injury) ([place]) 03/18/2019  . Urinary retention 02/20/2018  . Hypothyroidism 11/19/2016  . History of CVA in adulthood 11/19/2016  . UTI (urinary tract infection) 11/15/2016  . ARF (acute renal failure) ([place]) 11/15/2016  . Hyponatremia 11/15/2016  . HCAP (healthcare-associated pneumonia) 08/19/2016  . Subdural hematoma ([place])   . Hypertension   . Diabetes mellitus without complication ([place])   . Coronary artery disease     Orientation RESPIRATION BLADDER Height & Weight     Self  Normal Continent Weight: 81.6 kg Height:  5\' 3"  (160 cm)  BEHAVIORAL SYMPTOMS/MOOD NEUROLOGICAL BOWEL NUTRITION STATUS      Continent Diet   Renal/carb modified with fluid restrictions  AMBULATORY STATUS COMMUNICATION OF NEEDS Skin   Extensive Assist Verbally Normal                       Personal Care Assistance Level of Assistance  Bathing, Dressing, Feeding  Bathing Assistance: Limited assistance Feeding assistance: Independent Dressing Assistance: Limited assistance     Functional Limitations Info  Sight, Hearing, Speech Sight Info: Adequate Hearing Info: Adequate      SPECIAL CARE FACTORS FREQUENCY   PT    5 times a week                Contractures Contractures Info: Not present    Additional Factors Info  Code Status, Allergies Code Status Info: DNR Allergies Info: cymbaltal  effexor  morphine and tape           Current Medications (03/22/2019):  This is the current hospital active medication list Current Facility-Administered Medications  Medication Dose Route Frequency Provider Last Rate Last Admin  . 0.9 %  sodium chloride infusion   Intravenous PRN Roxan Hockey, MD 10 mL/hr at 03/19/19 2319 250 mL at 03/19/19 2319  . 0.9 %  sodium chloride infusion   Intravenous Continuous Wynetta Emery, Clanford L, MD 35 mL/hr at 03/21/19 0851 Rate Change at 03/21/19 0851  . acetaminophen (TYLENOL) tablet 650 mg  650 mg Oral Q6H PRN Zierle-Ghosh, Asia B, DO       Or  . acetaminophen (TYLENOL) suppository 650 mg  650 mg Rectal Q6H PRN Zierle-Ghosh, Asia B, DO      . amLODipine (NORVASC) tablet 10 mg  10 mg Oral Daily Zierle-Ghosh, Asia B, DO   10 mg at 03/21/19 0850  . atorvastatin (LIPITOR) tablet 20 mg  20 mg Oral Daily Zierle-Ghosh, Asia B, DO   20 mg at 03/21/19 0850  . azithromycin (ZITHROMAX) 500 mg in sodium chloride 0.9 % 250 mL IVPB  500 mg Intravenous Q24H Zierle-Ghosh, Asia B, DO 250 mL/hr at 03/22/19 0146 500 mg at 03/22/19 0146  . cefTRIAXone (ROCEPHIN) 1 g in sodium chloride 0.9 % 100 mL IVPB  1 g Intravenous Q24H Zierle-Ghosh, Asia B, DO 200 mL/hr at 03/22/19 0300 1 g at 03/22/19 0300  . famotidine (PEPCID) tablet 20 mg  20 mg Oral Daily Zierle-Ghosh, Asia B, DO   20 mg at 03/21/19 0850  . heparin injection 5,000 Units  5,000 Units Subcutaneous Q8H Zierle-Ghosh, Asia B, DO   5,000 Units at 03/22/19 0514  . hydrALAZINE  (APRESOLINE) tablet 50 mg  50 mg Oral TID Johnson, Clanford L, MD      . insulin aspart (novoLOG) injection 0-15 Units  0-15 Units Subcutaneous TID WC Zierle-Ghosh, Asia B, DO   3 Units at 03/21/19 1216  . insulin glargine (LANTUS) injection 4 Units  4 Units Subcutaneous QHS Wynetta Emery, Clanford L, MD   4 Units at 03/22/19 0158  . ipratropium-albuterol (DUONEB) 0.5-2.5 (3) MG/3ML nebulizer solution 3 mL  3 mL Nebulization Q6H PRN Zierle-Ghosh, Asia B, DO   3 mL at 03/20/19 1814  . levothyroxine (SYNTHROID) tablet 50 mcg  50 mcg Oral QAC breakfast Zierle-Ghosh, Asia B, DO   50 mcg at 03/22/19 0515  . ondansetron (ZOFRAN) tablet 4 mg  4 mg Oral Q6H PRN Zierle-Ghosh, Asia B, DO       Or  . ondansetron (ZOFRAN) injection 4 mg  4 mg Intravenous Q6H PRN Zierle-Ghosh, Asia B, DO      . polyethylene glycol (MIRALAX / GLYCOLAX) packet 17 g  17 g Oral Daily PRN Zierle-Ghosh, Asia B, DO      . traMADol (ULTRAM) tablet 50 mg  50 mg Oral Q6H PRN Zierle-Ghosh, Asia B, DO   50 mg at 03/19/19 0259  . traZODone (DESYREL) tablet 50 mg  50 mg Oral QHS Zierle-Ghosh, Asia B, DO   50 mg at 03/21/19 2257     Discharge Medications: Medication List    TAKE these medications   acetaminophen 325 MG tablet Commonly known as: TYLENOL Take 2 tablets (650 mg total) by mouth every 6 (six) hours as needed for mild pain or moderate pain.   amLODipine 10 MG tablet Commonly known as: NORVASC Take 10 mg by mouth daily.   aspirin EC 81 MG tablet Take 81 mg by mouth daily.   atorvastatin 20 MG tablet Commonly known as: LIPITOR Take 20 mg by mouth daily.   BENEFIBER PO Take 10 mLs by mouth daily.   cetirizine 10 MG tablet Commonly known as: ZYRTEC Take 10 mg by mouth at bedtime as needed for allergies.   cholecalciferol 25 MCG (1000 UT) tablet Commonly known as: VITAMIN D Take 1,000 Units by mouth daily.   doxycycline 100 MG capsule Commonly known as: VIBRAMYCIN Take 1 capsule (100 mg total) by mouth 2  (two) times daily for 2 days.   famotidine 20 MG tablet Commonly known as: PEPCID Take 20 mg by mouth daily.   fluticasone 50 MCG/ACT nasal spray Commonly known as: FLONASE Place 2 sprays into both nostrils daily.   hydrALAZINE 25 MG tablet Commonly known as: APRESOLINE Take 25 mg by mouth 3 (three) times daily. Give 1 tablet by mouth three times a day for HTN hold for systolic BP <527  insulin glargine 100 UNIT/ML injection Commonly known as: LANTUS Inject 0.04 mLs (4 Units total) into the skin at bedtime. What changed:   how much to take  when to take this   ketoconazole 2 % cream Commonly known as: NIZORAL Apply 1 application topically daily. Apply to abdominal folds under breast, groin area and sacrum area where redness and rash is located report to provider if no improvements is noted.   levothyroxine 50 MCG tablet Commonly known as: SYNTHROID Take 50 mcg by mouth daily before breakfast.   magnesium oxide 400 MG tablet Commonly known as: MAG-OX Take 400 mg by mouth daily.   multivitamin with minerals Tabs tablet Take 1 tablet by mouth daily.   tamsulosin 0.4 MG Caps capsule Commonly known as: FLOMAX Take 0.4 mg by mouth every evening.   traZODone 50 MG tablet Commonly known as: DESYREL Take 50 mg by mouth at bedtime.   trimethoprim 100 MG tablet Commonly known as: TRIMPEX Take 1 tablet (100 mg total) by mouth daily. Start taking on: March 26, 2019 What changed: These instructions start on March 26, 2019. If you are unsure what to do until then, ask your doctor or other care provider.        Relevant Imaging Results:  Relevant Lab Results:   Additional Information SS# [id number]  Boneta Lucks, RN

## 2019-03-22 NOTE — TOC Transition Note (Signed)
Transition of Care [facility]) - CM/SW Discharge Note   Patient Details  Name: Deyci Gesell MRN: [id number] Date of Birth: 10-Jul-1927  [facility] CM/SW Contact:  Boneta Lucks, RN Phone Number: 03/22/2019, 1:00 PM   Clinical Narrative:   Discharge planning, patient discharging back to [facility].  FL2 done and clinicals sent to facility.  CM called [facility], they will pick patient up, they ask for RN to call Janett Billow when patient is ready. RN updated.   PT recommended HHPT,  Faxed orders to [facility] as requested by [facility].    Final next level of care: Assisted Living Barriers to Discharge: Barriers Resolved   Patient Goals and CMS Choice Patient states their goals for this hospitalization and ongoing recovery are:: to go back to [place].      Discharge Placement              Patient chooses bed at: Other - please specify in the comment section below:([facility]) Patient to be transferred to facility by: [facility] staff   Patient and family notified of of transfer: 03/22/19

## 2019-03-22 NOTE — Discharge Summary (Signed)
Physician Discharge Summary  Regina Bush CBU:[id number] DOB: 02-Jun-1927 DOA: 03/18/2019  PCP: Regina Albee, MD  Admit date: 03/18/2019 Discharge date: 03/22/2019  Admitted From:  ALF Disposition:  ALF  Recommendations for Outpatient Follow-up:  1. Follow up with PCP in 1-2 weeks 2. Please obtain BMP/CBC in 1-2 weeks 3. Please monitor blood pressure and adjust meds as needed.   Discharge Condition: STABLE   CODE STATUS: DNR    Brief Hospitalization Summary: Please see all hospital notes, images, labs for full details of the hospitalization. ADMISSION HPI:  Regina Bush  is a 86 y.o. female, with hx of thyroid disease, subdural hematoma, pneumonitis, hypertension, diabetes mellitus type 2, dementia, and more presents to the ED with a chief complaint of shortness of breath.  Patient is a poor historian secondary to dementia.  She does provide what part of the history she can remember, but when asked if she has had any breathing treatment or inhaler she says no and then on review of medical record it is evident that she had a breathing treatment in the ER today so it is unclear if history is reliable.  I personally called and spoke with the nurse at [facility] who sent her to the ED.  She reports that patient was complaining of dyspnea and chest pressure.  Patient apparently had told her she could not breathe and felt like someone was sitting on her chest.  At that time her blood pressure is 140/80, temp was 97.3, O2 sats were 96% on room air, but the patient insisted that she could not breathe and so she was sent to the ER.  Nurse does report that patient complained of dyspnea last night as well but said that she felt all right when she was checked later.  Nurse had given her instructions to let them know if she felt worse and she did not complain about it again until today.  Patient reports that she felt short of breath with no chest pressure or chest pain.  She reports her shortness of breath  was gradual in onset and progressively worsening since yesterday.  Patient reports no cough, and no subjective fever.  Patient does endorse nausea, and palpitations since yesterday.  Patient reports that the palpitations only last for a few seconds to a minute.  Patient reports that her shortness of breath is nonexertional.  She reports that she has never had anything like this before.  She reports that she does not wear oxygen at baseline, and she feels a little better with the nasal cannula on, but still feels short of breath.  Patient denies any change in urinary habits.  Patient denies hematuria.  Nurses unable to give more insight on this as patient attends to her own bathroom needs with pull ups.  ED course Pulse 60, respiratory rate 22-26, blood pressure 173/56, O2 sats 95 to 100% on 2 L nasal cannula Potassium 6.3 BUN 41 Creatinine 2.27 BUN to creatinine ratio 18 GFR 19 BNP 225 Covid, flu a, flu B, negative Albuterol breathing treatment administered 5 units of insulin 1 amp of D50 LR 1000 mL bolus Sodium bicarb 1 amp Lokelma Chest x-ray -diffuse bilateral interstitial and alveolar opacities which could reflect infection or edema EKG shows sinus rhythm with a heart rate of 63 and a QTC of 456, no hyperkalemic changes  Brief History   86 year old woman resident of [facility] including subdural hematoma, diabetes mellitus type 2, dementia presented with shortness of breath.  Admitted for acute hypoxic  respiratory failure, pneumonia Covid negative, acute kidney injury  A & P  Acute hypoxic respiratory failure secondary to bilateral pneumonia, SARS-CoV-2 negative.  Influenza negative. Negative V/Q scan.  Repeat xrays show improvement.  Pt is clinically much improved.   Acute kidney injury with hyperkalemia  --improved.     Hyperkalemia - RESOLVED.    Diabetes mellitus type 2 --Mild fasting hypoglycemia.  Decreased Lantus to 4 units nightly. CBG (last 3)  Recent Labs     03/21/19 1609 03/21/19 2048 03/22/19 0744  GLUCAP 87 164* 101*   Chronic diastolic CHF --Euvolemic on exam  Aortic atherosclerosis  DVT prophylaxis: heparin Code Status: Full Disposition Plan: Return to [facility] 1/7   Discharge Diagnoses:  Principal Problem:   Acute respiratory failure with hypoxia ([place]) Active Problems:   Diabetes mellitus without complication (HCC)   AKI (acute kidney injury) ([place])   Pneumonia   Goals of care, counseling/discussion   Palliative care by specialist   Dyspnea   Hyperkalemia   Hypoxia   Shortness of breath   DNR (do not resuscitate)   Discharge Instructions:  Allergies as of 03/22/2019      Reactions   Cymbalta [duloxetine Hcl] Other (See Comments)   unknown   Effexor [venlafaxine] Other (See Comments)   unknown   Morphine And Related Other (See Comments)   Passed out    Tape    adhesive      Medication List    TAKE these medications   acetaminophen 325 MG tablet Commonly known as: TYLENOL Take 2 tablets (650 mg total) by mouth every 6 (six) hours as needed for mild pain or moderate pain.   amLODipine 10 MG tablet Commonly known as: NORVASC Take 10 mg by mouth daily.   aspirin EC 81 MG tablet Take 81 mg by mouth daily.   atorvastatin 20 MG tablet Commonly known as: LIPITOR Take 20 mg by mouth daily.   BENEFIBER PO Take 10 mLs by mouth daily.   cetirizine 10 MG tablet Commonly known as: ZYRTEC Take 10 mg by mouth at bedtime as needed for allergies.   cholecalciferol 25 MCG (1000 UT) tablet Commonly known as: VITAMIN D Take 1,000 Units by mouth daily.   doxycycline 100 MG capsule Commonly known as: VIBRAMYCIN Take 1 capsule (100 mg total) by mouth 2 (two) times daily for 2 days.   famotidine 20 MG tablet Commonly known as: PEPCID Take 20 mg by mouth daily.   fluticasone 50 MCG/ACT nasal spray Commonly known as: FLONASE Place 2 sprays into both nostrils daily.   hydrALAZINE 25 MG tablet Commonly  known as: APRESOLINE Take 25 mg by mouth 3 (three) times daily. Give 1 tablet by mouth three times a day for HTN hold for systolic BP <481   insulin glargine 100 UNIT/ML injection Commonly known as: LANTUS Inject 0.04 mLs (4 Units total) into the skin at bedtime. What changed:   how much to take  when to take this   ketoconazole 2 % cream Commonly known as: NIZORAL Apply 1 application topically daily. Apply to abdominal folds under breast, groin area and sacrum area where redness and rash is located report to provider if no improvements is noted.   levothyroxine 50 MCG tablet Commonly known as: SYNTHROID Take 50 mcg by mouth daily before breakfast.   magnesium oxide 400 MG tablet Commonly known as: MAG-OX Take 400 mg by mouth daily.   multivitamin with minerals Tabs tablet Take 1 tablet by mouth daily.   tamsulosin 0.4  MG Caps capsule Commonly known as: FLOMAX Take 0.4 mg by mouth every evening.   traZODone 50 MG tablet Commonly known as: DESYREL Take 50 mg by mouth at bedtime.   trimethoprim 100 MG tablet Commonly known as: TRIMPEX Take 1 tablet (100 mg total) by mouth daily. Start taking on: March 26, 2019 What changed: These instructions start on March 26, 2019. If you are unsure what to do until then, ask your doctor or other care provider.      Follow-up Information    Regina Albee, MD. Schedule an appointment as soon as possible for a visit in 2 week(s).   Specialty: Internal Medicine Contact information: [place] [address] [phone]          Allergies  Allergen Reactions  . Cymbalta [Duloxetine Hcl] Other (See Comments)    unknown  . Effexor [Venlafaxine] Other (See Comments)    unknown  . Morphine And Related Other (See Comments)    Passed out   . Tape     adhesive   Allergies as of 03/22/2019      Reactions   Cymbalta [duloxetine Hcl] Other (See Comments)   unknown   Effexor [venlafaxine] Other (See Comments)    unknown   Morphine And Related Other (See Comments)   Passed out    Tape    adhesive      Medication List    TAKE these medications   acetaminophen 325 MG tablet Commonly known as: TYLENOL Take 2 tablets (650 mg total) by mouth every 6 (six) hours as needed for mild pain or moderate pain.   amLODipine 10 MG tablet Commonly known as: NORVASC Take 10 mg by mouth daily.   aspirin EC 81 MG tablet Take 81 mg by mouth daily.   atorvastatin 20 MG tablet Commonly known as: LIPITOR Take 20 mg by mouth daily.   BENEFIBER PO Take 10 mLs by mouth daily.   cetirizine 10 MG tablet Commonly known as: ZYRTEC Take 10 mg by mouth at bedtime as needed for allergies.   cholecalciferol 25 MCG (1000 UT) tablet Commonly known as: VITAMIN D Take 1,000 Units by mouth daily.   doxycycline 100 MG capsule Commonly known as: VIBRAMYCIN Take 1 capsule (100 mg total) by mouth 2 (two) times daily for 2 days.   famotidine 20 MG tablet Commonly known as: PEPCID Take 20 mg by mouth daily.   fluticasone 50 MCG/ACT nasal spray Commonly known as: FLONASE Place 2 sprays into both nostrils daily.   hydrALAZINE 25 MG tablet Commonly known as: APRESOLINE Take 25 mg by mouth 3 (three) times daily. Give 1 tablet by mouth three times a day for HTN hold for systolic BP <220   insulin glargine 100 UNIT/ML injection Commonly known as: LANTUS Inject 0.04 mLs (4 Units total) into the skin at bedtime. What changed:   how much to take  when to take this   ketoconazole 2 % cream Commonly known as: NIZORAL Apply 1 application topically daily. Apply to abdominal folds under breast, groin area and sacrum area where redness and rash is located report to provider if no improvements is noted.   levothyroxine 50 MCG tablet Commonly known as: SYNTHROID Take 50 mcg by mouth daily before breakfast.   magnesium oxide 400 MG tablet Commonly known as: MAG-OX Take 400 mg by mouth daily.   multivitamin with  minerals Tabs tablet Take 1 tablet by mouth daily.   tamsulosin 0.4 MG Caps capsule Commonly known  as: FLOMAX Take 0.4 mg by mouth every evening.   traZODone 50 MG tablet Commonly known as: DESYREL Take 50 mg by mouth at bedtime.   trimethoprim 100 MG tablet Commonly known as: TRIMPEX Take 1 tablet (100 mg total) by mouth daily. Start taking on: March 26, 2019 What changed: These instructions start on March 26, 2019. If you are unsure what to do until then, ask your doctor or other care provider.       Procedures/Studies: NM Pulmonary Perfusion  Result Date: 03/19/2019 CLINICAL DATA:  Shortness of breath, weakness for few weeks, history stroke, coronary artery disease, hypertension, type II diabetes mellitus EXAM: NUCLEAR MEDICINE PERFUSION LUNG SCAN TECHNIQUE: Perfusion images were obtained in multiple projections after intravenous injection of radiopharmaceutical. Ventilation scans intentionally deferred if perfusion scan and chest x-ray adequate for interpretation during COVID 19 epidemic. RADIOPHARMACEUTICALS:  1.45 mCi Tc-31m MAA IV COMPARISON:  None Correlation: Chest radiograph 03/19/2019 FINDINGS: Enlargement of cardiac silhouette. Diminished perfusion at the posterior mid LEFT lung suspect related to combination of enlargement of cardiac silhouette, LEFT pleural effusion and probable LEFT lower lobe atelectasis on chest radiograph No other segmental or subsegmental perfusion defects identified. Ventilation exam not performed. Findings represent a low probability for pulmonary embolism. IMPRESSION: Low probability for pulmonary embolism. Electronically Signed   By: Lavonia Dana M.D.   On: 03/19/2019 13:37   DG CHEST PORT 1 VIEW  Result Date: 03/21/2019 CLINICAL DATA:  Shortness of breath EXAM: PORTABLE CHEST 1 VIEW COMPARISON:  03/20/2019 FINDINGS: Stable cardiomegaly. Bilateral chronic interstitial lung disease. No focal consolidation, pleural effusion or pneumothorax. Lingular  airspace disease consistent with scarring. No aggressive osseous lesion. Multiple old left rib fractures. Moderate osteoarthritis of the left glenohumeral joint. IMPRESSION: No acute cardiopulmonary disease. Bilateral chronic interstitial lung disease. Electronically Signed   By: Kathreen Devoid   On: 03/21/2019 10:03   DG CHEST PORT 1 VIEW  Result Date: 03/20/2019 CLINICAL DATA:  Shortness of breath EXAM: PORTABLE CHEST 1 VIEW COMPARISON:  March 19, 2019 FINDINGS: Again noted is cardiomegaly. There are persistent interstitial airspace opacities bilaterally with scattered mixed nodular airspace opacities primarily in the right mid and right upper lung zones. There is a developing opacity at the left lung base which appears to be new from prior study. There is no pneumothorax. There may be small bilateral pleural effusions. There are old healed left-sided rib fractures. Aortic calcifications are noted. IMPRESSION: 1. Persistent prominent interstitial lung markings may represent pulmonary edema or an atypical infectious process. 2. Nodular bilateral airspace opacities, stable from recent prior study but new from remote priors. These are of unknown clinical significance. Follow-up to radiologic resolution is recommended. 3. More focal airspace opacity at the left lung base concerning for pneumonia or atelectasis. 4. Cardiomegaly.  Aortic Atherosclerosis (ICD10-I70.0). Electronically Signed   By: Constance Holster M.D.   On: 03/20/2019 20:03   DG CHEST PORT 1 VIEW  Result Date: 03/19/2019 CLINICAL DATA:  Dyspnea. EXAM: PORTABLE CHEST 1 VIEW COMPARISON:  Radiograph yesterday, radiograph 03/08/2018 FINDINGS: Stable cardiomegaly. Aortic atherosclerosis. Interstitial opacities slightly improved from yesterday, similar to 2019 exam. No pneumothorax or pleural effusion. Bones are under mineralized. IMPRESSION: 1. Stable cardiomegaly. 2. Interstitial opacities are slightly improved from yesterday, may reflect improving  pulmonary edema or atypical infection. Aortic Atherosclerosis (ICD10-I70.0). Electronically Signed   By: Keith Rake M.D.   On: 03/19/2019 02:27   DG Chest Port 1 View  Result Date: 03/18/2019 CLINICAL DATA:  SOB x 2 days. Hx  DM, HTN, CAD. Pt being tested for COVID by respiratory panel. EXAM: PORTABLE CHEST 1 VIEW COMPARISON:  Chest radiograph 02/06/2018 FINDINGS: Stable cardiomediastinal contours with enlarged heart size. Aortic arch calcification. There are diffuse bilateral interstitial and alveolar opacities which could reflect infection or edema. No pneumothorax or large pleural effusion. No acute finding in the visualized skeleton. IMPRESSION: Diffuse bilateral interstitial and alveolar opacities which could reflect infection or edema. Electronically Signed   By: Audie Pinto M.D.   On: 03/18/2019 15:39      Subjective: Pt without complaints.   Discharge Exam: Vitals:   03/21/19 2051 03/22/19 0500  BP: (!) 166/44 (!) 164/62  Pulse: 69 62  Resp: 20 20  Temp: 98.9 F (37.2 C) 98.1 F (36.7 C)  SpO2: 96% 99%   Vitals:   03/21/19 1339 03/21/19 2011 03/21/19 2051 03/22/19 0500  BP: (!) 149/51  (!) 166/44 (!) 164/62  Pulse: 64  69 62  Resp: 19  20 20   Temp: 99 F (37.2 C)  98.9 F (37.2 C) 98.1 F (36.7 C)  TempSrc: Oral  Oral Oral  SpO2:  94% 96% 99%  Weight:      Height:        General: Pt is alert, awake, not in acute distress Cardiovascular: RRR, S1/S2 +, no rubs, no gallops Respiratory: CTA bilaterally, no wheezing, no rhonchi Abdominal: Soft, NT, ND, bowel sounds + Extremities: no edema, no cyanosis   The results of significant diagnostics from this hospitalization (including imaging, microbiology, ancillary and laboratory) are listed below for reference.     Microbiology: Recent Results (from the past 240 hour(s))  Respiratory Panel by RT PCR (Flu A&B, Covid) - Nasopharyngeal Swab     Status: None   Collection Time: 03/18/19  3:16 PM   Specimen:  Nasopharyngeal Swab  Result Value Ref Range Status   SARS Coronavirus 2 by RT PCR NEGATIVE NEGATIVE Final    Comment: (NOTE) SARS-CoV-2 target nucleic acids are NOT DETECTED. The SARS-CoV-2 RNA is generally detectable in upper respiratoy specimens during the acute phase of infection. The lowest concentration of SARS-CoV-2 viral copies this assay can detect is 131 copies/mL. A negative result does not preclude SARS-Cov-2 infection and should not be used as the sole basis for treatment or other patient management decisions. A negative result may occur with  improper specimen collection/handling, submission of specimen other than nasopharyngeal swab, presence of viral mutation(s) within the areas targeted by this assay, and inadequate number of viral copies (<131 copies/mL). A negative result must be combined with clinical observations, patient history, and epidemiological information. The expected result is Negative. Fact Sheet for Patients:  PinkCheek.be Fact Sheet for Healthcare Providers:  GravelBags.it This test is not yet ap proved or cleared by the [place] FDA and  has been authorized for detection and/or diagnosis of SARS-CoV-2 by FDA under an Emergency Use Authorization (EUA). This EUA will remain  in effect (meaning this test can be used) for the duration of the COVID-19 declaration under Section 564(b)(1) of the Act, 21 U.S.C. section 360bbb-3(b)(1), unless the authorization is terminated or revoked sooner.    Influenza A by PCR NEGATIVE NEGATIVE Final   Influenza B by PCR NEGATIVE NEGATIVE Final    Comment: (NOTE) The Xpert Xpress SARS-CoV-2/FLU/RSV assay is intended as an aid in  the diagnosis of influenza from Nasopharyngeal swab specimens and  should not be used as a sole basis for treatment. Nasal washings and  aspirates are unacceptable for Xpert Xpress SARS-CoV-2/FLU/RSV  testing. Fact Sheet for  Patients: PinkCheek.be Fact Sheet for Healthcare Providers: GravelBags.it This test is not yet approved or cleared by the [place] FDA and  has been authorized for detection and/or diagnosis of SARS-CoV-2 by  FDA under an Emergency Use Authorization (EUA). This EUA will remain  in effect (meaning this test can be used) for the duration of the  Covid-19 declaration under Section 564(b)(1) of the Act, 21  U.S.C. section 360bbb-3(b)(1), unless the authorization is  terminated or revoked. Performed at [facility], [address] [place], [place] [address]      Labs: BNP (last 3 results) Recent Labs    03/18/19 1530  BNP 272.5*   Basic Metabolic Panel: Recent Labs  Lab 03/18/19 1530 03/19/19 0346 03/19/19 1950 03/20/19 0541 03/20/19 1432 03/21/19 0943 03/22/19 0610  NA  --   --  138 141 139 141 142  K   < >  --  6.6* 5.5* 5.5* 5.0 4.8  CL  --   --  111 113* 112* 110 113*  CO2  --   --  19* 23 21* 23 24  GLUCOSE  --   --  101* 77 129* 109* 112*  BUN  --   --  43* 41* 39* 34* 31*  CREATININE  --   --  2.03* 2.02* 1.95* 1.74* 1.73*  CALCIUM  --   --  8.6* 8.9 8.6* 8.6* 8.5*  MG  --  2.5*  --   --   --   --   --    < > = values in this interval not displayed.   Liver Function Tests: Recent Labs  Lab 03/18/19 1530  AST 15  ALT 15  ALKPHOS 63  BILITOT 0.4  PROT 6.6  ALBUMIN 3.5   No results for input(s): LIPASE, AMYLASE in the last 168 hours. No results for input(s): AMMONIA in the last 168 hours. CBC: Recent Labs  Lab 03/18/19 1530 03/19/19 0346 03/20/19 0541 03/21/19 0943 03/22/19 0610  WBC 10.3 10.3 13.6* 9.4 7.7  NEUTROABS 8.0*  --   --   --   --   HGB 11.2* 11.1* 10.8* 10.8* 10.8*  HCT 36.7 36.5 36.1 36.2 36.1  MCV 100.0 98.6 101.7* 102.0* 102.0*  PLT 216 217 226 193 181   Cardiac Enzymes: No results for input(s): CKTOTAL, CKMB, CKMBINDEX, TROPONINI in the last 168 hours. BNP: Invalid  input(s): POCBNP CBG: Recent Labs  Lab 03/21/19 0847 03/21/19 1105 03/21/19 1609 03/21/19 2048 03/22/19 0744  GLUCAP 102* 164* 87 164* 101*   D-Dimer No results for input(s): DDIMER in the last 72 hours. Hgb A1c No results for input(s): HGBA1C in the last 72 hours. Lipid Profile No results for input(s): CHOL, HDL, LDLCALC, TRIG, CHOLHDL, LDLDIRECT in the last 72 hours. Thyroid function studies No results for input(s): TSH, T4TOTAL, T3FREE, THYROIDAB in the last 72 hours.  Invalid input(s): FREET3 Anemia work up No results for input(s): VITAMINB12, FOLATE, FERRITIN, TIBC, IRON, RETICCTPCT in the last 72 hours. Urinalysis    Component Value Date/Time   COLORURINE YELLOW 03/18/2019 2256   APPEARANCEUR HAZY (A) 03/18/2019 2256   LABSPEC 1.013 03/18/2019 2256   PHURINE 6.0 03/18/2019 2256   GLUCOSEU NEGATIVE 03/18/2019 2256   HGBUR NEGATIVE 03/18/2019 2256   BILIRUBINUR NEGATIVE 03/18/2019 2256   KETONESUR NEGATIVE 03/18/2019 2256   PROTEINUR >=300 (A) 03/18/2019 2256   NITRITE NEGATIVE 03/18/2019 2256   LEUKOCYTESUR TRACE (A) 03/18/2019 2256   Sepsis Labs Invalid input(s):  PROCALCITONIN,  WBC,  LACTICIDVEN Microbiology Recent Results (from the past 240 hour(s))  Respiratory Panel by RT PCR (Flu A&B, Covid) - Nasopharyngeal Swab     Status: None   Collection Time: 03/18/19  3:16 PM   Specimen: Nasopharyngeal Swab  Result Value Ref Range Status   SARS Coronavirus 2 by RT PCR NEGATIVE NEGATIVE Final    Comment: (NOTE) SARS-CoV-2 target nucleic acids are NOT DETECTED. The SARS-CoV-2 RNA is generally detectable in upper respiratoy specimens during the acute phase of infection. The lowest concentration of SARS-CoV-2 viral copies this assay can detect is 131 copies/mL. A negative result does not preclude SARS-Cov-2 infection and should not be used as the sole basis for treatment or other patient management decisions. A negative result may occur with  improper specimen  collection/handling, submission of specimen other than nasopharyngeal swab, presence of viral mutation(s) within the areas targeted by this assay, and inadequate number of viral copies (<131 copies/mL). A negative result must be combined with clinical observations, patient history, and epidemiological information. The expected result is Negative. Fact Sheet for Patients:  PinkCheek.be Fact Sheet for Healthcare Providers:  GravelBags.it This test is not yet ap proved or cleared by the [place] FDA and  has been authorized for detection and/or diagnosis of SARS-CoV-2 by FDA under an Emergency Use Authorization (EUA). This EUA will remain  in effect (meaning this test can be used) for the duration of the COVID-19 declaration under Section 564(b)(1) of the Act, 21 U.S.C. section 360bbb-3(b)(1), unless the authorization is terminated or revoked sooner.    Influenza A by PCR NEGATIVE NEGATIVE Final   Influenza B by PCR NEGATIVE NEGATIVE Final    Comment: (NOTE) The Xpert Xpress SARS-CoV-2/FLU/RSV assay is intended as an aid in  the diagnosis of influenza from Nasopharyngeal swab specimens and  should not be used as a sole basis for treatment. Nasal washings and  aspirates are unacceptable for Xpert Xpress SARS-CoV-2/FLU/RSV  testing. Fact Sheet for Patients: PinkCheek.be Fact Sheet for Healthcare Providers: GravelBags.it This test is not yet approved or cleared by the [place] FDA and  has been authorized for detection and/or diagnosis of SARS-CoV-2 by  FDA under an Emergency Use Authorization (EUA). This EUA will remain  in effect (meaning this test can be used) for the duration of the  Covid-19 declaration under Section 564(b)(1) of the Act, 21  U.S.C. section 360bbb-3(b)(1), unless the authorization is  terminated or revoked. Performed at [facility],  [address] [place], [place] [address]    Time coordinating discharge: 32 minutes   SIGNED:  Irwin Brakeman, MD  [facility] 03/22/2019, 9:12 AM How to contact the [facility] Attending or Consulting provider [place] or covering provider during after hours [place], for this patient?  1. Check the care team in [facility] and look for a) attending/consulting [facility] provider listed and b) the [facility] team listed 2. Log into [email or web address] and use [place]'s universal password to access. If you do not have the password, please contact the hospital operator. 3. Locate the [facility] provider you are looking for under [facility] and page to a number that you can be directly reached. 4. If you still have difficulty reaching the provider, please page the [facility] (Director on Call) for the Hospitalists listed on amion for assistance.

## 2019-03-22 NOTE — Evaluation (Signed)
Physical Therapy Evaluation Patient Details Name: Regina Bush MRN: [id number] DOB: 1927-11-29 Today's Date: 03/22/2019   History of Present Illness  Regina Bush  is a 86 y.o. female, with hx of thyroid disease, subdural hematoma, pneumonitis, hypertension, diabetes mellitus type 2, dementia, and more presents to the ED with a chief complaint of shortness of breath.  Patient is a poor historian secondary to dementia.  She does provide what part of the history she can remember, but when asked if she has had any breathing treatment or inhaler she says no and then on review of medical record it is evident that she had a breathing treatment in the ER today so it is unclear if history is reliable.  I personally called and spoke with the nurse at [facility] who sent her to the ED.  She reports that patient was complaining of dyspnea and chest pressure.  Patient apparently had told her she could not breathe and felt like someone was sitting on her chest.  At that time her blood pressure is 140/80, temp was 97.3, O2 sats were 96% on room air, but the patient insisted that she could not breathe and so she was sent to the ER.  Nurse does report that patient complained of dyspnea last night as well but said that she felt all right when she was checked later.  Nurse had given her instructions to let them know if she felt worse and she did not complain about it again until today.    Clinical Impression  Patient functioning near baseline for functional mobility and gait, presents on 2 LPM O2 , put on room air with with SpO2 at 88-90%, demonstrates slow labored movement for sitting up at bedside and limited to a few steps with RW during transfer to chair.  Patient tolerated staying up in chair after therapy with SpO2 at 93-95% after therapy - RN notified.  Plan: patient to be discharged home today and discharged from physical therapy to care of nursing for out of bed daily as tolerated.      Follow Up Recommendations  Home health PT;Supervision for mobility/OOB;Supervision - Intermittent    Equipment Recommendations  None recommended by PT    Recommendations for Other Services       Precautions / Restrictions Precautions Precautions: Fall Restrictions Weight Bearing Restrictions: No      Mobility  Bed Mobility Overal bed mobility: Needs Assistance Bed Mobility: Supine to Sit     Supine to sit: Min assist     General bed mobility comments: increased time, labored movement  Transfers Overall transfer level: Needs assistance Equipment used: Rolling walker (2 wheeled) Transfers: Sit to/from [facility] Sit to Stand: Min assist Stand pivot transfers: Min assist       General transfer comment: inreased time, labored movement  Ambulation/Gait Ambulation/Gait assistance: Mod assist Gait Distance (Feet): 5 Feet Assistive device: Rolling walker (2 wheeled) Gait Pattern/deviations: Decreased step length - right;Decreased step length - left;Decreased stride length Gait velocity: decreased   General Gait Details: limited to 5-6 slow unsteady labored steps at bedside  Stairs            Wheelchair Mobility    Modified Rankin (Stroke Patients Only)       Balance Overall balance assessment: Needs assistance Sitting-balance support: Feet supported;No upper extremity supported Sitting balance-Leahy Scale: Fair Sitting balance - Comments: fair/good seated at bedside   Standing balance support: During functional activity;Bilateral upper extremity supported Standing balance-Leahy Scale: Fair Standing balance comment: using  RW                             Pertinent Vitals/Pain Pain Assessment: No/denies pain    Home Living Family/patient expects to be discharged to:: Assisted living               Home Equipment: Wheelchair - manual      Prior Function Level of Independence: Needs assistance   Gait / Transfers Assistance Needed: mostly  wheelchair bound per patient, assisted transfers  ADL's / Homemaking Assistance Needed: assisted by ALF staff        Hand Dominance        Extremity/Trunk Assessment   Upper Extremity Assessment Upper Extremity Assessment: Generalized weakness    Lower Extremity Assessment Lower Extremity Assessment: Generalized weakness    Cervical / Trunk Assessment Cervical / Trunk Assessment: Kyphotic  Communication   Communication: No difficulties  Cognition Arousal/Alertness: Awake/alert Behavior During Therapy: WFL for tasks assessed/performed Overall Cognitive Status: Within Functional Limits for tasks assessed                                        General Comments      Exercises     Assessment/Plan    PT Assessment All further PT needs can be met in the next venue of care  PT Problem List Decreased strength;Decreased activity tolerance;Decreased balance;Decreased mobility       PT Treatment Interventions      PT Goals (Current goals can be found in the Care Plan section)  Acute Rehab PT Goals Patient Stated Goal: return to ALF PT Goal Formulation: With patient Time For Goal Achievement: 03/22/19 Potential to Achieve Goals: Good    Frequency     Barriers to discharge        Co-evaluation               AM-PAC PT "6 Clicks" Mobility  Outcome Measure Help needed turning from your back to your side while in a flat bed without using bedrails?: A Little Help needed moving from lying on your back to sitting on the side of a flat bed without using bedrails?: A Little Help needed moving to and from a bed to a chair (including a wheelchair)?: A Little Help needed standing up from a chair using your arms (e.g., wheelchair or bedside chair)?: A Little Help needed to walk in hospital room?: A Lot Help needed climbing 3-5 steps with a railing? : Total 6 Click Score: 15    End of Session   Activity Tolerance: Patient tolerated treatment  well;Patient limited by fatigue Patient left: in chair;with call bell/phone within reach Nurse Communication: Mobility status PT Visit Diagnosis: Unsteadiness on feet (R26.81);Other abnormalities of gait and mobility (R26.89);Muscle weakness (generalized) (M62.81)    Time: 3500-9381 PT Time Calculation (min) (ACUTE ONLY): 26 min   Charges:   PT Evaluation $PT Eval Moderate Complexity: 1 Mod PT Treatments $Therapeutic Activity: 23-37 mins        2:00 PM, 03/22/19 Lonell Grandchild, MPT Physical Therapist with [facility] [phone] office [phone] mobile phone

## 2019-03-22 NOTE — Discharge Instructions (Signed)
Chronic Respiratory Failure  Respiratory failure is a condition in which the lungs do not work well and the breathing (respiratory) system fails. When respiratory failure occurs, it becomes difficult for the lungs to get enough oxygen or to eliminate carbon dioxide or to do both duties. If the lungs do not work properly, the heart, brain, and other body systems do not get enough oxygen. Respiratory failure is life-threatening if it is not treated. Respiratory failure can be acute or chronic. Acute respiratory failure is sudden and severe and requires emergency medical treatment. Chronic respiratory failure happens over time, usually due to a medical condition that gets worse. What are the causes? This condition may be caused by any problem that affects the heart or lungs. Causes include:  Chronic bronchitis and emphysema (COPD).  Pulmonary fibrosis.  Water in the lungs due to heart failure, lung injury, or infection (pulmonary edema).  Asthma.  Nerve or muscle diseases that make chest movements difficult, such as Leta Baptist disease or Guillain-Barre syndrome.  A collapsed lung (pneumothorax).  Pulmonary hypertension.  Chronic sleep apnea.  Pneumonia.  Obesity.  A blood clot in a lung (pulmonary embolism).  Trauma to the chest that makes breathing difficult. What increases the risk? You are more likely to develop this condition if:  You are a smoker, or have a history of smoking.  You have a weak immune system.  You have a family history of breathing problems or lung disease.  You have a long term lung disease such as COPD. What are the signs or symptoms? Symptoms of this condition include:  Shortness of breath with or without activity.  Difficulty breathing.  Wheezing.  A fast or irregular heartbeat (arrhythmia).  Chest pain or tightness.  A bluish color to the fingernail or toenail beds (cyanosis).  Confusion.  Drowsiness.  Extreme fatigue, especially with  minimal activity. How is this diagnosed? This condition may be diagnosed based on:  Your medical history.  A physical exam.  Other tests, such as: ? A chest X-ray. ? A CT scan of your lungs. ? Blood tests, such as an arterial blood gas test. This test is done to check if you have enough oxygen in your blood. ? An electrocardiogram. This test records the electrical activity of your heart. ? An echocardiogram. This test uses sound waves to produce an image of your heart.  A check of your blood pressure, heart rate, breathing rate, and blood oxygen level. How is this treated? Treatment for this condition depends on the cause. Treatment can include the following:  Getting oxygen through a nasal cannula. This is a tube that goes in your nose.  Getting oxygen through a face mask.  Receiving noninvasive positive pressure ventilation. This is a method of breathing support in which a machine blows air into your lungs through a mask. The machine allows you to breathe on your own. It helps the body take in oxygen and eliminate carbon dioxide.  Using a ventilator. This is a breathing machine that delivers oxygen to the lungs through a breathing tube that is put into the trachea. This machine is used when you can no longer breathe well enough on your own.  Medicines to help with breathing, such as: ? Medicines that open up and relax air passages, such as bronchodilators. These may be given through a device that turns liquid medicines into a mist you can breathe in (nebulizer). These medicines help with breathing. ? Diuretics. These medicines get rid of extra fluid out  of your lungs, which can help you breathe better. ? Steroid medicines. These decrease inflammation in the lungs. ? Antibiotic medicines. These may be given to treat a bacterial infection, such as pneumonia.  Pulmonary rehabilitation. This is an exercise program that strengthens the muscles in your chest and helps you learn breathing  techniques in order to manage your condition. Follow these instructions at home: Medicines  Take over-the-counter and prescription medicines only as told by your health care provider.  If you were prescribed an antibiotic medicine, take it as told by your health care provider. Do not stop taking the antibiotic even if you start to feel better. General instructions  Use oxygen therapy and pulmonary rehabilitation if directed to by your health care provider. If you require home oxygen therapy, ask your health care provider whether you should purchase a pulse oximeter to measure your oxygen level at home.  Work with your health care provider to create a plan to help you deal with your condition. Follow this plan.  Do not use any products that contain nicotine or tobacco, such as cigarettes and e-cigarettes. If you need help quitting, ask your health care provider.  Avoid exposure to irritants that make your breathing problems worse. These include smoke, chemicals, and fumes.  Stay active, but balance activity with periods of rest. Exercise and physical activity will help you maintain your ability to do things you want to do.  Stay up to date on all vaccines, especially yearly influenza and pneumonia vaccines.  Avoid people who are sick as well as crowded places during the flu season.  Keep all follow-up visits as told by your health care provider. This is important. Contact a health care provider if:  Your shortness of breath gets worse and you cannot do the things you used to do.  You have increased mucus (sputum), wheezing, coughing, or loss of energy.  You are on oxygen therapy and you are starting to need more.  You need to use your medicines more often.  You have a fever. Get help right away if:  Your shortness of breath becomes worse.  You are unable to say more than a few words without having to catch your breath.  You develop chest pain or  tightness. Summary  Respiratory failure is a condition in which the lungs do not work well and the breathing system fails.  This condition can be very serious and is often life-threatening.  This condition is diagnosed with tests and can be treated with medicines or oxygen.  Contact a health care provider if your shortness of breath gets worse or if you need to use your oxygen or medicines more often than before. This information is not intended to replace advice given to you by your health care provider. Make sure you discuss any questions you have with your health care provider. Document Revised: 02/11/2017 Document Reviewed: 03/12/2016 Elsevier Patient Education  [place] of Breath, Adult Shortness of breath is when a person has trouble breathing enough air or when a person feels like she or he is having trouble breathing in enough air. Shortness of breath could be a sign of a medical problem. Follow these instructions at home:   Pay attention to any changes in your symptoms.  Do not use any products that contain nicotine or tobacco, such as cigarettes, e-cigarettes, and chewing tobacco.  Do not smoke. Smoking is a common cause of shortness of breath. If you need help quitting, ask your  health care provider.  Avoid things that can irritate your airways, such as: ? Mold. ? Dust. ? Air pollution. ? Chemical fumes. ? Things that can cause allergy symptoms (allergens), if you have allergies.  Keep your living space clean and free of mold and dust.  Rest as needed. Slowly return to your usual activities.  Take over-the-counter and prescription medicines only as told by your health care provider. This includes oxygen therapy and inhaled medicines.  Keep all follow-up visits as told by your health care provider. This is important. Contact a health care provider if:  Your condition does not improve as soon as expected.  You have a hard time doing your  normal activities, even after you rest.  You have new symptoms. Get help right away if:  Your shortness of breath gets worse.  You have shortness of breath when you are resting.  You feel light-headed or you faint.  You have a cough that is not controlled with medicines.  You cough up blood.  You have pain with breathing.  You have pain in your chest, arms, shoulders, or abdomen.  You have a fever.  You cannot walk up stairs or exercise the way that you normally do. These symptoms may represent a serious problem that is an emergency. Do not wait to see if the symptoms will go away. Get medical help right away. Call your local emergency services (911 in the [place] Do not drive yourself to the hospital. Summary  Shortness of breath is when a person has trouble breathing enough air. It can be a sign of a medical problem.  Avoid things that irritate your lungs, such as smoking, pollution, mold, and dust.  Pay attention to changes in your symptoms and contact your health care provider if you have a hard time completing daily activities because of shortness of breath. This information is not intended to replace advice given to you by your health care provider. Make sure you discuss any questions you have with your health care provider. Document Revised: 08/01/2017 Document Reviewed: 08/01/2017 Elsevier Patient Education  [place].   IMPORTANT INFORMATION: PAY CLOSE ATTENTION   PHYSICIAN DISCHARGE INSTRUCTIONS  Follow with Primary care provider  Doree Albee, MD  and other consultants as instructed by your Hospitalist Physician  [place] IF SYMPTOMS COME BACK, WORSEN OR NEW PROBLEM DEVELOPS   Please note: You were cared for by a hospitalist during your hospital stay. Every effort will be made to forward records to your primary care provider.  You can request that your primary care provider send for your hospital records if they  have not received them.  Once you are discharged, your primary care physician will handle any further medical issues. Please note that NO REFILLS for any discharge medications will be authorized once you are discharged, as it is imperative that you return to your primary care physician (or establish a relationship with a primary care physician if you do not have one) for your post hospital discharge needs so that they can reassess your need for medications and monitor your lab values.  Please get a complete blood count and chemistry panel checked by your Primary MD at your next visit, and again as instructed by your Primary MD.  Get Medicines reviewed and adjusted: Please take all your medications with you for your next visit with your Primary MD  Laboratory/radiological data: Please request your Primary MD to go over all hospital tests  and procedure/radiological results at the follow up, please ask your primary care provider to get all Hospital records sent to his/her office.  In some cases, they will be blood work, cultures and biopsy results pending at the time of your discharge. Please request that your primary care provider follow up on these results.  If you are diabetic, please bring your blood sugar readings with you to your follow up appointment with primary care.    Please call and make your follow up appointments as soon as possible.    Also Note the following: If you experience worsening of your admission symptoms, develop shortness of breath, life threatening emergency, suicidal or homicidal thoughts you must seek medical attention immediately by calling 911 or calling your MD immediately  if symptoms less severe.  You must read complete instructions/literature along with all the possible adverse reactions/side effects for all the Medicines you take and that have been prescribed to you. Take any new Medicines after you have completely understood and accpet all the possible adverse  reactions/side effects.   Do not drive when taking Pain medications or sleeping medications (Benzodiazepines)  Do not take more than prescribed Pain, Sleep and Anxiety Medications. It is not advisable to combine anxiety,sleep and pain medications without talking with your primary care practitioner  Special Instructions: If you have smoked or chewed Tobacco  in the last 2 yrs please stop smoking, stop any regular Alcohol  and or any Recreational drug use.  Wear Seat belts while driving.  Do not drive if taking any narcotic, mind altering or controlled substances or recreational drugs or alcohol.

## 2019-03-23 DIAGNOSIS — Z8673 Personal history of transient ischemic attack (TIA), and cerebral infarction without residual deficits: Secondary | ICD-10-CM | POA: Diagnosis not present

## 2019-03-23 DIAGNOSIS — J189 Pneumonia, unspecified organism: Secondary | ICD-10-CM | POA: Diagnosis not present

## 2019-03-23 DIAGNOSIS — Z66 Do not resuscitate: Secondary | ICD-10-CM | POA: Diagnosis not present

## 2019-03-23 DIAGNOSIS — Z7982 Long term (current) use of aspirin: Secondary | ICD-10-CM | POA: Diagnosis not present

## 2019-03-23 DIAGNOSIS — E119 Type 2 diabetes mellitus without complications: Secondary | ICD-10-CM | POA: Diagnosis not present

## 2019-03-23 DIAGNOSIS — I110 Hypertensive heart disease with heart failure: Secondary | ICD-10-CM | POA: Diagnosis not present

## 2019-03-23 DIAGNOSIS — I2510 Atherosclerotic heart disease of native coronary artery without angina pectoris: Secondary | ICD-10-CM | POA: Diagnosis not present

## 2019-03-23 DIAGNOSIS — Z794 Long term (current) use of insulin: Secondary | ICD-10-CM | POA: Diagnosis not present

## 2019-03-23 DIAGNOSIS — I5032 Chronic diastolic (congestive) heart failure: Secondary | ICD-10-CM | POA: Diagnosis not present

## 2019-03-23 DIAGNOSIS — F0390 Unspecified dementia without behavioral disturbance: Secondary | ICD-10-CM | POA: Diagnosis not present

## 2019-03-23 DIAGNOSIS — I700 Atherosclerosis of aorta: Secondary | ICD-10-CM | POA: Diagnosis not present

## 2019-03-23 DIAGNOSIS — N179 Acute kidney failure, unspecified: Secondary | ICD-10-CM | POA: Diagnosis not present

## 2019-03-23 DIAGNOSIS — J9601 Acute respiratory failure with hypoxia: Secondary | ICD-10-CM | POA: Diagnosis not present

## 2019-03-23 DIAGNOSIS — Z9181 History of falling: Secondary | ICD-10-CM | POA: Diagnosis not present

## 2019-03-23 DIAGNOSIS — Z993 Dependence on wheelchair: Secondary | ICD-10-CM | POA: Diagnosis not present

## 2019-03-23 DIAGNOSIS — Z792 Long term (current) use of antibiotics: Secondary | ICD-10-CM | POA: Diagnosis not present

## 2019-03-26 ENCOUNTER — Telehealth (INDEPENDENT_AMBULATORY_CARE_PROVIDER_SITE_OTHER): Payer: Self-pay

## 2019-03-26 DIAGNOSIS — Z20828 Contact with and (suspected) exposure to other viral communicable diseases: Secondary | ICD-10-CM | POA: Diagnosis not present

## 2019-03-26 NOTE — Telephone Encounter (Signed)
[facility] PT for 1x 1wk;2x 4wks;1x 2wks For  LE strengthing, transfering, balance and gait inprovement. Set a home exercise plan for home.

## 2019-03-26 NOTE — Telephone Encounter (Signed)
Yes, please go ahead and give these verbal orders.  Thanks.

## 2019-03-27 DIAGNOSIS — I5032 Chronic diastolic (congestive) heart failure: Secondary | ICD-10-CM | POA: Diagnosis not present

## 2019-03-27 DIAGNOSIS — N179 Acute kidney failure, unspecified: Secondary | ICD-10-CM | POA: Diagnosis not present

## 2019-03-27 DIAGNOSIS — J9601 Acute respiratory failure with hypoxia: Secondary | ICD-10-CM | POA: Diagnosis not present

## 2019-03-27 DIAGNOSIS — J189 Pneumonia, unspecified organism: Secondary | ICD-10-CM | POA: Diagnosis not present

## 2019-03-27 DIAGNOSIS — I2510 Atherosclerotic heart disease of native coronary artery without angina pectoris: Secondary | ICD-10-CM | POA: Diagnosis not present

## 2019-03-27 DIAGNOSIS — I110 Hypertensive heart disease with heart failure: Secondary | ICD-10-CM | POA: Diagnosis not present

## 2019-03-30 DIAGNOSIS — J189 Pneumonia, unspecified organism: Secondary | ICD-10-CM | POA: Diagnosis not present

## 2019-03-30 DIAGNOSIS — I110 Hypertensive heart disease with heart failure: Secondary | ICD-10-CM | POA: Diagnosis not present

## 2019-03-30 DIAGNOSIS — J9601 Acute respiratory failure with hypoxia: Secondary | ICD-10-CM | POA: Diagnosis not present

## 2019-03-30 DIAGNOSIS — N179 Acute kidney failure, unspecified: Secondary | ICD-10-CM | POA: Diagnosis not present

## 2019-03-30 DIAGNOSIS — I5032 Chronic diastolic (congestive) heart failure: Secondary | ICD-10-CM | POA: Diagnosis not present

## 2019-03-30 DIAGNOSIS — I2510 Atherosclerotic heart disease of native coronary artery without angina pectoris: Secondary | ICD-10-CM | POA: Diagnosis not present

## 2019-04-02 DIAGNOSIS — I5032 Chronic diastolic (congestive) heart failure: Secondary | ICD-10-CM | POA: Diagnosis not present

## 2019-04-02 DIAGNOSIS — J9601 Acute respiratory failure with hypoxia: Secondary | ICD-10-CM | POA: Diagnosis not present

## 2019-04-02 DIAGNOSIS — N179 Acute kidney failure, unspecified: Secondary | ICD-10-CM | POA: Diagnosis not present

## 2019-04-02 DIAGNOSIS — I2510 Atherosclerotic heart disease of native coronary artery without angina pectoris: Secondary | ICD-10-CM | POA: Diagnosis not present

## 2019-04-02 DIAGNOSIS — J189 Pneumonia, unspecified organism: Secondary | ICD-10-CM | POA: Diagnosis not present

## 2019-04-02 DIAGNOSIS — I110 Hypertensive heart disease with heart failure: Secondary | ICD-10-CM | POA: Diagnosis not present

## 2019-04-04 DIAGNOSIS — Z20828 Contact with and (suspected) exposure to other viral communicable diseases: Secondary | ICD-10-CM | POA: Diagnosis not present

## 2019-04-05 DIAGNOSIS — J189 Pneumonia, unspecified organism: Secondary | ICD-10-CM

## 2019-04-05 DIAGNOSIS — N179 Acute kidney failure, unspecified: Secondary | ICD-10-CM

## 2019-04-05 DIAGNOSIS — I2510 Atherosclerotic heart disease of native coronary artery without angina pectoris: Secondary | ICD-10-CM | POA: Diagnosis not present

## 2019-04-05 DIAGNOSIS — I110 Hypertensive heart disease with heart failure: Secondary | ICD-10-CM

## 2019-04-05 DIAGNOSIS — E119 Type 2 diabetes mellitus without complications: Secondary | ICD-10-CM

## 2019-04-05 DIAGNOSIS — F0390 Unspecified dementia without behavioral disturbance: Secondary | ICD-10-CM

## 2019-04-05 DIAGNOSIS — Z8673 Personal history of transient ischemic attack (TIA), and cerebral infarction without residual deficits: Secondary | ICD-10-CM

## 2019-04-05 DIAGNOSIS — J9601 Acute respiratory failure with hypoxia: Secondary | ICD-10-CM

## 2019-04-05 DIAGNOSIS — I700 Atherosclerosis of aorta: Secondary | ICD-10-CM

## 2019-04-05 DIAGNOSIS — I5032 Chronic diastolic (congestive) heart failure: Secondary | ICD-10-CM

## 2019-04-05 DIAGNOSIS — Z9181 History of falling: Secondary | ICD-10-CM

## 2019-04-09 DIAGNOSIS — Z9181 History of falling: Secondary | ICD-10-CM

## 2019-04-09 DIAGNOSIS — F0390 Unspecified dementia without behavioral disturbance: Secondary | ICD-10-CM | POA: Diagnosis not present

## 2019-04-09 DIAGNOSIS — I2510 Atherosclerotic heart disease of native coronary artery without angina pectoris: Secondary | ICD-10-CM | POA: Diagnosis not present

## 2019-04-09 DIAGNOSIS — J9601 Acute respiratory failure with hypoxia: Secondary | ICD-10-CM | POA: Diagnosis not present

## 2019-04-09 DIAGNOSIS — E119 Type 2 diabetes mellitus without complications: Secondary | ICD-10-CM

## 2019-04-09 DIAGNOSIS — I700 Atherosclerosis of aorta: Secondary | ICD-10-CM

## 2019-04-09 DIAGNOSIS — I110 Hypertensive heart disease with heart failure: Secondary | ICD-10-CM

## 2019-04-09 DIAGNOSIS — J189 Pneumonia, unspecified organism: Secondary | ICD-10-CM | POA: Diagnosis not present

## 2019-04-09 DIAGNOSIS — I5032 Chronic diastolic (congestive) heart failure: Secondary | ICD-10-CM

## 2019-04-09 DIAGNOSIS — N179 Acute kidney failure, unspecified: Secondary | ICD-10-CM | POA: Diagnosis not present

## 2019-04-11 DIAGNOSIS — Z03818 Encounter for observation for suspected exposure to other biological agents ruled out: Secondary | ICD-10-CM | POA: Diagnosis not present

## 2019-04-12 DIAGNOSIS — I110 Hypertensive heart disease with heart failure: Secondary | ICD-10-CM | POA: Diagnosis not present

## 2019-04-12 DIAGNOSIS — N179 Acute kidney failure, unspecified: Secondary | ICD-10-CM | POA: Diagnosis not present

## 2019-04-12 DIAGNOSIS — J189 Pneumonia, unspecified organism: Secondary | ICD-10-CM | POA: Diagnosis not present

## 2019-04-12 DIAGNOSIS — J9601 Acute respiratory failure with hypoxia: Secondary | ICD-10-CM | POA: Diagnosis not present

## 2019-04-12 DIAGNOSIS — I2510 Atherosclerotic heart disease of native coronary artery without angina pectoris: Secondary | ICD-10-CM | POA: Diagnosis not present

## 2019-04-12 DIAGNOSIS — I5032 Chronic diastolic (congestive) heart failure: Secondary | ICD-10-CM | POA: Diagnosis not present

## 2019-04-16 DIAGNOSIS — J9601 Acute respiratory failure with hypoxia: Secondary | ICD-10-CM | POA: Diagnosis not present

## 2019-04-16 DIAGNOSIS — J189 Pneumonia, unspecified organism: Secondary | ICD-10-CM | POA: Diagnosis not present

## 2019-04-16 DIAGNOSIS — I2510 Atherosclerotic heart disease of native coronary artery without angina pectoris: Secondary | ICD-10-CM | POA: Diagnosis not present

## 2019-04-16 DIAGNOSIS — N179 Acute kidney failure, unspecified: Secondary | ICD-10-CM | POA: Diagnosis not present

## 2019-04-16 DIAGNOSIS — I5032 Chronic diastolic (congestive) heart failure: Secondary | ICD-10-CM | POA: Diagnosis not present

## 2019-04-16 DIAGNOSIS — I110 Hypertensive heart disease with heart failure: Secondary | ICD-10-CM | POA: Diagnosis not present

## 2019-04-18 ENCOUNTER — Ambulatory Visit (INDEPENDENT_AMBULATORY_CARE_PROVIDER_SITE_OTHER): Payer: PRIVATE HEALTH INSURANCE | Admitting: Internal Medicine

## 2019-04-18 DIAGNOSIS — Z03818 Encounter for observation for suspected exposure to other biological agents ruled out: Secondary | ICD-10-CM | POA: Diagnosis not present

## 2019-04-19 ENCOUNTER — Telehealth (INDEPENDENT_AMBULATORY_CARE_PROVIDER_SITE_OTHER): Payer: Self-pay | Admitting: Nurse Practitioner

## 2019-04-19 ENCOUNTER — Ambulatory Visit (INDEPENDENT_AMBULATORY_CARE_PROVIDER_SITE_OTHER): Payer: Medicare Other | Admitting: Nurse Practitioner

## 2019-04-19 DIAGNOSIS — R531 Weakness: Secondary | ICD-10-CM | POA: Insufficient documentation

## 2019-04-19 DIAGNOSIS — I2510 Atherosclerotic heart disease of native coronary artery without angina pectoris: Secondary | ICD-10-CM | POA: Diagnosis not present

## 2019-04-19 DIAGNOSIS — N179 Acute kidney failure, unspecified: Secondary | ICD-10-CM | POA: Diagnosis not present

## 2019-04-19 DIAGNOSIS — J9601 Acute respiratory failure with hypoxia: Secondary | ICD-10-CM | POA: Diagnosis not present

## 2019-04-19 DIAGNOSIS — I110 Hypertensive heart disease with heart failure: Secondary | ICD-10-CM | POA: Diagnosis not present

## 2019-04-19 DIAGNOSIS — I5032 Chronic diastolic (congestive) heart failure: Secondary | ICD-10-CM | POA: Diagnosis not present

## 2019-04-19 DIAGNOSIS — J189 Pneumonia, unspecified organism: Secondary | ICD-10-CM | POA: Diagnosis not present

## 2019-04-19 NOTE — Assessment & Plan Note (Signed)
I do think that she would benefit from at home physical therapy to help regain some strength and stamina.  I will place an order for this.  She and the staff at [facility] were encouraged to call our office with any questions or concerns.

## 2019-04-19 NOTE — Telephone Encounter (Signed)
I do not see your patient has a follow-up appointment scheduled this.  I think because she is in assisted living facility she still sees Dr. Anastasio Champion as her primary care provider.  Thus can you set up a follow-up appointment in 6-8 weeks with him?  It probably should be done face-to-face in the office because she was recently discharged in the hospital and probably should have a physical exam completed by Dr. Anastasio Champion.

## 2019-04-19 NOTE — Progress Notes (Signed)
Due to national recommendations of social distancing related to the [place] pandemic, an audio/visual tele-health visit was felt to be the most appropriate encounter type for this patient today. I connected with  Regina Bush on 04/19/19 utilizing audio/visual technology and verified that I am speaking with the correct person using two identifiers. The patient was located at their home ([place]), and I was located at the office of [facility] during the encounter. I discussed the limitations of evaluation and management by telemedicine. The patient expressed understanding and agreed to proceed.  Visit was assisted by assisted living staff member [name].   Subjective:  Patient ID: Regina Bush, female    DOB: 1927-11-16  Age: 86 y.o. MRN: [id number]  CC: No chief complaint on file.     HPI  This patient arrives today for virtual visit for the above.  She was hospitalized approximately 1 month ago (03/18/2019) and was diagnosed with pneumonia.  She has since been discharged.  She was negative for flu as well as SARS-CoV-2.  She was treated with a course of doxycycline.  Today, she tells me she is feeling well and denies any shortness of breath or chest pain.  She does have a history of dementia. While in the hospital it seems that she lost some strength and stamina and is now requesting physical therapy to help regain some strength.  She has no acute complaints for me today.   Past Medical History:  Diagnosis Date  . Anemia   . Arthritis   . Cerebral infarction ([place])   . Coronary artery disease   . Dementia ([place])   . Diabetes mellitus without complication (HCC)    Type 2  . Dysphagia due to recent cerebral infarction   . Failure to thrive (0-17)   . Hypercalcemia   . Hypertension   . Pneumonitis   . Protein calorie malnutrition ([place])   . Subdural hematoma ([place])   . Thyroid disease       Family History  Family history unknown: Yes    Social  History   Social History Narrative   Widow,husband passed away 2020,married for 57 years.Retired.   Social History   Tobacco Use  . Smoking status: Never Smoker  . Smokeless tobacco: Never Used  Substance Use Topics  . Alcohol use: No     No outpatient medications have been marked as taking for the 04/19/19 encounter (Appointment) with Ailene Ards, NP.    ROS:  Review of Systems  Constitutional: Negative for fever and malaise/fatigue.  Respiratory: Positive for shortness of breath. Negative for cough and wheezing.   Cardiovascular: Negative for chest pain and palpitations.  Musculoskeletal:       (+) Weakness and shortness of breath with physical activity     Objective:   Today's Vitals: There were no vitals taken for this visit. Vitals with BMI 03/22/2019 03/21/2019 03/21/2019  Height - - -  Weight - - -  BMI - - -  Systolic [phone]  Diastolic 62 44 51  Pulse 62 69 64     Physical Exam Comprehensive physical exam not completed as office visit was conducted remotely.  On video she does appear well.  She seems to be in good spirits.  She seems to answer questions appropriately.  She is at least alert and oriented to herself.  She is well-groomed.  She was able to lift arms and move her legs.  She is in a wheelchair.  She  does admit to getting short of breath and fatigued with any activity.      Assessment   No diagnosis found.    Tests ordered No orders of the defined types were placed in this encounter.    Plan: Please see assessment and plan per problem list below.   No orders of the defined types were placed in this encounter.   Patient to follow-up in 3 months.  Video visit encounter lasted for approximately 6 minutes.  I spent 23 minutes dedicated to the care of this patient on the date of this encounter which includes a combination of either face-to-face or virtual contact with the patient, review of records , and ordering referral to home health  physical therapy.   Ailene Ards, NP

## 2019-04-22 DIAGNOSIS — E119 Type 2 diabetes mellitus without complications: Secondary | ICD-10-CM | POA: Diagnosis not present

## 2019-04-22 DIAGNOSIS — Z792 Long term (current) use of antibiotics: Secondary | ICD-10-CM | POA: Diagnosis not present

## 2019-04-22 DIAGNOSIS — I110 Hypertensive heart disease with heart failure: Secondary | ICD-10-CM | POA: Diagnosis not present

## 2019-04-22 DIAGNOSIS — Z66 Do not resuscitate: Secondary | ICD-10-CM | POA: Diagnosis not present

## 2019-04-22 DIAGNOSIS — Z993 Dependence on wheelchair: Secondary | ICD-10-CM | POA: Diagnosis not present

## 2019-04-22 DIAGNOSIS — J189 Pneumonia, unspecified organism: Secondary | ICD-10-CM | POA: Diagnosis not present

## 2019-04-22 DIAGNOSIS — I5032 Chronic diastolic (congestive) heart failure: Secondary | ICD-10-CM | POA: Diagnosis not present

## 2019-04-22 DIAGNOSIS — Z8673 Personal history of transient ischemic attack (TIA), and cerebral infarction without residual deficits: Secondary | ICD-10-CM | POA: Diagnosis not present

## 2019-04-22 DIAGNOSIS — N179 Acute kidney failure, unspecified: Secondary | ICD-10-CM | POA: Diagnosis not present

## 2019-04-22 DIAGNOSIS — I2510 Atherosclerotic heart disease of native coronary artery without angina pectoris: Secondary | ICD-10-CM | POA: Diagnosis not present

## 2019-04-22 DIAGNOSIS — Z9181 History of falling: Secondary | ICD-10-CM | POA: Diagnosis not present

## 2019-04-22 DIAGNOSIS — Z7982 Long term (current) use of aspirin: Secondary | ICD-10-CM | POA: Diagnosis not present

## 2019-04-22 DIAGNOSIS — Z794 Long term (current) use of insulin: Secondary | ICD-10-CM | POA: Diagnosis not present

## 2019-04-22 DIAGNOSIS — I700 Atherosclerosis of aorta: Secondary | ICD-10-CM | POA: Diagnosis not present

## 2019-04-22 DIAGNOSIS — J9601 Acute respiratory failure with hypoxia: Secondary | ICD-10-CM | POA: Diagnosis not present

## 2019-04-22 DIAGNOSIS — F0390 Unspecified dementia without behavioral disturbance: Secondary | ICD-10-CM | POA: Diagnosis not present

## 2019-04-23 DIAGNOSIS — I2510 Atherosclerotic heart disease of native coronary artery without angina pectoris: Secondary | ICD-10-CM | POA: Diagnosis not present

## 2019-04-23 DIAGNOSIS — I110 Hypertensive heart disease with heart failure: Secondary | ICD-10-CM | POA: Diagnosis not present

## 2019-04-23 DIAGNOSIS — N179 Acute kidney failure, unspecified: Secondary | ICD-10-CM | POA: Diagnosis not present

## 2019-04-23 DIAGNOSIS — I5032 Chronic diastolic (congestive) heart failure: Secondary | ICD-10-CM | POA: Diagnosis not present

## 2019-04-23 DIAGNOSIS — J189 Pneumonia, unspecified organism: Secondary | ICD-10-CM | POA: Diagnosis not present

## 2019-04-23 DIAGNOSIS — Z03818 Encounter for observation for suspected exposure to other biological agents ruled out: Secondary | ICD-10-CM | POA: Diagnosis not present

## 2019-04-23 DIAGNOSIS — J9601 Acute respiratory failure with hypoxia: Secondary | ICD-10-CM | POA: Diagnosis not present

## 2019-04-24 ENCOUNTER — Encounter (INDEPENDENT_AMBULATORY_CARE_PROVIDER_SITE_OTHER): Payer: Self-pay | Admitting: Internal Medicine

## 2019-04-24 ENCOUNTER — Ambulatory Visit (INDEPENDENT_AMBULATORY_CARE_PROVIDER_SITE_OTHER): Payer: Medicare Other | Admitting: Internal Medicine

## 2019-04-24 DIAGNOSIS — L299 Pruritus, unspecified: Secondary | ICD-10-CM | POA: Diagnosis not present

## 2019-04-24 MED ORDER — PREDNISONE 20 MG PO TABS: 40.0000 mg | ORAL_TABLET | Freq: Every day | ORAL | 1 refills | Status: DC

## 2019-04-24 NOTE — Progress Notes (Signed)
Metrics: Intervention Frequency ACO  Documented Smoking Status Yearly  Screened one or more times in 24 months  Cessation Counseling or  Active cessation medication Past 24 months  Past 24 months   Guideline developer: UpToDate (See UpToDate for funding source) Date Released: 2014       Wellness Office Visit  Subjective:  Patient ID: Regina Bush, female    DOB: Jul 04, 1927  Age: 86 y.o. MRN: [id number]  CC: This is an audio telemedicine visit with the permission of the patient who is in the facility that she lives at.  I was able to easily recognize her voice. Her main complaint is itching. HPI  She apparently has had itching all over her body for the last several days.  There is absolutely no rash that has been seen.  There has been no change in medications.  She apparently does take Zyrtec but I am not sure that she takes it on a daily basis.  There is no fever this been documented.  She did have her second dose of COVID-19 vaccination about 1 week ago. Past Medical History:  Diagnosis Date  . Anemia   . Arthritis   . Cerebral infarction ([place])   . Coronary artery disease   . Dementia ([place])   . Diabetes mellitus without complication (HCC)    Type 2  . Dysphagia due to recent cerebral infarction   . Failure to thrive (0-17)   . Hypercalcemia   . Hypertension   . Pneumonitis   . Protein calorie malnutrition ([place])   . Subdural hematoma ([place])   . Thyroid disease       Family History  Family history unknown: Yes    Social History   Social History Narrative   Widow,husband passed away 2020,married for 57 years.Retired.   Social History   Tobacco Use  . Smoking status: Never Smoker  . Smokeless tobacco: Never Used  Substance Use Topics  . Alcohol use: No    Current Meds  Medication Sig  . acetaminophen (TYLENOL) 325 MG tablet Take 2 tablets (650 mg total) by mouth every 6 (six) hours as needed for mild pain or moderate pain.  Marland Kitchen amLODipine (NORVASC) 10 MG tablet  Take 10 mg by mouth daily.   Marland Kitchen aspirin EC 81 MG tablet Take 81 mg by mouth daily.  Marland Kitchen atorvastatin (LIPITOR) 20 MG tablet Take 20 mg by mouth daily.   . cetirizine (ZYRTEC) 10 MG tablet Take 10 mg by mouth at bedtime as needed for allergies.  . cholecalciferol (VITAMIN D) 1000 units tablet Take 1,000 Units by mouth daily.   . famotidine (PEPCID) 20 MG tablet Take 20 mg by mouth daily.  . fluticasone (FLONASE) 50 MCG/ACT nasal spray Place 2 sprays into both nostrils daily.  . hydrALAZINE (APRESOLINE) 25 MG tablet Take 25 mg by mouth 3 (three) times daily. Give 1 tablet by mouth three times a day for HTN hold for systolic BP <998  . insulin glargine (LANTUS) 100 UNIT/ML injection Inject 0.04 mLs (4 Units total) into the skin at bedtime.  Marland Kitchen ketoconazole (NIZORAL) 2 % cream Apply 1 application topically daily. Apply to abdominal folds under breast, groin area and sacrum area where redness and rash is located report to provider if no improvements is noted.  Marland Kitchen levothyroxine (SYNTHROID, LEVOTHROID) 50 MCG tablet Take 50 mcg by mouth daily before breakfast.   . magnesium oxide (MAG-OX) 400 MG tablet Take 400 mg by mouth daily.   . Multiple Vitamin (MULTIVITAMIN WITH MINERALS)  TABS tablet Take 1 tablet by mouth daily.   . tamsulosin (FLOMAX) 0.4 MG CAPS capsule Take 0.4 mg by mouth every evening.  . traZODone (DESYREL) 50 MG tablet Take 50 mg by mouth at bedtime.  Marland Kitchen trimethoprim (TRIMPEX) 100 MG tablet Take 1 tablet (100 mg total) by mouth daily.  . Wheat Dextrin (BENEFIBER PO) Take 10 mLs by mouth daily.       Objective:   Today's Vitals: There were no vitals taken for this visit. Vitals with BMI 04/24/2019 04/19/2019 03/22/2019  Height (No Data) - -  Weight (No Data) - -  BMI - - -  Systolic (No Data) 517 616  Diastolic (No Data) 70 62  Pulse - 72 62     Physical Exam   Her speech was normal on the phone and she appeared to be alert.    Assessment   1. Itching       Tests ordered No  orders of the defined types were placed in this encounter.    Plan: 1. The etiology of her symptoms of itching are not clear to me.  She does have renal dysfunction but it has been stable.  I am going to empirically try her on some prednisone to see if this will help her.  Next week, if she is no better, she will need to come in to be seen by Judson Roch, the nurse practitioner for further evaluation. 2. This phone call lasted 5 minutes and 3 seconds.   Meds ordered this encounter  Medications  . predniSONE (DELTASONE) 20 MG tablet    Sig: Take 2 tablets (40 mg total) by mouth daily with breakfast.    Dispense:  10 tablet    Refill:  1    Hiral Lukasiewicz Luther Parody, MD

## 2019-04-25 ENCOUNTER — Telehealth (INDEPENDENT_AMBULATORY_CARE_PROVIDER_SITE_OTHER): Payer: Self-pay | Admitting: Internal Medicine

## 2019-04-25 DIAGNOSIS — Z03818 Encounter for observation for suspected exposure to other biological agents ruled out: Secondary | ICD-10-CM | POA: Diagnosis not present

## 2019-04-25 NOTE — Telephone Encounter (Signed)
I spoke to the caregiver at [facility].  The blood glucose is 535 and then went down to 510.  She had been started on prednisone for pruritus.  I told the caregiver to give her Lantus insulin 10 units now and check the blood glucose in 2 hours.  If the blood glucose is still above 300, she can give 10 units of Lantus insulin tonight.  She will likely need extra insulin during that next few days when she is taking prednisone. If the patient starts to feel unwell, she will need to go to the emergency room.

## 2019-04-30 DIAGNOSIS — I2510 Atherosclerotic heart disease of native coronary artery without angina pectoris: Secondary | ICD-10-CM | POA: Diagnosis not present

## 2019-04-30 DIAGNOSIS — J189 Pneumonia, unspecified organism: Secondary | ICD-10-CM | POA: Diagnosis not present

## 2019-04-30 DIAGNOSIS — I110 Hypertensive heart disease with heart failure: Secondary | ICD-10-CM | POA: Diagnosis not present

## 2019-04-30 DIAGNOSIS — Z03818 Encounter for observation for suspected exposure to other biological agents ruled out: Secondary | ICD-10-CM | POA: Diagnosis not present

## 2019-04-30 DIAGNOSIS — J9601 Acute respiratory failure with hypoxia: Secondary | ICD-10-CM | POA: Diagnosis not present

## 2019-04-30 DIAGNOSIS — N179 Acute kidney failure, unspecified: Secondary | ICD-10-CM | POA: Diagnosis not present

## 2019-04-30 DIAGNOSIS — I5032 Chronic diastolic (congestive) heart failure: Secondary | ICD-10-CM | POA: Diagnosis not present

## 2019-05-02 DIAGNOSIS — Z03818 Encounter for observation for suspected exposure to other biological agents ruled out: Secondary | ICD-10-CM | POA: Diagnosis not present

## 2019-05-07 DIAGNOSIS — Z03818 Encounter for observation for suspected exposure to other biological agents ruled out: Secondary | ICD-10-CM | POA: Diagnosis not present

## 2019-05-09 DIAGNOSIS — M79674 Pain in right toe(s): Secondary | ICD-10-CM | POA: Diagnosis not present

## 2019-05-09 DIAGNOSIS — B351 Tinea unguium: Secondary | ICD-10-CM | POA: Diagnosis not present

## 2019-05-09 DIAGNOSIS — Z03818 Encounter for observation for suspected exposure to other biological agents ruled out: Secondary | ICD-10-CM | POA: Diagnosis not present

## 2019-05-09 DIAGNOSIS — M79675 Pain in left toe(s): Secondary | ICD-10-CM | POA: Diagnosis not present

## 2019-05-15 DIAGNOSIS — Z23 Encounter for immunization: Secondary | ICD-10-CM | POA: Diagnosis not present

## 2019-05-23 ENCOUNTER — Ambulatory Visit (INDEPENDENT_AMBULATORY_CARE_PROVIDER_SITE_OTHER): Payer: Medicare Other | Admitting: Internal Medicine

## 2019-05-23 ENCOUNTER — Encounter (INDEPENDENT_AMBULATORY_CARE_PROVIDER_SITE_OTHER): Payer: Self-pay | Admitting: Internal Medicine

## 2019-05-23 ENCOUNTER — Ambulatory Visit (INDEPENDENT_AMBULATORY_CARE_PROVIDER_SITE_OTHER): Payer: PRIVATE HEALTH INSURANCE | Admitting: Internal Medicine

## 2019-05-23 ENCOUNTER — Other Ambulatory Visit: Payer: Self-pay

## 2019-05-23 VITALS — BP 120/68 | HR 59 | Temp 97.6°F | Ht 65.0 in | Wt 176.2 lb

## 2019-05-23 DIAGNOSIS — E039 Hypothyroidism, unspecified: Secondary | ICD-10-CM | POA: Diagnosis not present

## 2019-05-23 DIAGNOSIS — E559 Vitamin D deficiency, unspecified: Secondary | ICD-10-CM | POA: Diagnosis not present

## 2019-05-23 DIAGNOSIS — E119 Type 2 diabetes mellitus without complications: Secondary | ICD-10-CM

## 2019-05-23 DIAGNOSIS — I10 Essential (primary) hypertension: Secondary | ICD-10-CM

## 2019-05-23 NOTE — Progress Notes (Signed)
Metrics: Intervention Frequency ACO  Documented Smoking Status Yearly  Screened one or more times in 24 months  Cessation Counseling or  Active cessation medication Past 24 months  Past 24 months   Guideline developer: UpToDate (See UpToDate for funding source) Date Released: 2014       Wellness Office Visit  Subjective:  Patient ID: Regina Bush, female    DOB: April 19, 1927  Age: 86 y.o. MRN: [id number]  CC: This lady comes in for follow-up of diabetes, hypothyroidism, hyperlipidemia and hypertension.  HPI She has also underlying dementia.  She is doing very well.  She has no complaints today.  She lives in an assisted living facility and they take very good care of her and monitor her very well.  She had a hemoglobin A1c done in January of this year when she was in the hospital and it was 7.3%.  Past Medical History:  Diagnosis Date  . Anemia   . Arthritis   . Cerebral infarction ([place])   . Coronary artery disease   . Dementia ([place])   . Diabetes mellitus without complication (HCC)    Type 2  . Dysphagia due to recent cerebral infarction   . Failure to thrive (0-17)   . Hypercalcemia   . Hypertension   . Pneumonitis   . Protein calorie malnutrition ([place])   . Subdural hematoma ([place])   . Thyroid disease       Family History  Family history unknown: Yes    Social History   Social History Narrative   Widow,husband passed away 2020,married for 57 years.Retired.   Social History   Tobacco Use  . Smoking status: Never Smoker  . Smokeless tobacco: Never Used  Substance Use Topics  . Alcohol use: No    Current Meds  Medication Sig  . acetaminophen (TYLENOL) 325 MG tablet Take 2 tablets (650 mg total) by mouth every 6 (six) hours as needed for mild pain or moderate pain.  Marland Kitchen amLODipine (NORVASC) 10 MG tablet Take 10 mg by mouth daily.   Marland Kitchen aspirin EC 81 MG tablet Take 81 mg by mouth daily.  Marland Kitchen atorvastatin (LIPITOR) 20 MG tablet Take 20 mg by mouth daily.   .  cetirizine (ZYRTEC) 10 MG tablet Take 10 mg by mouth at bedtime as needed for allergies.  . cholecalciferol (VITAMIN D) 1000 units tablet Take 1,000 Units by mouth daily.   . famotidine (PEPCID) 20 MG tablet Take 20 mg by mouth daily.  . fluticasone (FLONASE) 50 MCG/ACT nasal spray Place 2 sprays into both nostrils daily.  . hydrALAZINE (APRESOLINE) 25 MG tablet Take 25 mg by mouth 3 (three) times daily. Give 1 tablet by mouth three times a day for HTN hold for systolic BP <035  . insulin glargine (LANTUS) 100 UNIT/ML injection Inject 0.04 mLs (4 Units total) into the skin at bedtime.  Marland Kitchen levothyroxine (SYNTHROID, LEVOTHROID) 50 MCG tablet Take 50 mcg by mouth daily before breakfast.   . magnesium oxide (MAG-OX) 400 MG tablet Take 400 mg by mouth daily.   . miconazole (MICOTIN) 2 % cream Apply 1 application topically 2 (two) times daily.  . Multiple Vitamin (MULTIVITAMIN WITH MINERALS) TABS tablet Take 1 tablet by mouth daily.   . tamsulosin (FLOMAX) 0.4 MG CAPS capsule Take 0.4 mg by mouth every evening.  . traZODone (DESYREL) 50 MG tablet Take 50 mg by mouth at bedtime.  Marland Kitchen trimethoprim (TRIMPEX) 100 MG tablet Take 1 tablet (100 mg total) by mouth daily.  . Wheat  Dextrin (BENEFIBER PO) Take 10 mLs by mouth daily.       Objective:   Today's Vitals: BP 120/68 (BP Location: Left Arm, Patient Position: Sitting, Cuff Size: Normal)   Pulse (!) 59   Temp 97.6 F (36.4 C) (Temporal)   Ht 5\' 5"  (1.651 m)   Wt 176 lb 3.2 oz (79.9 kg)   SpO2 91%   BMI 29.32 kg/m  Vitals with BMI 05/23/2019 04/24/2019 04/19/2019  Height 5\' 5"  (No Data) -  Weight 176 lbs 3 oz (No Data) -  BMI 42.35 - -  Systolic 361 (No Data) 443  Diastolic 68 (No Data) 70  Pulse 59 - 72     Physical Exam    She looks systemically well.  Blood pressure well controlled.  Lung fields are clear.  She appears to be alert and orientated in time and place.   Assessment   1. Acquired hypothyroidism   2. Essential hypertension    3. Diabetes mellitus without complication ([place])   4. Vitamin D deficiency disease       Tests ordered Orders Placed This Encounter  Procedures  . CBC  . COMPLETE METABOLIC PANEL WITH GFR  . VITAMIN D 25 Hydroxy (Vit-D Deficiency, Fractures)  . T3, free  . T4  . TSH     Plan: 1. Blood work is ordered above. 2. She will continue with the same dose of levothyroxine and we will see if we need to adjust the dose. 3. She will continue with antihypertensive therapy and her blood pressure seems to be under good control. 4. I recommended that she discontinue Lantus 4 units every evening and only use as needed insulin as needed when blood glucose levels are elevated significantly.  I do not think at her age we need to have tight control. 5. Further recommendations will depend on blood results and I will see her back for follow-up in 3 months.   No orders of the defined types were placed in this encounter.   Doree Albee, MD

## 2019-05-24 ENCOUNTER — Other Ambulatory Visit (INDEPENDENT_AMBULATORY_CARE_PROVIDER_SITE_OTHER): Payer: Self-pay | Admitting: Internal Medicine

## 2019-05-24 LAB — VITAMIN D 25 HYDROXY (VIT D DEFICIENCY, FRACTURES): Vit D, 25-Hydroxy: 24 ng/mL — ABNORMAL LOW (ref 30–100)

## 2019-05-24 LAB — CBC
HCT: 35.5 % (ref 35.0–45.0)
Hemoglobin: 11.7 g/dL (ref 11.7–15.5)
MCH: 31.5 pg (ref 27.0–33.0)
MCHC: 33 g/dL (ref 32.0–36.0)
MCV: 95.7 fL (ref 80.0–100.0)
MPV: 10.8 fL (ref 7.5–12.5)
Platelets: 252 10*3/uL (ref 140–400)
RBC: 3.71 10*6/uL — ABNORMAL LOW (ref 3.80–5.10)
RDW: 13.7 % (ref 11.0–15.0)
WBC: 8.1 10*3/uL (ref 3.8–10.8)

## 2019-05-24 LAB — COMPLETE METABOLIC PANEL WITH GFR
AG Ratio: 1.5 (calc) (ref 1.0–2.5)
ALT: 9 U/L (ref 6–29)
AST: 11 U/L (ref 10–35)
Albumin: 3.2 g/dL — ABNORMAL LOW (ref 3.6–5.1)
Alkaline phosphatase (APISO): 77 U/L (ref 37–153)
BUN/Creatinine Ratio: 16 (calc) (ref 6–22)
BUN: 33 mg/dL — ABNORMAL HIGH (ref 7–25)
CO2: 25 mmol/L (ref 20–32)
Calcium: 9.4 mg/dL (ref 8.6–10.4)
Chloride: 109 mmol/L (ref 98–110)
Creat: 2.06 mg/dL — ABNORMAL HIGH (ref 0.60–0.88)
GFR, Est African American: 24 mL/min/{1.73_m2} — ABNORMAL LOW (ref >=60)
GFR, Est Non African American: 21 mL/min/{1.73_m2} — ABNORMAL LOW (ref >=60)
Globulin: 2.2 g/dL (calc) (ref 1.9–3.7)
Glucose, Bld: 234 mg/dL — ABNORMAL HIGH (ref 65–99)
Potassium: 5.8 mmol/L — ABNORMAL HIGH (ref 3.5–5.3)
Sodium: 141 mmol/L (ref 135–146)
Total Bilirubin: 0.3 mg/dL (ref 0.2–1.2)
Total Protein: 5.4 g/dL — ABNORMAL LOW (ref 6.1–8.1)

## 2019-05-24 LAB — T4: T4, Total: 9.3 ug/dL (ref 5.1–11.9)

## 2019-05-24 LAB — T3, FREE: T3, Free: 2.4 pg/mL (ref 2.3–4.2)

## 2019-05-24 LAB — TSH: TSH: 8.51 m[IU]/L — ABNORMAL HIGH (ref 0.40–4.50)

## 2019-05-24 MED ORDER — LEVOTHYROXINE SODIUM 75 MCG PO TABS: 75.0000 ug | ORAL_TABLET | Freq: Every day | ORAL | 1 refills | Status: DC

## 2019-05-24 MED ORDER — VITAMIN D3 125 MCG (5000 UT) PO TABS: 2.0000 | ORAL_TABLET | Freq: Every day | ORAL | 3 refills | Status: DC

## 2019-05-24 NOTE — Progress Notes (Signed)
Pt instructions given to [place]. Pt will need a new order sent to pharmacy for the Vitamin D3 5000iu; to tke the 10,000 units daily. She was only taking 1,000 units /day. Pt will start new levothyroxine when it arrives from delivery.  Pt will be encourage to drink more water. She doesn't like to drink too much.

## 2019-06-25 ENCOUNTER — Ambulatory Visit (INDEPENDENT_AMBULATORY_CARE_PROVIDER_SITE_OTHER): Payer: Medicare Other | Admitting: Internal Medicine

## 2019-07-17 DIAGNOSIS — B351 Tinea unguium: Secondary | ICD-10-CM | POA: Diagnosis not present

## 2019-07-17 DIAGNOSIS — M79675 Pain in left toe(s): Secondary | ICD-10-CM | POA: Diagnosis not present

## 2019-07-17 DIAGNOSIS — M79674 Pain in right toe(s): Secondary | ICD-10-CM | POA: Diagnosis not present

## 2019-07-24 ENCOUNTER — Other Ambulatory Visit (HOSPITAL_COMMUNITY)
Admission: RE | Admit: 2019-07-24 | Discharge: 2019-07-24 | Disposition: A | Discharge status: Home / Self Care | Payer: Medicare Other | Source: Skilled Nursing Facility | Attending: Internal Medicine | Admitting: Internal Medicine

## 2019-07-24 DIAGNOSIS — N390 Urinary tract infection, site not specified: Secondary | ICD-10-CM | POA: Insufficient documentation

## 2019-07-24 LAB — URINALYSIS, ROUTINE W REFLEX MICROSCOPIC
Bilirubin Urine: NEGATIVE
Glucose, UA: 150 mg/dL — AB
Hgb urine dipstick: NEGATIVE
Ketones, ur: NEGATIVE mg/dL
Leukocytes,Ua: NEGATIVE
Nitrite: NEGATIVE
Protein, ur: >=300 mg/dL — AB
Specific Gravity, Urine: 1.024 (ref 1.005–1.030)
pH: 5 (ref 5.0–8.0)

## 2019-07-25 NOTE — Progress Notes (Signed)
Patient called.  NO answer from ; left voicemail on [facility]. Explaining Call the home whee this patient lives and find out what is going on.  The last I heard, she was supposed to go to the emergency room but I do not see the visit took place.  Let me know back the status of her location & if she went to ER as verbal ordered.Regina Bush

## 2019-08-28 ENCOUNTER — Ambulatory Visit (INDEPENDENT_AMBULATORY_CARE_PROVIDER_SITE_OTHER): Payer: Medicare Other | Admitting: Internal Medicine

## 2019-08-28 ENCOUNTER — Encounter (INDEPENDENT_AMBULATORY_CARE_PROVIDER_SITE_OTHER): Payer: Self-pay

## 2019-08-28 ENCOUNTER — Encounter (INDEPENDENT_AMBULATORY_CARE_PROVIDER_SITE_OTHER): Payer: Self-pay | Admitting: Internal Medicine

## 2019-08-28 ENCOUNTER — Other Ambulatory Visit: Payer: Self-pay

## 2019-08-28 VITALS — BP 130/70 | HR 72 | Temp 97.6°F | Resp 18 | Ht 65.0 in | Wt 174.6 lb

## 2019-08-28 DIAGNOSIS — E039 Hypothyroidism, unspecified: Secondary | ICD-10-CM

## 2019-08-28 DIAGNOSIS — E559 Vitamin D deficiency, unspecified: Secondary | ICD-10-CM | POA: Diagnosis not present

## 2019-08-28 DIAGNOSIS — E119 Type 2 diabetes mellitus without complications: Secondary | ICD-10-CM | POA: Diagnosis not present

## 2019-08-28 DIAGNOSIS — I10 Essential (primary) hypertension: Secondary | ICD-10-CM

## 2019-08-28 NOTE — Progress Notes (Signed)
Metrics: Intervention Frequency ACO  Documented Smoking Status Yearly  Screened one or more times in 24 months  Cessation Counseling or  Active cessation medication Past 24 months  Past 24 months   Guideline developer: UpToDate (See UpToDate for funding source) Date Released: 2014       Wellness Office Visit  Subjective:  Patient ID: Regina Bush, female    DOB: [birth year]  Age: 86 y.o. MRN: [id number]  CC: This lady comes in for follow-up of hypertension, diabetes, hypothyroidism.  She also has vitamin D deficiency. HPI  On the last visit, I started her on levothyroxine with elevated TSH levels.  She has tolerated the dose that I prescribed. She continues on amlodipine and hydralazine for hypertension without any problems. As far as her diabetes is concerned, I discontinued insulin as she was on a very small dose.  She is eating mostly vegetables she tells me. She continues to take vitamin D3 10,000 units daily. She keeps mobile and we also self on a wheelchair throughout the day.  Sometimes she gets dyspneic on exertion but not severely.  Since the last time I saw her, she apparently had a UTI which was treated.  She has a history of recurrent UTIs.  Past Medical History:  Diagnosis Date  . Anemia   . Arthritis   . Cerebral infarction ([place])   . Coronary artery disease   . Dementia ([place])   . Diabetes mellitus without complication (HCC)    Type 2  . Dysphagia due to recent cerebral infarction   . Failure to thrive (0-17)   . Hypercalcemia   . Hypertension   . Pneumonitis   . Protein calorie malnutrition ([place])   . Subdural hematoma ([place])   . Thyroid disease    History reviewed. No pertinent surgical history.   Family History  Family history unknown: Yes    Social History   Social History Narrative   Widow,husband passed away 2020,married for 77 years.Retired.   Social History   Tobacco Use  . Smoking status: Never Smoker  . Smokeless tobacco: Never Used    Substance Use Topics  . Alcohol use: No    Current Meds  Medication Sig  . acetaminophen (TYLENOL) 325 MG tablet Take 2 tablets (650 mg total) by mouth every 6 (six) hours as needed for mild pain or moderate pain.  Marland Kitchen amLODipine (NORVASC) 10 MG tablet Take 10 mg by mouth daily.   Marland Kitchen aspirin EC 81 MG tablet Take 81 mg by mouth daily.  Marland Kitchen atorvastatin (LIPITOR) 20 MG tablet Take 20 mg by mouth daily.   . cetirizine (ZYRTEC) 10 MG tablet Take 10 mg by mouth at bedtime as needed for allergies.  . Cholecalciferol (VITAMIN D3) 125 MCG (5000 UT) TABS Take 5,000 Units by mouth 2 (two) times daily.   . Cholecalciferol (VITAMIN D3) 125 MCG (5000 UT) TABS Take 2 tablets (10,000 Units total) by mouth daily.  . famotidine (PEPCID) 20 MG tablet Take 20 mg by mouth daily.  . fluticasone (FLONASE) 50 MCG/ACT nasal spray Place 2 sprays into both nostrils daily.  . hydrALAZINE (APRESOLINE) 25 MG tablet Take 25 mg by mouth 3 (three) times daily. Give 1 tablet by mouth three times a day for HTN hold for systolic BP <309  . levothyroxine (SYNTHROID) 75 MCG tablet Take 1 tablet (75 mcg total) by mouth daily.  . magnesium oxide (MAG-OX) 400 MG tablet Take 400 mg by mouth daily.   . miconazole (MICOTIN) 2 % cream Apply  1 application topically 2 (two) times daily.  . Multiple Vitamin (MULTIVITAMIN WITH MINERALS) TABS tablet Take 1 tablet by mouth daily.   . tamsulosin (FLOMAX) 0.4 MG CAPS capsule Take 0.4 mg by mouth every evening.  . traZODone (DESYREL) 50 MG tablet Take 50 mg by mouth at bedtime.  Marland Kitchen trimethoprim (TRIMPEX) 100 MG tablet Take 1 tablet (100 mg total) by mouth daily.  . Wheat Dextrin (BENEFIBER PO) Take 10 mLs by mouth daily.   . [DISCONTINUED] insulin glargine (LANTUS) 100 UNIT/ML injection Inject 0.04 mLs (4 Units total) into the skin at bedtime.        Depression screen [facility] 2/9 05/23/2019  Decreased Interest 0  Down, Depressed, Hopeless 0  PHQ - 2 Score 0     Objective:   Today's  Vitals: BP 130/70 (BP Location: Left Arm, Patient Position: Sitting, Cuff Size: Normal)   Pulse 72   Temp 97.6 F (36.4 C) (Temporal)   Resp 18   Ht 5\' 5"  (1.651 m)   Wt 174 lb 9.6 oz (79.2 kg)   SpO2 98%   BMI 29.05 kg/m  Vitals with BMI 08/28/2019 05/23/2019 04/24/2019  Height 5\' 5"  5\' 5"  (No Data)  Weight 174 lbs 10 oz 176 lbs 3 oz (No Data)  BMI 76.16 07.37 -  Systolic 106 269 (No Data)  Diastolic 70 68 (No Data)  Pulse 72 59 -     Physical Exam       Assessment   1. Essential hypertension   2. Acquired hypothyroidism   3. Diabetes mellitus without complication ([place])   4. Vitamin D deficiency disease       Tests ordered Orders Placed This Encounter  Procedures  . COMPLETE METABOLIC PANEL WITH GFR  . Hemoglobin A1c  . VITAMIN D 25 Hydroxy (Vit-D Deficiency, Fractures)  . T3, free  . T4  . TSH     Plan: 1. Blood work is ordered. 2. She will continue with amlodipine and hydralazine for hypertension which seems to be well controlled. 3. She will continue with same dose of levothyroxine and we will see if we need to adjust the dose further based on her thyroid function test. 4. As far as her diabetes is concerned, I do not think she really needs any medications but we will check an A1c now. 5. She will continue with vitamin D3 10,000 units daily and I will check levels today. 6. Further recommendations will depend on results and I will see her in 3 months time for follow-up   No orders of the defined types were placed in this encounter.   Doree Albee, MD

## 2019-08-29 ENCOUNTER — Other Ambulatory Visit (INDEPENDENT_AMBULATORY_CARE_PROVIDER_SITE_OTHER): Payer: Self-pay | Admitting: Internal Medicine

## 2019-08-29 ENCOUNTER — Telehealth (INDEPENDENT_AMBULATORY_CARE_PROVIDER_SITE_OTHER): Payer: Self-pay

## 2019-08-29 LAB — COMPLETE METABOLIC PANEL WITH GFR
AG Ratio: 1.6 (calc) (ref 1.0–2.5)
ALT: 8 U/L (ref 6–29)
AST: 9 U/L — ABNORMAL LOW (ref 10–35)
Albumin: 3.2 g/dL — ABNORMAL LOW (ref 3.6–5.1)
Alkaline phosphatase (APISO): 62 U/L (ref 37–153)
BUN/Creatinine Ratio: 15 (calc) (ref 6–22)
BUN: 38 mg/dL — ABNORMAL HIGH (ref 7–25)
CO2: 20 mmol/L (ref 20–32)
Calcium: 8.7 mg/dL (ref 8.6–10.4)
Chloride: 111 mmol/L — ABNORMAL HIGH (ref 98–110)
Creat: 2.5 mg/dL — ABNORMAL HIGH (ref 0.60–0.88)
GFR, Est African American: 19 mL/min/{1.73_m2} — ABNORMAL LOW (ref >=60)
GFR, Est Non African American: 16 mL/min/{1.73_m2} — ABNORMAL LOW (ref >=60)
Globulin: 2 g/dL (calc) (ref 1.9–3.7)
Glucose, Bld: 181 mg/dL — ABNORMAL HIGH (ref 65–99)
Potassium: 5.9 mmol/L — ABNORMAL HIGH (ref 3.5–5.3)
Sodium: 140 mmol/L (ref 135–146)
Total Bilirubin: 0.3 mg/dL (ref 0.2–1.2)
Total Protein: 5.2 g/dL — ABNORMAL LOW (ref 6.1–8.1)

## 2019-08-29 LAB — HEMOGLOBIN A1C
Hgb A1c MFr Bld: 7.5 % of total Hgb — ABNORMAL HIGH (ref <5.7)
Mean Plasma Glucose: 169 (calc)
eAG (mmol/L): 9.3 (calc)

## 2019-08-29 LAB — VITAMIN D 25 HYDROXY (VIT D DEFICIENCY, FRACTURES): Vit D, 25-Hydroxy: 42 ng/mL (ref 30–100)

## 2019-08-29 LAB — T3, FREE: T3, Free: 2.7 pg/mL (ref 2.3–4.2)

## 2019-08-29 LAB — TSH: TSH: 6.92 mIU/L — ABNORMAL HIGH (ref 0.40–4.50)

## 2019-08-29 LAB — T4: T4, Total: 8.2 ug/dL (ref 5.1–11.9)

## 2019-08-29 MED ORDER — LEVOTHYROXINE SODIUM 100 MCG PO TABS: 100.0000 ug | ORAL_TABLET | Freq: Every day | ORAL | 1 refills | Status: DC

## 2019-08-29 NOTE — Telephone Encounter (Signed)
-----   Message from Doree Albee, MD sent at 08/29/2019  8:46 AM EDT ----- Please call this patient and the assisted living facility.  Her kidney function is getting worse.  She needs to drink more water every day.  Also her thyroid dose needs to be increased and I have sent a new prescription of levothyroxine 100 mcg daily to the pharmacy that she uses.  She needs to start the new dose now.Please make her an appointment to follow-up soon in about 2 to 3 weeks time so we can make sure that renal function is improving as long as she is drinking plenty of water.

## 2019-08-29 NOTE — Progress Notes (Signed)
Called left message with Janett Billow nurse for [facility].

## 2019-08-29 NOTE — Progress Notes (Signed)
Please call this patient and the assisted living facility.  Her kidney function is getting worse.  She needs to drink more water every day.  Also her thyroid dose needs to be increased and I have sent a new prescription of levothyroxine 100 mcg daily to the pharmacy that she uses.  She needs to start the new dose now.Please make her an appointment to follow-up soon in about 2 to 3 weeks time so we can make sure that renal function is improving as long as she is drinking plenty of water.

## 2019-09-25 DIAGNOSIS — M79675 Pain in left toe(s): Secondary | ICD-10-CM | POA: Diagnosis not present

## 2019-09-25 DIAGNOSIS — B351 Tinea unguium: Secondary | ICD-10-CM | POA: Diagnosis not present

## 2019-09-25 DIAGNOSIS — M79674 Pain in right toe(s): Secondary | ICD-10-CM | POA: Diagnosis not present

## 2019-10-30 ENCOUNTER — Ambulatory Visit (INDEPENDENT_AMBULATORY_CARE_PROVIDER_SITE_OTHER): Payer: Medicare Other | Admitting: Internal Medicine

## 2019-10-30 ENCOUNTER — Encounter (INDEPENDENT_AMBULATORY_CARE_PROVIDER_SITE_OTHER): Payer: Self-pay | Admitting: Internal Medicine

## 2019-10-30 ENCOUNTER — Other Ambulatory Visit: Payer: Self-pay

## 2019-10-30 VITALS — BP 144/68 | HR 66 | Temp 97.5°F | Resp 19 | Ht 65.0 in | Wt 174.0 lb

## 2019-10-30 DIAGNOSIS — G2581 Restless legs syndrome: Secondary | ICD-10-CM | POA: Diagnosis not present

## 2019-10-30 MED ORDER — ROPINIROLE HCL 0.25 MG PO TABS: 0.2500 mg | ORAL_TABLET | Freq: Every day | ORAL | 3 refills | Status: DC

## 2019-10-30 NOTE — Progress Notes (Signed)
Metrics: Intervention Frequency ACO  Documented Smoking Status Yearly  Screened one or more times in 24 months  Cessation Counseling or  Active cessation medication Past 24 months  Past 24 months   Guideline developer: UpToDate (See UpToDate for funding source) Date Released: 2014       Wellness Office Visit  Subjective:  Patient ID: Regina Bush, female    DOB: [birth year]  Age: 86 y.o. MRN: [id number]  CC: Tremulous/painful legs. HPI  This is an acute visit with symptoms of the above which occur always at night and wake her up.  She feels that both legs and throughout her legs are tremulous and somewhat uncomfortable/painful.  They are not swollen particularly. She denies a burning sensation in the legs. Past Medical History:  Diagnosis Date  . Anemia   . Arthritis   . Cerebral infarction ([place])   . Coronary artery disease   . Dementia ([place])   . Diabetes mellitus without complication (HCC)    Type 2  . Dysphagia due to recent cerebral infarction   . Failure to thrive (0-17)   . Hypercalcemia   . Hypertension   . Pneumonitis   . Protein calorie malnutrition ([place])   . Subdural hematoma ([place])   . Thyroid disease    History reviewed. No pertinent surgical history.   Family History  Family history unknown: Yes    Social History   Social History Narrative   Widow,husband passed away 2020,married for 22 years.Retired.   Social History   Tobacco Use  . Smoking status: Never Smoker  . Smokeless tobacco: Never Used  Substance Use Topics  . Alcohol use: No    Current Meds  Medication Sig  . acetaminophen (TYLENOL) 325 MG tablet Take 2 tablets (650 mg total) by mouth every 6 (six) hours as needed for mild pain or moderate pain.  Marland Kitchen amLODipine (NORVASC) 10 MG tablet Take 10 mg by mouth daily.   Marland Kitchen aspirin EC 81 MG tablet Take 81 mg by mouth daily.  Marland Kitchen atorvastatin (LIPITOR) 20 MG tablet Take 20 mg by mouth daily.   . cetirizine (ZYRTEC) 10 MG tablet Take 10 mg by mouth  at bedtime as needed for allergies.  . Cholecalciferol (VITAMIN D3) 125 MCG (5000 UT) TABS Take 5,000 Units by mouth 2 (two) times daily.   . Cholecalciferol (VITAMIN D3) 125 MCG (5000 UT) TABS Take 2 tablets (10,000 Units total) by mouth daily.  . famotidine (PEPCID) 20 MG tablet Take 20 mg by mouth daily.  . fluticasone (FLONASE) 50 MCG/ACT nasal spray Place 2 sprays into both nostrils daily.  . hydrALAZINE (APRESOLINE) 25 MG tablet Take 25 mg by mouth 3 (three) times daily. Give 1 tablet by mouth three times a day for HTN hold for systolic BP <474  . levothyroxine (SYNTHROID) 100 MCG tablet Take 1 tablet (100 mcg total) by mouth daily.  . magnesium oxide (MAG-OX) 400 MG tablet Take 400 mg by mouth daily.   . miconazole (MICOTIN) 2 % cream Apply 1 application topically 2 (two) times daily.  . Multiple Vitamin (MULTIVITAMIN WITH MINERALS) TABS tablet Take 1 tablet by mouth daily.   . tamsulosin (FLOMAX) 0.4 MG CAPS capsule Take 0.4 mg by mouth every evening.  . traZODone (DESYREL) 50 MG tablet Take 50 mg by mouth at bedtime.  Marland Kitchen trimethoprim (TRIMPEX) 100 MG tablet Take 1 tablet (100 mg total) by mouth daily.  . Wheat Dextrin (BENEFIBER PO) Take 10 mLs by mouth daily.  Depression screen [facility] 2/9 05/23/2019  Decreased Interest 0  Down, Depressed, Hopeless 0  PHQ - 2 Score 0     Objective:   Today's Vitals: BP (!) 144/68 (BP Location: Right Arm, Patient Position: Sitting, Cuff Size: Normal)   Pulse 66   Temp (!) 97.5 F (36.4 C) (Temporal)   Resp 19   Ht 5\' 5"  (1.651 m)   Wt 174 lb (78.9 kg)   SpO2 97%   BMI 28.96 kg/m  Vitals with BMI 10/30/2019 08/28/2019 05/23/2019  Height 5\' 5"  5\' 5"  5\' 5"   Weight 174 lbs 174 lbs 10 oz 176 lbs 3 oz  BMI 28.96 70.78 67.54  Systolic 492 010 071  Diastolic 68 70 68  Pulse 66 72 59     Physical Exam She looks frail but reasonably good for 86 years old.  There is no swelling of both her legs and no other major abnormalities that I can  detect.      Assessment   1. Restless leg syndrome       Tests ordered No orders of the defined types were placed in this encounter.    Plan: 1. Her clinical symptoms appear to be possibly of restless leg syndrome so I am going to prescribe for her a trial of Requip to see if this will help her.  If it does not help her, we may need to send her to neurology for further evaluation.   Meds ordered this encounter  Medications  . rOPINIRole (REQUIP) 0.25 MG tablet    Sig: Take 1 tablet (0.25 mg total) by mouth at bedtime.    Dispense:  30 tablet    Refill:  3    Rosalyn Archambault Luther Parody, MD

## 2019-11-06 ENCOUNTER — Ambulatory Visit (INDEPENDENT_AMBULATORY_CARE_PROVIDER_SITE_OTHER): Payer: Medicare Other | Admitting: Internal Medicine

## 2019-11-26 ENCOUNTER — Telehealth (INDEPENDENT_AMBULATORY_CARE_PROVIDER_SITE_OTHER): Payer: Self-pay

## 2019-11-26 NOTE — Telephone Encounter (Signed)
Okay thanks as long as there was no significant injuries.

## 2019-12-04 ENCOUNTER — Other Ambulatory Visit: Payer: Self-pay

## 2019-12-04 ENCOUNTER — Ambulatory Visit (INDEPENDENT_AMBULATORY_CARE_PROVIDER_SITE_OTHER): Payer: Medicare Other | Admitting: Internal Medicine

## 2019-12-04 ENCOUNTER — Encounter (INDEPENDENT_AMBULATORY_CARE_PROVIDER_SITE_OTHER): Payer: Self-pay | Admitting: Internal Medicine

## 2019-12-04 VITALS — BP 126/62 | HR 65 | Temp 97.2°F | Resp 19 | Ht 64.0 in | Wt 171.2 lb

## 2019-12-04 DIAGNOSIS — I10 Essential (primary) hypertension: Secondary | ICD-10-CM

## 2019-12-04 DIAGNOSIS — E119 Type 2 diabetes mellitus without complications: Secondary | ICD-10-CM | POA: Diagnosis not present

## 2019-12-04 DIAGNOSIS — E559 Vitamin D deficiency, unspecified: Secondary | ICD-10-CM

## 2019-12-04 DIAGNOSIS — G2581 Restless legs syndrome: Secondary | ICD-10-CM | POA: Diagnosis not present

## 2019-12-04 DIAGNOSIS — E039 Hypothyroidism, unspecified: Secondary | ICD-10-CM

## 2019-12-04 DIAGNOSIS — E782 Mixed hyperlipidemia: Secondary | ICD-10-CM | POA: Diagnosis not present

## 2019-12-04 NOTE — Progress Notes (Signed)
Metrics: Intervention Frequency ACO  Documented Smoking Status Yearly  Screened one or more times in 24 months  Cessation Counseling or  Active cessation medication Past 24 months  Past 24 months   Guideline developer: UpToDate (See UpToDate for funding source) Date Released: 2014       Wellness Office Visit  Subjective:  Patient ID: Regina Bush, female    DOB: [birth year]  Age: 86 y.o. MRN: [id number]  CC: This lady comes in for follow-up of diabetes, hypertension, hyperlipidemia, vitamin D deficiency and hypothyroidism. HPI  I had seen her for an acute visit with what appeared to be restless leg syndrome and she has done well with the medication that was prescribed. We also had increased her levothyroxine dose on the last visit and she has tolerated this higher dose. She continues on amlodipine for hypertension without any problems. She continues on atorvastatin for hyperlipidemia in the face of cerebrovascular disease. She continues on vitamin D3 10,000 units daily for vitamin D deficiency.  Past Medical History:  Diagnosis Date  . Anemia   . Arthritis   . Cerebral infarction ([place])   . Coronary artery disease   . Dementia ([place])   . Diabetes mellitus without complication (HCC)    Type 2  . Dysphagia due to recent cerebral infarction   . Failure to thrive (0-17)   . Hypercalcemia   . Hypertension   . Pneumonitis   . Protein calorie malnutrition ([place])   . Subdural hematoma ([place])   . Thyroid disease    History reviewed. No pertinent surgical history.   Family History  Family history unknown: Yes    Social History   Social History Narrative   Widow,husband passed away 2020,married for 46 years.Retired.   Social History   Tobacco Use  . Smoking status: Never Smoker  . Smokeless tobacco: Never Used  Substance Use Topics  . Alcohol use: No    Current Meds  Medication Sig  . acetaminophen (TYLENOL) 325 MG tablet Take 2 tablets (650 mg total) by mouth every 6  (six) hours as needed for mild pain or moderate pain.  Marland Kitchen amLODipine (NORVASC) 10 MG tablet Take 10 mg by mouth daily.   Marland Kitchen aspirin EC 81 MG tablet Take 81 mg by mouth daily.  Marland Kitchen atorvastatin (LIPITOR) 20 MG tablet Take 20 mg by mouth daily.   . cetirizine (ZYRTEC) 10 MG tablet Take 10 mg by mouth at bedtime as needed for allergies.  . Cholecalciferol (VITAMIN D3) 125 MCG (5000 UT) TABS Take 5,000 Units by mouth 2 (two) times daily.   . Cholecalciferol (VITAMIN D3) 125 MCG (5000 UT) TABS Take 2 tablets (10,000 Units total) by mouth daily.  . famotidine (PEPCID) 20 MG tablet Take 20 mg by mouth daily.  . fluticasone (FLONASE) 50 MCG/ACT nasal spray Place 2 sprays into both nostrils daily.  . hydrALAZINE (APRESOLINE) 25 MG tablet Take 25 mg by mouth 3 (three) times daily. Give 1 tablet by mouth three times a day for HTN hold for systolic BP <426  . levothyroxine (SYNTHROID) 100 MCG tablet Take 1 tablet (100 mcg total) by mouth daily.  . magnesium oxide (MAG-OX) 400 MG tablet Take 400 mg by mouth daily.   . miconazole (MICOTIN) 2 % cream Apply 1 application topically 2 (two) times daily.  . Multiple Vitamin (MULTIVITAMIN WITH MINERALS) TABS tablet Take 1 tablet by mouth daily.   Marland Kitchen rOPINIRole (REQUIP) 0.25 MG tablet Take 1 tablet (0.25 mg total) by mouth at bedtime.  Marland Kitchen  tamsulosin (FLOMAX) 0.4 MG CAPS capsule Take 0.4 mg by mouth every evening.  . traZODone (DESYREL) 50 MG tablet Take 50 mg by mouth at bedtime.  Marland Kitchen trimethoprim (TRIMPEX) 100 MG tablet Take 1 tablet (100 mg total) by mouth daily.  . Wheat Dextrin (BENEFIBER PO) Take 10 mLs by mouth daily.       Depression screen [facility] 2/9 05/23/2019  Decreased Interest 0  Down, Depressed, Hopeless 0  PHQ - 2 Score 0     Objective:   Today's Vitals: BP 126/62 (BP Location: Left Arm, Patient Position: Sitting, Cuff Size: Normal)   Pulse 65   Temp (!) 97.2 F (36.2 C) (Temporal)   Resp 19   Ht 5\' 4"  (1.626 m)   Wt 171 lb 3.2 oz (77.7 kg)    SpO2 97%   BMI 29.39 kg/m  Vitals with BMI 12/04/2019 10/30/2019 08/28/2019  Height 5\' 4"  5\' 5"  5\' 5"   Weight 171 lbs 3 oz 174 lbs 174 lbs 10 oz  BMI 29.37 14.97 02.63  Systolic 785 885 027  Diastolic 62 68 70  Pulse 65 66 72     Physical Exam   She looks good for 86 years old. She has lost about 3 pounds since last visit. Blood pressure is excellent. She is alert and orientated.    Assessment   1. Essential hypertension   2. Acquired hypothyroidism   3. Diabetes mellitus without complication ([place])   4. Vitamin D deficiency disease   5. Restless leg syndrome   6. Mixed hyperlipidemia       Tests ordered Orders Placed This Encounter  Procedures  . COMPLETE METABOLIC PANEL WITH GFR  . Hemoglobin A1c  . Lipid panel  . T3, free  . T4  . TSH  . VITAMIN D 25 Hydroxy (Vit-D Deficiency, Fractures)     Plan: 1. She will continue with amlodipine for uncontrolled hypertension. 2. She will continue with levothyroxine and we will check thyroid function test today. 3. She will continue with atorvastatin and I will check lipid panel today. 4. Her diabetes is mostly diet controlled and her last hemoglobin A1c was 7.5% which is not too bad for 86 year old. We will check it again today. 5. Further recommendations will depend on blood results and I will see her in about 4 months time for follow-up.   No orders of the defined types were placed in this encounter.   Doree Albee, MD

## 2019-12-05 ENCOUNTER — Other Ambulatory Visit (INDEPENDENT_AMBULATORY_CARE_PROVIDER_SITE_OTHER): Payer: Self-pay | Admitting: Internal Medicine

## 2019-12-05 ENCOUNTER — Encounter (INDEPENDENT_AMBULATORY_CARE_PROVIDER_SITE_OTHER): Payer: Self-pay

## 2019-12-05 DIAGNOSIS — N184 Chronic kidney disease, stage 4 (severe): Secondary | ICD-10-CM

## 2019-12-05 LAB — LIPID PANEL
Cholesterol: 142 mg/dL (ref <200)
HDL: 34 mg/dL — ABNORMAL LOW (ref >=50)
LDL Cholesterol (Calc): 74 mg/dL (calc)
Non-HDL Cholesterol (Calc): 108 mg/dL (calc) (ref <130)
Total CHOL/HDL Ratio: 4.2 (calc) (ref <5.0)
Triglycerides: 243 mg/dL — ABNORMAL HIGH (ref <150)

## 2019-12-05 LAB — COMPLETE METABOLIC PANEL WITH GFR
AG Ratio: 1.4 (calc) (ref 1.0–2.5)
ALT: 9 U/L (ref 6–29)
AST: 11 U/L (ref 10–35)
Albumin: 3.3 g/dL — ABNORMAL LOW (ref 3.6–5.1)
Alkaline phosphatase (APISO): 67 U/L (ref 37–153)
BUN/Creatinine Ratio: 15 (calc) (ref 6–22)
BUN: 41 mg/dL — ABNORMAL HIGH (ref 7–25)
CO2: 22 mmol/L (ref 20–32)
Calcium: 9 mg/dL (ref 8.6–10.4)
Chloride: 109 mmol/L (ref 98–110)
Creat: 2.82 mg/dL — ABNORMAL HIGH (ref 0.60–0.88)
GFR, Est African American: 16 mL/min/{1.73_m2} — ABNORMAL LOW (ref >=60)
GFR, Est Non African American: 14 mL/min/{1.73_m2} — ABNORMAL LOW (ref >=60)
Globulin: 2.3 g/dL (calc) (ref 1.9–3.7)
Glucose, Bld: 148 mg/dL — ABNORMAL HIGH (ref 65–99)
Potassium: 5.4 mmol/L — ABNORMAL HIGH (ref 3.5–5.3)
Sodium: 139 mmol/L (ref 135–146)
Total Bilirubin: 0.3 mg/dL (ref 0.2–1.2)
Total Protein: 5.6 g/dL — ABNORMAL LOW (ref 6.1–8.1)

## 2019-12-05 LAB — VITAMIN D 25 HYDROXY (VIT D DEFICIENCY, FRACTURES): Vit D, 25-Hydroxy: 56 ng/mL (ref 30–100)

## 2019-12-05 LAB — HEMOGLOBIN A1C
Hgb A1c MFr Bld: 7.5 % of total Hgb — ABNORMAL HIGH (ref <5.7)
Mean Plasma Glucose: 169 (calc)
eAG (mmol/L): 9.3 (calc)

## 2019-12-05 LAB — T4: T4, Total: 9.1 ug/dL (ref 5.1–11.9)

## 2019-12-05 LAB — T3, FREE: T3, Free: 2.4 pg/mL (ref 2.3–4.2)

## 2019-12-05 LAB — TSH: TSH: 8.25 mIU/L — ABNORMAL HIGH (ref 0.40–4.50)

## 2019-12-05 MED ORDER — NP THYROID 90 MG PO TABS: 90.0000 mg | ORAL_TABLET | Freq: Every day | ORAL | 3 refills | Status: AC

## 2019-12-05 NOTE — Progress Notes (Signed)
Called and sending a copy of results to [facility] facility for her chart.

## 2019-12-05 NOTE — Progress Notes (Signed)
Faxed over results & order also. Called POA to give the results also.

## 2019-12-05 NOTE — Progress Notes (Signed)
Please call the patient place of residence.  She needs to stop levothyroxine and I have sent a new prescription of NP thyroid 90 mg tablet, take 1 daily.  I have sent this to her pharmacy listed.Also, her kidney function is getting worse and I have made an order for referral to Dr. Theador Hawthorne, nephrology.  Please schedule.Follow-up is scheduled.  Thanks.

## 2019-12-11 DIAGNOSIS — M79675 Pain in left toe(s): Secondary | ICD-10-CM | POA: Diagnosis not present

## 2019-12-11 DIAGNOSIS — B351 Tinea unguium: Secondary | ICD-10-CM | POA: Diagnosis not present

## 2019-12-11 DIAGNOSIS — M79674 Pain in right toe(s): Secondary | ICD-10-CM | POA: Diagnosis not present

## 2019-12-12 DIAGNOSIS — Z23 Encounter for immunization: Secondary | ICD-10-CM | POA: Diagnosis not present

## 2019-12-24 ENCOUNTER — Other Ambulatory Visit (INDEPENDENT_AMBULATORY_CARE_PROVIDER_SITE_OTHER): Payer: Self-pay | Admitting: Internal Medicine

## 2019-12-24 ENCOUNTER — Telehealth (INDEPENDENT_AMBULATORY_CARE_PROVIDER_SITE_OTHER): Payer: Self-pay

## 2019-12-24 MED ORDER — ROPINIROLE HCL 0.5 MG PO TABS: 0.5000 mg | ORAL_TABLET | Freq: Every day | ORAL | 3 refills | Status: DC

## 2019-12-24 NOTE — Telephone Encounter (Signed)
Called [name] and gave her the message from Dr. Anastasio Champion on instructions. [name] verbalized an understanding and will reach out to schedule patient if no improvement.

## 2019-12-24 NOTE — Telephone Encounter (Signed)
Let [name] know that I have sent a higher dose of the Requip 0.5 mg taken at night to see if this will help her.  If it does not, after about 2 to 3 weeks time, she needs to bring the patient in so I or Judson Roch can examine her.

## 2019-12-24 NOTE — Telephone Encounter (Signed)
[name] from [facility] called and left a VM and stated that patient is still taking Requip 0.25mg  at bedtime and patient is still complaining of discomfort. [name] thinks the patient may have some neuropathy and would like to discuss at [phone] ext. [phone] and [name] stated that they use the [facility] in [place] if you can send something in.  Please advise.

## 2020-01-06 IMAGING — CR DG CHEST 1V PORT
1 series · 1 of 1 positions shown · non-contrast
Comparison: 02/03/2018

CLINICAL DATA: Pneumonia, shortness of breath, fever

EXAM:
PORTABLE CHEST 1 VIEW

[portable]
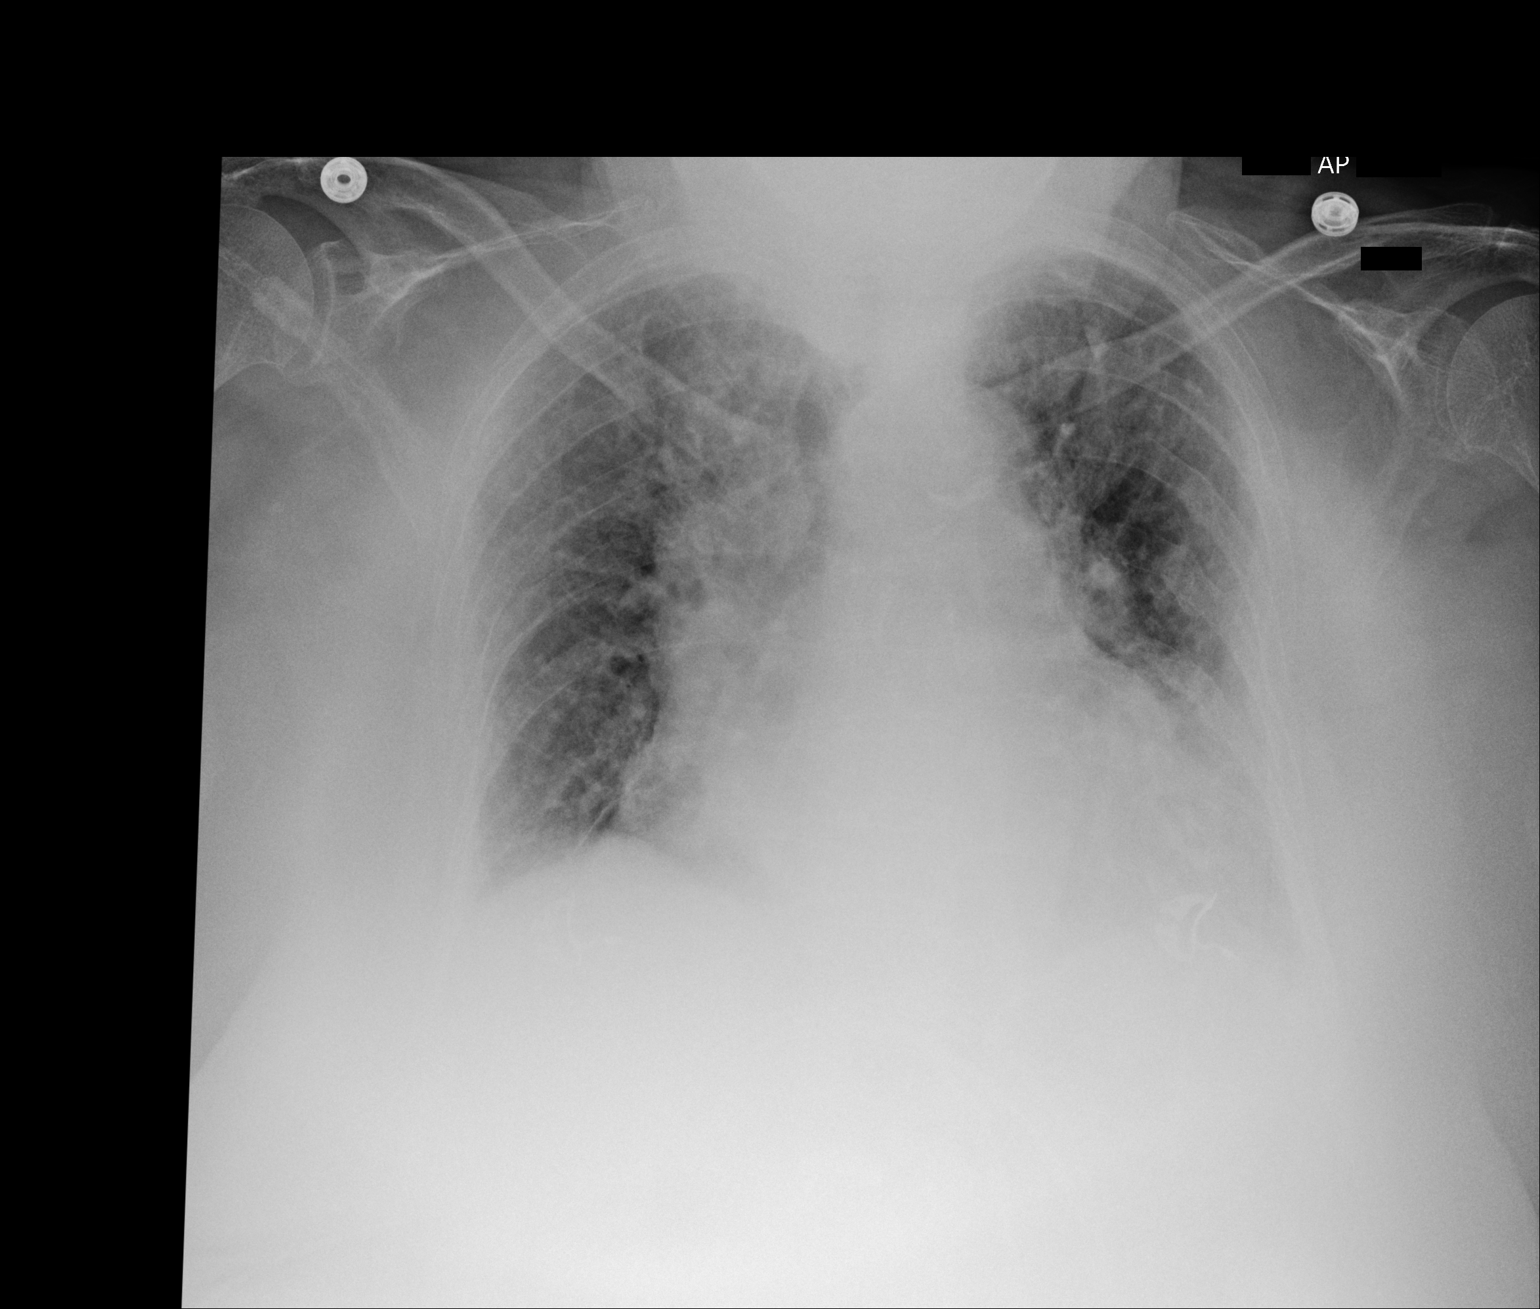

[1 of 1 positions shown; findings below may reference images not displayed]

FINDINGS: Cardiomegaly with vascular congestion and mild bilateral
interstitial and alveolar opacities, similar prior study most
compatible with CHF. Possible small effusions. No acute bony
abnormality. Low lung volumes.
IMPRESSION: Continued mild CHF, unchanged.

Question small effusions.

## 2020-01-09 ENCOUNTER — Other Ambulatory Visit (INDEPENDENT_AMBULATORY_CARE_PROVIDER_SITE_OTHER): Payer: Self-pay | Admitting: Internal Medicine

## 2020-01-09 ENCOUNTER — Telehealth (INDEPENDENT_AMBULATORY_CARE_PROVIDER_SITE_OTHER): Payer: Self-pay

## 2020-01-09 DIAGNOSIS — N3000 Acute cystitis without hematuria: Secondary | ICD-10-CM

## 2020-01-09 NOTE — Telephone Encounter (Signed)
Order has been faxed to [facility] at [phone] and we did receive a confirmation that this fax went through.

## 2020-01-09 NOTE — Telephone Encounter (Signed)
Error

## 2020-01-09 NOTE — Telephone Encounter (Signed)
Michelle from [facility] in [place] called and stated that the patient is having burning with urination and they would like to receive a fax with orders to do a UA/culture? Fax to [phone]

## 2020-01-09 NOTE — Telephone Encounter (Signed)
Okay 

## 2020-01-11 ENCOUNTER — Other Ambulatory Visit (HOSPITAL_COMMUNITY)
Admission: AD | Admit: 2020-01-11 | Discharge: 2020-01-11 | Disposition: A | Discharge status: Home / Self Care | Payer: Medicare Other | Source: Skilled Nursing Facility | Attending: Internal Medicine | Admitting: Internal Medicine

## 2020-01-11 DIAGNOSIS — N390 Urinary tract infection, site not specified: Secondary | ICD-10-CM | POA: Diagnosis not present

## 2020-01-11 LAB — URINALYSIS, ROUTINE W REFLEX MICROSCOPIC
Bilirubin Urine: NEGATIVE
Glucose, UA: >=500 mg/dL — AB
Hgb urine dipstick: NEGATIVE
Ketones, ur: NEGATIVE mg/dL
Leukocytes,Ua: NEGATIVE
Nitrite: NEGATIVE
Protein, ur: >=300 mg/dL — AB
Specific Gravity, Urine: 1.018 (ref 1.005–1.030)
pH: 5 (ref 5.0–8.0)

## 2020-01-14 ENCOUNTER — Other Ambulatory Visit (INDEPENDENT_AMBULATORY_CARE_PROVIDER_SITE_OTHER): Payer: Self-pay | Admitting: Nurse Practitioner

## 2020-01-14 DIAGNOSIS — N3000 Acute cystitis without hematuria: Secondary | ICD-10-CM

## 2020-01-14 MED ORDER — CEFUROXIME AXETIL 250 MG PO TABS: 250.0000 mg | ORAL_TABLET | Freq: Every day | ORAL | 0 refills | Status: DC

## 2020-01-14 NOTE — Progress Notes (Signed)
Discussed this patient's urine culture and sensitivity with Dr. Anastasio Champion over the phone this afternoon. He recommends prescribing ceftin 250mg  daily x 3 days. Will send this to her pharmacy.

## 2020-01-15 LAB — URINE CULTURE: Culture: 20000 — AB

## 2020-01-23 DIAGNOSIS — E113513 Type 2 diabetes mellitus with proliferative diabetic retinopathy with macular edema, bilateral: Secondary | ICD-10-CM | POA: Diagnosis not present

## 2020-01-31 ENCOUNTER — Telehealth (INDEPENDENT_AMBULATORY_CARE_PROVIDER_SITE_OTHER): Payer: Self-pay

## 2020-01-31 NOTE — Telephone Encounter (Signed)
If the CBG is above 300 when it is checked next time, she must go to the emergency room.

## 2020-01-31 NOTE — Telephone Encounter (Signed)
Talk with med tech on duty today at 3 pm. Given the verbal message if CBG go above 300. To send patient to the ER ASAP.

## 2020-01-31 NOTE — Telephone Encounter (Signed)
[facility] STAFF CALLED ON 01/30/2020 AFTER BUSINESS HOURS. STATED PT WAS HAVING A CBG OF [place]EL[place]. NO LEVEL WAS LEFT ON VOIVEMAIL. CALLED BACK TODAY11/18; TALK WITH A NURSE STAFF AND STATED TODAY THE LEVEL LAST NIGHT WAS 500. PT WAS NOT SENT OUT TO ER OR ANYTHING.  TODAY, 135 THIS MORNING WAS THE NEXT CBG WILL BE TAKE THIS EVENING. PLEAE ADVISE WHAT TO DO AT THIS POINT?

## 2020-02-05 ENCOUNTER — Other Ambulatory Visit: Payer: Self-pay

## 2020-02-05 ENCOUNTER — Encounter (HOSPITAL_COMMUNITY): Payer: Self-pay

## 2020-02-05 ENCOUNTER — Inpatient Hospital Stay (HOSPITAL_COMMUNITY)
Admission: EM | Admit: 2020-02-05 | Discharge: 2020-02-12 | DRG: 682 | Disposition: A | Discharge status: Home / Self Care | Payer: Medicare Other | Attending: Family Medicine | Admitting: Family Medicine

## 2020-02-05 ENCOUNTER — Telehealth (INDEPENDENT_AMBULATORY_CARE_PROVIDER_SITE_OTHER): Payer: Self-pay

## 2020-02-05 ENCOUNTER — Emergency Department (HOSPITAL_COMMUNITY): Payer: Medicare Other

## 2020-02-05 DIAGNOSIS — N184 Chronic kidney disease, stage 4 (severe): Secondary | ICD-10-CM | POA: Diagnosis present

## 2020-02-05 DIAGNOSIS — E43 Unspecified severe protein-calorie malnutrition: Secondary | ICD-10-CM | POA: Diagnosis present

## 2020-02-05 DIAGNOSIS — I129 Hypertensive chronic kidney disease with stage 1 through stage 4 chronic kidney disease, or unspecified chronic kidney disease: Secondary | ICD-10-CM | POA: Diagnosis present

## 2020-02-05 DIAGNOSIS — M1990 Unspecified osteoarthritis, unspecified site: Secondary | ICD-10-CM | POA: Diagnosis present

## 2020-02-05 DIAGNOSIS — J189 Pneumonia, unspecified organism: Secondary | ICD-10-CM | POA: Diagnosis present

## 2020-02-05 DIAGNOSIS — J40 Bronchitis, not specified as acute or chronic: Secondary | ICD-10-CM | POA: Diagnosis present

## 2020-02-05 DIAGNOSIS — R627 Adult failure to thrive: Secondary | ICD-10-CM | POA: Diagnosis present

## 2020-02-05 DIAGNOSIS — T380X5A Adverse effect of glucocorticoids and synthetic analogues, initial encounter: Secondary | ICD-10-CM | POA: Diagnosis present

## 2020-02-05 DIAGNOSIS — F0390 Unspecified dementia without behavioral disturbance: Secondary | ICD-10-CM | POA: Diagnosis present

## 2020-02-05 DIAGNOSIS — Z79890 Hormone replacement therapy (postmenopausal): Secondary | ICD-10-CM

## 2020-02-05 DIAGNOSIS — N179 Acute kidney failure, unspecified: Principal | ICD-10-CM | POA: Diagnosis present

## 2020-02-05 DIAGNOSIS — J90 Pleural effusion, not elsewhere classified: Secondary | ICD-10-CM | POA: Diagnosis not present

## 2020-02-05 DIAGNOSIS — D539 Nutritional anemia, unspecified: Secondary | ICD-10-CM | POA: Diagnosis present

## 2020-02-05 DIAGNOSIS — Z6824 Body mass index (BMI) 24.0-24.9, adult: Secondary | ICD-10-CM

## 2020-02-05 DIAGNOSIS — Z79899 Other long term (current) drug therapy: Secondary | ICD-10-CM

## 2020-02-05 DIAGNOSIS — E039 Hypothyroidism, unspecified: Secondary | ICD-10-CM | POA: Diagnosis present

## 2020-02-05 DIAGNOSIS — E875 Hyperkalemia: Secondary | ICD-10-CM | POA: Diagnosis present

## 2020-02-05 DIAGNOSIS — J9690 Respiratory failure, unspecified, unspecified whether with hypoxia or hypercapnia: Secondary | ICD-10-CM | POA: Diagnosis not present

## 2020-02-05 DIAGNOSIS — I700 Atherosclerosis of aorta: Secondary | ICD-10-CM | POA: Diagnosis not present

## 2020-02-05 DIAGNOSIS — I2510 Atherosclerotic heart disease of native coronary artery without angina pectoris: Secondary | ICD-10-CM | POA: Diagnosis present

## 2020-02-05 DIAGNOSIS — J811 Chronic pulmonary edema: Secondary | ICD-10-CM | POA: Diagnosis present

## 2020-02-05 DIAGNOSIS — I69391 Dysphagia following cerebral infarction: Secondary | ICD-10-CM | POA: Diagnosis not present

## 2020-02-05 DIAGNOSIS — R0602 Shortness of breath: Secondary | ICD-10-CM | POA: Diagnosis not present

## 2020-02-05 DIAGNOSIS — E1165 Type 2 diabetes mellitus with hyperglycemia: Secondary | ICD-10-CM | POA: Diagnosis present

## 2020-02-05 DIAGNOSIS — Z20822 Contact with and (suspected) exposure to covid-19: Secondary | ICD-10-CM | POA: Diagnosis present

## 2020-02-05 DIAGNOSIS — Z91048 Other nonmedicinal substance allergy status: Secondary | ICD-10-CM

## 2020-02-05 DIAGNOSIS — J849 Interstitial pulmonary disease, unspecified: Secondary | ICD-10-CM | POA: Diagnosis present

## 2020-02-05 DIAGNOSIS — D631 Anemia in chronic kidney disease: Secondary | ICD-10-CM | POA: Diagnosis present

## 2020-02-05 DIAGNOSIS — Z7982 Long term (current) use of aspirin: Secondary | ICD-10-CM | POA: Diagnosis not present

## 2020-02-05 DIAGNOSIS — I10 Essential (primary) hypertension: Secondary | ICD-10-CM | POA: Diagnosis not present

## 2020-02-05 DIAGNOSIS — J441 Chronic obstructive pulmonary disease with (acute) exacerbation: Secondary | ICD-10-CM | POA: Diagnosis not present

## 2020-02-05 DIAGNOSIS — Z888 Allergy status to other drugs, medicaments and biological substances status: Secondary | ICD-10-CM

## 2020-02-05 DIAGNOSIS — Z885 Allergy status to narcotic agent status: Secondary | ICD-10-CM

## 2020-02-05 DIAGNOSIS — N281 Cyst of kidney, acquired: Secondary | ICD-10-CM | POA: Diagnosis not present

## 2020-02-05 DIAGNOSIS — E1122 Type 2 diabetes mellitus with diabetic chronic kidney disease: Secondary | ICD-10-CM | POA: Diagnosis present

## 2020-02-05 DIAGNOSIS — J9601 Acute respiratory failure with hypoxia: Secondary | ICD-10-CM | POA: Diagnosis present

## 2020-02-05 LAB — CBC WITH DIFFERENTIAL/PLATELET
Abs Immature Granulocytes: 0.09 10*3/uL — ABNORMAL HIGH (ref 0.00–0.07)
Basophils Absolute: 0 10*3/uL (ref 0.0–0.1)
Basophils Relative: 0 %
Eosinophils Absolute: 0.5 10*3/uL (ref 0.0–0.5)
Eosinophils Relative: 5 %
HCT: 32.9 % — ABNORMAL LOW (ref 36.0–46.0)
Hemoglobin: 9.8 g/dL — ABNORMAL LOW (ref 12.0–15.0)
Immature Granulocytes: 1 %
Lymphocytes Relative: 12 %
Lymphs Abs: 1.1 10*3/uL (ref 0.7–4.0)
MCH: 31.2 pg (ref 26.0–34.0)
MCHC: 29.8 g/dL — ABNORMAL LOW (ref 30.0–36.0)
MCV: 104.8 fL — ABNORMAL HIGH (ref 80.0–100.0)
Monocytes Absolute: 0.9 10*3/uL (ref 0.1–1.0)
Monocytes Relative: 9 %
Neutro Abs: 7.1 10*3/uL (ref 1.7–7.7)
Neutrophils Relative %: 73 %
Platelets: 255 10*3/uL (ref 150–400)
RBC: 3.14 MIL/uL — ABNORMAL LOW (ref 3.87–5.11)
RDW: 13.2 % (ref 11.5–15.5)
WBC: 9.6 10*3/uL (ref 4.0–10.5)
nRBC: 0 % (ref 0.0–0.2)

## 2020-02-05 LAB — BASIC METABOLIC PANEL
Anion gap: 8 (ref 5–15)
BUN: 36 mg/dL — ABNORMAL HIGH (ref 8–23)
CO2: 20 mmol/L — ABNORMAL LOW (ref 22–32)
Calcium: 8.6 mg/dL — ABNORMAL LOW (ref 8.9–10.3)
Chloride: 114 mmol/L — ABNORMAL HIGH (ref 98–111)
Creatinine, Ser: 3.18 mg/dL — ABNORMAL HIGH (ref 0.44–1.00)
GFR, Estimated: 13 mL/min — ABNORMAL LOW (ref >60)
Glucose, Bld: 166 mg/dL — ABNORMAL HIGH (ref 70–99)
Potassium: 6.6 mmol/L (ref 3.5–5.1)
Sodium: 142 mmol/L (ref 135–145)

## 2020-02-05 LAB — POTASSIUM: Potassium: 5.8 mmol/L — ABNORMAL HIGH (ref 3.5–5.1)

## 2020-02-05 LAB — IRON AND TIBC
Iron: 25 ug/dL — ABNORMAL LOW (ref 28–170)
Saturation Ratios: 10 % — ABNORMAL LOW (ref 10.4–31.8)
TIBC: 245 ug/dL — ABNORMAL LOW (ref 250–450)
UIBC: 220 ug/dL

## 2020-02-05 LAB — RETICULOCYTES
Immature Retic Fract: 15.5 % (ref 2.3–15.9)
RBC.: 3.11 MIL/uL — ABNORMAL LOW (ref 3.87–5.11)
Retic Count, Absolute: 71.8 10*3/uL (ref 19.0–186.0)
Retic Ct Pct: 2.3 % (ref 0.4–3.1)

## 2020-02-05 LAB — RESP PANEL BY RT-PCR (FLU A&B, COVID) ARPGX2
Influenza A by PCR: NEGATIVE
Influenza B by PCR: NEGATIVE
SARS Coronavirus 2 by RT PCR: NEGATIVE

## 2020-02-05 LAB — BRAIN NATRIURETIC PEPTIDE: B Natriuretic Peptide: 227 pg/mL — ABNORMAL HIGH (ref 0.0–100.0)

## 2020-02-05 LAB — FERRITIN: Ferritin: 35 ng/mL (ref 11–307)

## 2020-02-05 LAB — VITAMIN B12: Vitamin B-12: 175 pg/mL — ABNORMAL LOW (ref 180–914)

## 2020-02-05 LAB — TROPONIN I (HIGH SENSITIVITY): Troponin I (High Sensitivity): 8 ng/L (ref <18)

## 2020-02-05 LAB — FOLATE: Folate: 39.3 ng/mL (ref >5.9)

## 2020-02-05 MED ORDER — SODIUM CHLORIDE 0.9 % IV BOLUS
500.0000 mL | Freq: Once | INTRAVENOUS | Status: AC
  Administered 2020-02-05: 500 mL via INTRAVENOUS

## 2020-02-05 MED ORDER — SODIUM CHLORIDE 0.9 % IV SOLN
500.0000 mg | Freq: Once | INTRAVENOUS | Status: AC
  Administered 2020-02-05: 500 mg via INTRAVENOUS
  Filled 2020-02-05: qty 500

## 2020-02-05 MED ORDER — ALBUTEROL SULFATE HFA 108 (90 BASE) MCG/ACT IN AERS
2.0000 | INHALATION_SPRAY | Freq: Once | RESPIRATORY_TRACT | Status: AC
  Administered 2020-02-05: 2 via RESPIRATORY_TRACT
  Filled 2020-02-05: qty 6.7

## 2020-02-05 MED ORDER — INSULIN ASPART 100 UNIT/ML IV SOLN
5.0000 [IU] | Freq: Once | INTRAVENOUS | Status: AC
  Administered 2020-02-05: 5 [IU] via INTRAVENOUS

## 2020-02-05 MED ORDER — DEXTROSE 50 % IV SOLN
1.0000 | Freq: Once | INTRAVENOUS | Status: AC
  Administered 2020-02-05: 50 mL via INTRAVENOUS
  Filled 2020-02-05: qty 50

## 2020-02-05 MED ORDER — SODIUM CHLORIDE 0.9 % IV SOLN
1.0000 g | Freq: Once | INTRAVENOUS | Status: AC
  Administered 2020-02-05: 1 g via INTRAVENOUS
  Filled 2020-02-05: qty 10

## 2020-02-05 NOTE — Telephone Encounter (Signed)
[name] called from [facility]. Stated on voicemail, Mrs Regina Bush is having difficulty breathing and refused to go to the ER.  Asked the nurse to retake the O2 stat now, currently showing 91%. So Dr Anastasio Champion would like her to go to [facility] ER dept now by EMS.

## 2020-02-05 NOTE — ED Notes (Signed)
Attempted report x1. 

## 2020-02-05 NOTE — ED Provider Notes (Signed)
[facility] Provider Note   CSN: [id number] Arrival date & time: 02/05/20  1424     History Chief Complaint  Patient presents with  . Shortness of Breath    Selma Mink is a 86 y.o. female.  86 yo F with a chief complaint of shortness of breath.  This is been off and on for some time.  Usually worse when she tries to exert herself.  Not having any trouble breathing currently.  Has had some coughing with this.  No fevers.  No chest pain or pressure.  No nausea vomiting or diarrhea.  No sick contacts.  She tells me that she did not want to come but they made her.  Denies any lower extremity edema.  The history is provided by the patient.  Shortness of Breath Severity:  Moderate Onset quality:  Gradual Duration:  1 month Timing:  Intermittent Progression:  Waxing and waning Chronicity:  New Relieved by:  Nothing Worsened by:  Exertion Ineffective treatments:  None tried Associated symptoms: cough   Associated symptoms: no chest pain, no fever, no headaches, no vomiting and no wheezing        Past Medical History:  Diagnosis Date  . Anemia   . Arthritis   . Cerebral infarction ([place])   . Coronary artery disease   . Dementia ([place])   . Diabetes mellitus without complication (HCC)    Type 2  . Dysphagia due to recent cerebral infarction   . Failure to thrive (0-17)   . Hypercalcemia   . Hypertension   . Pneumonitis   . Protein calorie malnutrition ([place])   . Subdural hematoma ([place])   . Thyroid disease     Patient Active Problem List   Diagnosis Date Noted  . Weakness 04/19/2019  . DNR (do not resuscitate) 03/21/2019  . Goals of care, counseling/discussion   . Palliative care by specialist   . Dyspnea   . Hyperkalemia   . Hypoxia   . Shortness of breath   . Acute respiratory failure with hypoxia ([place]) 03/19/2019  . Pneumonia 03/19/2019  . AKI (acute kidney injury) ([place]) 03/18/2019  . Urinary retention 02/20/2018  . Hypothyroidism  11/19/2016  . History of CVA in adulthood 11/19/2016  . UTI (urinary tract infection) 11/15/2016  . ARF (acute renal failure) ([place]) 11/15/2016  . Hyponatremia 11/15/2016  . HCAP (healthcare-associated pneumonia) 08/19/2016  . Subdural hematoma ([place])   . Hypertension   . Diabetes mellitus without complication ([place])   . Coronary artery disease     History reviewed. No pertinent surgical history.   OB History   No obstetric history on file.     Family History  Family history unknown: Yes    Social History   Tobacco Use  . Smoking status: Never Smoker  . Smokeless tobacco: Never Used  Vaping Use  . Vaping Use: Never used  Substance Use Topics  . Alcohol use: No  . Drug use: No    Home Medications Prior to Admission medications   Medication Sig Start Date End Date Taking? Authorizing Provider  acetaminophen (TYLENOL) 325 MG tablet Take 2 tablets (650 mg total) by mouth every 6 (six) hours as needed for mild pain or moderate pain. 08/25/16  Yes Rosita Fire, MD  amLODipine (NORVASC) 10 MG tablet Take 10 mg by mouth daily.    Yes [provider]  aspirin EC 81 MG tablet Take 81 mg by mouth daily.   Yes [provider]  atorvastatin (LIPITOR)  20 MG tablet Take 20 mg by mouth daily.    Yes [provider]  cetirizine (ZYRTEC) 10 MG tablet Take 10 mg by mouth at bedtime as needed for allergies.   Yes [provider]  Cholecalciferol (VITAMIN D3) 125 MCG (5000 UT) TABS Take 2 tablets (10,000 Units total) by mouth daily. 05/24/19  Yes Gosrani, Nimish C, MD  famotidine (PEPCID) 20 MG tablet Take 20 mg by mouth daily.   Yes [provider]  fluticasone (FLONASE) 50 MCG/ACT nasal spray Place 2 sprays into both nostrils daily.   Yes [provider]  hydrALAZINE (APRESOLINE) 25 MG tablet Take 25 mg by mouth 3 (three) times daily. Give 1 tablet by mouth three times a day for HTN hold for systolic BP <562   Yes [provider]    levothyroxine (SYNTHROID) 100 MCG tablet Take 90 mcg by mouth daily before breakfast.   Yes [provider]  magnesium oxide (MAG-OX) 400 MG tablet Take 400 mg by mouth daily.    Yes [provider]  Multiple Vitamin (MULTIVITAMIN WITH MINERALS) TABS tablet Take 1 tablet by mouth daily.    Yes [provider]  rOPINIRole (REQUIP) 0.5 MG tablet Take 1 tablet (0.5 mg total) by mouth at bedtime. 12/24/19  Yes Gosrani, Nimish C, MD  tamsulosin (FLOMAX) 0.4 MG CAPS capsule Take 0.4 mg by mouth every evening.   Yes [provider]  traZODone (DESYREL) 50 MG tablet Take 50 mg by mouth at bedtime.   Yes [provider]  trimethoprim (TRIMPEX) 100 MG tablet Take 1 tablet (100 mg total) by mouth daily. 03/26/19  Yes Johnson, Clanford L, MD  Wheat Dextrin (BENEFIBER PO) Take 10 mLs by mouth daily.    Yes [provider]  cefUROXime (CEFTIN) 250 MG tablet Take 1 tablet (250 mg total) by mouth daily. 01/14/20   Ailene Ards, NP  ibuprofen (ADVIL) 800 MG tablet Take 800 mg by mouth 3 (three) times daily. 11/27/19   [provider]  miconazole (MICOTIN) 2 % cream Apply 1 application topically 2 (two) times daily.    [provider]  NP THYROID 90 MG tablet Take 1 tablet (90 mg total) by mouth daily. Patient not taking: Reported on 02/05/2020 12/05/19   Doree Albee, MD  QUEtiapine (SEROQUEL) 100 MG tablet Take 100 mg by mouth at bedtime. 11/27/19   [provider]    Allergies    Cymbalta [duloxetine hcl], Effexor [venlafaxine], Morphine and related, and Tape  Review of Systems   Review of Systems  Constitutional: Negative for chills and fever.  HENT: Negative for congestion and rhinorrhea.   Eyes: Negative for redness and visual disturbance.  Respiratory: Positive for cough and shortness of breath. Negative for wheezing.   Cardiovascular: Negative for chest pain and palpitations.  Gastrointestinal: Negative for nausea and  vomiting.  Genitourinary: Negative for dysuria and urgency.  Musculoskeletal: Negative for arthralgias and myalgias.  Skin: Negative for pallor and wound.  Neurological: Negative for dizziness and headaches.    Physical Exam Updated Vital Signs BP 126/65   Pulse 65   Temp 98.1 F (36.7 C) (Oral)   Resp (!) 31   Ht 5\' 3"  (1.6 m)   Wt 63.5 kg   SpO2 96%   BMI 24.80 kg/m   Physical Exam Vitals and nursing note reviewed.  Constitutional:      General: She is not in acute distress.    Appearance: She is well-developed. She is  not diaphoretic.  HENT:     Head: Normocephalic and atraumatic.  Eyes:     Pupils: Pupils are equal, round, and reactive to light.  Cardiovascular:     Rate and Rhythm: Normal rate and regular rhythm.     Heart sounds: No murmur heard.  No friction rub. No gallop.   Pulmonary:     Effort: Pulmonary effort is normal.     Breath sounds: Rhonchi (diffuse, worst to the left lung fields) present. No wheezing or rales.  Abdominal:     General: There is no distension.     Palpations: Abdomen is soft.     Tenderness: There is no abdominal tenderness.  Musculoskeletal:        General: No tenderness.     Cervical back: Normal range of motion and neck supple.  Skin:    General: Skin is warm and dry.  Neurological:     Mental Status: She is alert and oriented to person, place, and time.  Psychiatric:        Behavior: Behavior normal.     ED Results / Procedures / Treatments   Labs (all labs ordered are listed, but only abnormal results are displayed) Labs Reviewed  CBC WITH DIFFERENTIAL/PLATELET - Abnormal; Notable for the following components:      Result Value   RBC 3.14 (*)    Hemoglobin 9.8 (*)    HCT 32.9 (*)    MCV 104.8 (*)    MCHC 29.8 (*)    Abs Immature Granulocytes 0.09 (*)    All other components within normal limits  BASIC METABOLIC PANEL - Abnormal; Notable for the following components:   Potassium 6.6 (*)    Chloride 114 (*)     CO2 20 (*)    Glucose, Bld 166 (*)    BUN 36 (*)    Creatinine, Ser 3.18 (*)    Calcium 8.6 (*)    GFR, Estimated 13 (*)    All other components within normal limits  BRAIN NATRIURETIC PEPTIDE - Abnormal; Notable for the following components:   B Natriuretic Peptide 227.0 (*)    All other components within normal limits  RESP PANEL BY RT-PCR (FLU A&B, COVID) ARPGX2  CULTURE, BLOOD (ROUTINE X 2)  CULTURE, BLOOD (ROUTINE X 2)  POTASSIUM  VITAMIN B12  FOLATE  IRON AND TIBC  FERRITIN  RETICULOCYTES  TROPONIN I (HIGH SENSITIVITY)    EKG EKG Interpretation  Date/Time:  Tuesday February 05 2020 14:31:33 EST Ventricular Rate:  65 PR Interval:    QRS Duration: 148 QT Interval:  450 QTC Calculation: 468 R Axis:   -79 Text Interpretation: Sinus rhythm Atrial premature complex Short PR interval RBBB and LAFB Probable left ventricular hypertrophy since last tracing no significant change Confirmed by Noemi Chapel [phone]) on 02/05/2020 2:55:35 PM   Radiology DG Chest Port 1 View  Result Date: 02/05/2020 CLINICAL DATA:  Shortness of breath x1 month. EXAM: PORTABLE CHEST 1 VIEW COMPARISON:  March 21, 2019 FINDINGS: Moderate severity diffuse chronic appearing increased interstitial lung markings are seen. Mild atelectasis and/or infiltrate is seen within the lateral aspect of the left lung base. There is a small left pleural effusion. No pneumothorax is identified. The heart size and mediastinal contours are within normal limits. There is marked severity calcification of the aortic arch. Chronic left-sided rib fractures are seen. IMPRESSION: 1. Chronic interstitial lung disease with mild left basilar atelectasis and/or infiltrate. 2. Small left pleural effusion. Electronically Signed   By: Hoover Browns  [place] M.D.   On: 02/05/2020 15:43    Procedures Procedures (including critical care time)  Medications Ordered in ED Medications  azithromycin (ZITHROMAX) 500 mg in sodium chloride 0.9 %  250 mL IVPB (500 mg Intravenous New Bag/Given 02/05/20 1802)  sodium chloride 0.9 % bolus 500 mL (has no administration in time range)  albuterol (VENTOLIN HFA) 108 (90 Base) MCG/ACT inhaler 2 puff (2 puffs Inhalation Given 02/05/20 1524)  cefTRIAXone (ROCEPHIN) 1 g in sodium chloride 0.9 % 100 mL IVPB (0 g Intravenous Stopped 02/05/20 1800)  insulin aspart (novoLOG) injection 5 Units (5 Units Intravenous Given 02/05/20 1714)    And  dextrose 50 % solution 50 mL (50 mLs Intravenous Given 02/05/20 1715)    ED Course  I have reviewed the triage vital signs and the nursing notes.  Pertinent labs & imaging results that were available during my care of the patient were reviewed by me and considered in my medical decision making (see chart for details).    MDM Rules/Calculators/A&P                          86 yo F with a chief complaints of shortness of breath.  Ongoing for about a month.  Was seen by her physician today who felt she needed to come here for evaluation.  Oxygen saturation in the 90s on room air.  Placed on oxygen by EMS.  Will obtain a laboratory evaluation chest x-ray reassess.  Chest x-ray viewed by me with a left lower lobe infiltrate.  Looks similar to when she was admitted back in January with pneumonia.  Will start on antibiotics.  I discussed this with her son-in-law, Dr. Karie Kirks.  Agreed with admission.  Will discuss with hospitalist.   Patient's potassium is elevated 6.6. No EKG changes. Will give insulin and dextrose.   CRITICAL CARE Performed by: Cecilio Asper   Total critical care time: 35 minutes  Critical care time was exclusive of separately billable procedures and treating other patients.  Critical care was necessary to treat or prevent imminent or life-threatening deterioration.  Critical care was time spent personally by me on the following activities: development of treatment plan with patient and/or surrogate as well as nursing, discussions  with consultants, evaluation of patient's response to treatment, examination of patient, obtaining history from patient or surrogate, ordering and performing treatments and interventions, ordering and review of laboratory studies, ordering and review of radiographic studies, pulse oximetry and re-evaluation of patient's condition.  The patients results and plan were reviewed and discussed.   Any x-rays performed were independently reviewed by myself.   Differential diagnosis were considered with the presenting HPI.  Medications  azithromycin (ZITHROMAX) 500 mg in sodium chloride 0.9 % 250 mL IVPB (500 mg Intravenous New Bag/Given 02/05/20 1802)  sodium chloride 0.9 % bolus 500 mL (has no administration in time range)  albuterol (VENTOLIN HFA) 108 (90 Base) MCG/ACT inhaler 2 puff (2 puffs Inhalation Given 02/05/20 1524)  cefTRIAXone (ROCEPHIN) 1 g in sodium chloride 0.9 % 100 mL IVPB (0 g Intravenous Stopped 02/05/20 1800)  insulin aspart (novoLOG) injection 5 Units (5 Units Intravenous Given 02/05/20 1714)    And  dextrose 50 % solution 50 mL (50 mLs Intravenous Given 02/05/20 1715)    Vitals:   02/05/20 1645 02/05/20 1700 02/05/20 1703 02/05/20 1800  BP:   (!) 163/42 126/65  Pulse: 60 62 61 65  Resp: (!) 21 (!) 26 (!) 25 Marland Kitchen)  31  Temp:      TempSrc:      SpO2: 100% 97% 97% 96%  Weight:      Height:        Final diagnoses:  Community acquired pneumonia of left lower lobe of lung  Hyperkalemia    Admission/ observation were discussed with the admitting physician, patient and/or family and they are comfortable with the plan.   Final Clinical Impression(s) / ED Diagnoses Final diagnoses:  Community acquired pneumonia of left lower lobe of lung  Hyperkalemia    Rx / DC Orders ED Discharge Orders    None       Deno Etienne, DO 02/05/20 1825

## 2020-02-05 NOTE — ED Notes (Signed)
CRITICAL VALUE ALERT  Critical Value:  K+ 6.6  Date & Time Notied:  02/02/2020 1636  Provider Notified: Dr. Tyrone Nine  Orders Received/Actions taken: see chart

## 2020-02-05 NOTE — ED Notes (Signed)
Attempted report x 2 

## 2020-02-05 NOTE — ED Triage Notes (Signed)
Pt brought to ED via [facility] for SOB x 1 month with exertion. Pt sat on RA 92-93%

## 2020-02-05 NOTE — H&P (Signed)
TRH H&P    Patient Demographics:    Regina Bush, is a 86 y.o. female  MRN: [id number]  DOB - 1927-05-29  Admit Date - 02/05/2020  Referring MD/NP/PA: Tyrone Nine  Outpatient Primary MD for the patient is Doree Albee, MD  Patient coming from: Nursing home  Chief complaint-dyspnea   HPI:    Regina Bush  is a 86 y.o. female, with history of thyroid disease, subdural hematoma, protein calorie malnutrition, hypertension, failure to thrive, diabetes mellitus without complication, coronary artery disease, CVA, and more presents the ED with a chief complaint of dyspnea.  Due to patient's dementia history is limited.  She reports that she has been feeling short of breath for too long.  Nursing home staff reported that it was about a month.  Patient reports that she is also been coughing, but she does not think that it has been productive of any sputum.  She has no chest pains, no palpitations.  She has had no fevers, no body aches.  She denies any dysuria.  She also denies any muscle cramping.  It is unclear how accurate her review of systems is given her dementia.  On chart review it looks like about 3 weeks ago patient was treated with Ceftin for acute cystitis.  Also today she was sent to the ER due to an oxygen saturation of 91%.  Patient initially refused transport to the ER, somehow Dr. Anastasio Champion was involved and when her O2 sat was rechecked and was still 91% he advised that she needs to go to the ER now.  In the ED Temperature 98.1, heart rate 57-65, respiratory rate 30, blood pressure 155/43, satting at 97% on 2 L nasal cannula No leukocytosis with a white blood cell count of 9.6     Review of systems:    In addition to the HPI above,  No Fever-chills, No Headache, No changes with Vision or hearing, No problems swallowing food or Liquids, No Chest pain, admits to cough and shortness of Breath, No  Abdominal pain, No Nausea or Vomiting, bowel movements are regular, No Blood in stool or Urine, No dysuria, No new skin rashes or bruises, No new joints pains-aches,  No new weakness, tingling, numbness in any extremity, No recent weight gain or loss, No polyuria, polydypsia or polyphagia, No significant Mental Stressors.  All other systems reviewed and are negative.    Past History of the following :    Past Medical History:  Diagnosis Date  . Anemia   . Arthritis   . Cerebral infarction ([place])   . Coronary artery disease   . Dementia ([place])   . Diabetes mellitus without complication (HCC)    Type 2  . Dysphagia due to recent cerebral infarction   . Failure to thrive (0-17)   . Hypercalcemia   . Hypertension   . Pneumonitis   . Protein calorie malnutrition ([place])   . Subdural hematoma ([place])   . Thyroid disease       History reviewed. No pertinent surgical history.    Social History:  Social History   Tobacco Use  . Smoking status: Never Smoker  . Smokeless tobacco: Never Used  Substance Use Topics  . Alcohol use: No       Family History :     Family History  Family history unknown: Yes  Patient is unable to provide family history   Home Medications:   Prior to Admission medications   Medication Sig Start Date End Date Taking? Authorizing Provider  acetaminophen (TYLENOL) 325 MG tablet Take 2 tablets (650 mg total) by mouth every 6 (six) hours as needed for mild pain or moderate pain. 08/25/16  Yes Rosita Fire, MD  amLODipine (NORVASC) 10 MG tablet Take 10 mg by mouth daily.    Yes [provider]  aspirin EC 81 MG tablet Take 81 mg by mouth daily.   Yes [provider]  atorvastatin (LIPITOR) 20 MG tablet Take 20 mg by mouth daily.    Yes [provider]  cetirizine (ZYRTEC) 10 MG tablet Take 10 mg by mouth at bedtime as needed for allergies.   Yes [provider]  Cholecalciferol (VITAMIN D3) 125 MCG (5000  UT) TABS Take 2 tablets (10,000 Units total) by mouth daily. 05/24/19  Yes Gosrani, Nimish C, MD  famotidine (PEPCID) 20 MG tablet Take 20 mg by mouth daily.   Yes [provider]  fluticasone (FLONASE) 50 MCG/ACT nasal spray Place 2 sprays into both nostrils daily.   Yes [provider]  hydrALAZINE (APRESOLINE) 25 MG tablet Take 25 mg by mouth 3 (three) times daily. Give 1 tablet by mouth three times a day for HTN hold for systolic BP <329   Yes [provider]  levothyroxine (SYNTHROID) 100 MCG tablet Take 90 mcg by mouth daily before breakfast.   Yes [provider]  magnesium oxide (MAG-OX) 400 MG tablet Take 400 mg by mouth daily.    Yes [provider]  Multiple Vitamin (MULTIVITAMIN WITH MINERALS) TABS tablet Take 1 tablet by mouth daily.    Yes [provider]  rOPINIRole (REQUIP) 0.5 MG tablet Take 1 tablet (0.5 mg total) by mouth at bedtime. 12/24/19  Yes Gosrani, Nimish C, MD  tamsulosin (FLOMAX) 0.4 MG CAPS capsule Take 0.4 mg by mouth every evening.   Yes [provider]  traZODone (DESYREL) 50 MG tablet Take 50 mg by mouth at bedtime.   Yes [provider]  trimethoprim (TRIMPEX) 100 MG tablet Take 1 tablet (100 mg total) by mouth daily. 03/26/19  Yes Johnson, Clanford L, MD  Wheat Dextrin (BENEFIBER PO) Take 10 mLs by mouth daily.    Yes [provider]  cefUROXime (CEFTIN) 250 MG tablet Take 1 tablet (250 mg total) by mouth daily. 01/14/20   Ailene Ards, NP  ibuprofen (ADVIL) 800 MG tablet Take 800 mg by mouth 3 (three) times daily. 11/27/19   [provider]  miconazole (MICOTIN) 2 % cream Apply 1 application topically 2 (two) times daily.    [provider]  NP THYROID 90 MG tablet Take 1 tablet (90 mg total) by mouth daily. Patient not taking: Reported on 02/05/2020 12/05/19   Doree Albee, MD  QUEtiapine (SEROQUEL) 100 MG tablet Take 100 mg by mouth at bedtime. 11/27/19    [provider]     Allergies:     Allergies  Allergen Reactions  . Cymbalta [Duloxetine Hcl] Other (See Comments)    unknown  . Effexor [Venlafaxine] Other (See Comments)    unknown  .  Morphine And Related Other (See Comments)    Passed out   . Tape     adhesive     Physical Exam:   Vitals  Blood pressure (!) 163/42, pulse 61, temperature 98.1 F (36.7 C), temperature source Oral, resp. rate (!) 25, height 5\' 3"  (1.6 m), weight 63.5 kg, SpO2 97 %.  1.  General: Patient is supine in bed with head of bed elevated, no acute distress  2. Psychiatric: Mood and behavior normal for situation, cooperative with exam, alert and oriented to self and place, does not know the year but does know her birthday and her age  29. Neurologic: Cranial nerves II through XII are intact, moves all 4 extremities voluntarily, speech and language are normal, no acute deficit on limited exam  4. HEENMT:  Head is atraumatic, normocephalic, pupils reactive to light, neck is supple, trachea is midline, mucous membranes are mildly dry  5. Respiratory : Rhonchi in both lung fields but greater on left, no wheezing or crackles, no cyanosis, tachypnea improving  6. Cardiovascular : Heart rate is normal, rhythm is regular, no murmurs rubs or gallops  7. Gastrointestinal:  Diminished soft, nondistended, nontender to palpation, bowel sounds active  8. Skin:  No acute lesions on limited skin exam  9.Musculoskeletal:  No acute deformity, calf tenderness, peripheral edema    Data Review:    CBC Recent Labs  Lab 02/05/20 1517  WBC 9.6  HGB 9.8*  HCT 32.9*  PLT 255  MCV 104.8*  MCH 31.2  MCHC 29.8*  RDW 13.2  LYMPHSABS 1.1  MONOABS 0.9  EOSABS 0.5  BASOSABS 0.0   ------------------------------------------------------------------------------------------------------------------  Results for orders placed or performed during the hospital encounter of 02/05/20 (from the past 48  hour(s))  CBC with Differential     Status: Abnormal   Collection Time: 02/05/20  3:17 PM  Result Value Ref Range   WBC 9.6 4.0 - 10.5 K/uL   RBC 3.14 (L) 3.87 - 5.11 MIL/uL   Hemoglobin 9.8 (L) 12.0 - 15.0 g/dL   HCT 32.9 (L) 36 - 46 %   MCV 104.8 (H) 80.0 - 100.0 fL   MCH 31.2 26.0 - 34.0 pg   MCHC 29.8 (L) 30.0 - 36.0 g/dL   RDW 13.2 11.5 - 15.5 %   Platelets 255 150 - 400 K/uL   nRBC 0.0 0.0 - 0.2 %   Neutrophils Relative % 73 %   Neutro Abs 7.1 1.7 - 7.7 K/uL   Lymphocytes Relative 12 %   Lymphs Abs 1.1 0.7 - 4.0 K/uL   Monocytes Relative 9 %   Monocytes Absolute 0.9 0.1 - 1.0 K/uL   Eosinophils Relative 5 %   Eosinophils Absolute 0.5 0.0 - 0.5 K/uL   Basophils Relative 0 %   Basophils Absolute 0.0 0.0 - 0.1 K/uL   Immature Granulocytes 1 %   Abs Immature Granulocytes 0.09 (H) 0.00 - 0.07 K/uL    Comment: Performed at [facility], [address] [place], [place] [address]  Basic metabolic panel     Status: Abnormal   Collection Time: 02/05/20  3:17 PM  Result Value Ref Range   Sodium 142 135 - 145 mmol/L   Potassium 6.6 (HH) 3.5 - 5.1 mmol/L    Comment: CRITICAL RESULT CALLED TO, READ BACK BY AND VERIFIED WITH: DOSS,M ON 02/05/20 AT 1630 BY LOY,C    Chloride 114 (H) 98 - 111 mmol/L   CO2 20 (L) 22 - 32 mmol/L   Glucose,  Bld 166 (H) 70 - 99 mg/dL    Comment: Glucose reference range applies only to samples taken after fasting for at least 8 hours.   BUN 36 (H) 8 - 23 mg/dL   Creatinine, Ser 3.18 (H) 0.44 - 1.00 mg/dL   Calcium 8.6 (L) 8.9 - 10.3 mg/dL   GFR, Estimated 13 (L) >60 mL/min    Comment: (NOTE) Calculated using the CKD-EPI Creatinine Equation (2021)    Anion gap 8 5 - 15    Comment: Performed at [facility], [address], [place], [place] [address]  Troponin I (High Sensitivity)     Status: None   Collection Time: 02/05/20  3:17 PM  Result Value Ref Range   Troponin I (High Sensitivity) 8 <18 ng/L    Comment: (NOTE) Elevated high sensitivity  troponin I (hsTnI) values and significant  changes across serial measurements may suggest ACS but many other  chronic and acute conditions are known to elevate hsTnI results.  Refer to the Links section for chest pain algorithms and additional  guidance. Performed at [facility], [address], [place], [place] [address]   Brain natriuretic peptide     Status: Abnormal   Collection Time: 02/05/20  3:17 PM  Result Value Ref Range   B Natriuretic Peptide 227.0 (H) 0.0 - 100.0 pg/mL    Comment: Performed at [facility], [address] [place], [place] [address]  Resp Panel by RT-PCR (Flu A&B, Covid) Nasopharyngeal Swab     Status: None   Collection Time: 02/05/20  3:45 PM   Specimen: Nasopharyngeal Swab; Nasopharyngeal(NP) swabs in vial transport medium  Result Value Ref Range   SARS Coronavirus 2 by RT PCR NEGATIVE NEGATIVE    Comment: (NOTE) SARS-CoV-2 target nucleic acids are NOT DETECTED.  The SARS-CoV-2 RNA is generally detectable in upper respiratory specimens during the acute phase of infection. The lowest concentration of SARS-CoV-2 viral copies this assay can detect is 138 copies/mL. A negative result does not preclude SARS-Cov-2 infection and should not be used as the sole basis for treatment or other patient management decisions. A negative result may occur with  improper specimen collection/handling, submission of specimen other than nasopharyngeal swab, presence of viral mutation(s) within the areas targeted by this assay, and inadequate number of viral copies(<138 copies/mL). A negative result must be combined with clinical observations, patient history, and epidemiological information. The expected result is Negative.  Fact Sheet for Patients:  EntrepreneurPulse.com.au  Fact Sheet for Healthcare Providers:  IncredibleEmployment.be  This test is no t yet approved or cleared by the [place] FDA and  has been authorized for  detection and/or diagnosis of SARS-CoV-2 by FDA under an Emergency Use Authorization (EUA). This EUA will remain  in effect (meaning this test can be used) for the duration of the COVID-19 declaration under Section 564(b)(1) of the Act, 21 U.S.C.section 360bbb-3(b)(1), unless the authorization is terminated  or revoked sooner.       Influenza A by PCR NEGATIVE NEGATIVE   Influenza B by PCR NEGATIVE NEGATIVE    Comment: (NOTE) The Xpert Xpress SARS-CoV-2/FLU/RSV plus assay is intended as an aid in the diagnosis of influenza from Nasopharyngeal swab specimens and should not be used as a sole basis for treatment. Nasal washings and aspirates are unacceptable for Xpert Xpress SARS-CoV-2/FLU/RSV testing.  Fact Sheet for Patients: EntrepreneurPulse.com.au  Fact Sheet for Healthcare Providers: IncredibleEmployment.be  This test is not yet approved or cleared by the [place] and has been authorized  for detection and/or diagnosis of SARS-CoV-2 by FDA under an Emergency Use Authorization (EUA). This EUA will remain in effect (meaning this test can be used) for the duration of the COVID-19 declaration under Section 564(b)(1) of the Act, 21 U.S.C. section 360bbb-3(b)(1), unless the authorization is terminated or revoked.  Performed at [facility], [address] [place], [place] [address]   Blood culture (routine x 2)     Status: None (Preliminary result)   Collection Time: 02/05/20  4:36 PM   Specimen: BLOOD RIGHT HAND  Result Value Ref Range   Specimen Description BLOOD RIGHT HAND    Special Requests      BOTTLES DRAWN AEROBIC AND ANAEROBIC Blood Culture adequate volume Performed at [facility], [address], [place], [place] [address]    Culture PENDING    Report Status PENDING   Blood culture (routine x 2)     Status: None (Preliminary result)   Collection Time: 02/05/20  4:37 PM   Specimen: BLOOD  Result Value Ref Range    Specimen Description BLOOD LEFT ANTECUBITAL    Special Requests      BOTTLES DRAWN AEROBIC AND ANAEROBIC Blood Culture results may not be optimal due to an inadequate volume of blood received in culture bottles Performed at [facility], [address] [place], [place] [address]    Culture PENDING    Report Status PENDING     Chemistries  Recent Labs  Lab 02/05/20 1517  NA 142  K 6.6*  CL 114*  CO2 20*  GLUCOSE 166*  BUN 36*  CREATININE 3.18*  CALCIUM 8.6*   ------------------------------------------------------------------------------------------------------------------  ------------------------------------------------------------------------------------------------------------------ GFR: Estimated Creatinine Clearance: 10.5 mL/min (A) (by C-G formula based on SCr of 3.18 mg/dL (H)). Liver Function Tests: No results for input(s): AST, ALT, ALKPHOS, BILITOT, PROT, ALBUMIN in the last 168 hours. No results for input(s): LIPASE, AMYLASE in the last 168 hours. No results for input(s): AMMONIA in the last 168 hours. Coagulation Profile: No results for input(s): INR, PROTIME in the last 168 hours. Cardiac Enzymes: No results for input(s): CKTOTAL, CKMB, CKMBINDEX, TROPONINI in the last 168 hours. BNP (last 3 results) No results for input(s): PROBNP in the last 8760 hours. HbA1C: No results for input(s): HGBA1C in the last 72 hours. CBG: No results for input(s): GLUCAP in the last 168 hours. Lipid Profile: No results for input(s): CHOL, HDL, LDLCALC, TRIG, CHOLHDL, LDLDIRECT in the last 72 hours. Thyroid Function Tests: No results for input(s): TSH, T4TOTAL, FREET4, T3FREE, THYROIDAB in the last 72 hours. Anemia Panel: No results for input(s): VITAMINB12, FOLATE, FERRITIN, TIBC, IRON, RETICCTPCT in the last 72 hours.  --------------------------------------------------------------------------------------------------------------- Urine analysis:    Component Value Date/Time    COLORURINE YELLOW 01/11/2020 1730   APPEARANCEUR HAZY (A) 01/11/2020 1730   LABSPEC 1.018 01/11/2020 1730   PHURINE 5.0 01/11/2020 1730   GLUCOSEU >=500 (A) 01/11/2020 1730   HGBUR NEGATIVE 01/11/2020 1730   BILIRUBINUR NEGATIVE 01/11/2020 1730   KETONESUR NEGATIVE 01/11/2020 1730   PROTEINUR >=300 (A) 01/11/2020 1730   NITRITE NEGATIVE 01/11/2020 1730   LEUKOCYTESUR NEGATIVE 01/11/2020 1730      Imaging Results:    DG Chest Port 1 View  Result Date: 02/05/2020 CLINICAL DATA:  Shortness of breath x1 month. EXAM: PORTABLE CHEST 1 VIEW COMPARISON:  March 21, 2019 FINDINGS: Moderate severity diffuse chronic appearing increased interstitial lung markings are seen. Mild atelectasis and/or infiltrate is seen within the lateral aspect of the left lung base. There is a small left  pleural effusion. No pneumothorax is identified. The heart size and mediastinal contours are within normal limits. There is marked severity calcification of the aortic arch. Chronic left-sided rib fractures are seen. IMPRESSION: 1. Chronic interstitial lung disease with mild left basilar atelectasis and/or infiltrate. 2. Small left pleural effusion. Electronically Signed   By: Virgina Norfolk M.D.   On: 02/05/2020 15:43    My personal review of EKG: Rhythm NSR, Rate 65 /min, QTc 468,no Acute ST changes   Assessment & Plan:    Principal Problem:   AKI (acute kidney injury) ([place]) Active Problems:   Hypothyroidism   Pneumonia   Hyperkalemia   1. Community acquired pneumonia 1. Nursing home staff reported shortness of breath for 1 month 2. Looks like patient may have been treated with Ceftin previously 3. Shortness of breath, oxygen saturations in the 90s, placed on O2 for comfort 4. Zithromax and Rocephin started in the ED 5. Legionella and strep antigen 6. Sputum culture 7. Does not meet SIRS criteria 8. Continue to monitor 2. AKI 1. Unclear etiology 2. Hold nephrotoxic agents when possible 3. 500  mL bolus given in ED 4. Continue gentle fluids 5. Trend in the a.m. 3. Hyperkalemia 1. Potassium 6.6 2. Insulin and D50 given in ED as well as 500 mL bolus 3. Recheck potassium 4. Consider Kayexalate if potassium remains elevated 5. Monitor on telemetry 6. EKG without electrolyte related changes 4. Macrocytic anemia 1. Hgb dropped from 11.7-9.8 over 8 months 2. MCV 104.8 3. Anemia panel pending 4. Trend hemoglobin in the a.m. 5. Hypertension 1. Continue home medications 2. Blood pressure stable at 155/43 in the ED 6. Hypothyroid disease 1. Continue levothyroxine 2. Check TSH in the a.m. 7.    DVT Prophylaxis-Heparin- SCDs  AM Labs Ordered, also please review Full Orders  Family Communication: No family at bedside  Code Status: Full -previous admissions have been mostly DNR with a couple of full code, patient explicitly said "I want to do whatever it takes to keep going" when questioned about CODE STATUS  Admission status:Inpatient :The appropriate admission status for this patient is INPATIENT. Inpatient status is judged to be reasonable and necessary in order to provide the required intensity of service to ensure the patient's safety. The patient's presenting symptoms, physical exam findings, and initial radiographic and laboratory data in the context of their chronic comorbidities is felt to place them at high risk for further clinical deterioration. Furthermore, it is not anticipated that the patient will be medically stable for discharge from the hospital within 2 midnights of admission. The following factors support the admission status of inpatient.     The patient's presenting symptoms include dyspnea The worrisome physical exam findings include tachypnea The initial radiographic and laboratory data are worrisome because of pneumonia on chest x-ray, hyperkalemia. The chronic co-morbidities include thyroid disease, hypertension, coronary artery disease, history of CVA,  dementia       * I certify that at the point of admission it is my clinical judgment that the patient will require inpatient hospital care spanning beyond 2 midnights from the point of admission due to high intensity of service, high risk for further deterioration and high frequency of surveillance required.*  Time spent in minutes : [place]

## 2020-02-06 ENCOUNTER — Inpatient Hospital Stay (HOSPITAL_COMMUNITY): Payer: Medicare Other

## 2020-02-06 DIAGNOSIS — J9601 Acute respiratory failure with hypoxia: Secondary | ICD-10-CM

## 2020-02-06 DIAGNOSIS — N179 Acute kidney failure, unspecified: Secondary | ICD-10-CM | POA: Diagnosis not present

## 2020-02-06 DIAGNOSIS — E875 Hyperkalemia: Secondary | ICD-10-CM

## 2020-02-06 LAB — COMPREHENSIVE METABOLIC PANEL
ALT: 11 U/L (ref 0–44)
AST: 11 U/L — ABNORMAL LOW (ref 15–41)
Albumin: 2.7 g/dL — ABNORMAL LOW (ref 3.5–5.0)
Alkaline Phosphatase: 51 U/L (ref 38–126)
Anion gap: 4 — ABNORMAL LOW (ref 5–15)
BUN: 31 mg/dL — ABNORMAL HIGH (ref 8–23)
CO2: 20 mmol/L — ABNORMAL LOW (ref 22–32)
Calcium: 8.2 mg/dL — ABNORMAL LOW (ref 8.9–10.3)
Chloride: 118 mmol/L — ABNORMAL HIGH (ref 98–111)
Creatinine, Ser: 2.85 mg/dL — ABNORMAL HIGH (ref 0.44–1.00)
GFR, Estimated: 15 mL/min — ABNORMAL LOW (ref >60)
Glucose, Bld: 97 mg/dL (ref 70–99)
Potassium: 6.2 mmol/L — ABNORMAL HIGH (ref 3.5–5.1)
Sodium: 142 mmol/L (ref 135–145)
Total Bilirubin: 0.1 mg/dL — ABNORMAL LOW (ref 0.3–1.2)
Total Protein: 5.3 g/dL — ABNORMAL LOW (ref 6.5–8.1)

## 2020-02-06 LAB — CBC
HCT: 28.9 % — ABNORMAL LOW (ref 36.0–46.0)
Hemoglobin: 8.7 g/dL — ABNORMAL LOW (ref 12.0–15.0)
MCH: 31.5 pg (ref 26.0–34.0)
MCHC: 30.1 g/dL (ref 30.0–36.0)
MCV: 104.7 fL — ABNORMAL HIGH (ref 80.0–100.0)
Platelets: 222 10*3/uL (ref 150–400)
RBC: 2.76 MIL/uL — ABNORMAL LOW (ref 3.87–5.11)
RDW: 13.4 % (ref 11.5–15.5)
WBC: 8.7 10*3/uL (ref 4.0–10.5)
nRBC: 0 % (ref 0.0–0.2)

## 2020-02-06 LAB — GLUCOSE, CAPILLARY
Glucose-Capillary: 72 mg/dL (ref 70–99)
Glucose-Capillary: 117 mg/dL — ABNORMAL HIGH (ref 70–99)

## 2020-02-06 LAB — HIV ANTIBODY (ROUTINE TESTING W REFLEX): HIV Screen 4th Generation wRfx: NONREACTIVE

## 2020-02-06 LAB — TSH: TSH: 4.416 u[IU]/mL (ref 0.350–4.500)

## 2020-02-06 LAB — MRSA PCR SCREENING: MRSA by PCR: POSITIVE — AB

## 2020-02-06 MED ORDER — SODIUM CHLORIDE 0.9 % IV SOLN
2.0000 g | INTRAVENOUS | Status: AC
  Administered 2020-02-06 – 2020-02-11 (×5): 2 g via INTRAVENOUS
  Filled 2020-02-06 (×5): qty 20

## 2020-02-06 MED ORDER — HEPARIN SODIUM (PORCINE) 5000 UNIT/ML IJ SOLN
5000.0000 [IU] | Freq: Three times a day (TID) | INTRAMUSCULAR | Status: DC
  Administered 2020-02-06 – 2020-02-12 (×19): 5000 [IU] via SUBCUTANEOUS
  Filled 2020-02-06 (×19): qty 1

## 2020-02-06 MED ORDER — HYDRALAZINE HCL 25 MG PO TABS
25.0000 mg | ORAL_TABLET | Freq: Three times a day (TID) | ORAL | Status: DC
  Administered 2020-02-06 – 2020-02-12 (×20): 25 mg via ORAL
  Filled 2020-02-06 (×20): qty 1

## 2020-02-06 MED ORDER — ONDANSETRON HCL 4 MG/2ML IJ SOLN: 4.0000 mg | Freq: Four times a day (QID) | INTRAMUSCULAR | Status: DC | PRN

## 2020-02-06 MED ORDER — AMLODIPINE BESYLATE 5 MG PO TABS
10.0000 mg | ORAL_TABLET | Freq: Every day | ORAL | Status: DC
  Administered 2020-02-06 – 2020-02-12 (×7): 10 mg via ORAL
  Filled 2020-02-06 (×7): qty 2

## 2020-02-06 MED ORDER — FAMOTIDINE 20 MG PO TABS
20.0000 mg | ORAL_TABLET | Freq: Every day | ORAL | Status: DC
  Administered 2020-02-06 – 2020-02-12 (×7): 20 mg via ORAL
  Filled 2020-02-06 (×7): qty 1

## 2020-02-06 MED ORDER — LEVOTHYROXINE SODIUM 88 MCG PO TABS: 90.0000 ug | ORAL_TABLET | Freq: Every day | ORAL | Status: DC

## 2020-02-06 MED ORDER — SODIUM CHLORIDE 0.9 % IV SOLN: INTRAVENOUS | Status: AC

## 2020-02-06 MED ORDER — NYSTATIN 100000 UNIT/GM EX POWD
Freq: Three times a day (TID) | CUTANEOUS | Status: DC
  Filled 2020-02-06: qty 15

## 2020-02-06 MED ORDER — SODIUM ZIRCONIUM CYCLOSILICATE 5 G PO PACK
5.0000 g | PACK | Freq: Every day | ORAL | Status: AC
  Administered 2020-02-06 – 2020-02-07 (×2): 5 g via ORAL
  Filled 2020-02-06 (×2): qty 1

## 2020-02-06 MED ORDER — QUETIAPINE FUMARATE 100 MG PO TABS: 100.0000 mg | ORAL_TABLET | Freq: Every day | ORAL | Status: DC

## 2020-02-06 MED ORDER — ATORVASTATIN CALCIUM 20 MG PO TABS
20.0000 mg | ORAL_TABLET | Freq: Every day | ORAL | Status: DC
  Administered 2020-02-06 – 2020-02-12 (×7): 20 mg via ORAL
  Filled 2020-02-06 (×7): qty 1

## 2020-02-06 MED ORDER — VITAMIN D 25 MCG (1000 UNIT) PO TABS
10000.0000 [IU] | ORAL_TABLET | Freq: Every day | ORAL | Status: DC
  Administered 2020-02-06 – 2020-02-12 (×6): 10000 [IU] via ORAL
  Filled 2020-02-06 (×6): qty 10

## 2020-02-06 MED ORDER — SODIUM CHLORIDE 0.9 % IV SOLN
500.0000 mg | INTRAVENOUS | Status: AC
  Administered 2020-02-06 – 2020-02-10 (×5): 500 mg via INTRAVENOUS
  Filled 2020-02-06 (×5): qty 500

## 2020-02-06 MED ORDER — LORAZEPAM 2 MG/ML IJ SOLN
0.5000 mg | Freq: Four times a day (QID) | INTRAMUSCULAR | Status: DC | PRN
  Administered 2020-02-11: 0.5 mg via INTRAVENOUS
  Filled 2020-02-06: qty 1

## 2020-02-06 MED ORDER — ADULT MULTIVITAMIN W/MINERALS CH
1.0000 | ORAL_TABLET | Freq: Every day | ORAL | Status: DC
  Administered 2020-02-06 – 2020-02-12 (×7): 1 via ORAL
  Filled 2020-02-06 (×7): qty 1

## 2020-02-06 MED ORDER — TAMSULOSIN HCL 0.4 MG PO CAPS
0.4000 mg | ORAL_CAPSULE | Freq: Every evening | ORAL | Status: DC
  Administered 2020-02-06 – 2020-02-12 (×7): 0.4 mg via ORAL
  Filled 2020-02-06 (×7): qty 1

## 2020-02-06 MED ORDER — ASPIRIN EC 81 MG PO TBEC
81.0000 mg | DELAYED_RELEASE_TABLET | Freq: Every day | ORAL | Status: DC
  Administered 2020-02-06 – 2020-02-12 (×7): 81 mg via ORAL
  Filled 2020-02-06 (×7): qty 1

## 2020-02-06 MED ORDER — ACETAMINOPHEN 325 MG PO TABS
650.0000 mg | ORAL_TABLET | Freq: Four times a day (QID) | ORAL | Status: DC | PRN
  Administered 2020-02-06 – 2020-02-12 (×4): 650 mg via ORAL
  Filled 2020-02-06 (×4): qty 2

## 2020-02-06 MED ORDER — THYROID 30 MG PO TABS
90.0000 mg | ORAL_TABLET | Freq: Every day | ORAL | Status: DC
  Administered 2020-02-06 – 2020-02-12 (×7): 90 mg via ORAL
  Filled 2020-02-06 (×7): qty 3

## 2020-02-06 MED ORDER — ROPINIROLE HCL 0.25 MG PO TABS
0.5000 mg | ORAL_TABLET | Freq: Every day | ORAL | Status: DC
  Administered 2020-02-06 – 2020-02-11 (×7): 0.5 mg via ORAL
  Filled 2020-02-06 (×7): qty 2

## 2020-02-06 MED ORDER — ALBUTEROL SULFATE (2.5 MG/3ML) 0.083% IN NEBU
2.5000 mg | INHALATION_SOLUTION | Freq: Four times a day (QID) | RESPIRATORY_TRACT | Status: DC | PRN
  Administered 2020-02-06 – 2020-02-07 (×2): 2.5 mg via RESPIRATORY_TRACT
  Filled 2020-02-06 (×2): qty 3

## 2020-02-06 MED ORDER — TRAZODONE HCL 50 MG PO TABS
50.0000 mg | ORAL_TABLET | Freq: Every day | ORAL | Status: DC
  Administered 2020-02-06 – 2020-02-11 (×7): 50 mg via ORAL
  Filled 2020-02-06 (×7): qty 1

## 2020-02-06 NOTE — Progress Notes (Signed)
[facility]  PROGRESS NOTE  Regina Bush DEY:[id number] DOB: 10/07/1927 DOA: 02/05/2020 PCP: Doree Albee, MD   Brief HPI:   86 year old female with a history of thyroid disease, SDH, protein calorie malnutrition, hypertension, failure to thrive, diabetes mellitus or complication, CAD, CVA came with dyspnea    Subjective   Patient continues to have mild shortness of breath, still requiring 2 L/min of oxygen.   Assessment/Plan:     1. Acute hypoxemic respiratory failure-CT chest high-resolution obtained this morning shows bilateral pulmonary edema, inflammatory bronchitis.  Patient started on Zithromax and Rocephin.  BNP is mildly elevated to 227.0.  Patient will need diuretics, but has worsening renal function with hyperkalemia.  Will consult nephrology for further help with diuresis. 2. Acute kidney injury on CKD stage IV-patient baseline creatinine around 2.8, today creatinine is down to 2.85.  Potassium still elevated 6.2.  Will consult nephrology in a.m.  Follow BMP in am.  Avoid nephrotoxins. 3. Hyperkalemia-potassium is 6.2.  Will give 1 dose of Lokelma.  Follow BMP in am. 4. Hypertension-blood pressure stable, continue home medications. 5. Hypothyroidism-continue Synthroid.  TSH 4.416.     COVID-19 Labs  Recent Labs    02/05/20 1517  FERRITIN 35    Lab Results  Component Value Date   SARSCOV2NAA NEGATIVE 02/05/2020   [place] NEGATIVE 03/18/2019     Scheduled medications:   . amLODipine  10 mg Oral Daily  . aspirin EC  81 mg Oral Daily  . atorvastatin  20 mg Oral Daily  . cholecalciferol  10,000 Units Oral Daily  . famotidine  20 mg Oral Daily  . heparin  5,000 Units Subcutaneous Q8H  . hydrALAZINE  25 mg Oral TID  . multivitamin with minerals  1 tablet Oral Daily  . nystatin   Topical TID  . rOPINIRole  0.5 mg Oral QHS  . sodium zirconium cyclosilicate  5 g Oral Daily  . tamsulosin  0.4 mg Oral QPM  . thyroid  90 mg Oral QAC breakfast  .  traZODone  50 mg Oral QHS         CBG: Recent Labs  Lab 02/06/20 0822  GLUCAP 72    SpO2: 99 % O2 Flow Rate (L/min): 2 L/min    CBC: Recent Labs  Lab 02/05/20 1517 02/06/20 0525  WBC 9.6 8.7  NEUTROABS 7.1  --   HGB 9.8* 8.7*  HCT 32.9* 28.9*  MCV 104.8* 104.7*  PLT 255 314    Basic Metabolic Panel: Recent Labs  Lab 02/05/20 1517 02/05/20 1800 02/06/20 0525  NA 142  --  142  K 6.6* 5.8* 6.2*  CL 114*  --  118*  CO2 20*  --  20*  GLUCOSE 166*  --  97  BUN 36*  --  31*  CREATININE 3.18*  --  2.85*  CALCIUM 8.6*  --  8.2*     Liver Function Tests: Recent Labs  Lab 02/06/20 0525  AST 11*  ALT 11  ALKPHOS 51  BILITOT 0.1*  PROT 5.3*  ALBUMIN 2.7*     Antibiotics: Anti-infectives (From admission, onward)   Start     Dose/Rate Route Frequency Ordered Stop   02/06/20 1730  cefTRIAXone (ROCEPHIN) 2 g in sodium chloride 0.9 % 100 mL IVPB        2 g 200 mL/hr over 30 Minutes Intravenous Every 24 hours 02/06/20 0015 02/11/20 1729   02/06/20 1730  azithromycin (ZITHROMAX) 500 mg in sodium chloride 0.9 % 250 mL IVPB  500 mg 250 mL/hr over 60 Minutes Intravenous Every 24 hours 02/06/20 0015 02/11/20 1729   02/05/20 1545  cefTRIAXone (ROCEPHIN) 1 g in sodium chloride 0.9 % 100 mL IVPB        1 g 200 mL/hr over 30 Minutes Intravenous  Once 02/05/20 1544 02/05/20 1800   02/05/20 1545  azithromycin (ZITHROMAX) 500 mg in sodium chloride 0.9 % 250 mL IVPB        500 mg 250 mL/hr over 60 Minutes Intravenous  Once 02/05/20 1544 02/05/20 1926       DVT prophylaxis: Heparin   Code Status: Full code  Family Communication: No family at bedside   Consultants:  Procedures:      Objective   Vitals:   02/05/20 2300 02/06/20 0556 02/06/20 1150 02/06/20 1500  BP: (!) 140/50 (!) 162/39 (!) 168/49 (!) 172/48  Pulse: 70 64 68 75  Resp: (!) 23 (!) 22 (!) 22 (!) 21  Temp: 98.2 F (36.8 C) 97.6 F (36.4 C)    TempSrc:  Oral    SpO2: 97% 96%  95% 99%  Weight: 75.8 kg     Height:        Intake/Output Summary (Last 24 hours) at 02/06/2020 1729 Last data filed at 02/05/2020 2105 Gross per 24 hour  Intake 850 ml  Output --  Net 850 ml    11/22 1901 - 11/24 0700 In: 850  Out: -   Filed Weights   02/05/20 1432 02/05/20 2300  Weight: 63.5 kg 75.8 kg    Physical Examination:    General: Appears in no acute distress  Cardiovascular: S1-S2, regular   Respiratory: *clear to auscultation bilaterally  Abdomen: Soft, nontender, no organomegaly  Extremities: No edema in the lower extremities  Neurologic: Alert, oriented x3, intact insight and judgment   Status is: Inpatient  Dispo: The patient is from: Home              Anticipated d/c is to: Assisted living facility              Anticipated d/c date is: ALF              Patient currently not medically stable for discharge  Barrier to discharge-ongoing shortness of breath      Data Reviewed:   Recent Results (from the past 240 hour(s))  Resp Panel by RT-PCR (Flu A&B, Covid) Nasopharyngeal Swab     Status: None   Collection Time: 02/05/20  3:45 PM   Specimen: Nasopharyngeal Swab; Nasopharyngeal(NP) swabs in vial transport medium  Result Value Ref Range Status   SARS Coronavirus 2 by RT PCR NEGATIVE NEGATIVE Final    Comment: (NOTE) SARS-CoV-2 target nucleic acids are NOT DETECTED.  The SARS-CoV-2 RNA is generally detectable in upper respiratory specimens during the acute phase of infection. The lowest concentration of SARS-CoV-2 viral copies this assay can detect is 138 copies/mL. A negative result does not preclude SARS-Cov-2 infection and should not be used as the sole basis for treatment or other patient management decisions. A negative result may occur with  improper specimen collection/handling, submission of specimen other than nasopharyngeal swab, presence of viral mutation(s) within the areas targeted by this assay, and inadequate number of  viral copies(<138 copies/mL). A negative result must be combined with clinical observations, patient history, and epidemiological information. The expected result is Negative.  Fact Sheet for Patients:  EntrepreneurPulse.com.au  Fact Sheet for Healthcare Providers:  IncredibleEmployment.be  This test is no t yet approved  or cleared by the [place] and  has been authorized for detection and/or diagnosis of SARS-CoV-2 by FDA under an Emergency Use Authorization (EUA). This EUA will remain  in effect (meaning this test can be used) for the duration of the COVID-19 declaration under Section 564(b)(1) of the Act, 21 U.S.C.section 360bbb-3(b)(1), unless the authorization is terminated  or revoked sooner.       Influenza A by PCR NEGATIVE NEGATIVE Final   Influenza B by PCR NEGATIVE NEGATIVE Final    Comment: (NOTE) The Xpert Xpress SARS-CoV-2/FLU/RSV plus assay is intended as an aid in the diagnosis of influenza from Nasopharyngeal swab specimens and should not be used as a sole basis for treatment. Nasal washings and aspirates are unacceptable for Xpert Xpress SARS-CoV-2/FLU/RSV testing.  Fact Sheet for Patients: EntrepreneurPulse.com.au  Fact Sheet for Healthcare Providers: IncredibleEmployment.be  This test is not yet approved or cleared by the [place] FDA and has been authorized for detection and/or diagnosis of SARS-CoV-2 by FDA under an Emergency Use Authorization (EUA). This EUA will remain in effect (meaning this test can be used) for the duration of the COVID-19 declaration under Section 564(b)(1) of the Act, 21 U.S.C. section 360bbb-3(b)(1), unless the authorization is terminated or revoked.  Performed at [facility], [address] [place], [place] [address]   Blood culture (routine x 2)     Status: None (Preliminary result)   Collection Time: 02/05/20  4:36 PM   Specimen: BLOOD  RIGHT HAND  Result Value Ref Range Status   Specimen Description BLOOD RIGHT HAND  Final   Special Requests   Final    BOTTLES DRAWN AEROBIC AND ANAEROBIC Blood Culture adequate volume   Culture   Final    NO GROWTH < 24 HOURS Performed at [facility], [address] [place], [place] [address]    Report Status PENDING  Incomplete  Blood culture (routine x 2)     Status: None (Preliminary result)   Collection Time: 02/05/20  4:37 PM   Specimen: BLOOD  Result Value Ref Range Status   Specimen Description BLOOD LEFT ANTECUBITAL  Final   Special Requests   Final    BOTTLES DRAWN AEROBIC AND ANAEROBIC Blood Culture results may not be optimal due to an inadequate volume of blood received in culture bottles   Culture   Final    NO GROWTH < 24 HOURS Performed at [facility], [address], [place], [place] [address]    Report Status PENDING  Incomplete    No results for input(s): LIPASE, AMYLASE in the last 168 hours. No results for input(s): AMMONIA in the last 168 hours.  Cardiac Enzymes: No results for input(s): CKTOTAL, CKMB, CKMBINDEX, TROPONINI in the last 168 hours. BNP (last 3 results) Recent Labs    03/18/19 1530 02/05/20 1517  BNP 225.0* 227.0*    ProBNP (last 3 results) No results for input(s): PROBNP in the last 8760 hours.  Studies:  CT Chest High Resolution  Result Date: 02/06/2020 CLINICAL DATA:  Respiratory failure, interstitial lung disease EXAM: CT CHEST WITHOUT CONTRAST TECHNIQUE: Multidetector CT imaging of the chest was performed following the standard protocol without intravenous contrast. High resolution imaging of the lungs, as well as inspiratory and expiratory imaging, was performed. COMPARISON:  None. FINDINGS: Cardiovascular: Aortic atherosclerosis. Cardiomegaly. Extensive 3 vessel coronary artery calcifications and/or stents. Trace pericardial effusion. Enlargement of the main pulmonary artery measuring up to 3.6 cm in caliber. Mediastinum/Nodes:  Enlarged mediastinal lymph nodes, largest pretracheal nodes  measuring up to 2.2 x 1.6 cm (series 4, image 48). Thyroid gland, trachea, and esophagus demonstrate no significant findings. Lungs/Pleura: Diffuse bilateral bronchial wall thickening. Mild centrilobular emphysema. Small bilateral pleural effusions and associated atelectasis or consolidation. Diffuse interlobular septal thickening. Bandlike scarring and or atelectasis of the left lower lobe and right middle lobe (series 7, image 169). No significant air trapping on expiratory phase imaging. Upper Abdomen: No acute abnormality. Musculoskeletal: No chest wall mass or suspicious bone lesions identified. IMPRESSION: 1. Small bilateral pleural effusions and associated atelectasis or consolidation. 2. Diffuse interlobular septal thickening, consistent with pulmonary edema. 3. Diffuse bilateral bronchial wall thickening, nonspecific and may reflect infectious or inflammatory bronchitis or edema. 4. Bandlike scarring and or atelectasis of the left lower lobe and right middle lobe, most likely sequelae of prior infection. 5. No specific findings to suggest fibrotic interstitial lung disease, although assessment is limited given the presence of edema and effusions. 6. Cardiomegaly and coronary artery disease. 7. Enlargement of the main pulmonary artery, as can be seen in pulmonary hypertension. 8. Enlarged mediastinal lymph nodes, nonspecific and likely reactive. Aortic Atherosclerosis (ICD10-I70.0). Electronically Signed   By: Eddie Candle M.D.   On: 02/06/2020 13:56   DG Chest Port 1 View  Result Date: 02/05/2020 CLINICAL DATA:  Shortness of breath x1 month. EXAM: PORTABLE CHEST 1 VIEW COMPARISON:  March 21, 2019 FINDINGS: Moderate severity diffuse chronic appearing increased interstitial lung markings are seen. Mild atelectasis and/or infiltrate is seen within the lateral aspect of the left lung base. There is a small left pleural effusion. No pneumothorax  is identified. The heart size and mediastinal contours are within normal limits. There is marked severity calcification of the aortic arch. Chronic left-sided rib fractures are seen. IMPRESSION: 1. Chronic interstitial lung disease with mild left basilar atelectasis and/or infiltrate. 2. Small left pleural effusion. Electronically Signed   By: Virgina Norfolk M.D.   On: 02/05/2020 15:43       [place]   [facility] If 7PM-7AM, please contact night-coverage at [email or web address], Office  [phone]   02/06/2020, 5:29 PM  LOS: 1 day

## 2020-02-06 NOTE — Progress Notes (Addendum)
Notified by respiratory, pt with expiratory wheezing. No orders in place for nebs. Notified on call, X. Blount to make aware. Pt also repositioned higher in bed and advised to deep breathe and cough at this time. Congested cough noted. New order given for albuterol 2.5 nebs q 6 hrs for wheezing/SOB. Respiratory aware and administered as ordered.

## 2020-02-06 NOTE — Progress Notes (Signed)
1750: In to check on pt and admin IV abx., Pt complains of right shoulder and right foot pain. Pt has complained of right foot pain off and on all day, states her foot hit the wall during transport from ED to this unit last pm. No bruising noted to foot, pt has been scratching right shoulder. Have offered pt tylenol multiple times today, continues to refuse pain medicine, state, "I'll be fine." Left hand noted to be swollen, also left wrist. IV site checked, unable to get any blood return and difficulty noted with flushing with saline. Pt denies any pain at site. Site removed and bandage applied. IV restart attempted x2 without success. SWOT nurse requested to come attempt IV placement.

## 2020-02-06 NOTE — Progress Notes (Signed)
Pt down via bed to radiology for CT scan of chest.

## 2020-02-06 NOTE — Progress Notes (Signed)
Assessed patient.  Patient has wheezing in lungs.  Sat is 91% and patient is complaining that wheezing and coughing is keeping her awake.  Spoke with RN and asked if we could call the MD to get patient something PRN to help her.  Will continue to monitor.

## 2020-02-06 NOTE — TOC Initial Note (Addendum)
Transition of Care [facility]) - Initial/Assessment Note    Patient Details  Name: Regina Bush MRN: [id number] Date of Birth: 1927/11/11  [facility]) CM/SW Contact:    Shade Flood, LCSW Phone Number: 02/06/2020, 11:17 AM  Clinical Narrative:                  Pt admitted from [facility]. Spoke with pt's daughter who states that plan is for return to [place] at [facility]. Pt is not ambulatory. She uses a wheelchair for mobility. MD indicated pt will have chest CT to evaluate for ILD. Pt currently on 2L O2. She has not been on O2 at baseline. Attempted to reach [facility] at [place] though had to leave a voicemail message requesting return call.   TOC will follow and assist as needed with dc planning.  11:27 Received return call from [place] [facility] [place]. [name] states that they will accept pt back at dc. She asks that [facility] fax FL2 and dc summary to them at [phone] at dc. Pt can return on Thanksgiving if she is medically ready for dc but if pt needs O2, that would be dependent on DME company being able to arrange the O2 on the holiday. No matter when pt ready for dc, TOC to call the ALF to update. If family out of town, pt will need EMS transport.   Expected Discharge Plan: Assisted Living Barriers to Discharge: Continued Medical Work up   Patient Goals and CMS Choice        Expected Discharge Plan and Services Expected Discharge Plan: Assisted Living In-house Referral: Clinical Social Work     Living arrangements for the past 2 months: [place]                                      Prior Living Arrangements/Services Living arrangements for the past 2 months: [place] Lives with:: Facility Resident Patient language and need for interpreter reviewed:: Yes Do you feel safe going back to the place where you live?: Yes      Need for Family Participation in Patient Care: No (Comment) Care giver support system in  place?: Yes (comment)   Criminal Activity/Legal Involvement Pertinent to Current Situation/Hospitalization: No - Comment as needed  Activities of Daily Living Home Assistive Devices/Equipment: None ADL Screening (condition at time of admission) Patient's cognitive ability adequate to safely complete daily activities?: Yes Is the patient deaf or have difficulty hearing?: Yes Does the patient have difficulty seeing, even when wearing glasses/contacts?: No Does the patient have difficulty concentrating, remembering, or making decisions?: Yes Patient able to express need for assistance with ADLs?: Yes Does the patient have difficulty dressing or bathing?: Yes Independently performs ADLs?: No Communication: Independent Dressing (OT): Needs assistance Is this a change from baseline?: Pre-admission baseline Grooming: Needs assistance Is this a change from baseline?: Pre-admission baseline Feeding: Independent Bathing: Needs assistance Is this a change from baseline?: Pre-admission baseline Toileting: Needs assistance Is this a change from baseline?: Pre-admission baseline In/Out Bed: Needs assistance Is this a change from baseline?: Pre-admission baseline Walks in Home: Needs assistance Is this a change from baseline?: Pre-admission baseline Does the patient have difficulty walking or climbing stairs?: Yes Weakness of Legs: Both Weakness of Arms/Hands: None  Permission Sought/Granted                  Emotional Assessment  Orientation: : Oriented to Self, Oriented to Place, Oriented to  Time, Oriented to Situation Alcohol / Substance Use: Not Applicable Psych Involvement: No (comment)  Admission diagnosis:  Hyperkalemia [E87.5] AKI (acute kidney injury) ([place]) [N17.9] Community acquired pneumonia of left lower lobe of lung [J18.9] Patient Active Problem List   Diagnosis Date Noted  . Weakness 04/19/2019  . DNR (do not resuscitate) 03/21/2019  . Goals of care,  counseling/discussion   . Palliative care by specialist   . Dyspnea   . Hyperkalemia   . Hypoxia   . Shortness of breath   . Acute respiratory failure with hypoxia ([place]) 03/19/2019  . Pneumonia 03/19/2019  . AKI (acute kidney injury) ([place]) 03/18/2019  . Urinary retention 02/20/2018  . Hypothyroidism 11/19/2016  . History of CVA in adulthood 11/19/2016  . UTI (urinary tract infection) 11/15/2016  . ARF (acute renal failure) ([place]) 11/15/2016  . Hyponatremia 11/15/2016  . HCAP (healthcare-associated pneumonia) 08/19/2016  . Subdural hematoma ([place])   . Hypertension   . Diabetes mellitus without complication ([place])   . Coronary artery disease    PCP:  Doree Albee, MD Pharmacy:   [place], [place] - [phone] [place] [address] Phone: [phone] Fax: [phone]     Social Determinants of Health (SDOH) Interventions    Readmission Risk Interventions Readmission Risk Prevention Plan 02/06/2020  Transportation Screening Complete  Medication Review (RN CM) Complete  Some recent data might be hidden

## 2020-02-07 DIAGNOSIS — E039 Hypothyroidism, unspecified: Secondary | ICD-10-CM

## 2020-02-07 DIAGNOSIS — E875 Hyperkalemia: Secondary | ICD-10-CM | POA: Diagnosis not present

## 2020-02-07 DIAGNOSIS — N179 Acute kidney failure, unspecified: Secondary | ICD-10-CM | POA: Diagnosis not present

## 2020-02-07 DIAGNOSIS — J9601 Acute respiratory failure with hypoxia: Secondary | ICD-10-CM | POA: Diagnosis not present

## 2020-02-07 LAB — BASIC METABOLIC PANEL
Anion gap: 7 (ref 5–15)
BUN: 30 mg/dL — ABNORMAL HIGH (ref 8–23)
CO2: 20 mmol/L — ABNORMAL LOW (ref 22–32)
Calcium: 8.5 mg/dL — ABNORMAL LOW (ref 8.9–10.3)
Chloride: 116 mmol/L — ABNORMAL HIGH (ref 98–111)
Creatinine, Ser: 2.91 mg/dL — ABNORMAL HIGH (ref 0.44–1.00)
GFR, Estimated: 15 mL/min — ABNORMAL LOW (ref >60)
Glucose, Bld: 114 mg/dL — ABNORMAL HIGH (ref 70–99)
Potassium: 5.3 mmol/L — ABNORMAL HIGH (ref 3.5–5.1)
Sodium: 143 mmol/L (ref 135–145)

## 2020-02-07 LAB — CBC
HCT: 28.9 % — ABNORMAL LOW (ref 36.0–46.0)
Hemoglobin: 8.3 g/dL — ABNORMAL LOW (ref 12.0–15.0)
MCH: 30.6 pg (ref 26.0–34.0)
MCHC: 28.7 g/dL — ABNORMAL LOW (ref 30.0–36.0)
MCV: 106.6 fL — ABNORMAL HIGH (ref 80.0–100.0)
Platelets: 204 10*3/uL (ref 150–400)
RBC: 2.71 MIL/uL — ABNORMAL LOW (ref 3.87–5.11)
RDW: 13.2 % (ref 11.5–15.5)
WBC: 8.1 10*3/uL (ref 4.0–10.5)
nRBC: 0 % (ref 0.0–0.2)

## 2020-02-07 MED ORDER — BUDESONIDE 0.25 MG/2ML IN SUSP
0.2500 mg | Freq: Two times a day (BID) | RESPIRATORY_TRACT | Status: DC
  Administered 2020-02-07 – 2020-02-12 (×9): 0.25 mg via RESPIRATORY_TRACT
  Filled 2020-02-07 (×10): qty 2

## 2020-02-07 MED ORDER — MUPIROCIN 2 % EX OINT
1.0000 "application " | TOPICAL_OINTMENT | Freq: Two times a day (BID) | CUTANEOUS | Status: AC
  Administered 2020-02-07 – 2020-02-11 (×10): 1 via NASAL
  Filled 2020-02-07: qty 22

## 2020-02-07 MED ORDER — GUAIFENESIN ER 600 MG PO TB12
1200.0000 mg | ORAL_TABLET | Freq: Two times a day (BID) | ORAL | Status: DC
  Administered 2020-02-07 – 2020-02-12 (×11): 1200 mg via ORAL
  Filled 2020-02-07 (×11): qty 2

## 2020-02-07 MED ORDER — METHYLPREDNISOLONE SODIUM SUCC 125 MG IJ SOLR
60.0000 mg | Freq: Four times a day (QID) | INTRAMUSCULAR | Status: DC
  Administered 2020-02-07 – 2020-02-10 (×12): 60 mg via INTRAVENOUS
  Filled 2020-02-07 (×13): qty 2

## 2020-02-07 MED ORDER — IPRATROPIUM-ALBUTEROL 0.5-2.5 (3) MG/3ML IN SOLN
3.0000 mL | Freq: Four times a day (QID) | RESPIRATORY_TRACT | Status: DC
  Administered 2020-02-07 (×2): 3 mL via RESPIRATORY_TRACT
  Filled 2020-02-07 (×2): qty 3

## 2020-02-07 MED ORDER — IPRATROPIUM-ALBUTEROL 0.5-2.5 (3) MG/3ML IN SOLN
3.0000 mL | Freq: Two times a day (BID) | RESPIRATORY_TRACT | Status: DC
  Administered 2020-02-08 – 2020-02-12 (×8): 3 mL via RESPIRATORY_TRACT
  Filled 2020-02-07 (×8): qty 3

## 2020-02-07 MED ORDER — CHLORHEXIDINE GLUCONATE CLOTH 2 % EX PADS
6.0000 | MEDICATED_PAD | Freq: Every day | CUTANEOUS | Status: AC
  Administered 2020-02-09 – 2020-02-10 (×2): 6 via TOPICAL

## 2020-02-07 MED ORDER — FUROSEMIDE 10 MG/ML IJ SOLN
40.0000 mg | Freq: Once | INTRAMUSCULAR | Status: AC
  Administered 2020-02-07: 40 mg via INTRAVENOUS
  Filled 2020-02-07: qty 4

## 2020-02-07 NOTE — Plan of Care (Signed)

## 2020-02-07 NOTE — Progress Notes (Addendum)
[facility]  PROGRESS NOTE  Regina Bush EZM:[id number] DOB: Jun 06, 1927 DOA: 02/05/2020 PCP: Doree Albee, MD   Brief HPI:   86 year old female with a history of thyroid disease, SDH, protein calorie malnutrition, hypertension, failure to thrive, diabetes mellitus or complication, CAD, CVA came with dyspnea    Subjective   Patient seen and examined, CT chest high-resolution obtained yesterday shows bilateral inflammatory bronchitis, pulmonary edema.  Patient continues to have shortness of breath requiring 2 to 3 L of oxygen per minute via nasal cannula.   Assessment/Plan:     1. Acute hypoxemic respiratory failure-CT chest high-resolution obtained this morning shows bilateral pulmonary edema, inflammatory bronchitis.  Patient started on Zithromax and Rocephin.  BNP is mildly elevated to 227.0.  We will start DuoNeb every 6 hours, Solu-Medrol 60 mg IV every 6 hours, Mucinex 1 tablet p.o. twice daily, Pulmicort nebulizer twice a day. 2. Pulmonary edema-seen on CT chest, called discussed with nephrologist on-call Dr. Candiss Norse.  Patient does have history of CKD stage IV with creatinine 2.91 today.  Will give 1 dose of Lasix 40 mg IV and assess the response.  Follow BMP in am. 3. Acute kidney injury on CKD stage IV-patient baseline creatinine around 2.8, today creatinine is 2.99.    Will Follow BMP in am.  Avoid nephrotoxins. 4. Hyperkalemia-potassium is down to 5.3.  Patient was given 1 dose of Lokelma yesterday..  Follow BMP in am. 5. Hypertension-blood pressure stable, continue home medications. 6. Hypothyroidism-continue Synthroid.  TSH 4.416.     COVID-19 Labs  Recent Labs    02/05/20 1517  FERRITIN 35    Lab Results  Component Value Date   SARSCOV2NAA NEGATIVE 02/05/2020   [place] NEGATIVE 03/18/2019     Scheduled medications:   . amLODipine  10 mg Oral Daily  . aspirin EC  81 mg Oral Daily  . atorvastatin  20 mg Oral Daily  . budesonide (PULMICORT)  nebulizer solution  0.25 mg Nebulization BID  . Chlorhexidine Gluconate Cloth  6 each Topical Q0600  . cholecalciferol  10,000 Units Oral Daily  . famotidine  20 mg Oral Daily  . guaiFENesin  1,200 mg Oral BID  . heparin  5,000 Units Subcutaneous Q8H  . hydrALAZINE  25 mg Oral TID  . ipratropium-albuterol  3 mL Nebulization Q6H  . methylPREDNISolone (SOLU-MEDROL) injection  60 mg Intravenous Q6H  . multivitamin with minerals  1 tablet Oral Daily  . mupirocin ointment  1 application Nasal BID  . nystatin   Topical TID  . rOPINIRole  0.5 mg Oral QHS  . tamsulosin  0.4 mg Oral QPM  . thyroid  90 mg Oral QAC breakfast  . traZODone  50 mg Oral QHS         CBG: Recent Labs  Lab 02/06/20 0822 02/06/20 2116  GLUCAP 72 117*    SpO2: 97 % O2 Flow Rate (L/min): 2 L/min    CBC: Recent Labs  Lab 02/05/20 1517 02/06/20 0525 02/07/20 0555  WBC 9.6 8.7 8.1  NEUTROABS 7.1  --   --   HGB 9.8* 8.7* 8.3*  HCT 32.9* 28.9* 28.9*  MCV 104.8* 104.7* 106.6*  PLT 255 222 503    Basic Metabolic Panel: Recent Labs  Lab 02/05/20 1517 02/05/20 1800 02/06/20 0525 02/07/20 0555  NA 142  --  142 143  K 6.6* 5.8* 6.2* 5.3*  CL 114*  --  118* 116*  CO2 20*  --  20* 20*  GLUCOSE 166*  --  97 114*  BUN 36*  --  31* 30*  CREATININE 3.18*  --  2.85* 2.91*  CALCIUM 8.6*  --  8.2* 8.5*     Liver Function Tests: Recent Labs  Lab 02/06/20 0525  AST 11*  ALT 11  ALKPHOS 51  BILITOT 0.1*  PROT 5.3*  ALBUMIN 2.7*     Antibiotics: Anti-infectives (From admission, onward)   Start     Dose/Rate Route Frequency Ordered Stop   02/06/20 1730  cefTRIAXone (ROCEPHIN) 2 g in sodium chloride 0.9 % 100 mL IVPB        2 g 200 mL/hr over 30 Minutes Intravenous Every 24 hours 02/06/20 0015 02/11/20 2159   02/06/20 1730  azithromycin (ZITHROMAX) 500 mg in sodium chloride 0.9 % 250 mL IVPB        500 mg 250 mL/hr over 60 Minutes Intravenous Every 24 hours 02/06/20 0015 02/11/20 1959    02/05/20 1545  cefTRIAXone (ROCEPHIN) 1 g in sodium chloride 0.9 % 100 mL IVPB        1 g 200 mL/hr over 30 Minutes Intravenous  Once 02/05/20 1544 02/05/20 1800   02/05/20 1545  azithromycin (ZITHROMAX) 500 mg in sodium chloride 0.9 % 250 mL IVPB        500 mg 250 mL/hr over 60 Minutes Intravenous  Once 02/05/20 1544 02/05/20 1926       DVT prophylaxis: Heparin   Code Status: Full code  Family Communication: No family at bedside   Consultants:  Procedures:      Objective   Vitals:   02/06/20 2153 02/07/20 0240 02/07/20 0439 02/07/20 0622  BP:  (!) 159/91  (!) 138/45  Pulse:  71  68  Resp:  20  20  Temp:  98.3 F (36.8 C)  98.1 F (36.7 C)  TempSrc:  Oral  Oral  SpO2: 94% 92% 93% 97%  Weight:      Height:        Intake/Output Summary (Last 24 hours) at 02/07/2020 1230 Last data filed at 02/07/2020 0600 Gross per 24 hour  Intake 350 ml  Output 300 ml  Net 50 ml    11/23 1901 - 11/25 0700 In: 1100  Out: 300 [Urine:300]  Filed Weights   02/05/20 1432 02/05/20 2300  Weight: 63.5 kg 75.8 kg    Physical Examination:   General-appears in no acute distress  Heart-S1-S2, regular, no murmur auscultated  Lungs-clear to auscultation bilaterally, no wheezing or crackles auscultated  Abdomen-soft, nontender, no organomegaly  Extremities-no edema in the lower extremities  Neuro-alert, oriented x3, no focal deficit noted   Status is: Inpatient  Dispo: The patient is from: Home              Anticipated d/c is to: Assisted living facility              Anticipated d/c date is: ALF              Patient currently not medically stable for discharge  Barrier to discharge-ongoing shortness of breath      Data Reviewed:   Recent Results (from the past 240 hour(s))  Resp Panel by RT-PCR (Flu A&B, Covid) Nasopharyngeal Swab     Status: None   Collection Time: 02/05/20  3:45 PM   Specimen: Nasopharyngeal Swab; Nasopharyngeal(NP) swabs in vial transport  medium  Result Value Ref Range Status   SARS Coronavirus 2 by RT PCR NEGATIVE NEGATIVE Final    Comment: (NOTE) SARS-CoV-2 target nucleic acids  are NOT DETECTED.  The SARS-CoV-2 RNA is generally detectable in upper respiratory specimens during the acute phase of infection. The lowest concentration of SARS-CoV-2 viral copies this assay can detect is 138 copies/mL. A negative result does not preclude SARS-Cov-2 infection and should not be used as the sole basis for treatment or other patient management decisions. A negative result may occur with  improper specimen collection/handling, submission of specimen other than nasopharyngeal swab, presence of viral mutation(s) within the areas targeted by this assay, and inadequate number of viral copies(<138 copies/mL). A negative result must be combined with clinical observations, patient history, and epidemiological information. The expected result is Negative.  Fact Sheet for Patients:  EntrepreneurPulse.com.au  Fact Sheet for Healthcare Providers:  IncredibleEmployment.be  This test is no t yet approved or cleared by the [place] FDA and  has been authorized for detection and/or diagnosis of SARS-CoV-2 by FDA under an Emergency Use Authorization (EUA). This EUA will remain  in effect (meaning this test can be used) for the duration of the COVID-19 declaration under Section 564(b)(1) of the Act, 21 U.S.C.section 360bbb-3(b)(1), unless the authorization is terminated  or revoked sooner.       Influenza A by PCR NEGATIVE NEGATIVE Final   Influenza B by PCR NEGATIVE NEGATIVE Final    Comment: (NOTE) The Xpert Xpress SARS-CoV-2/FLU/RSV plus assay is intended as an aid in the diagnosis of influenza from Nasopharyngeal swab specimens and should not be used as a sole basis for treatment. Nasal washings and aspirates are unacceptable for Xpert Xpress SARS-CoV-2/FLU/RSV testing.  Fact Sheet for  Patients: EntrepreneurPulse.com.au  Fact Sheet for Healthcare Providers: IncredibleEmployment.be  This test is not yet approved or cleared by the [place] FDA and has been authorized for detection and/or diagnosis of SARS-CoV-2 by FDA under an Emergency Use Authorization (EUA). This EUA will remain in effect (meaning this test can be used) for the duration of the COVID-19 declaration under Section 564(b)(1) of the Act, 21 U.S.C. section 360bbb-3(b)(1), unless the authorization is terminated or revoked.  Performed at [facility], [address], [place], [place] [address]   Blood culture (routine x 2)     Status: None (Preliminary result)   Collection Time: 02/05/20  4:36 PM   Specimen: BLOOD RIGHT HAND  Result Value Ref Range Status   Specimen Description BLOOD RIGHT HAND  Final   Special Requests   Final    BOTTLES DRAWN AEROBIC AND ANAEROBIC Blood Culture adequate volume   Culture   Final    NO GROWTH 2 DAYS Performed at [facility], [address], [place], [place] [address]    Report Status PENDING  Incomplete  Blood culture (routine x 2)     Status: None (Preliminary result)   Collection Time: 02/05/20  4:37 PM   Specimen: BLOOD  Result Value Ref Range Status   Specimen Description BLOOD LEFT ANTECUBITAL  Final   Special Requests   Final    BOTTLES DRAWN AEROBIC AND ANAEROBIC Blood Culture results may not be optimal due to an inadequate volume of blood received in culture bottles   Culture   Final    NO GROWTH 2 DAYS Performed at [facility], [address] [place], [place] [address]    Report Status PENDING  Incomplete  MRSA PCR Screening     Status: Abnormal   Collection Time: 02/06/20 10:04 AM   Specimen: Nasal Mucosa; Nasopharyngeal  Result Value Ref Range Status   MRSA by PCR POSITIVE (A) NEGATIVE  Final    Comment:        The GeneXpert MRSA Assay (FDA approved for NASAL specimens only), is one component of  a comprehensive MRSA colonization surveillance program. It is not intended to diagnose MRSA infection nor to guide or monitor treatment for MRSA infections. RESULT CALLED TO, READ BACK BY AND VERIFIED WITH: K GRAVES,RN@[email or web address]  02/06/20 MKELLY Performed at [facility], [address] [place], [place] [address]     No results for input(s): LIPASE, AMYLASE in the last 168 hours. No results for input(s): AMMONIA in the last 168 hours.  Cardiac Enzymes: No results for input(s): CKTOTAL, CKMB, CKMBINDEX, TROPONINI in the last 168 hours. BNP (last 3 results) Recent Labs    03/18/19 1530 02/05/20 1517  BNP 225.0* 227.0*    ProBNP (last 3 results) No results for input(s): PROBNP in the last 8760 hours.  Studies:  CT Chest High Resolution  Result Date: 02/06/2020 CLINICAL DATA:  Respiratory failure, interstitial lung disease EXAM: CT CHEST WITHOUT CONTRAST TECHNIQUE: Multidetector CT imaging of the chest was performed following the standard protocol without intravenous contrast. High resolution imaging of the lungs, as well as inspiratory and expiratory imaging, was performed. COMPARISON:  None. FINDINGS: Cardiovascular: Aortic atherosclerosis. Cardiomegaly. Extensive 3 vessel coronary artery calcifications and/or stents. Trace pericardial effusion. Enlargement of the main pulmonary artery measuring up to 3.6 cm in caliber. Mediastinum/Nodes: Enlarged mediastinal lymph nodes, largest pretracheal nodes measuring up to 2.2 x 1.6 cm (series 4, image 48). Thyroid gland, trachea, and esophagus demonstrate no significant findings. Lungs/Pleura: Diffuse bilateral bronchial wall thickening. Mild centrilobular emphysema. Small bilateral pleural effusions and associated atelectasis or consolidation. Diffuse interlobular septal thickening. Bandlike scarring and or atelectasis of the left lower lobe and right middle lobe (series 7, image 169). No significant air trapping on expiratory phase imaging. Upper  Abdomen: No acute abnormality. Musculoskeletal: No chest wall mass or suspicious bone lesions identified. IMPRESSION: 1. Small bilateral pleural effusions and associated atelectasis or consolidation. 2. Diffuse interlobular septal thickening, consistent with pulmonary edema. 3. Diffuse bilateral bronchial wall thickening, nonspecific and may reflect infectious or inflammatory bronchitis or edema. 4. Bandlike scarring and or atelectasis of the left lower lobe and right middle lobe, most likely sequelae of prior infection. 5. No specific findings to suggest fibrotic interstitial lung disease, although assessment is limited given the presence of edema and effusions. 6. Cardiomegaly and coronary artery disease. 7. Enlargement of the main pulmonary artery, as can be seen in pulmonary hypertension. 8. Enlarged mediastinal lymph nodes, nonspecific and likely reactive. Aortic Atherosclerosis (ICD10-I70.0). Electronically Signed   By: Eddie Candle M.D.   On: 02/06/2020 13:56   DG Chest Port 1 View  Result Date: 02/05/2020 CLINICAL DATA:  Shortness of breath x1 month. EXAM: PORTABLE CHEST 1 VIEW COMPARISON:  March 21, 2019 FINDINGS: Moderate severity diffuse chronic appearing increased interstitial lung markings are seen. Mild atelectasis and/or infiltrate is seen within the lateral aspect of the left lung base. There is a small left pleural effusion. No pneumothorax is identified. The heart size and mediastinal contours are within normal limits. There is marked severity calcification of the aortic arch. Chronic left-sided rib fractures are seen. IMPRESSION: 1. Chronic interstitial lung disease with mild left basilar atelectasis and/or infiltrate. 2. Small left pleural effusion. Electronically Signed   By: Virgina Norfolk M.D.   On: 02/05/2020 15:43       [place]   [facility] If 7PM-7AM, please contact night-coverage at [email or web address], Office  [phone]  02/07/2020, 12:30 PM  LOS: 2 days

## 2020-02-07 NOTE — Progress Notes (Signed)
Positive for MRSA in nares, Dr. Darrick Meigs made aware. Notified daughter Jacquelyne Balint to make aware. MRSA protocol started.

## 2020-02-08 ENCOUNTER — Inpatient Hospital Stay (HOSPITAL_COMMUNITY): Payer: Medicare Other

## 2020-02-08 DIAGNOSIS — E875 Hyperkalemia: Secondary | ICD-10-CM | POA: Diagnosis not present

## 2020-02-08 DIAGNOSIS — E039 Hypothyroidism, unspecified: Secondary | ICD-10-CM | POA: Diagnosis not present

## 2020-02-08 DIAGNOSIS — J189 Pneumonia, unspecified organism: Secondary | ICD-10-CM | POA: Diagnosis not present

## 2020-02-08 DIAGNOSIS — N179 Acute kidney failure, unspecified: Secondary | ICD-10-CM | POA: Diagnosis not present

## 2020-02-08 LAB — IRON AND TIBC
Iron: 41 ug/dL (ref 28–170)
Saturation Ratios: 20 % (ref 10.4–31.8)
TIBC: 206 ug/dL — ABNORMAL LOW (ref 250–450)
UIBC: 165 ug/dL

## 2020-02-08 LAB — BASIC METABOLIC PANEL
Anion gap: 8 (ref 5–15)
BUN: 40 mg/dL — ABNORMAL HIGH (ref 8–23)
CO2: 20 mmol/L — ABNORMAL LOW (ref 22–32)
Calcium: 8.4 mg/dL — ABNORMAL LOW (ref 8.9–10.3)
Chloride: 111 mmol/L (ref 98–111)
Creatinine, Ser: 3.35 mg/dL — ABNORMAL HIGH (ref 0.44–1.00)
GFR, Estimated: 13 mL/min — ABNORMAL LOW (ref >60)
Glucose, Bld: 272 mg/dL — ABNORMAL HIGH (ref 70–99)
Potassium: 5.7 mmol/L — ABNORMAL HIGH (ref 3.5–5.1)
Sodium: 139 mmol/L (ref 135–145)

## 2020-02-08 LAB — VITAMIN B12: Vitamin B-12: 208 pg/mL (ref 180–914)

## 2020-02-08 LAB — FERRITIN: Ferritin: 60 ng/mL (ref 11–307)

## 2020-02-08 MED ORDER — SODIUM ZIRCONIUM CYCLOSILICATE 10 G PO PACK
10.0000 g | PACK | Freq: Every day | ORAL | Status: AC
  Administered 2020-02-08 – 2020-02-10 (×3): 10 g via ORAL
  Filled 2020-02-08 (×3): qty 1

## 2020-02-08 NOTE — Consult Note (Signed)
Reason for Consult: AKI/CKD stage IV Referring Physician: Darrick Meigs, MD  Regina Bush is an 86 y.o. female with an extensive PMH significant for DM, HTN, CVA, CAD, diastolic dysfunction, dementia, FTT,severe protein malnutrition, and CKD stage IV who presented to [facility] ED via EMS from [facility] with worsening SOB and DOE.  In the ED she was hypoxic at 91% and started on oxygen via [place].  CXR revealed chornic interstitial lung disease with small left pleural effusion.  She was admitted for further evaluation.  High resolution CT scan performed on 02/06/20 revealed bilateral pleural effusions, diffuse interlobular septal thickening consistent with pulmonary edema, diffuse bilateral bronchial wall thickening, cardiomegaly and CAD, enlargement of pulmonary artery.  She was given IV lasix with symptomatic improvement of her SOB but worsening renal function.  We were consulted to further evaluate and manage her AKI/CKD stage IV.  Her underlying dementia makes it difficult for a full history and most of the information was obtained from reviewing her existing EMR.  She currently reports that she is feeling better.  Denies any N/V/D, SOB, productive cough, or chest pain.  The trend in Scr is seen below.  Trimethoprim is on her home medlist but she cannot tell me if she is taking it or not.   Trend in Creatinine: Creatinine, Ser  Date/Time Value Ref Range Status  02/08/2020 06:37 AM 3.35 (H) 0.44 - 1.00 mg/dL Final  02/07/2020 05:55 AM 2.91 (H) 0.44 - 1.00 mg/dL Final  02/06/2020 05:25 AM 2.85 (H) 0.44 - 1.00 mg/dL Final  02/05/2020 03:17 PM 3.18 (H) 0.44 - 1.00 mg/dL Final  12/04/2019 11:41 AM 2.82 (H) 0.60 - 0.88 mg/dL Final  08/28/2019 10:30 AM 2.50 (H) 0.60 - 0.88 mg/dL Final  05/23/2019 10:53 AM 2.06 (H) 0.60 - 0.88 mg/dL Final  03/22/2019 06:10 AM 1.73 (H) 0.44 - 1.00 mg/dL Final  03/21/2019 09:43 AM 1.74 (H) 0.44 - 1.00 mg/dL Final  03/20/2019 02:32 PM 1.95 (H) 0.44 - 1.00 mg/dL  Final  03/20/2019 05:41 AM 2.02 (H) 0.44 - 1.00 mg/dL Final  03/19/2019 07:50 PM 2.03 (H) 0.44 - 1.00 mg/dL Final  03/18/2019 03:30 PM 2.27 (H) 0.44 - 1.00 mg/dL Final  05/15/2018 02:26 PM 1.72 (H) 0.44 - 1.00 mg/dL Final  05/04/2018 02:09 PM 1.48 (H) 0.44 - 1.00 mg/dL Final  03/14/2018 12:45 PM 2.07 (H) 0.44 - 1.00 mg/dL Final  02/27/2018 07:00 AM 1.36 (H) 0.44 - 1.00 mg/dL Final  02/20/2018 05:30 AM 1.96 (H) 0.44 - 1.00 mg/dL Final  02/15/2018 05:43 AM 1.17 (H) 0.44 - 1.00 mg/dL Final  02/15/2018 05:43 AM 1.22 (H) 0.44 - 1.00 mg/dL Final  02/13/2018 05:05 AM 1.52 (H) 0.44 - 1.00 mg/dL Final  02/10/2018 12:07 PM 1.60 (H) 0.44 - 1.00 mg/dL Final  02/10/2018 06:31 AM 1.50 (H) 0.44 - 1.00 mg/dL Final  02/08/2018 05:30 AM 1.43 (H) 0.44 - 1.00 mg/dL Final  02/06/2018 08:30 AM 1.54 (H) 0.44 - 1.00 mg/dL Final  02/06/2018 05:54 AM 1.50 (H) 0.44 - 1.00 mg/dL Final  02/04/2018 07:01 AM 1.75 (H) 0.44 - 1.00 mg/dL Final  02/03/2018 07:15 PM 2.01 (H) 0.44 - 1.00 mg/dL Final  11/29/2016 06:50 AM 1.17 (H) 0.44 - 1.00 mg/dL Final  11/20/2016 10:53 AM 1.17 (H) 0.44 - 1.00 mg/dL Final  11/16/2016 04:37 AM 1.19 (H) 0.44 - 1.00 mg/dL Final  11/15/2016 06:49 PM 1.74 (H) 0.44 - 1.00 mg/dL Final  08/25/2016 04:55 AM 1.28 (H) 0.44 - 1.00 mg/dL Final  08/23/2016 05:33 AM  1.17 (H) 0.44 - 1.00 mg/dL Final  08/22/2016 06:35 AM 1.41 (H) 0.44 - 1.00 mg/dL Final  08/20/2016 05:50 AM 1.34 (H) 0.44 - 1.00 mg/dL Final  08/19/2016 05:04 PM 1.45 (H) 0.44 - 1.00 mg/dL Final  08/09/2016 04:02 PM 1.58 (H) 0.44 - 1.00 mg/dL Final  04/23/2016 07:00 AM 1.72 (H) 0.44 - 1.00 mg/dL Final    PMH:   Past Medical History:  Diagnosis Date  . Anemia   . Arthritis   . Cerebral infarction ([place])   . Coronary artery disease   . Dementia ([place])   . Diabetes mellitus without complication (HCC)    Type 2  . Dysphagia due to recent cerebral infarction   . Failure to thrive (0-17)   . Hypercalcemia   . Hypertension   .  Pneumonitis   . Protein calorie malnutrition ([place])   . Subdural hematoma ([place])   . Thyroid disease     PSH:  History reviewed. No pertinent surgical history.  Allergies:  Allergies  Allergen Reactions  . Cymbalta [Duloxetine Hcl] Other (See Comments)    unknown  . Effexor [Venlafaxine] Other (See Comments)    unknown  . Morphine And Related Other (See Comments)    Passed out   . Tape     adhesive    Medications:   Prior to Admission medications   Medication Sig Start Date End Date Taking? Authorizing Provider  acetaminophen (TYLENOL) 325 MG tablet Take 2 tablets (650 mg total) by mouth every 6 (six) hours as needed for mild pain or moderate pain. 08/25/16  Yes Rosita Fire, MD  amLODipine (NORVASC) 10 MG tablet Take 10 mg by mouth daily.    Yes [provider]  aspirin EC 81 MG tablet Take 81 mg by mouth daily.   Yes [provider]  atorvastatin (LIPITOR) 20 MG tablet Take 20 mg by mouth daily.    Yes [provider]  cetirizine (ZYRTEC) 10 MG tablet Take 10 mg by mouth at bedtime as needed for allergies.   Yes [provider]  Cholecalciferol (VITAMIN D3) 125 MCG (5000 UT) TABS Take 2 tablets (10,000 Units total) by mouth daily. 05/24/19  Yes Gosrani, Nimish C, MD  famotidine (PEPCID) 20 MG tablet Take 20 mg by mouth daily.   Yes [provider]  fluticasone (FLONASE) 50 MCG/ACT nasal spray Place 2 sprays into both nostrils daily.   Yes [provider]  hydrALAZINE (APRESOLINE) 25 MG tablet Take 25 mg by mouth 3 (three) times daily. Give 1 tablet by mouth three times a day for HTN hold for systolic BP <462   Yes [provider]  magnesium oxide (MAG-OX) 400 MG tablet Take 400 mg by mouth daily.    Yes [provider]  Multiple Vitamin (MULTIVITAMIN WITH MINERALS) TABS tablet Take 1 tablet by mouth daily.    Yes [provider]  NP THYROID 90 MG tablet Take 1 tablet (90 mg total) by mouth daily.  12/05/19  Yes Gosrani, Nimish C, MD  rOPINIRole (REQUIP) 0.5 MG tablet Take 1 tablet (0.5 mg total) by mouth at bedtime. 12/24/19  Yes Gosrani, Nimish C, MD  tamsulosin (FLOMAX) 0.4 MG CAPS capsule Take 0.4 mg by mouth every evening.   Yes [provider]  traZODone (DESYREL) 50 MG tablet Take 50 mg by mouth at bedtime.   Yes [provider]  trimethoprim (TRIMPEX) 100 MG tablet Take 1 tablet (100 mg total) by mouth daily. 03/26/19  Yes Johnson, Clanford L,  MD  Wheat Dextrin (BENEFIBER PO) Take 10 mLs by mouth daily.    Yes [provider]    Inpatient medications: . amLODipine  10 mg Oral Daily  . aspirin EC  81 mg Oral Daily  . atorvastatin  20 mg Oral Daily  . budesonide (PULMICORT) nebulizer solution  0.25 mg Nebulization BID  . Chlorhexidine Gluconate Cloth  6 each Topical Q0600  . cholecalciferol  10,000 Units Oral Daily  . famotidine  20 mg Oral Daily  . guaiFENesin  1,200 mg Oral BID  . heparin  5,000 Units Subcutaneous Q8H  . hydrALAZINE  25 mg Oral TID  . ipratropium-albuterol  3 mL Nebulization BID  . methylPREDNISolone (SOLU-MEDROL) injection  60 mg Intravenous Q6H  . multivitamin with minerals  1 tablet Oral Daily  . mupirocin ointment  1 application Nasal BID  . nystatin   Topical TID  . rOPINIRole  0.5 mg Oral QHS  . sodium zirconium cyclosilicate  10 g Oral Daily  . tamsulosin  0.4 mg Oral QPM  . thyroid  90 mg Oral QAC breakfast  . traZODone  50 mg Oral QHS    Discontinued Meds:   Medications Discontinued During This Encounter  Medication Reason  . Cholecalciferol (VITAMIN D3) 125 MCG (5000 UT) TABS Patient Preference  . ibuprofen (ADVIL) 800 MG tablet No longer needed (for PRN medications)  . cefUROXime (CEFTIN) 250 MG tablet Completed Course  . QUEtiapine (SEROQUEL) 100 MG tablet Discontinued by provider  . miconazole (MICOTIN) 2 % cream Completed Course  . levothyroxine (SYNTHROID) 100 MCG tablet Discontinued by provider  .  QUEtiapine (SEROQUEL) tablet 100 mg Inpatient Standard  . levothyroxine (SYNTHROID) tablet 88 mcg Inpatient Standard  . albuterol (PROVENTIL) (2.5 MG/3ML) 0.083% nebulizer solution 2.5 mg   . ipratropium-albuterol (DUONEB) 0.5-2.5 (3) MG/3ML nebulizer solution 3 mL     Social History:  reports that she has never smoked. She has never used smokeless tobacco. She reports that she does not drink alcohol and does not use drugs.  Family History:   Family History  Family history unknown: Yes    Pertinent items are noted in HPI. Weight change:   Intake/Output Summary (Last 24 hours) at 02/08/2020 0954 Last data filed at 02/08/2020 0910 Gross per 24 hour  Intake 720 ml  Output 100 ml  Net 620 ml   BP (!) 165/52 (BP Location: Right Arm)   Pulse 69   Temp 98.5 F (36.9 C) (Oral)   Resp 20   Ht 5\' 3"  (1.6 m)   Wt 75.8 kg   SpO2 100%   BMI 29.60 kg/m  Vitals:   02/07/20 2040 02/08/20 0548 02/08/20 0815 02/08/20 0821  BP:  (!) 165/52    Pulse:  69    Resp:  20    Temp:  98.5 F (36.9 C)    TempSrc:  Oral    SpO2: 94% 98% 97% 100%  Weight:      Height:         General appearance: alert, no distress and pleasantly demented Head: Normocephalic, without obvious abnormality, atraumatic Eyes: negative findings: lids and lashes normal, conjunctivae and sclerae normal and corneas clear Resp: rales bibasilar and dry crackles, rhonchi bilaterally and wheezes bilaterally Cardio: regular rate and rhythm and no rub GI: soft, non-tender; bowel sounds normal; no masses,  no organomegaly Extremities: extremities normal, atraumatic, no cyanosis or edema  Labs: Basic Metabolic Panel: Recent Labs  Lab 02/05/20 1517 02/05/20 1800 02/06/20 0525 02/07/20 0555  02/08/20 0637  NA 142  --  142 143 139  K 6.6* 5.8* 6.2* 5.3* 5.7*  CL 114*  --  118* 116* 111  CO2 20*  --  20* 20* 20*  GLUCOSE 166*  --  97 114* 272*  BUN 36*  --  31* 30* 40*  CREATININE 3.18*  --  2.85* 2.91* 3.35*   ALBUMIN  --   --  2.7*  --   --   CALCIUM 8.6*  --  8.2* 8.5* 8.4*   Liver Function Tests: Recent Labs  Lab 02/06/20 0525  AST 11*  ALT 11  ALKPHOS 51  BILITOT 0.1*  PROT 5.3*  ALBUMIN 2.7*   No results for input(s): LIPASE, AMYLASE in the last 168 hours. No results for input(s): AMMONIA in the last 168 hours. CBC: Recent Labs  Lab 02/05/20 1517 02/06/20 0525 02/07/20 0555  WBC 9.6 8.7 8.1  NEUTROABS 7.1  --   --   HGB 9.8* 8.7* 8.3*  HCT 32.9* 28.9* 28.9*  MCV 104.8* 104.7* 106.6*  PLT 255 222 204   PT/INR: @LABRCNTIP (inr:5) Cardiac Enzymes: )No results for input(s): CKTOTAL, CKMB, CKMBINDEX, TROPONINI in the last 168 hours. CBG: Recent Labs  Lab 02/06/20 0822 02/06/20 2116  GLUCAP 72 117*    Iron Studies:  Recent Labs  Lab 02/05/20 1517  IRON 25*  TIBC 245*  FERRITIN 35    Xrays/Other Studies: CT Chest High Resolution  Result Date: 02/06/2020 CLINICAL DATA:  Respiratory failure, interstitial lung disease EXAM: CT CHEST WITHOUT CONTRAST TECHNIQUE: Multidetector CT imaging of the chest was performed following the standard protocol without intravenous contrast. High resolution imaging of the lungs, as well as inspiratory and expiratory imaging, was performed. COMPARISON:  None. FINDINGS: Cardiovascular: Aortic atherosclerosis. Cardiomegaly. Extensive 3 vessel coronary artery calcifications and/or stents. Trace pericardial effusion. Enlargement of the main pulmonary artery measuring up to 3.6 cm in caliber. Mediastinum/Nodes: Enlarged mediastinal lymph nodes, largest pretracheal nodes measuring up to 2.2 x 1.6 cm (series 4, image 48). Thyroid gland, trachea, and esophagus demonstrate no significant findings. Lungs/Pleura: Diffuse bilateral bronchial wall thickening. Mild centrilobular emphysema. Small bilateral pleural effusions and associated atelectasis or consolidation. Diffuse interlobular septal thickening. Bandlike scarring and or atelectasis of the left  lower lobe and right middle lobe (series 7, image 169). No significant air trapping on expiratory phase imaging. Upper Abdomen: No acute abnormality. Musculoskeletal: No chest wall mass or suspicious bone lesions identified. IMPRESSION: 1. Small bilateral pleural effusions and associated atelectasis or consolidation. 2. Diffuse interlobular septal thickening, consistent with pulmonary edema. 3. Diffuse bilateral bronchial wall thickening, nonspecific and may reflect infectious or inflammatory bronchitis or edema. 4. Bandlike scarring and or atelectasis of the left lower lobe and right middle lobe, most likely sequelae of prior infection. 5. No specific findings to suggest fibrotic interstitial lung disease, although assessment is limited given the presence of edema and effusions. 6. Cardiomegaly and coronary artery disease. 7. Enlargement of the main pulmonary artery, as can be seen in pulmonary hypertension. 8. Enlarged mediastinal lymph nodes, nonspecific and likely reactive. Aortic Atherosclerosis (ICD10-I70.0). Electronically Signed   By: Eddie Candle M.D.   On: 02/06/2020 13:56     Assessment/Plan: 1.  AKI/CKD stage IV- in setting of IV diuresis +/- trimethoprim which is on medlist but pt cannot tell me what she takes at home.  Likely cardiorenal syndrome vs progressive CKD.  She is not a candidate for dialysis given her advanced age, multiple co-morbidities (including dementia), and poor functional  and nutritional status.  Recommend conservative management as well as Palliative Care consult to help set goals/limits of care (she is full code). 2. Hyperkalemia- increase lokelma to 10 grams daily and follow 3. Acute hypoxic respiratory failure- likely combination of interstitial lung disease and pulmonary edema.  Did not respond to IV lasix and would increase dose to 80 mg IV and follow. 4. Anemia of CKD stage IV.  Will check iron stores and follow. 5. HTN- stable for now 6. DM type 2- per  primary 7. Dementia 8. Failure to thrive 9. Severe protein malnutrition- will check SPEP/UPEP as well. 10. Disposition - poor overall prognosis and recommend palliative care consult to help set goals/limits of care.  Conservative management for now.   Governor Rooks Regina Bush 02/08/2020, 9:54 AM

## 2020-02-08 NOTE — Plan of Care (Signed)

## 2020-02-08 NOTE — Progress Notes (Signed)
[facility]  PROGRESS NOTE  Regina Bush WGN:[id number] DOB: 05-23-1927 DOA: 02/05/2020 PCP: Doree Albee, MD   Brief HPI:   86 year old female with a history of thyroid disease, SDH, protein calorie malnutrition, hypertension, failure to thrive, diabetes mellitus or complication, CAD, CVA came with dyspnea    Subjective   Patient seen and examined, breathing little bit improved since yesterday.  She was given IV Lasix 40 mg x 1 with no significant improvement in diuresis.  Renal function is worse today.   Assessment/Plan:     1. Acute hypoxemic respiratory failure-slightly improved, CT chest high-resolution obtained this morning shows bilateral pulmonary edema, inflammatory bronchitis.  Patient started on Zithromax and Rocephin.  BNP is mildly elevated to 227.0.  Continue  DuoNeb every 6 hours, Solu-Medrol 60 mg IV every 6 hours, Mucinex 1 tablet p.o. twice daily, Pulmicort nebulizer twice a day. 2. Pulmonary edema-seen on CT chest, called discussed with nephrologist on-call Dr. Candiss Norse.  Patient does have history of CKD stage IV with creatinine 2.91 today.  She was given 1 dose of Lasix 40 mg IV with no significant diuretic response, her breathing seems to have improved. 3. Acute kidney injury on CKD stage IV-patient baseline creatinine around 2.8, today creatinine is 2.99.    Will Follow BMP in am.  Avoid nephrotoxins.  Nephrology consulted. 4. Hyperkalemia-potassium is 5.7.  Lokelma has been ordered by nephrology.  Follow BMP in am. 5. Hypertension-blood pressure stable, continue home medications. 6. Hypothyroidism-continue Synthroid.  TSH 4.416.     COVID-19 Labs  Recent Labs    02/05/20 1517 02/08/20 1145  FERRITIN 35 60    Lab Results  Component Value Date   SARSCOV2NAA NEGATIVE 02/05/2020   [place] NEGATIVE 03/18/2019     Scheduled medications:   . amLODipine  10 mg Oral Daily  . aspirin EC  81 mg Oral Daily  . atorvastatin  20 mg Oral Daily  .  budesonide (PULMICORT) nebulizer solution  0.25 mg Nebulization BID  . Chlorhexidine Gluconate Cloth  6 each Topical Q0600  . cholecalciferol  10,000 Units Oral Daily  . famotidine  20 mg Oral Daily  . guaiFENesin  1,200 mg Oral BID  . heparin  5,000 Units Subcutaneous Q8H  . hydrALAZINE  25 mg Oral TID  . ipratropium-albuterol  3 mL Nebulization BID  . methylPREDNISolone (SOLU-MEDROL) injection  60 mg Intravenous Q6H  . multivitamin with minerals  1 tablet Oral Daily  . mupirocin ointment  1 application Nasal BID  . nystatin   Topical TID  . rOPINIRole  0.5 mg Oral QHS  . sodium zirconium cyclosilicate  10 g Oral Daily  . tamsulosin  0.4 mg Oral QPM  . thyroid  90 mg Oral QAC breakfast  . traZODone  50 mg Oral QHS         CBG: Recent Labs  Lab 02/06/20 0822 02/06/20 2116  GLUCAP 72 117*    SpO2: 97 % O2 Flow Rate (L/min): 2 L/min    CBC: Recent Labs  Lab 02/05/20 1517 02/06/20 0525 02/07/20 0555  WBC 9.6 8.7 8.1  NEUTROABS 7.1  --   --   HGB 9.8* 8.7* 8.3*  HCT 32.9* 28.9* 28.9*  MCV 104.8* 104.7* 106.6*  PLT 255 222 784    Basic Metabolic Panel: Recent Labs  Lab 02/05/20 1517 02/05/20 1800 02/06/20 0525 02/07/20 0555 02/08/20 0637  NA 142  --  142 143 139  K 6.6* 5.8* 6.2* 5.3* 5.7*  CL 114*  --  118* 116* 111  CO2 20*  --  20* 20* 20*  GLUCOSE 166*  --  97 114* 272*  BUN 36*  --  31* 30* 40*  CREATININE 3.18*  --  2.85* 2.91* 3.35*  CALCIUM 8.6*  --  8.2* 8.5* 8.4*     Liver Function Tests: Recent Labs  Lab 02/06/20 0525  AST 11*  ALT 11  ALKPHOS 51  BILITOT 0.1*  PROT 5.3*  ALBUMIN 2.7*     Antibiotics: Anti-infectives (From admission, onward)   Start     Dose/Rate Route Frequency Ordered Stop   02/06/20 1730  cefTRIAXone (ROCEPHIN) 2 g in sodium chloride 0.9 % 100 mL IVPB        2 g 200 mL/hr over 30 Minutes Intravenous Every 24 hours 02/06/20 0015 02/11/20 2159   02/06/20 1730  azithromycin (ZITHROMAX) 500 mg in sodium  chloride 0.9 % 250 mL IVPB        500 mg 250 mL/hr over 60 Minutes Intravenous Every 24 hours 02/06/20 0015 02/11/20 1959   02/05/20 1545  cefTRIAXone (ROCEPHIN) 1 g in sodium chloride 0.9 % 100 mL IVPB        1 g 200 mL/hr over 30 Minutes Intravenous  Once 02/05/20 1544 02/05/20 1800   02/05/20 1545  azithromycin (ZITHROMAX) 500 mg in sodium chloride 0.9 % 250 mL IVPB        500 mg 250 mL/hr over 60 Minutes Intravenous  Once 02/05/20 1544 02/05/20 1926       DVT prophylaxis: Heparin   Code Status: Full code  Family Communication: No family at bedside   Consultants:  Procedures:      Objective   Vitals:   02/08/20 0548 02/08/20 0815 02/08/20 0821 02/08/20 1337  BP: (!) 165/52   (!) 165/46  Pulse: 69   77  Resp: 20   16  Temp: 98.5 F (36.9 C)   98.3 F (36.8 C)  TempSrc: Oral   Oral  SpO2: 98% 97% 100% 97%  Weight:      Height:        Intake/Output Summary (Last 24 hours) at 02/08/2020 1449 Last data filed at 02/08/2020 6283 Gross per 24 hour  Intake 480 ml  Output 100 ml  Net 380 ml    11/24 1901 - 11/26 0700 In: 1517 [P.O.:840] Out: 400 [Urine:400]  Filed Weights   02/05/20 1432 02/05/20 2300  Weight: 63.5 kg 75.8 kg    Physical Examination:  General-appears in no acute distress Heart-S1-S2, regular, no murmur auscultated Lungs-decreased breath sounds bilaterally Abdomen-soft, nontender, no organomegaly Extremities-no edema in the lower extremities Neuro-alert, oriented x3, no focal deficit noted  Status is: Inpatient  Dispo: The patient is from: Home              Anticipated d/c is to: Assisted living facility              Anticipated d/c date is: ALF              Patient currently not medically stable for discharge  Barrier to discharge-ongoing shortness of breath      Data Reviewed:   Recent Results (from the past 240 hour(s))  Resp Panel by RT-PCR (Flu A&B, Covid) Nasopharyngeal Swab     Status: None   Collection Time:  02/05/20  3:45 PM   Specimen: Nasopharyngeal Swab; Nasopharyngeal(NP) swabs in vial transport medium  Result Value Ref Range Status   SARS Coronavirus 2 by RT PCR NEGATIVE NEGATIVE  Final    Comment: (NOTE) SARS-CoV-2 target nucleic acids are NOT DETECTED.  The SARS-CoV-2 RNA is generally detectable in upper respiratory specimens during the acute phase of infection. The lowest concentration of SARS-CoV-2 viral copies this assay can detect is 138 copies/mL. A negative result does not preclude SARS-Cov-2 infection and should not be used as the sole basis for treatment or other patient management decisions. A negative result may occur with  improper specimen collection/handling, submission of specimen other than nasopharyngeal swab, presence of viral mutation(s) within the areas targeted by this assay, and inadequate number of viral copies(<138 copies/mL). A negative result must be combined with clinical observations, patient history, and epidemiological information. The expected result is Negative.  Fact Sheet for Patients:  EntrepreneurPulse.com.au  Fact Sheet for Healthcare Providers:  IncredibleEmployment.be  This test is no t yet approved or cleared by the [place] FDA and  has been authorized for detection and/or diagnosis of SARS-CoV-2 by FDA under an Emergency Use Authorization (EUA). This EUA will remain  in effect (meaning this test can be used) for the duration of the COVID-19 declaration under Section 564(b)(1) of the Act, 21 U.S.C.section 360bbb-3(b)(1), unless the authorization is terminated  or revoked sooner.       Influenza A by PCR NEGATIVE NEGATIVE Final   Influenza B by PCR NEGATIVE NEGATIVE Final    Comment: (NOTE) The Xpert Xpress SARS-CoV-2/FLU/RSV plus assay is intended as an aid in the diagnosis of influenza from Nasopharyngeal swab specimens and should not be used as a sole basis for treatment. Nasal washings  and aspirates are unacceptable for Xpert Xpress SARS-CoV-2/FLU/RSV testing.  Fact Sheet for Patients: EntrepreneurPulse.com.au  Fact Sheet for Healthcare Providers: IncredibleEmployment.be  This test is not yet approved or cleared by the [place] FDA and has been authorized for detection and/or diagnosis of SARS-CoV-2 by FDA under an Emergency Use Authorization (EUA). This EUA will remain in effect (meaning this test can be used) for the duration of the COVID-19 declaration under Section 564(b)(1) of the Act, 21 U.S.C. section 360bbb-3(b)(1), unless the authorization is terminated or revoked.  Performed at [facility], [address] [place], [place] [address]   Blood culture (routine x 2)     Status: None (Preliminary result)   Collection Time: 02/05/20  4:36 PM   Specimen: BLOOD RIGHT HAND  Result Value Ref Range Status   Specimen Description BLOOD RIGHT HAND  Final   Special Requests   Final    BOTTLES DRAWN AEROBIC AND ANAEROBIC Blood Culture adequate volume   Culture   Final    NO GROWTH 3 DAYS Performed at [facility], [address], [place], [place] [address]    Report Status PENDING  Incomplete  Blood culture (routine x 2)     Status: None (Preliminary result)   Collection Time: 02/05/20  4:37 PM   Specimen: BLOOD  Result Value Ref Range Status   Specimen Description BLOOD LEFT ANTECUBITAL  Final   Special Requests   Final    BOTTLES DRAWN AEROBIC AND ANAEROBIC Blood Culture results may not be optimal due to an inadequate volume of blood received in culture bottles   Culture   Final    NO GROWTH 3 DAYS Performed at [facility], [address], [place], [place] [address]    Report Status PENDING  Incomplete  MRSA PCR Screening     Status: Abnormal   Collection Time: 02/06/20 10:04 AM   Specimen: Nasal Mucosa; Nasopharyngeal  Result Value Ref  Range Status   MRSA by PCR POSITIVE (A) NEGATIVE Final    Comment:        The  GeneXpert MRSA Assay (FDA approved for NASAL specimens only), is one component of a comprehensive MRSA colonization surveillance program. It is not intended to diagnose MRSA infection nor to guide or monitor treatment for MRSA infections. RESULT CALLED TO, READ BACK BY AND VERIFIED WITH: K GRAVES,RN@[id number]  02/06/20 MKELLY Performed at [facility], [address] [place], [place] [address]     No results for input(s): LIPASE, AMYLASE in the last 168 hours. No results for input(s): AMMONIA in the last 168 hours.  Cardiac Enzymes: No results for input(s): CKTOTAL, CKMB, CKMBINDEX, TROPONINI in the last 168 hours. BNP (last 3 results) Recent Labs    03/18/19 1530 02/05/20 1517  BNP 225.0* 227.0*    ProBNP (last 3 results) No results for input(s): PROBNP in the last 8760 hours.  Studies:  US RENAL  Result Date: 2020/03/03 CLINICAL DATA:  Acute renal injury. EXAM: RENAL / URINARY TRACT ULTRASOUND COMPLETE COMPARISON:  Ultrasound 02/15/2018. FINDINGS: Right Kidney: Renal measurements: 8.5 x 4.0 x 3.9 cm = volume: 70 mL. Renal cortical thinning. Echogenicity within normal limits. 3.1 cm simple cyst. No hydronephrosis visualized. Left Kidney: Renal measurements: 11.2 x 4.7 x 4.5 cm = volume: 124 mL. Renal cortical thinning. Echogenicity within normal limits. 2.1 cm simple cyst. Hydronephrosis visualized. Bladder: Appears normal for degree of bladder distention. Other: None. IMPRESSION: 1. No acute abnormality. No hydronephrosis or bladder distention. 2. Bilateral simple renal cysts. Bilateral renal cortical thinning. Right kidney is slightly atrophic. Electronically Signed   By: Marcello Moores  Register   On: 03-03-20 12:09       Oswald Hillock   [facility] If 7PM-7AM, please contact night-coverage at [email or web address], Office  [phone]   03/03/2020, 2:49 PM  LOS: 3 days

## 2020-02-09 DIAGNOSIS — E039 Hypothyroidism, unspecified: Secondary | ICD-10-CM | POA: Diagnosis not present

## 2020-02-09 DIAGNOSIS — E875 Hyperkalemia: Secondary | ICD-10-CM | POA: Diagnosis not present

## 2020-02-09 DIAGNOSIS — N179 Acute kidney failure, unspecified: Secondary | ICD-10-CM | POA: Diagnosis not present

## 2020-02-09 DIAGNOSIS — J189 Pneumonia, unspecified organism: Secondary | ICD-10-CM | POA: Diagnosis not present

## 2020-02-09 LAB — CBC
HCT: 27.5 % — ABNORMAL LOW (ref 36.0–46.0)
Hemoglobin: 8.5 g/dL — ABNORMAL LOW (ref 12.0–15.0)
MCH: 31.6 pg (ref 26.0–34.0)
MCHC: 30.9 g/dL (ref 30.0–36.0)
MCV: 102.2 fL — ABNORMAL HIGH (ref 80.0–100.0)
Platelets: 207 10*3/uL (ref 150–400)
RBC: 2.69 MIL/uL — ABNORMAL LOW (ref 3.87–5.11)
RDW: 12.9 % (ref 11.5–15.5)
WBC: 14.3 10*3/uL — ABNORMAL HIGH (ref 4.0–10.5)
nRBC: 0 % (ref 0.0–0.2)

## 2020-02-09 LAB — RENAL FUNCTION PANEL
Albumin: 2.7 g/dL — ABNORMAL LOW (ref 3.5–5.0)
Anion gap: 8 (ref 5–15)
BUN: 54 mg/dL — ABNORMAL HIGH (ref 8–23)
CO2: 19 mmol/L — ABNORMAL LOW (ref 22–32)
Calcium: 8.4 mg/dL — ABNORMAL LOW (ref 8.9–10.3)
Chloride: 108 mmol/L (ref 98–111)
Creatinine, Ser: 3.2 mg/dL — ABNORMAL HIGH (ref 0.44–1.00)
GFR, Estimated: 13 mL/min — ABNORMAL LOW (ref >60)
Glucose, Bld: 266 mg/dL — ABNORMAL HIGH (ref 70–99)
Phosphorus: 5.1 mg/dL — ABNORMAL HIGH (ref 2.5–4.6)
Potassium: 4.7 mmol/L (ref 3.5–5.1)
Sodium: 135 mmol/L (ref 135–145)

## 2020-02-09 NOTE — Progress Notes (Signed)
[facility]  PROGRESS NOTE  Regina Bush XYV:[id number] DOB: 05-Dec-1927 DOA: 02/05/2020 PCP: Doree Albee, MD   Brief HPI:   86 year old female with a history of thyroid disease, SDH, protein calorie malnutrition, hypertension, failure to thrive, diabetes mellitus or complication, CAD, CVA came with dyspnea    Subjective   Patient seen and examined, denies shortness of breath.   Assessment/Plan:     1. Acute hypoxemic respiratory failure-slightly improved, CT chest high-resolution obtained this morning shows bilateral pulmonary edema, inflammatory bronchitis.  Patient started on Zithromax and Rocephin.  BNP is mildly elevated to 227.0.  Continue  DuoNeb every 6 hours, Solu-Medrol 60 mg IV every 6 hours, Mucinex 1 tablet p.o. twice daily, Pulmicort nebulizer twice a day. 2. Grade 1 diastolic dysfunction/pulmonary edema-seen on CT chest, echocardiogram from 2019 showed grade 1 diastolic dysfunction.  Called discussed with nephrologist on-call Dr. Candiss Norse.  Patient does have history of CKD stage IV with baseline creatinine 2.91 today.  She was given 1 dose of Lasix 40 mg IV with no significant diuretic response, her breathing seems to have improved.  Will avoid giving further diuretics due to worsening renal function. 3. Acute kidney injury on CKD stage IV-patient baseline creatinine around 2.8, today creatinine is 3.20.    Will Follow BMP in am.  Avoid nephrotoxins.  Will avoid giving further diuretics. 4. Hyperkalemia-resolved, today potassium is 4.7.    Follow BMP in am. 5. Hypertension-blood pressure stable, continue home medications. 6. Hypothyroidism-continue Synthroid.  TSH 4.416.     COVID-19 Labs  Recent Labs    02/08/20 1145  FERRITIN 60    Lab Results  Component Value Date   SARSCOV2NAA NEGATIVE 02/05/2020   [place] NEGATIVE 03/18/2019     Scheduled medications:   . amLODipine  10 mg Oral Daily  . aspirin EC  81 mg Oral Daily  . atorvastatin  20 mg  Oral Daily  . budesonide (PULMICORT) nebulizer solution  0.25 mg Nebulization BID  . Chlorhexidine Gluconate Cloth  6 each Topical Q0600  . cholecalciferol  10,000 Units Oral Daily  . famotidine  20 mg Oral Daily  . guaiFENesin  1,200 mg Oral BID  . heparin  5,000 Units Subcutaneous Q8H  . hydrALAZINE  25 mg Oral TID  . ipratropium-albuterol  3 mL Nebulization BID  . methylPREDNISolone (SOLU-MEDROL) injection  60 mg Intravenous Q6H  . multivitamin with minerals  1 tablet Oral Daily  . mupirocin ointment  1 application Nasal BID  . nystatin   Topical TID  . rOPINIRole  0.5 mg Oral QHS  . sodium zirconium cyclosilicate  10 g Oral Daily  . tamsulosin  0.4 mg Oral QPM  . thyroid  90 mg Oral QAC breakfast  . traZODone  50 mg Oral QHS         CBG: Recent Labs  Lab 02/06/20 0822 02/06/20 2116  GLUCAP 72 117*    SpO2: 95 % O2 Flow Rate (L/min): 3 L/min    CBC: Recent Labs  Lab 02/05/20 1517 02/06/20 0525 02/07/20 0555 02/09/20 0750  WBC 9.6 8.7 8.1 14.3*  NEUTROABS 7.1  --   --   --   HGB 9.8* 8.7* 8.3* 8.5*  HCT 32.9* 28.9* 28.9* 27.5*  MCV 104.8* 104.7* 106.6* 102.2*  PLT 255 222 204 286    Basic Metabolic Panel: Recent Labs  Lab 02/05/20 1517 02/05/20 1517 02/05/20 1800 02/06/20 0525 02/07/20 0555 02/08/20 0637 02/09/20 0750  NA 142  --   --  142  143 139 135  K 6.6*   < > 5.8* 6.2* 5.3* 5.7* 4.7  CL 114*  --   --  118* 116* 111 108  CO2 20*  --   --  20* 20* 20* 19*  GLUCOSE 166*  --   --  97 114* 272* 266*  BUN 36*  --   --  31* 30* 40* 54*  CREATININE 3.18*  --   --  2.85* 2.91* 3.35* 3.20*  CALCIUM 8.6*  --   --  8.2* 8.5* 8.4* 8.4*  PHOS  --   --   --   --   --   --  5.1*   < > = values in this interval not displayed.     Liver Function Tests: Recent Labs  Lab 02/06/20 0525 02/09/20 0750  AST 11*  --   ALT 11  --   ALKPHOS 51  --   BILITOT 0.1*  --   PROT 5.3*  --   ALBUMIN 2.7* 2.7*     Antibiotics: Anti-infectives (From  admission, onward)   Start     Dose/Rate Route Frequency Ordered Stop   02/06/20 1730  cefTRIAXone (ROCEPHIN) 2 g in sodium chloride 0.9 % 100 mL IVPB        2 g 200 mL/hr over 30 Minutes Intravenous Every 24 hours 02/06/20 0015 02/11/20 2159   02/06/20 1730  azithromycin (ZITHROMAX) 500 mg in sodium chloride 0.9 % 250 mL IVPB        500 mg 250 mL/hr over 60 Minutes Intravenous Every 24 hours 02/06/20 0015 02/11/20 1959   02/05/20 1545  cefTRIAXone (ROCEPHIN) 1 g in sodium chloride 0.9 % 100 mL IVPB        1 g 200 mL/hr over 30 Minutes Intravenous  Once 02/05/20 1544 02/05/20 1800   02/05/20 1545  azithromycin (ZITHROMAX) 500 mg in sodium chloride 0.9 % 250 mL IVPB        500 mg 250 mL/hr over 60 Minutes Intravenous  Once 02/05/20 1544 02/05/20 1926       DVT prophylaxis: Heparin   Code Status: Full code  Family Communication: Discussed with daughter and son-in-law on phone   Consultants: Nephrology  Procedures:      Objective   Vitals:   02/08/20 2050 02/08/20 2148 02/09/20 0352 02/09/20 0804  BP:  (!) 149/85 (!) 162/50   Pulse:  76 71   Resp:  18 20   Temp:  97.8 F (36.6 C) 97.8 F (36.6 C)   TempSrc:      SpO2: 96% 98% 99% 95%  Weight:      Height:        Intake/Output Summary (Last 24 hours) at 02/09/2020 1430 Last data filed at 02/09/2020 0500 Gross per 24 hour  Intake 240 ml  Output 650 ml  Net -410 ml    11/25 1901 - 11/27 0700 In: 720 [P.O.:720] Out: 750 [Urine:750]  Filed Weights   02/05/20 1432 02/05/20 2300  Weight: 63.5 kg 75.8 kg    Physical Examination:  General-appears in no acute distress Heart-S1-S2, regular, no murmur auscultated Lungs-decreased breath sounds at lung bases Abdomen-soft, nontender, no organomegaly Extremities-no edema in the lower extremities Neuro-alert, oriented x3, no focal deficit noted  Status is: Inpatient  Dispo: The patient is from: Home              Anticipated d/c is to: Assisted living  facility  Anticipated d/c date is: ALF              Patient currently not medically stable for discharge  Barrier to discharge-ongoing shortness of breath      Data Reviewed:   Recent Results (from the past 240 hour(s))  Resp Panel by RT-PCR (Flu A&B, Covid) Nasopharyngeal Swab     Status: None   Collection Time: 02/05/20  3:45 PM   Specimen: Nasopharyngeal Swab; Nasopharyngeal(NP) swabs in vial transport medium  Result Value Ref Range Status   SARS Coronavirus 2 by RT PCR NEGATIVE NEGATIVE Final    Comment: (NOTE) SARS-CoV-2 target nucleic acids are NOT DETECTED.  The SARS-CoV-2 RNA is generally detectable in upper respiratory specimens during the acute phase of infection. The lowest concentration of SARS-CoV-2 viral copies this assay can detect is 138 copies/mL. A negative result does not preclude SARS-Cov-2 infection and should not be used as the sole basis for treatment or other patient management decisions. A negative result may occur with  improper specimen collection/handling, submission of specimen other than nasopharyngeal swab, presence of viral mutation(s) within the areas targeted by this assay, and inadequate number of viral copies(<138 copies/mL). A negative result must be combined with clinical observations, patient history, and epidemiological information. The expected result is Negative.  Fact Sheet for Patients:  EntrepreneurPulse.com.au  Fact Sheet for Healthcare Providers:  IncredibleEmployment.be  This test is no t yet approved or cleared by the [place] FDA and  has been authorized for detection and/or diagnosis of SARS-CoV-2 by FDA under an Emergency Use Authorization (EUA). This EUA will remain  in effect (meaning this test can be used) for the duration of the COVID-19 declaration under Section 564(b)(1) of the Act, 21 U.S.C.section 360bbb-3(b)(1), unless the authorization is terminated  or  revoked sooner.       Influenza A by PCR NEGATIVE NEGATIVE Final   Influenza B by PCR NEGATIVE NEGATIVE Final    Comment: (NOTE) The Xpert Xpress SARS-CoV-2/FLU/RSV plus assay is intended as an aid in the diagnosis of influenza from Nasopharyngeal swab specimens and should not be used as a sole basis for treatment. Nasal washings and aspirates are unacceptable for Xpert Xpress SARS-CoV-2/FLU/RSV testing.  Fact Sheet for Patients: EntrepreneurPulse.com.au  Fact Sheet for Healthcare Providers: IncredibleEmployment.be  This test is not yet approved or cleared by the [place] FDA and has been authorized for detection and/or diagnosis of SARS-CoV-2 by FDA under an Emergency Use Authorization (EUA). This EUA will remain in effect (meaning this test can be used) for the duration of the COVID-19 declaration under Section 564(b)(1) of the Act, 21 U.S.C. section 360bbb-3(b)(1), unless the authorization is terminated or revoked.  Performed at [facility], [address] [place], [place] [address]   Blood culture (routine x 2)     Status: None (Preliminary result)   Collection Time: 02/05/20  4:36 PM   Specimen: BLOOD RIGHT HAND  Result Value Ref Range Status   Specimen Description BLOOD RIGHT HAND  Final   Special Requests   Final    BOTTLES DRAWN AEROBIC AND ANAEROBIC Blood Culture adequate volume   Culture   Final    NO GROWTH 4 DAYS Performed at [facility], [address] [place], [place] [address]    Report Status PENDING  Incomplete  Blood culture (routine x 2)     Status: None (Preliminary result)   Collection Time: 02/05/20  4:37 PM   Specimen: BLOOD  Result Value Ref Range Status  Specimen Description BLOOD LEFT ANTECUBITAL  Final   Special Requests   Final    BOTTLES DRAWN AEROBIC AND ANAEROBIC Blood Culture results may not be optimal due to an inadequate volume of blood received in culture bottles   Culture   Final    NO  GROWTH 4 DAYS Performed at [facility], [address], [place], [place] [address]    Report Status PENDING  Incomplete  MRSA PCR Screening     Status: Abnormal   Collection Time: 02/06/20 10:04 AM   Specimen: Nasal Mucosa; Nasopharyngeal  Result Value Ref Range Status   MRSA by PCR POSITIVE (A) NEGATIVE Final    Comment:        The GeneXpert MRSA Assay (FDA approved for NASAL specimens only), is one component of a comprehensive MRSA colonization surveillance program. It is not intended to diagnose MRSA infection nor to guide or monitor treatment for MRSA infections. RESULT CALLED TO, READ BACK BY AND VERIFIED WITH: K GRAVES,RN@[id number]  02/06/20 MKELLY Performed at [facility], [address] [place], [place] [address]     No results for input(s): LIPASE, AMYLASE in the last 168 hours. No results for input(s): AMMONIA in the last 168 hours.  Cardiac Enzymes: No results for input(s): CKTOTAL, CKMB, CKMBINDEX, TROPONINI in the last 168 hours. BNP (last 3 results) Recent Labs    03/18/19 1530 02/05/20 1517  BNP 225.0* 227.0*    ProBNP (last 3 results) No results for input(s): PROBNP in the last 8760 hours.  Studies:  US RENAL  Result Date: 2020/02/22 CLINICAL DATA:  Acute renal injury. EXAM: RENAL / URINARY TRACT ULTRASOUND COMPLETE COMPARISON:  Ultrasound 02/15/2018. FINDINGS: Right Kidney: Renal measurements: 8.5 x 4.0 x 3.9 cm = volume: 70 mL. Renal cortical thinning. Echogenicity within normal limits. 3.1 cm simple cyst. No hydronephrosis visualized. Left Kidney: Renal measurements: 11.2 x 4.7 x 4.5 cm = volume: 124 mL. Renal cortical thinning. Echogenicity within normal limits. 2.1 cm simple cyst. Hydronephrosis visualized. Bladder: Appears normal for degree of bladder distention. Other: None. IMPRESSION: 1. No acute abnormality. No hydronephrosis or bladder distention. 2. Bilateral simple renal cysts. Bilateral renal cortical thinning. Right kidney is slightly atrophic.  Electronically Signed   By: Marcello Moores  Register   On: Feb 22, 2020 12:09       Oswald Hillock   [facility] If 7PM-7AM, please contact night-coverage at [email or web address], Office  [phone]   02/09/2020, 2:30 PM  LOS: 4 days

## 2020-02-09 NOTE — Plan of Care (Signed)

## 2020-02-10 DIAGNOSIS — J189 Pneumonia, unspecified organism: Secondary | ICD-10-CM | POA: Diagnosis not present

## 2020-02-10 DIAGNOSIS — E039 Hypothyroidism, unspecified: Secondary | ICD-10-CM | POA: Diagnosis not present

## 2020-02-10 DIAGNOSIS — N179 Acute kidney failure, unspecified: Secondary | ICD-10-CM | POA: Diagnosis not present

## 2020-02-10 DIAGNOSIS — E875 Hyperkalemia: Secondary | ICD-10-CM | POA: Diagnosis not present

## 2020-02-10 LAB — RENAL FUNCTION PANEL
Albumin: 2.8 g/dL — ABNORMAL LOW (ref 3.5–5.0)
Anion gap: 8 (ref 5–15)
BUN: 70 mg/dL — ABNORMAL HIGH (ref 8–23)
CO2: 20 mmol/L — ABNORMAL LOW (ref 22–32)
Calcium: 8.5 mg/dL — ABNORMAL LOW (ref 8.9–10.3)
Chloride: 106 mmol/L (ref 98–111)
Creatinine, Ser: 3.06 mg/dL — ABNORMAL HIGH (ref 0.44–1.00)
GFR, Estimated: 14 mL/min — ABNORMAL LOW (ref >60)
Glucose, Bld: 307 mg/dL — ABNORMAL HIGH (ref 70–99)
Phosphorus: 5.4 mg/dL — ABNORMAL HIGH (ref 2.5–4.6)
Potassium: 4.7 mmol/L (ref 3.5–5.1)
Sodium: 134 mmol/L — ABNORMAL LOW (ref 135–145)

## 2020-02-10 LAB — GLUCOSE, CAPILLARY
Glucose-Capillary: 374 mg/dL — ABNORMAL HIGH (ref 70–99)
Glucose-Capillary: 396 mg/dL — ABNORMAL HIGH (ref 70–99)

## 2020-02-10 LAB — CULTURE, BLOOD (ROUTINE X 2)
Culture: NO GROWTH
Culture: NO GROWTH
Special Requests: ADEQUATE

## 2020-02-10 LAB — FOLATE RBC
Folate, Hemolysate: 617 ng/mL
Folate, RBC: 2128 ng/mL (ref >498)
Hematocrit: 29 % — ABNORMAL LOW (ref 34.0–46.6)

## 2020-02-10 LAB — HEMOGLOBIN A1C
Hgb A1c MFr Bld: 7 % — ABNORMAL HIGH (ref 4.8–5.6)
Mean Plasma Glucose: 154.2 mg/dL

## 2020-02-10 MED ORDER — INSULIN ASPART 100 UNIT/ML ~~LOC~~ SOLN
0.0000 [IU] | Freq: Three times a day (TID) | SUBCUTANEOUS | Status: DC
  Administered 2020-02-10 (×2): 9 [IU] via SUBCUTANEOUS
  Administered 2020-02-11 (×2): 3 [IU] via SUBCUTANEOUS
  Administered 2020-02-11: 5 [IU] via SUBCUTANEOUS
  Administered 2020-02-12: 7 [IU] via SUBCUTANEOUS
  Administered 2020-02-12: 2 [IU] via SUBCUTANEOUS
  Administered 2020-02-12: 5 [IU] via SUBCUTANEOUS

## 2020-02-10 MED ORDER — METHYLPREDNISOLONE SODIUM SUCC 40 MG IJ SOLR
40.0000 mg | Freq: Two times a day (BID) | INTRAMUSCULAR | Status: DC
  Administered 2020-02-10 – 2020-02-11 (×2): 40 mg via INTRAVENOUS
  Filled 2020-02-10 (×2): qty 1

## 2020-02-10 NOTE — Progress Notes (Signed)
[facility] Progress Note    Assessment/ Plan:   1.  AKI/CKD stage IV- in setting of IV diuresis +/- trimethoprim which is on medlist but unclear if takes at home  Likely cardiorenal syndrome vs progressive CKD.  She is not a candidate for dialysis given her advanced age, multiple co-morbidities (including dementia), and poor functional and nutritional status.  Recommend conservative management as well as Palliative Care consult to help set goals/limits of care (she is full code). 2. Hyperkalemia- increase lokelma to 10 grams daily and follow--> improved 3. Acute hypoxic respiratory failure- likely combination of interstitial lung disease and pulmonary edema.  on antibiotics- azithro and ceftriaxone- for CAP along with solumedrol.  Got one IV dose of Lasix 11/25 and nothing since, adding daily weights/ strict I/O. Anticipate she may need more Lasix depending on I/O trend.  On 3L O2 per charting, appears to be stable requirement. 4. Anemia of CKD stage IV.  Will check iron stores and follow. 5. HTN- stable for now 6. DM type 2- per primary 7. Dementia 8. Failure to thrive 9. Severe protein malnutrition- will check SPEP/UPEP as well. 10. Disposition - poor overall prognosis and recommend palliative care consult to help set goals/limits of care.  Conservative management for now.  Subjective:    On 3L O2.  Cr up to 3.06 today, K down to 4.7.  Review of MAR indicates that she got one IV dose of Lasix 11/25 but nothing else.      Objective:   BP (!) 175/56 (BP Location: Right Arm)   Pulse 70   Temp (!) 97.5 F (36.4 C)   Resp (!) 22   Ht 5\' 3"  (1.6 m)   Wt 75.8 kg   SpO2 95%   BMI 29.60 kg/m   Intake/Output Summary (Last 24 hours) at 02/10/2020 1236 Last data filed at 02/10/2020 0500 Gross per 24 hour  Intake 480 ml  Output 1000 ml  Net -520 ml   Weight change:   Physical Exam:  Remotely rounded- not directly examined.     Imaging: No results  found.  Labs: BMET Recent Labs  Lab 02/05/20 1517 02/05/20 1800 02/06/20 0525 02/07/20 0555 02/08/20 0637 02/09/20 0750 02/10/20 0733  NA 142  --  142 143 139 135 134*  K 6.6* 5.8* 6.2* 5.3* 5.7* 4.7 4.7  CL 114*  --  118* 116* 111 108 106  CO2 20*  --  20* 20* 20* 19* 20*  GLUCOSE 166*  --  97 114* 272* 266* 307*  BUN 36*  --  31* 30* 40* 54* 70*  CREATININE 3.18*  --  2.85* 2.91* 3.35* 3.20* 3.06*  CALCIUM 8.6*  --  8.2* 8.5* 8.4* 8.4* 8.5*  PHOS  --   --   --   --   --  5.1* 5.4*   CBC Recent Labs  Lab 02/05/20 1517 02/05/20 1517 02/06/20 0525 02/07/20 0555 02/08/20 1145 02/09/20 0750  WBC 9.6  --  8.7 8.1  --  14.3*  NEUTROABS 7.1  --   --   --   --   --   HGB 9.8*  --  8.7* 8.3*  --  8.5*  HCT 32.9*   < > 28.9* 28.9* 29.0* 27.5*  MCV 104.8*  --  104.7* 106.6*  --  102.2*  PLT 255  --  222 204  --  207   < > = values in this interval not displayed.    Medications:    .  amLODipine  10 mg Oral Daily  . aspirin EC  81 mg Oral Daily  . atorvastatin  20 mg Oral Daily  . budesonide (PULMICORT) nebulizer solution  0.25 mg Nebulization BID  . Chlorhexidine Gluconate Cloth  6 each Topical Q0600  . cholecalciferol  10,000 Units Oral Daily  . famotidine  20 mg Oral Daily  . guaiFENesin  1,200 mg Oral BID  . heparin  5,000 Units Subcutaneous Q8H  . hydrALAZINE  25 mg Oral TID  . insulin aspart  0-9 Units Subcutaneous TID WC  . ipratropium-albuterol  3 mL Nebulization BID  . methylPREDNISolone (SOLU-MEDROL) injection  40 mg Intravenous Q12H  . multivitamin with minerals  1 tablet Oral Daily  . mupirocin ointment  1 application Nasal BID  . nystatin   Topical TID  . rOPINIRole  0.5 mg Oral QHS  . tamsulosin  0.4 mg Oral QPM  . thyroid  90 mg Oral QAC breakfast  . traZODone  50 mg Oral QHS      Madelon Lips MD 02/10/2020, 12:36 PM

## 2020-02-10 NOTE — Plan of Care (Signed)

## 2020-02-10 NOTE — Progress Notes (Addendum)
[facility]  PROGRESS NOTE  Regina Bush NKN:[id number] DOB: 1927/12/13 DOA: 02/05/2020 PCP: Doree Albee, MD   Brief HPI:   86 year old female with a history of thyroid disease, SDH, protein calorie malnutrition, hypertension, failure to thrive, diabetes mellitus , CAD, CVA came with dyspnea    Subjective   Patient seen and examined, breathing comfortably on 3 L/min oxygen via nasal cannula   Assessment/Plan:     1. Acute hypoxemic respiratory failure-slightly improved, CT chest high-resolution obtained showed bilateral pulmonary edema, inflammatory bronchitis.  Patient started on Zithromax and Rocephin.  BNP is mildly elevated to 227.0.  Continue  DuoNeb every 6 hours, will cut down Solu-Medrol to 40 mg IV every 12 hours due to elevated blood glucose.  Continue  Mucinex 1 tablet p.o. twice daily, Pulmicort nebulizer twice a day. 2. Grade 1 diastolic dysfunction/pulmonary edema-seen on CT chest, echocardiogram from 2019 showed grade 1 diastolic dysfunction.  Called discussed with nephrologist on-call Dr. Candiss Norse.  Patient does have history of CKD stage IV with baseline creatinine 2.91 today.  She was given 1 dose of Lasix 40 mg IV with no significant diuretic response, her breathing seems to have improved.  Will avoid giving further diuretics due to worsening renal function. 3. Acute kidney injury on CKD stage IV-patient baseline creatinine around 2.8, today creatinine is 306.  Avoid nephrotoxins.  Will avoid giving further diuretics. 4. Hyperkalemia-resolved, today potassium is 4.7. Follow BMP in am. 5. Hypertension-blood pressure stable, continue home medications. 6. Hypothyroidism-continue Synthroid.  TSH 4.416. 7. Diabetes mellitus type 2-blood glucose has been elevated due to steroids, will start sensitive sliding scale insulin with NovoLog.  Check CBG before every meal and at bedtime.  Dose of Solu-Medrol has been cut down to 40 mg IV every 12 hours.     COVID-19  Labs  Recent Labs    02/08/20 1145  FERRITIN 60    Lab Results  Component Value Date   SARSCOV2NAA NEGATIVE 02/05/2020   [place] NEGATIVE 03/18/2019     Scheduled medications:    amLODipine  10 mg Oral Daily   aspirin EC  81 mg Oral Daily   atorvastatin  20 mg Oral Daily   budesonide (PULMICORT) nebulizer solution  0.25 mg Nebulization BID   Chlorhexidine Gluconate Cloth  6 each Topical Q0600   cholecalciferol  10,000 Units Oral Daily   famotidine  20 mg Oral Daily   guaiFENesin  1,200 mg Oral BID   heparin  5,000 Units Subcutaneous Q8H   hydrALAZINE  25 mg Oral TID   ipratropium-albuterol  3 mL Nebulization BID   methylPREDNISolone (SOLU-MEDROL) injection  40 mg Intravenous Q12H   multivitamin with minerals  1 tablet Oral Daily   mupirocin ointment  1 application Nasal BID   nystatin   Topical TID   rOPINIRole  0.5 mg Oral QHS   tamsulosin  0.4 mg Oral QPM   thyroid  90 mg Oral QAC breakfast   traZODone  50 mg Oral QHS         CBG: Recent Labs  Lab 02/06/20 0822 02/06/20 2116  GLUCAP 72 117*    SpO2: 95 % O2 Flow Rate (L/min): 3 L/min    CBC: Recent Labs  Lab 02/05/20 1517 02/06/20 0525 02/07/20 0555 02/09/20 0750  WBC 9.6 8.7 8.1 14.3*  NEUTROABS 7.1  --   --   --   HGB 9.8* 8.7* 8.3* 8.5*  HCT 32.9* 28.9* 28.9* 27.5*  MCV 104.8* 104.7* 106.6* 102.2*  PLT 255  222 204 299    Basic Metabolic Panel: Recent Labs  Lab 02/06/20 0525 02/07/20 0555 02/08/20 0637 02/09/20 0750 02/10/20 0733  NA 142 143 139 135 134*  K 6.2* 5.3* 5.7* 4.7 4.7  CL 118* 116* 111 108 106  CO2 20* 20* 20* 19* 20*  GLUCOSE 97 114* 272* 266* 307*  BUN 31* 30* 40* 54* 70*  CREATININE 2.85* 2.91* 3.35* 3.20* 3.06*  CALCIUM 8.2* 8.5* 8.4* 8.4* 8.5*  PHOS  --   --   --  5.1* 5.4*     Liver Function Tests: Recent Labs  Lab 02/06/20 0525 02/09/20 0750 02/10/20 0733  AST 11*  --   --   ALT 11  --   --   ALKPHOS 51  --   --   BILITOT  0.1*  --   --   PROT 5.3*  --   --   ALBUMIN 2.7* 2.7* 2.8*     Antibiotics: Anti-infectives (From admission, onward)   Start     Dose/Rate Route Frequency Ordered Stop   02/06/20 1730  cefTRIAXone (ROCEPHIN) 2 g in sodium chloride 0.9 % 100 mL IVPB        2 g 200 mL/hr over 30 Minutes Intravenous Every 24 hours 02/06/20 0015 02/11/20 2159   02/06/20 1730  azithromycin (ZITHROMAX) 500 mg in sodium chloride 0.9 % 250 mL IVPB        500 mg 250 mL/hr over 60 Minutes Intravenous Every 24 hours 02/06/20 0015 02/11/20 1959   02/05/20 1545  cefTRIAXone (ROCEPHIN) 1 g in sodium chloride 0.9 % 100 mL IVPB        1 g 200 mL/hr over 30 Minutes Intravenous  Once 02/05/20 1544 02/05/20 1800   02/05/20 1545  azithromycin (ZITHROMAX) 500 mg in sodium chloride 0.9 % 250 mL IVPB        500 mg 250 mL/hr over 60 Minutes Intravenous  Once 02/05/20 1544 02/05/20 1926       DVT prophylaxis: Heparin   Code Status: Full code  Family Communication: Discussed with daughter and son-in-law on phone   Consultants: Nephrology  Procedures:      Objective   Vitals:   02/09/20 1950 02/09/20 1958 02/09/20 2054 02/10/20 0444  BP:   (!) 147/51 (!) 175/56  Pulse:   84 70  Resp:   20 (!) 22  Temp:   97.9 F (36.6 C) (!) 97.5 F (36.4 C)  TempSrc:      SpO2: 95% 95% 96% 95%  Weight:      Height:        Intake/Output Summary (Last 24 hours) at 02/10/2020 1143 Last data filed at 02/10/2020 0500 Gross per 24 hour  Intake 480 ml  Output 1000 ml  Net -520 ml    11/26 1901 - 11/28 0700 In: 720 [P.O.:720] Out: 1200 [Urine:1200]  Filed Weights   02/05/20 1432 02/05/20 2300  Weight: 63.5 kg 75.8 kg    Physical Examination:  General-appears in no acute distress Heart-S1-S2, regular, no murmur auscultated Lungs-scattered wheezing bilaterally Abdomen-soft, nontender, no organomegaly Extremities-no edema in the lower extremities Neuro-alert, oriented x3, no focal deficit noted  Status  is: Inpatient  Dispo: The patient is from: Home              Anticipated d/c is to: Assisted living facility              Anticipated d/c date is: 02/11/2020  Patient currently not medically stable for discharge  Barrier to discharge-ongoing shortness of breath      Data Reviewed:   Recent Results (from the past 240 hour(s))  Resp Panel by RT-PCR (Flu A&B, Covid) Nasopharyngeal Swab     Status: None   Collection Time: 02/05/20  3:45 PM   Specimen: Nasopharyngeal Swab; Nasopharyngeal(NP) swabs in vial transport medium  Result Value Ref Range Status   SARS Coronavirus 2 by RT PCR NEGATIVE NEGATIVE Final    Comment: (NOTE) SARS-CoV-2 target nucleic acids are NOT DETECTED.  The SARS-CoV-2 RNA is generally detectable in upper respiratory specimens during the acute phase of infection. The lowest concentration of SARS-CoV-2 viral copies this assay can detect is 138 copies/mL. A negative result does not preclude SARS-Cov-2 infection and should not be used as the sole basis for treatment or other patient management decisions. A negative result may occur with  improper specimen collection/handling, submission of specimen other than nasopharyngeal swab, presence of viral mutation(s) within the areas targeted by this assay, and inadequate number of viral copies(<138 copies/mL). A negative result must be combined with clinical observations, patient history, and epidemiological information. The expected result is Negative.  Fact Sheet for Patients:  EntrepreneurPulse.com.au  Fact Sheet for Healthcare Providers:  IncredibleEmployment.be  This test is no t yet approved or cleared by the [place] FDA and  has been authorized for detection and/or diagnosis of SARS-CoV-2 by FDA under an Emergency Use Authorization (EUA). This EUA will remain  in effect (meaning this test can be used) for the duration of the COVID-19 declaration under  Section 564(b)(1) of the Act, 21 U.S.C.section 360bbb-3(b)(1), unless the authorization is terminated  or revoked sooner.       Influenza A by PCR NEGATIVE NEGATIVE Final   Influenza B by PCR NEGATIVE NEGATIVE Final    Comment: (NOTE) The Xpert Xpress SARS-CoV-2/FLU/RSV plus assay is intended as an aid in the diagnosis of influenza from Nasopharyngeal swab specimens and should not be used as a sole basis for treatment. Nasal washings and aspirates are unacceptable for Xpert Xpress SARS-CoV-2/FLU/RSV testing.  Fact Sheet for Patients: EntrepreneurPulse.com.au  Fact Sheet for Healthcare Providers: IncredibleEmployment.be  This test is not yet approved or cleared by the [place] FDA and has been authorized for detection and/or diagnosis of SARS-CoV-2 by FDA under an Emergency Use Authorization (EUA). This EUA will remain in effect (meaning this test can be used) for the duration of the COVID-19 declaration under Section 564(b)(1) of the Act, 21 U.S.C. section 360bbb-3(b)(1), unless the authorization is terminated or revoked.  Performed at [facility], [address], [place], [place] [address]   Blood culture (routine x 2)     Status: None   Collection Time: 02/05/20  4:36 PM   Specimen: BLOOD RIGHT HAND  Result Value Ref Range Status   Specimen Description BLOOD RIGHT HAND  Final   Special Requests   Final    BOTTLES DRAWN AEROBIC AND ANAEROBIC Blood Culture adequate volume   Culture   Final    NO GROWTH 5 DAYS Performed at [facility], [address], [place], [place] [address]    Report Status 02/10/2020 FINAL  Final  Blood culture (routine x 2)     Status: None   Collection Time: 02/05/20  4:37 PM   Specimen: BLOOD  Result Value Ref Range Status   Specimen Description BLOOD LEFT ANTECUBITAL  Final   Special Requests   Final    BOTTLES DRAWN AEROBIC AND  ANAEROBIC Blood Culture results may not be optimal due to an inadequate  volume of blood received in culture bottles   Culture   Final    NO GROWTH 5 DAYS Performed at [facility], [address], [place], [place] [address]    Report Status 02/10/2020 FINAL  Final  MRSA PCR Screening     Status: Abnormal   Collection Time: 02/06/20 10:04 AM   Specimen: Nasal Mucosa; Nasopharyngeal  Result Value Ref Range Status   MRSA by PCR POSITIVE (A) NEGATIVE Final    Comment:        The GeneXpert MRSA Assay (FDA approved for NASAL specimens only), is one component of a comprehensive MRSA colonization surveillance program. It is not intended to diagnose MRSA infection nor to guide or monitor treatment for MRSA infections. RESULT CALLED TO, READ BACK BY AND VERIFIED WITH: K GRAVES,[email or web address]  02/06/20 MKELLY Performed at [facility], [address] [place], [place] [address]     No results for input(s): LIPASE, AMYLASE in the last 168 hours. No results for input(s): AMMONIA in the last 168 hours.  Cardiac Enzymes: No results for input(s): CKTOTAL, CKMB, CKMBINDEX, TROPONINI in the last 168 hours. BNP (last 3 results) Recent Labs    03/18/19 1530 02/05/20 1517  BNP 225.0* 227.0*    ProBNP (last 3 results) No results for input(s): PROBNP in the last 8760 hours.  Studies:  US RENAL  Result Date: 16-Feb-2020 CLINICAL DATA:  Acute renal injury. EXAM: RENAL / URINARY TRACT ULTRASOUND COMPLETE COMPARISON:  Ultrasound 02/15/2018. FINDINGS: Right Kidney: Renal measurements: 8.5 x 4.0 x 3.9 cm = volume: 70 mL. Renal cortical thinning. Echogenicity within normal limits. 3.1 cm simple cyst. No hydronephrosis visualized. Left Kidney: Renal measurements: 11.2 x 4.7 x 4.5 cm = volume: 124 mL. Renal cortical thinning. Echogenicity within normal limits. 2.1 cm simple cyst. Hydronephrosis visualized. Bladder: Appears normal for degree of bladder distention. Other: None. IMPRESSION: 1. No acute abnormality. No hydronephrosis or bladder distention. 2. Bilateral simple renal  cysts. Bilateral renal cortical thinning. Right kidney is slightly atrophic. Electronically Signed   By: Marcello Moores  Register   On: 02-16-2020 12:09       Oswald Hillock   [facility] If 7PM-7AM, please contact night-coverage at [email or web address], Office  [phone]   02/10/2020, 11:43 AM  LOS: 5 days

## 2020-02-11 DIAGNOSIS — J441 Chronic obstructive pulmonary disease with (acute) exacerbation: Secondary | ICD-10-CM

## 2020-02-11 DIAGNOSIS — J189 Pneumonia, unspecified organism: Secondary | ICD-10-CM | POA: Diagnosis not present

## 2020-02-11 DIAGNOSIS — N179 Acute kidney failure, unspecified: Secondary | ICD-10-CM | POA: Diagnosis not present

## 2020-02-11 LAB — BASIC METABOLIC PANEL
Anion gap: 8 (ref 5–15)
BUN: 78 mg/dL — ABNORMAL HIGH (ref 8–23)
CO2: 21 mmol/L — ABNORMAL LOW (ref 22–32)
Calcium: 8.9 mg/dL (ref 8.9–10.3)
Chloride: 108 mmol/L (ref 98–111)
Creatinine, Ser: 2.72 mg/dL — ABNORMAL HIGH (ref 0.44–1.00)
GFR, Estimated: 16 mL/min — ABNORMAL LOW (ref >60)
Glucose, Bld: 267 mg/dL — ABNORMAL HIGH (ref 70–99)
Potassium: 4.9 mmol/L (ref 3.5–5.1)
Sodium: 137 mmol/L (ref 135–145)

## 2020-02-11 LAB — RENAL FUNCTION PANEL
Albumin: 2.9 g/dL — ABNORMAL LOW (ref 3.5–5.0)
Anion gap: 8 (ref 5–15)
BUN: 77 mg/dL — ABNORMAL HIGH (ref 8–23)
CO2: 20 mmol/L — ABNORMAL LOW (ref 22–32)
Calcium: 8.8 mg/dL — ABNORMAL LOW (ref 8.9–10.3)
Chloride: 107 mmol/L (ref 98–111)
Creatinine, Ser: 2.77 mg/dL — ABNORMAL HIGH (ref 0.44–1.00)
GFR, Estimated: 16 mL/min — ABNORMAL LOW (ref >60)
Glucose, Bld: 269 mg/dL — ABNORMAL HIGH (ref 70–99)
Phosphorus: 5.1 mg/dL — ABNORMAL HIGH (ref 2.5–4.6)
Potassium: 4.8 mmol/L (ref 3.5–5.1)
Sodium: 135 mmol/L (ref 135–145)

## 2020-02-11 LAB — URINALYSIS, COMPLETE (UACMP) WITH MICROSCOPIC
Bilirubin Urine: NEGATIVE
Glucose, UA: >=500 mg/dL — AB
Hgb urine dipstick: NEGATIVE
Ketones, ur: NEGATIVE mg/dL
Leukocytes,Ua: NEGATIVE
Nitrite: NEGATIVE
Protein, ur: >=300 mg/dL — AB
Specific Gravity, Urine: 1.014 (ref 1.005–1.030)
pH: 5 (ref 5.0–8.0)

## 2020-02-11 LAB — GLUCOSE, CAPILLARY
Glucose-Capillary: 313 mg/dL — ABNORMAL HIGH (ref 70–99)
Glucose-Capillary: 225 mg/dL — ABNORMAL HIGH (ref 70–99)
Glucose-Capillary: 222 mg/dL — ABNORMAL HIGH (ref 70–99)
Glucose-Capillary: 271 mg/dL — ABNORMAL HIGH (ref 70–99)
Glucose-Capillary: 241 mg/dL — ABNORMAL HIGH (ref 70–99)
Glucose-Capillary: 262 mg/dL — ABNORMAL HIGH (ref 70–99)

## 2020-02-11 LAB — PROTEIN ELECTROPHORESIS, SERUM
A/G Ratio: 1.1 (ref 0.7–1.7)
Albumin ELP: 2.7 g/dL — ABNORMAL LOW (ref 2.9–4.4)
Alpha-1-Globulin: 0.3 g/dL (ref 0.0–0.4)
Alpha-2-Globulin: 1 g/dL (ref 0.4–1.0)
Beta Globulin: 0.9 g/dL (ref 0.7–1.3)
Gamma Globulin: 0.4 g/dL (ref 0.4–1.8)
Globulin, Total: 2.5 g/dL (ref 2.2–3.9)
Total Protein ELP: 5.2 g/dL — ABNORMAL LOW (ref 6.0–8.5)

## 2020-02-11 LAB — SODIUM, URINE, RANDOM: Sodium, Ur: 24 mmol/L

## 2020-02-11 LAB — KAPPA/LAMBDA LIGHT CHAINS
Kappa free light chain: 46.1 mg/L — ABNORMAL HIGH (ref 3.3–19.4)
Kappa, lambda light chain ratio: 1.71 — ABNORMAL HIGH (ref 0.26–1.65)
Lambda free light chains: 26.9 mg/L — ABNORMAL HIGH (ref 5.7–26.3)

## 2020-02-11 LAB — STREP PNEUMONIAE URINARY ANTIGEN: Strep Pneumo Urinary Antigen: NEGATIVE

## 2020-02-11 LAB — CREATININE, URINE, RANDOM: Creatinine, Urine: 83.02 mg/dL

## 2020-02-11 MED ORDER — PREDNISONE 20 MG PO TABS
40.0000 mg | ORAL_TABLET | Freq: Every day | ORAL | Status: DC
  Administered 2020-02-12: 40 mg via ORAL
  Filled 2020-02-11: qty 2

## 2020-02-11 NOTE — Progress Notes (Signed)
[facility] Progress Note    Assessment/ Plan:   1.  Improving AKI/CKD stage IV- in setting of IV diuresis +/- trimethoprim which is on medlist but unclear if takes at home   1. Improving towards baseline SCr 2. Not a candidate for HD given #7 and debility 3. Holding diuretics, no sig vol on exam; cont for another 24h 2. Hyperkalemia- Stable < 5, off binder, CTM 3. Acute hypoxic respiratory failure- likely combination of interstitial lung disease and pulmonary edema. ABX per primary, stable to improved.   4. Anemia of CKD stage IV.  TSAT 20% and Ferritin 60; Hb stable 8s, CTM for now 5. HTN- stable for now 6. DM type 2- per primary 7. Dementia 8. Failure to thrive 9. Severe protein malnutrition- will check SPEP/UPEP as well. 10. Disposition - poor overall prognosis and recommend palliative care consult to help set goals/limits of care.  Conservative management for now.  Subjective:     Confused but pleasant this AM  States breathign 'better'  SCr down to 2.8, > 0.9L UOP  K 4.8, HCO320    Objective:   BP (!) 146/50 (BP Location: Left Arm)   Pulse 68   Temp 98.3 F (36.8 C)   Resp 18   Ht 5\' 3"  (1.6 m)   Wt 75.8 kg   SpO2 95%   BMI 29.60 kg/m   Intake/Output Summary (Last 24 hours) at 02/11/2020 0911 Last data filed at 02/11/2020 1324 Gross per 24 hour  Intake 360 ml  Output 900 ml  Net -540 ml   Weight change:   Physical Exam:  NAD CTAB ant No sig LEE Regular, nl s1s2 Confused, pleasant EOMI NCAT   Imaging: No results found.  Labs: BMET Recent Labs  Lab 02/05/20 1517 02/05/20 1517 02/05/20 1800 02/06/20 0525 02/07/20 0555 02/08/20 0637 02/09/20 0750 02/10/20 0733 02/11/20 0734  NA 142  --   --  142 143 139 135 134* 135  137  K 6.6*   < > 5.8* 6.2* 5.3* 5.7* 4.7 4.7 4.8  4.9  CL 114*  --   --  118* 116* 111 108 106 107  108  CO2 20*  --   --  20* 20* 20* 19* 20* 20*  21*  GLUCOSE 166*  --   --  97 114* 272* 266* 307*  269*  267*  BUN 36*  --   --  31* 30* 40* 54* 70* 77*  78*  CREATININE 3.18*  --   --  2.85* 2.91* 3.35* 3.20* 3.06* 2.77*  2.72*  CALCIUM 8.6*  --   --  8.2* 8.5* 8.4* 8.4* 8.5* 8.8*  8.9  PHOS  --   --   --   --   --   --  5.1* 5.4* 5.1*   < > = values in this interval not displayed.   CBC Recent Labs  Lab 02/05/20 1517 02/05/20 1517 02/06/20 0525 02/07/20 0555 02/08/20 1145 02/09/20 0750  WBC 9.6  --  8.7 8.1  --  14.3*  NEUTROABS 7.1  --   --   --   --   --   HGB 9.8*  --  8.7* 8.3*  --  8.5*  HCT 32.9*   < > 28.9* 28.9* 29.0* 27.5*  MCV 104.8*  --  104.7* 106.6*  --  102.2*  PLT 255  --  222 204  --  207   < > = values in this interval not displayed.  Medications:    . amLODipine  10 mg Oral Daily  . aspirin EC  81 mg Oral Daily  . atorvastatin  20 mg Oral Daily  . budesonide (PULMICORT) nebulizer solution  0.25 mg Nebulization BID  . Chlorhexidine Gluconate Cloth  6 each Topical Q0600  . cholecalciferol  10,000 Units Oral Daily  . famotidine  20 mg Oral Daily  . guaiFENesin  1,200 mg Oral BID  . heparin  5,000 Units Subcutaneous Q8H  . hydrALAZINE  25 mg Oral TID  . insulin aspart  0-9 Units Subcutaneous TID WC  . ipratropium-albuterol  3 mL Nebulization BID  . methylPREDNISolone (SOLU-MEDROL) injection  40 mg Intravenous Q12H  . multivitamin with minerals  1 tablet Oral Daily  . mupirocin ointment  1 application Nasal BID  . nystatin   Topical TID  . rOPINIRole  0.5 mg Oral QHS  . tamsulosin  0.4 mg Oral QPM  . thyroid  90 mg Oral QAC breakfast  . traZODone  50 mg Oral QHS      Rexene Agent  MD 02/11/2020, 9:11 AM

## 2020-02-11 NOTE — H&P (Addendum)
[facility]  PROGRESS NOTE  Regina Bush NID:[id number] DOB: 1927-09-20 DOA: 02/05/2020 PCP: Doree Albee, MD   Brief HPI:   86 year old female with a history of thyroid disease, SDH, protein calorie malnutrition, hypertension, failure to thrive, diabetes mellitus , CAD, CVA came with dyspnea    Subjective   Patient seen and examined, breathing is improved.   Assessment/Plan:     1. Acute hypoxemic respiratory failure-slightly improved, CT chest high-resolution obtained showed bilateral pulmonary edema, inflammatory bronchitis.  Patient started on Zithromax and Rocephin.  BNP is mildly elevated to 227.0.  Continue  DuoNeb every 6 hours, will cut down Solu-Medrol to 40 mg IV every 12 hours due to elevated blood glucose.  Continue  Mucinex 1 tablet p.o. twice daily, Pulmicort nebulizer twice a day. 2. Grade 1 diastolic dysfunction/pulmonary edema-seen on CT chest, echocardiogram from 2019 showed grade 1 diastolic dysfunction.  Called discussed with nephrologist on-call Dr. Candiss Norse.  Patient does have history of CKD stage IV with baseline creatinine 2.91 today.  She was given 1 dose of Lasix 40 mg IV with no significant diuretic response, her breathing seems to have improved.  Will avoid giving further diuretics due to worsening renal function. 3. Acute kidney injury on CKD stage IV-patient baseline creatinine around 2.8, today creatinine is 2.72.  Avoid nephrotoxins.  Will avoid giving further diuretics. 4. Hyperkalemia-resolved, today potassium is 4.7. Follow BMP in am. 5. Hypertension-blood pressure stable, continue home medications. 6. Hypothyroidism-continue Synthroid.  TSH 4.416. 7. Diabetes mellitus type 2-blood glucose has been elevated due to steroids, will start sensitive sliding scale insulin with NovoLog.  Check CBG before every meal and at bedtime.  Will discontinue Solu-Medrol and start prednisone 40 mg daily from tomorrow morning.     COVID-19 Labs  No results for  input(s): DDIMER, FERRITIN, LDH, CRP in the last 72 hours.  Lab Results  Component Value Date   SARSCOV2NAA NEGATIVE 02/05/2020   [place] NEGATIVE 03/18/2019     Scheduled medications:   . amLODipine  10 mg Oral Daily  . aspirin EC  81 mg Oral Daily  . atorvastatin  20 mg Oral Daily  . budesonide (PULMICORT) nebulizer solution  0.25 mg Nebulization BID  . Chlorhexidine Gluconate Cloth  6 each Topical Q0600  . cholecalciferol  10,000 Units Oral Daily  . famotidine  20 mg Oral Daily  . guaiFENesin  1,200 mg Oral BID  . heparin  5,000 Units Subcutaneous Q8H  . hydrALAZINE  25 mg Oral TID  . insulin aspart  0-9 Units Subcutaneous TID WC  . ipratropium-albuterol  3 mL Nebulization BID  . multivitamin with minerals  1 tablet Oral Daily  . mupirocin ointment  1 application Nasal BID  . nystatin   Topical TID  . [START ON 02/12/2020] predniSONE  40 mg Oral Q breakfast  . rOPINIRole  0.5 mg Oral QHS  . tamsulosin  0.4 mg Oral QPM  . thyroid  90 mg Oral QAC breakfast  . traZODone  50 mg Oral QHS         CBG: Recent Labs  Lab 02/10/20 2216 02/11/20 0727 02/11/20 0740 02/11/20 1134 02/11/20 1628  GLUCAP 313* 225* 222* 271* 241*    SpO2: 95 % O2 Flow Rate (L/min): 3 L/min    CBC: Recent Labs  Lab 02/05/20 1517 02/06/20 0525 02/07/20 0555 02/08/20 1145 02/09/20 0750  WBC 9.6 8.7 8.1  --  14.3*  NEUTROABS 7.1  --   --   --   --  HGB 9.8* 8.7* 8.3*  --  8.5*  HCT 32.9* 28.9* 28.9* 29.0* 27.5*  MCV 104.8* 104.7* 106.6*  --  102.2*  PLT 255 222 204  --  431    Basic Metabolic Panel: Recent Labs  Lab 02/07/20 0555 02/08/20 0637 02/09/20 0750 02/10/20 0733 02/11/20 0734  NA 143 139 135 134* 135  137  K 5.3* 5.7* 4.7 4.7 4.8  4.9  CL 116* 111 108 106 107  108  CO2 20* 20* 19* 20* 20*  21*  GLUCOSE 114* 272* 266* 307* 269*  267*  BUN 30* 40* 54* 70* 77*  78*  CREATININE 2.91* 3.35* 3.20* 3.06* 2.77*  2.72*  CALCIUM 8.5* 8.4* 8.4* 8.5* 8.8*  8.9   PHOS  --   --  5.1* 5.4* 5.1*     Liver Function Tests: Recent Labs  Lab 02/06/20 0525 02/09/20 0750 02/10/20 0733 02/11/20 0734  AST 11*  --   --   --   ALT 11  --   --   --   ALKPHOS 51  --   --   --   BILITOT 0.1*  --   --   --   PROT 5.3*  --   --   --   ALBUMIN 2.7* 2.7* 2.8* 2.9*     Antibiotics: Anti-infectives (From admission, onward)   Start     Dose/Rate Route Frequency Ordered Stop   02/06/20 1730  cefTRIAXone (ROCEPHIN) 2 g in sodium chloride 0.9 % 100 mL IVPB        2 g 200 mL/hr over 30 Minutes Intravenous Every 24 hours 02/06/20 0015 02/11/20 0030   02/06/20 1730  azithromycin (ZITHROMAX) 500 mg in sodium chloride 0.9 % 250 mL IVPB        500 mg 250 mL/hr over 60 Minutes Intravenous Every 24 hours 02/06/20 0015 02/10/20 2148   02/05/20 1545  cefTRIAXone (ROCEPHIN) 1 g in sodium chloride 0.9 % 100 mL IVPB        1 g 200 mL/hr over 30 Minutes Intravenous  Once 02/05/20 1544 02/05/20 1800   02/05/20 1545  azithromycin (ZITHROMAX) 500 mg in sodium chloride 0.9 % 250 mL IVPB        500 mg 250 mL/hr over 60 Minutes Intravenous  Once 02/05/20 1544 02/05/20 1926       DVT prophylaxis: Heparin   Code Status: Full code  Family Communication: Discussed with daughter at bedside   Consultants: Nephrology  Procedures:      Objective   Vitals:   02/10/20 2213 02/11/20 0317 02/11/20 0815 02/11/20 1500  BP: (!) 156/69 (!) 146/50  (!) 160/53  Pulse: 73 68  74  Resp: 18 18  20   Temp: 97.9 F (36.6 C) 98.3 F (36.8 C)  98.6 F (37 C)  TempSrc: Oral   Oral  SpO2: 98% 92% 95%   Weight:      Height:        Intake/Output Summary (Last 24 hours) at 02/11/2020 1759 Last data filed at 02/11/2020 5400 Gross per 24 hour  Intake 360 ml  Output 700 ml  Net -340 ml    11/27 1901 - 11/29 0700 In: 360 [P.O.:360] Out: 1400 [Urine:1400]  Filed Weights   02/05/20 1432 02/05/20 2300  Weight: 63.5 kg 75.8 kg    Physical  Examination:  General-appears in no acute distress Heart-S1-S2, regular, no murmur auscultated Lungs-clear to auscultation bilaterally Abdomen-soft, nontender, no organomegaly Extremities-no edema in the lower extremities Neuro-alert, oriented  x3, no focal deficit noted  Status is: Inpatient  Dispo: The patient is from: Home              Anticipated d/c is to: Assisted living facility              Anticipated d/c date is: 02/12/2020              Patient currently not medically stable for discharge  Barrier to discharge-ongoing shortness of breath      Data Reviewed:   Recent Results (from the past 240 hour(s))  Resp Panel by RT-PCR (Flu A&B, Covid) Nasopharyngeal Swab     Status: None   Collection Time: 02/05/20  3:45 PM   Specimen: Nasopharyngeal Swab; Nasopharyngeal(NP) swabs in vial transport medium  Result Value Ref Range Status   SARS Coronavirus 2 by RT PCR NEGATIVE NEGATIVE Final    Comment: (NOTE) SARS-CoV-2 target nucleic acids are NOT DETECTED.  The SARS-CoV-2 RNA is generally detectable in upper respiratory specimens during the acute phase of infection. The lowest concentration of SARS-CoV-2 viral copies this assay can detect is 138 copies/mL. A negative result does not preclude SARS-Cov-2 infection and should not be used as the sole basis for treatment or other patient management decisions. A negative result may occur with  improper specimen collection/handling, submission of specimen other than nasopharyngeal swab, presence of viral mutation(s) within the areas targeted by this assay, and inadequate number of viral copies(<138 copies/mL). A negative result must be combined with clinical observations, patient history, and epidemiological information. The expected result is Negative.  Fact Sheet for Patients:  EntrepreneurPulse.com.au  Fact Sheet for Healthcare Providers:  IncredibleEmployment.be  This test is no t yet  approved or cleared by the [place] FDA and  has been authorized for detection and/or diagnosis of SARS-CoV-2 by FDA under an Emergency Use Authorization (EUA). This EUA will remain  in effect (meaning this test can be used) for the duration of the COVID-19 declaration under Section 564(b)(1) of the Act, 21 U.S.C.section 360bbb-3(b)(1), unless the authorization is terminated  or revoked sooner.       Influenza A by PCR NEGATIVE NEGATIVE Final   Influenza B by PCR NEGATIVE NEGATIVE Final    Comment: (NOTE) The Xpert Xpress SARS-CoV-2/FLU/RSV plus assay is intended as an aid in the diagnosis of influenza from Nasopharyngeal swab specimens and should not be used as a sole basis for treatment. Nasal washings and aspirates are unacceptable for Xpert Xpress SARS-CoV-2/FLU/RSV testing.  Fact Sheet for Patients: EntrepreneurPulse.com.au  Fact Sheet for Healthcare Providers: IncredibleEmployment.be  This test is not yet approved or cleared by the [place] FDA and has been authorized for detection and/or diagnosis of SARS-CoV-2 by FDA under an Emergency Use Authorization (EUA). This EUA will remain in effect (meaning this test can be used) for the duration of the COVID-19 declaration under Section 564(b)(1) of the Act, 21 U.S.C. section 360bbb-3(b)(1), unless the authorization is terminated or revoked.  Performed at [facility], [address] [place], [place] [address]   Blood culture (routine x 2)     Status: None   Collection Time: 02/05/20  4:36 PM   Specimen: BLOOD RIGHT HAND  Result Value Ref Range Status   Specimen Description BLOOD RIGHT HAND  Final   Special Requests   Final    BOTTLES DRAWN AEROBIC AND ANAEROBIC Blood Culture adequate volume   Culture   Final    NO GROWTH 5 DAYS Performed at [facility], [address], [place], [place] [address]    Report Status 02/10/2020 FINAL  Final  Blood culture (routine x 2)      Status: None   Collection Time: 02/05/20  4:37 PM   Specimen: BLOOD  Result Value Ref Range Status   Specimen Description BLOOD LEFT ANTECUBITAL  Final   Special Requests   Final    BOTTLES DRAWN AEROBIC AND ANAEROBIC Blood Culture results may not be optimal due to an inadequate volume of blood received in culture bottles   Culture   Final    NO GROWTH 5 DAYS Performed at [facility], [address], [place], [place] [address]    Report Status 02/10/2020 FINAL  Final  MRSA PCR Screening     Status: Abnormal   Collection Time: 02/06/20 10:04 AM   Specimen: Nasal Mucosa; Nasopharyngeal  Result Value Ref Range Status   MRSA by PCR POSITIVE (A) NEGATIVE Final    Comment:        The GeneXpert MRSA Assay (FDA approved for NASAL specimens only), is one component of a comprehensive MRSA colonization surveillance program. It is not intended to diagnose MRSA infection nor to guide or monitor treatment for MRSA infections. RESULT CALLED TO, READ BACK BY AND VERIFIED WITH: K GRAVES,RN@2148  02/06/20 MKELLY Performed at [facility], [address], [place], [place] [address]     No results for input(s): LIPASE, AMYLASE in the last 168 hours. No results for input(s): AMMONIA in the last 168 hours.  Cardiac Enzymes: No results for input(s): CKTOTAL, CKMB, CKMBINDEX, TROPONINI in the last 168 hours. BNP (last 3 results) Recent Labs    03/18/19 1530 02/05/20 1517  BNP 225.0* 227.0*    ProBNP (last 3 results) No results for input(s): PROBNP in the last 8760 hours.  Studies:  No results found.     Oswald Hillock   [facility] If 7PM-7AM, please contact night-coverage at [email or web address], Office  [phone]   02/11/2020, 5:59 PM  LOS: 6 days

## 2020-02-11 NOTE — Care Management Important Message (Signed)
Important Message  Patient Details  Name: Regina Bush MRN: [id number] Date of Birth: 1927/05/28   Medicare Important Message Given:  Yes     [name] 02/11/2020, 4:17 PM

## 2020-02-11 NOTE — Evaluation (Signed)
Physical Therapy Evaluation Patient Details Name: Regina Bush MRN: [id number] DOB: 1928/01/13 Today's Date: 02/11/2020   History of Present Illness  Regina Bush  is a 86 y.o. female, with history of thyroid disease, subdural hematoma, protein calorie malnutrition, hypertension, failure to thrive, diabetes mellitus without complication, coronary artery disease, CVA, and more presents the ED with a chief complaint of dyspnea.  Due to patient's dementia history is limited.  She reports that she has been feeling short of breath for too long.  Nursing home staff reported that it was about a month.  Patient reports that she is also been coughing, but she does not think that it has been productive of any sputum.  She has no chest pains, no palpitations.  She has had no fevers, no body aches.  She denies any dysuria.  She also denies any muscle cramping.  It is unclear how accurate her review of systems is given her dementia.    Clinical Impression  Patient apprehensive to get up and required encouragement, demonstrates slow labored movement for sitting up at bedside, had to use armrest of wheelchair to complete stand pivot transfers, declined to use RW and limited to 2-3 steps at bedside due to fatigue.  Patient requested to go back to bed after therapy and required Min/mod assist to reposition when put back to bed.  Patient on room air with SpO2 dropping from 93% to 84% - RN notified and patient put on 3 LPM O2.  Plan: patient to be discharged back to ALF today and patient discharged from physical therapy to care of nursing for out of bed as tolerated for length of stay.      Follow Up Recommendations Home health PT;Supervision for mobility/OOB;Supervision - Intermittent    Equipment Recommendations  None recommended by PT    Recommendations for Other Services       Precautions / Restrictions Precautions Precautions: Fall Restrictions Weight Bearing Restrictions: No      Mobility  Bed  Mobility Overal bed mobility: Needs Assistance Bed Mobility: Supine to Sit;Sit to Supine     Supine to sit: Min assist Sit to supine: Min guard;Min assist   General bed mobility comments: increased time, labored movement    Transfers Overall transfer level: Needs assistance Equipment used: 1 person hand held assist Transfers: Sit to/from [facility] Sit to Stand: Min assist;Mod assist Stand pivot transfers: Min assist;Mod assist       General transfer comment: slow labored movement, had to use armrest of  wheelchair  Ambulation/Gait Ambulation/Gait assistance: Mod assist Gait Distance (Feet): 3 Feet Assistive device: 1 person hand held assist Gait Pattern/deviations: Decreased step length - right;Decreased step length - left;Decreased stride length Gait velocity: slow   General Gait Details: limited to 2-3 steps during stand pivot transfer to chair leaning on armrest of wheelchair  Stairs            Wheelchair Mobility    Modified Rankin (Stroke Patients Only)       Balance Overall balance assessment: Needs assistance Sitting-balance support: Feet supported;No upper extremity supported Sitting balance-Leahy Scale: Good Sitting balance - Comments: seated at EOB   Standing balance support: During functional activity;Single extremity supported Standing balance-Leahy Scale: Poor Standing balance comment: leaning on armrest of wheelchair                             Pertinent Vitals/Pain Pain Assessment: No/denies pain    Home Living Family/patient  expects to be discharged to:: Assisted living               Home Equipment: Wheelchair - [employer] - 2 wheels      Prior Function Level of Independence: Needs assistance   Gait / Transfers Assistance Needed: mostly wheelchair bound per patient, assisted transfers  ADL's / [place] Needed: assisted by ALF staff        Hand Dominance         Extremity/Trunk Assessment   Upper Extremity Assessment Upper Extremity Assessment: Generalized weakness    Lower Extremity Assessment Lower Extremity Assessment: Generalized weakness    Cervical / Trunk Assessment Cervical / Trunk Assessment: Normal  Communication   Communication: No difficulties  Cognition Arousal/Alertness: Awake/alert Behavior During Therapy: WFL for tasks assessed/performed;Agitated Overall Cognitive Status: History of cognitive impairments - at baseline                                 General Comments: apprehensive, requires encouragement      General Comments      Exercises     Assessment/Plan    PT Assessment All further PT needs can be met in the next venue of care  PT Problem List Decreased strength;Decreased activity tolerance;Decreased balance;Decreased mobility       PT Treatment Interventions      PT Goals (Current goals can be found in the Care Plan section)  Acute Rehab PT Goals Patient Stated Goal: return to ALF PT Goal Formulation: With patient Time For Goal Achievement: 02/11/20 Potential to Achieve Goals: Good    Frequency     Barriers to discharge        Co-evaluation               AM-PAC PT "6 Clicks" Mobility  Outcome Measure Help needed turning from your back to your side while in a flat bed without using bedrails?: A Little Help needed moving from lying on your back to sitting on the side of a flat bed without using bedrails?: A Lot Help needed moving to and from a bed to a chair (including a wheelchair)?: A Lot Help needed standing up from a chair using your arms (e.g., wheelchair or bedside chair)?: A Lot Help needed to walk in hospital room?: A Lot Help needed climbing 3-5 steps with a railing? : Total 6 Click Score: 12    End of Session Equipment Utilized During Treatment: Oxygen Activity Tolerance: Patient tolerated treatment well;Patient limited by fatigue Patient left: in bed;with  call bell/phone within reach Nurse Communication: Mobility status PT Visit Diagnosis: Unsteadiness on feet (R26.81);Other abnormalities of gait and mobility (R26.89);Muscle weakness (generalized) (M62.81)    Time: 1601-0932 PT Time Calculation (min) (ACUTE ONLY): 33 min   Charges:   PT Evaluation $PT Eval Moderate Complexity: 1 Mod PT Treatments $Therapeutic Activity: 23-37 mins        3:56 PM, 02/11/20 Lonell Grandchild, MPT Physical Therapist with [facility] 336 [phone] office [phone] mobile phone

## 2020-02-11 NOTE — Plan of Care (Signed)

## 2020-02-11 NOTE — Progress Notes (Signed)
Inpatient Diabetes Program Recommendations  AACE/ADA: New Consensus Statement on Inpatient Glycemic Control  Target Ranges:  Prepandial:   less than 140 mg/dL      Peak postprandial:   less than 180 mg/dL (1-2 hours)      Critically ill patients:  140 - 180 mg/dL   Results for CHELSEY, REDONDO (MRN [id number]) as of 02/11/2020 07:15  Ref. Range 02/10/2020 12:08 02/10/2020 16:24  Glucose-Capillary Latest Ref Range: 70 - 99 mg/dL 374 (H) 396 (H)   Review of Glycemic Control  Diabetes history: DM2 Outpatient Diabetes medications: None Current orders for Inpatient glycemic control: Novolog 0-9 units TID with meals; Solumedrol 40 mg Q12H  Inpatient Diabetes Program Recommendations:    Insulin: If steroids are continued as ordered, please consider ordering Levemir 7 units Q24H, Novolog 0-5 units QHS for bedtime correction, and Novolog 3 units TID with meals for meal coverage if patient eats at least 50% of meals.  Thanks, Barnie Alderman, RN, MSN, CDE Diabetes Coordinator Inpatient Diabetes Program [phone] (Team Pager from 8am to 5pm)

## 2020-02-11 NOTE — Progress Notes (Signed)
SATURATION QUALIFICATIONS: (This note is used to comply with regulatory documentation for home oxygen)  Patient Saturations on Room Air at Rest =88%  Patient Saturations on Room Air while Ambulating = 84%  Patient Saturations on 3 Liters of oxygen while Ambulating = 93%  :

## 2020-02-12 DIAGNOSIS — E039 Hypothyroidism, unspecified: Secondary | ICD-10-CM | POA: Diagnosis not present

## 2020-02-12 DIAGNOSIS — E875 Hyperkalemia: Secondary | ICD-10-CM | POA: Diagnosis not present

## 2020-02-12 DIAGNOSIS — N179 Acute kidney failure, unspecified: Secondary | ICD-10-CM | POA: Diagnosis not present

## 2020-02-12 LAB — RENAL FUNCTION PANEL
Albumin: 2.7 g/dL — ABNORMAL LOW (ref 3.5–5.0)
Anion gap: 8 (ref 5–15)
BUN: 88 mg/dL — ABNORMAL HIGH (ref 8–23)
CO2: 21 mmol/L — ABNORMAL LOW (ref 22–32)
Calcium: 8.8 mg/dL — ABNORMAL LOW (ref 8.9–10.3)
Chloride: 109 mmol/L (ref 98–111)
Creatinine, Ser: 2.71 mg/dL — ABNORMAL HIGH (ref 0.44–1.00)
GFR, Estimated: 16 mL/min — ABNORMAL LOW (ref >60)
Glucose, Bld: 241 mg/dL — ABNORMAL HIGH (ref 70–99)
Phosphorus: 5.1 mg/dL — ABNORMAL HIGH (ref 2.5–4.6)
Potassium: 5 mmol/L (ref 3.5–5.1)
Sodium: 138 mmol/L (ref 135–145)

## 2020-02-12 LAB — LEGIONELLA PNEUMOPHILA SEROGP 1 UR AG: L. pneumophila Serogp 1 Ur Ag: NEGATIVE

## 2020-02-12 LAB — GLUCOSE, CAPILLARY
Glucose-Capillary: 192 mg/dL — ABNORMAL HIGH (ref 70–99)
Glucose-Capillary: 258 mg/dL — ABNORMAL HIGH (ref 70–99)
Glucose-Capillary: 301 mg/dL — ABNORMAL HIGH (ref 70–99)

## 2020-02-12 MED ORDER — IPRATROPIUM-ALBUTEROL 0.5-2.5 (3) MG/3ML IN SOLN
3.0000 mL | Freq: Four times a day (QID) | RESPIRATORY_TRACT | 2 refills | Status: DC | PRN
Start: 2020-02-12 — End: 2020-02-22

## 2020-02-12 MED ORDER — PREDNISONE 10 MG PO TABS
ORAL_TABLET | ORAL | 0 refills | Status: DC
Start: 2020-02-12 — End: 2020-02-22

## 2020-02-12 MED ORDER — GUAIFENESIN ER 600 MG PO TB12: 1200.0000 mg | ORAL_TABLET | Freq: Two times a day (BID) | ORAL | 0 refills | Status: AC

## 2020-02-12 MED ORDER — SODIUM ZIRCONIUM CYCLOSILICATE 5 G PO PACK
5.0000 g | PACK | Freq: Once | ORAL | Status: AC
  Administered 2020-02-12: 5 g via ORAL
  Filled 2020-02-12: qty 1

## 2020-02-12 NOTE — Progress Notes (Signed)
[facility] Progress Note    Assessment/ Plan:   1.  Improving AKI/CKD stage IV- in setting of IV diuresis +/- trimethoprim which is on medlist but unclear if takes at home   1. Improving towards baseline SCr 2. Not a candidate for HD given #7 and debility 3. Holding diuretics, no sig vol on exam; cont for another 24h 4. ONce taking in more PO either here or as outpt would want to restart some oral diuretics 2. Hyperkalemia- Stable < 5, off binder, CTM 3. Acute hypoxic respiratory failure- likely combination of interstitial lung disease and pulmonary edema. ABX per primary, stable to improved.   4. Anemia of CKD stage IV.  TSAT 20% and Ferritin 60; Hb stable 8s, CTM for now 5. HTN- stable for now 6. DM type 2- per primary 7. Dementia 8. Failure to thrive 9. protein malnutrition- neg SPEP and SFLC 10. Disposition - poor overall prognosis and recommend palliative care consult to help set goals/limits of care.  Conservative management for now.  Will sign off for now.  Please call with any questions or concerns.  Pt need follow up with nephrology and sees [facility] 02/27/20   Subjective:    No new events  Stable labs, BUN up on steroids  Remains on 3L    Objective:   BP 130/61 (BP Location: Right Leg)   Pulse 64   Temp 98.2 F (36.8 C) (Oral)   Resp 16   Ht 5\' 3"  (1.6 m)   Wt 79.9 kg   SpO2 98%   BMI 31.20 kg/m  No intake or output data in the 24 hours ending 02/12/20 0952 Weight change:   Physical Exam:  NAD CTAB ant No sig LEE Regular, nl s1s2 Confused, pleasant EOMI NCAT   Imaging: No results found.  Labs: BMET Recent Labs  Lab 02/06/20 0525 02/07/20 0555 02/08/20 0637 02/09/20 0750 02/10/20 0733 02/11/20 0734 02/12/20 0515  NA 142 143 139 135 134* 135  137 138  K 6.2* 5.3* 5.7* 4.7 4.7 4.8  4.9 5.0  CL 118* 116* 111 108 106 107  108 109  CO2 20* 20* 20* 19* 20* 20*  21* 21*  GLUCOSE 97 114* 272* 266* 307* 269*  267* 241*   BUN 31* 30* 40* 54* 70* 77*  78* 88*  CREATININE 2.85* 2.91* 3.35* 3.20* 3.06* 2.77*  2.72* 2.71*  CALCIUM 8.2* 8.5* 8.4* 8.4* 8.5* 8.8*  8.9 8.8*  PHOS  --   --   --  5.1* 5.4* 5.1* 5.1*   CBC Recent Labs  Lab 02/05/20 1517 02/05/20 1517 02/06/20 0525 02/07/20 0555 02/08/20 1145 02/09/20 0750  WBC 9.6  --  8.7 8.1  --  14.3*  NEUTROABS 7.1  --   --   --   --   --   HGB 9.8*  --  8.7* 8.3*  --  8.5*  HCT 32.9*   < > 28.9* 28.9* 29.0* 27.5*  MCV 104.8*  --  104.7* 106.6*  --  102.2*  PLT 255  --  222 204  --  207   < > = values in this interval not displayed.    Medications:    . amLODipine  10 mg Oral Daily  . aspirin EC  81 mg Oral Daily  . atorvastatin  20 mg Oral Daily  . budesonide (PULMICORT) nebulizer solution  0.25 mg Nebulization BID  . cholecalciferol  10,000 Units Oral Daily  . famotidine  20 mg Oral Daily  .  guaiFENesin  1,200 mg Oral BID  . heparin  5,000 Units Subcutaneous Q8H  . hydrALAZINE  25 mg Oral TID  . insulin aspart  0-9 Units Subcutaneous TID WC  . ipratropium-albuterol  3 mL Nebulization BID  . multivitamin with minerals  1 tablet Oral Daily  . nystatin   Topical TID  . predniSONE  40 mg Oral Q breakfast  . rOPINIRole  0.5 mg Oral QHS  . sodium zirconium cyclosilicate  5 g Oral Once  . tamsulosin  0.4 mg Oral QPM  . thyroid  90 mg Oral QAC breakfast  . traZODone  50 mg Oral QHS      Rexene Agent  MD 02/12/2020, 9:52 AM

## 2020-02-12 NOTE — Discharge Summary (Addendum)
Physician Discharge Summary  Regina Bush OZY:[id number] DOB: November 01, 1927 DOA: 02/05/2020  PCP: Doree Albee, MD  Admit date: 02/05/2020 Discharge date: 02/12/2020  Time spent:50* minutes  Recommendations for Outpatient Follow-up:  1. Check BMP in 1 week as outpatient 2. Follow-up PCP in 1 week    Discharge Diagnoses:  Principal Problem:   AKI (acute kidney injury) ([place]) Active Problems:   Hypothyroidism   Pneumonia   Hyperkalemia   Discharge Condition: Stable  Diet recommendation: Heart healthy diet  Filed Weights   02/05/20 1432 02/05/20 2300 02/12/20 0534  Weight: 63.5 kg 75.8 kg 79.9 kg    History of present illness:  86 year old female with a history of thyroid disease, SDH, protein calorie malnutrition, hypertension, failure to thrive, diabetes mellitus , CAD, CVA came with dyspnea    Hospital Course:   1. Acute hypoxemic respiratory failure-significantly improved improved, CT chest high-resolution obtained showed bilateral pulmonary edema, inflammatory bronchitis.  Patient started on Zithromax and Rocephin.  Will not discharge on antibiotics at this time. BNP is mildly elevated to 227.0.  Patient was started on  DuoNeb every 6 hours, Solu-Medrol IV, Solu-Medrol has been changed to p.o. prednisone.  Continue  Mucinex 1 tablet p.o. twice daily. 2. Grade 1 diastolic dysfunction/pulmonary edema-seen on CT chest, echocardiogram from 2019 showed grade 1 diastolic dysfunction.  Called discussed with nephrologist on-call Dr. Candiss Norse.  Patient does have history of CKD stage IV with baseline creatinine 2.91 today.  She was given 1 dose of Lasix 40 mg IV with no significant diuretic response, her breathing seems to have improved.  Will avoid giving further diuretics due to worsening renal function. 3. Acute kidney injury on CKD stage IV-patient baseline creatinine around 2.8, today creatinine is 2.71.  Avoid nephrotoxins.  Will avoid giving further diuretics.  Recheck BMP in 1  week 4. Hyperkalemia-potassium is 5.0 today.  Will give 1 dose of Lokelma 5 g p.o. x1 before discharge.  Need to check BMP in 1 week. 5. History of chronic UTI-patient is on suppressive therapy with trimethoprim. 6. Hypertension-blood pressure stable, continue home medications. 7. Hypothyroidism-continue Synthroid.  TSH 4.416. 8. Diabetes mellitus type 2-blood glucose was elevated due to steroids.  Patient is not on home medications.  Hemoglobin A1c is 7.0.  Patient is on as needed insulin at ALF.  Procedures:    Consultations: Nephrology  Discharge Exam: Vitals:   02/12/20 0800 02/12/20 0857  BP: 130/61   Pulse: 64   Resp: 16   Temp: 98.2 F (36.8 C)   SpO2: 100% 98%    General: Appears in no acute distress Cardiovascular: S1-S2, regular Respiratory: Clear to auscultation bilaterally  Discharge Instructions   Discharge Instructions    Diet - low sodium heart healthy   Complete by: As directed    Increase activity slowly   Complete by: As directed      Allergies as of 02/12/2020      Reactions   Cymbalta [duloxetine Hcl] Other (See Comments)   unknown   Effexor [venlafaxine] Other (See Comments)   unknown   Morphine And Related Other (See Comments)   Passed out    Tape    adhesive      Medication List    TAKE these medications   acetaminophen 325 MG tablet Commonly known as: TYLENOL Take 2 tablets (650 mg total) by mouth every 6 (six) hours as needed for mild pain or moderate pain.   amLODipine 10 MG tablet Commonly known as: NORVASC Take 10 mg by  mouth daily.   aspirin EC 81 MG tablet Take 81 mg by mouth daily.   atorvastatin 20 MG tablet Commonly known as: LIPITOR Take 20 mg by mouth daily.   BENEFIBER PO Take 10 mLs by mouth daily.   cetirizine 10 MG tablet Commonly known as: ZYRTEC Take 10 mg by mouth at bedtime as needed for allergies.   famotidine 20 MG tablet Commonly known as: PEPCID Take 20 mg by mouth daily.   fluticasone 50  MCG/ACT nasal spray Commonly known as: FLONASE Place 2 sprays into both nostrils daily.   guaiFENesin 600 MG 12 hr tablet Commonly known as: MUCINEX Take 2 tablets (1,200 mg total) by mouth 2 (two) times daily for 7 days.   hydrALAZINE 25 MG tablet Commonly known as: APRESOLINE Take 25 mg by mouth 3 (three) times daily. Give 1 tablet by mouth three times a day for HTN hold for systolic BP <993   ipratropium-albuterol 0.5-2.5 (3) MG/3ML Soln Commonly known as: DUONEB Take 3 mLs by nebulization every 6 (six) hours as needed (shortness of breath).   magnesium oxide 400 MG tablet Commonly known as: MAG-OX Take 400 mg by mouth daily.   multivitamin with minerals Tabs tablet Take 1 tablet by mouth daily.   NP Thyroid 90 MG tablet Generic drug: thyroid Take 1 tablet (90 mg total) by mouth daily.   predniSONE 10 MG tablet Commonly known as: DELTASONE Prednisone 40 mg po daily x 1 day then Prednisone 30 mg po daily x 1 day then Prednisone 20 mg po daily x 1 day then Prednisone 10 mg daily x 1 day then stop...   rOPINIRole 0.5 MG tablet Commonly known as: Requip Take 1 tablet (0.5 mg total) by mouth at bedtime.   tamsulosin 0.4 MG Caps capsule Commonly known as: FLOMAX Take 0.4 mg by mouth every evening.   traZODone 50 MG tablet Commonly known as: DESYREL Take 50 mg by mouth at bedtime.   trimethoprim 100 MG tablet Commonly known as: TRIMPEX Take 1 tablet (100 mg total) by mouth daily.   Vitamin D3 125 MCG (5000 UT) Tabs Take 2 tablets (10,000 Units total) by mouth daily.      Allergies  Allergen Reactions  . Cymbalta [Duloxetine Hcl] Other (See Comments)    unknown  . Effexor [Venlafaxine] Other (See Comments)    unknown  . Morphine And Related Other (See Comments)    Passed out   . Tape     adhesive      The results of significant diagnostics from this hospitalization (including imaging, microbiology, ancillary and laboratory) are listed below for reference.     Significant Diagnostic Studies: US RENAL  Result Date: 02/08/2020 CLINICAL DATA:  Acute renal injury. EXAM: RENAL / URINARY TRACT ULTRASOUND COMPLETE COMPARISON:  Ultrasound 02/15/2018. FINDINGS: Right Kidney: Renal measurements: 8.5 x 4.0 x 3.9 cm = volume: 70 mL. Renal cortical thinning. Echogenicity within normal limits. 3.1 cm simple cyst. No hydronephrosis visualized. Left Kidney: Renal measurements: 11.2 x 4.7 x 4.5 cm = volume: 124 mL. Renal cortical thinning. Echogenicity within normal limits. 2.1 cm simple cyst. Hydronephrosis visualized. Bladder: Appears normal for degree of bladder distention. Other: None. IMPRESSION: 1. No acute abnormality. No hydronephrosis or bladder distention. 2. Bilateral simple renal cysts. Bilateral renal cortical thinning. Right kidney is slightly atrophic. Electronically Signed   By: Marcello Moores  Register   On: 02/08/2020 12:09   CT Chest High Resolution  Result Date: 02/06/2020 CLINICAL DATA:  Respiratory failure, interstitial lung disease  EXAM: CT CHEST WITHOUT CONTRAST TECHNIQUE: Multidetector CT imaging of the chest was performed following the standard protocol without intravenous contrast. High resolution imaging of the lungs, as well as inspiratory and expiratory imaging, was performed. COMPARISON:  None. FINDINGS: Cardiovascular: Aortic atherosclerosis. Cardiomegaly. Extensive 3 vessel coronary artery calcifications and/or stents. Trace pericardial effusion. Enlargement of the main pulmonary artery measuring up to 3.6 cm in caliber. Mediastinum/Nodes: Enlarged mediastinal lymph nodes, largest pretracheal nodes measuring up to 2.2 x 1.6 cm (series 4, image 48). Thyroid gland, trachea, and esophagus demonstrate no significant findings. Lungs/Pleura: Diffuse bilateral bronchial wall thickening. Mild centrilobular emphysema. Small bilateral pleural effusions and associated atelectasis or consolidation. Diffuse interlobular septal thickening. Bandlike scarring and or  atelectasis of the left lower lobe and right middle lobe (series 7, image 169). No significant air trapping on expiratory phase imaging. Upper Abdomen: No acute abnormality. Musculoskeletal: No chest wall mass or suspicious bone lesions identified. IMPRESSION: 1. Small bilateral pleural effusions and associated atelectasis or consolidation. 2. Diffuse interlobular septal thickening, consistent with pulmonary edema. 3. Diffuse bilateral bronchial wall thickening, nonspecific and may reflect infectious or inflammatory bronchitis or edema. 4. Bandlike scarring and or atelectasis of the left lower lobe and right middle lobe, most likely sequelae of prior infection. 5. No specific findings to suggest fibrotic interstitial lung disease, although assessment is limited given the presence of edema and effusions. 6. Cardiomegaly and coronary artery disease. 7. Enlargement of the main pulmonary artery, as can be seen in pulmonary hypertension. 8. Enlarged mediastinal lymph nodes, nonspecific and likely reactive. Aortic Atherosclerosis (ICD10-I70.0). Electronically Signed   By: Eddie Candle M.D.   On: 02/06/2020 13:56   DG Chest Port 1 View  Result Date: 02/05/2020 CLINICAL DATA:  Shortness of breath x1 month. EXAM: PORTABLE CHEST 1 VIEW COMPARISON:  March 21, 2019 FINDINGS: Moderate severity diffuse chronic appearing increased interstitial lung markings are seen. Mild atelectasis and/or infiltrate is seen within the lateral aspect of the left lung base. There is a small left pleural effusion. No pneumothorax is identified. The heart size and mediastinal contours are within normal limits. There is marked severity calcification of the aortic arch. Chronic left-sided rib fractures are seen. IMPRESSION: 1. Chronic interstitial lung disease with mild left basilar atelectasis and/or infiltrate. 2. Small left pleural effusion. Electronically Signed   By: Virgina Norfolk M.D.   On: 02/05/2020 15:43    Microbiology: Recent  Results (from the past 240 hour(s))  Resp Panel by RT-PCR (Flu A&B, Covid) Nasopharyngeal Swab     Status: None   Collection Time: 02/05/20  3:45 PM   Specimen: Nasopharyngeal Swab; Nasopharyngeal(NP) swabs in vial transport medium  Result Value Ref Range Status   SARS Coronavirus 2 by RT PCR NEGATIVE NEGATIVE Final    Comment: (NOTE) SARS-CoV-2 target nucleic acids are NOT DETECTED.  The SARS-CoV-2 RNA is generally detectable in upper respiratory specimens during the acute phase of infection. The lowest concentration of SARS-CoV-2 viral copies this assay can detect is 138 copies/mL. A negative result does not preclude SARS-Cov-2 infection and should not be used as the sole basis for treatment or other patient management decisions. A negative result may occur with  improper specimen collection/handling, submission of specimen other than nasopharyngeal swab, presence of viral mutation(s) within the areas targeted by this assay, and inadequate number of viral copies(<138 copies/mL). A negative result must be combined with clinical observations, patient history, and epidemiological information. The expected result is Negative.  Fact Sheet for Patients:  EntrepreneurPulse.com.au  Fact Sheet for Healthcare Providers:  IncredibleEmployment.be  This test is no t yet approved or cleared by the [place] FDA and  has been authorized for detection and/or diagnosis of SARS-CoV-2 by FDA under an Emergency Use Authorization (EUA). This EUA will remain  in effect (meaning this test can be used) for the duration of the COVID-19 declaration under Section 564(b)(1) of the Act, 21 U.S.C.section 360bbb-3(b)(1), unless the authorization is terminated  or revoked sooner.       Influenza A by PCR NEGATIVE NEGATIVE Final   Influenza B by PCR NEGATIVE NEGATIVE Final    Comment: (NOTE) The Xpert Xpress SARS-CoV-2/FLU/RSV plus assay is intended as an aid in the  diagnosis of influenza from Nasopharyngeal swab specimens and should not be used as a sole basis for treatment. Nasal washings and aspirates are unacceptable for Xpert Xpress SARS-CoV-2/FLU/RSV testing.  Fact Sheet for Patients: EntrepreneurPulse.com.au  Fact Sheet for Healthcare Providers: IncredibleEmployment.be  This test is not yet approved or cleared by the [place] FDA and has been authorized for detection and/or diagnosis of SARS-CoV-2 by FDA under an Emergency Use Authorization (EUA). This EUA will remain in effect (meaning this test can be used) for the duration of the COVID-19 declaration under Section 564(b)(1) of the Act, 21 U.S.C. section 360bbb-3(b)(1), unless the authorization is terminated or revoked.  Performed at [facility], [address] [place], [place] [address]   Blood culture (routine x 2)     Status: None   Collection Time: 02/05/20  4:36 PM   Specimen: BLOOD RIGHT HAND  Result Value Ref Range Status   Specimen Description BLOOD RIGHT HAND  Final   Special Requests   Final    BOTTLES DRAWN AEROBIC AND ANAEROBIC Blood Culture adequate volume   Culture   Final    NO GROWTH 5 DAYS Performed at [facility], [address] [place], [place] [address]    Report Status 02/10/2020 FINAL  Final  Blood culture (routine x 2)     Status: None   Collection Time: 02/05/20  4:37 PM   Specimen: BLOOD  Result Value Ref Range Status   Specimen Description BLOOD LEFT ANTECUBITAL  Final   Special Requests   Final    BOTTLES DRAWN AEROBIC AND ANAEROBIC Blood Culture results may not be optimal due to an inadequate volume of blood received in culture bottles   Culture   Final    NO GROWTH 5 DAYS Performed at [facility], [address], [place], [place] [address]    Report Status 02/10/2020 FINAL  Final  MRSA PCR Screening     Status: Abnormal   Collection Time: 02/06/20 10:04 AM   Specimen: Nasal Mucosa; Nasopharyngeal   Result Value Ref Range Status   MRSA by PCR POSITIVE (A) NEGATIVE Final    Comment:        The GeneXpert MRSA Assay (FDA approved for NASAL specimens only), is one component of a comprehensive MRSA colonization surveillance program. It is not intended to diagnose MRSA infection nor to guide or monitor treatment for MRSA infections. RESULT CALLED TO, READ BACK BY AND VERIFIED WITH: K GRAVES,[email or web address]@[email or web address]  02/06/20 MKELLY Performed at [facility], [address] [place],  [address]      Labs: Basic Metabolic Panel: Recent Labs  Lab 02/08/20 0637 02/09/20 0750 02/10/20 0733 02/11/20 0734 02/12/20 0515  NA 139 135 134* 135  137 138  K 5.7* 4.7 4.7 4.8  4.9 5.0  CL 111 108 106 107  108 109  CO2 20* 19* 20* 20*  21* 21*  GLUCOSE 272* 266* 307* 269*  267* 241*  BUN 40* 54* 70* 77*  78* 88*  CREATININE 3.35* 3.20* 3.06* 2.77*  2.72* 2.71*  CALCIUM 8.4* 8.4* 8.5* 8.8*  8.9 8.8*  PHOS  --  5.1* 5.4* 5.1* 5.1*   Liver Function Tests: Recent Labs  Lab 02/06/20 0525 02/09/20 0750 02/10/20 0733 02/11/20 0734 02/12/20 0515  AST 11*  --   --   --   --   ALT 11  --   --   --   --   ALKPHOS 51  --   --   --   --   BILITOT 0.1*  --   --   --   --   PROT 5.3*  --   --   --   --   ALBUMIN 2.7* 2.7* 2.8* 2.9* 2.7*   No results for input(s): LIPASE, AMYLASE in the last 168 hours. No results for input(s): AMMONIA in the last 168 hours. CBC: Recent Labs  Lab 02/05/20 1517 02/06/20 0525 02/07/20 0555 02/08/20 1145 02/09/20 0750  WBC 9.6 8.7 8.1  --  14.3*  NEUTROABS 7.1  --   --   --   --   HGB 9.8* 8.7* 8.3*  --  8.5*  HCT 32.9* 28.9* 28.9* 29.0* 27.5*  MCV 104.8* 104.7* 106.6*  --  102.2*  PLT 255 222 204  --  207   Cardiac Enzymes: No results for input(s): CKTOTAL, CKMB, CKMBINDEX, TROPONINI in the last 168 hours. BNP: BNP (last 3 results) Recent Labs    03/18/19 1530 02/05/20 1517  BNP 225.0* 227.0*    ProBNP (last 3 results) No results for  input(s): PROBNP in the last 8760 hours.  CBG: Recent Labs  Lab 02/11/20 0740 02/11/20 1134 02/11/20 1628 02/11/20 2100 02/12/20 0739  GLUCAP 222* 271* 241* 262* 192*       Signed:  Oswald Hillock MD.  [facility] 02/12/2020, 9:46 AM

## 2020-02-12 NOTE — NC FL2 (Signed)
[place] LEVEL OF CARE SCREENING TOOL     IDENTIFICATION  Patient Name: Regina Bush Birthdate: 1927/03/30 Sex: female Admission Date (Current Location): 02/05/2020  [facility] and [place] Number:  [employer] and Address:  [place] [address] [place]      Provider Number: [id number]  Attending Physician Name and Address:  Oswald Hillock, MD  Relative Name and Phone Number:       Current Level of Care: Hospital Recommended Level of Care: [place] Prior Approval Number:    Date Approved/Denied:   PASRR Number:    Discharge Plan: Other (Comment) (ALF)    Current Diagnoses: Patient Active Problem List   Diagnosis Date Noted  . Weakness 04/19/2019  . DNR (do not resuscitate) 03/21/2019  . Goals of care, counseling/discussion   . Palliative care by specialist   . Dyspnea   . Hyperkalemia   . Hypoxia   . Shortness of breath   . Acute respiratory failure with hypoxia ([place]) 03/19/2019  . Pneumonia 03/19/2019  . AKI (acute kidney injury) ([place]) 03/18/2019  . Urinary retention 02/20/2018  . Hypothyroidism 11/19/2016  . History of CVA in adulthood 11/19/2016  . UTI (urinary tract infection) 11/15/2016  . ARF (acute renal failure) ([place]) 11/15/2016  . Hyponatremia 11/15/2016  . HCAP (healthcare-associated pneumonia) 08/19/2016  . Subdural hematoma ([place])   . Hypertension   . Diabetes mellitus without complication ([place])   . Coronary artery disease     Orientation RESPIRATION BLADDER Height & Weight     Self, Time, Situation, Place   (3L) Incontinent Weight: 176 lb 2.4 oz (79.9 kg) Height:  5\' 3"  (160 cm)  BEHAVIORAL SYMPTOMS/MOOD NEUROLOGICAL BOWEL NUTRITION STATUS      Continent Diet (no added salt)  AMBULATORY STATUS COMMUNICATION OF NEEDS Skin   Limited Assist Verbally Normal                       Personal Care Assistance Level of Assistance  Bathing, Feeding, Dressing Bathing  Assistance: Limited assistance Feeding assistance: Independent Dressing Assistance: Limited assistance     Functional Limitations Info  Sight, Hearing, Speech Sight Info: Adequate Hearing Info: Adequate Speech Info: Adequate    SPECIAL CARE FACTORS FREQUENCY                       Contractures Contractures Info: Not present    Additional Factors Info  Code Status, Allergies Code Status Info: full Allergies Info: Cymbalta, Effexor, Morphine and related, Tape           Current Medications (02/12/2020):  This is the current hospital active medication list Current Facility-Administered Medications  Medication Dose Route Frequency Provider Last Rate Last Admin  . acetaminophen (TYLENOL) tablet 650 mg  650 mg Oral Q6H PRN Zierle-Ghosh, Asia B, DO   650 mg at 02/12/20 0115  . amLODipine (NORVASC) tablet 10 mg  10 mg Oral Daily Zierle-Ghosh, Asia B, DO   10 mg at 02/12/20 0801  . aspirin EC tablet 81 mg  81 mg Oral Daily Zierle-Ghosh, Asia B, DO   81 mg at 02/12/20 0802  . atorvastatin (LIPITOR) tablet 20 mg  20 mg Oral Daily Zierle-Ghosh, Asia B, DO   20 mg at 02/12/20 0802  . budesonide (PULMICORT) nebulizer solution 0.25 mg  0.25 mg Nebulization BID Oswald Hillock, MD   0.25 mg at 02/12/20 0856  . cholecalciferol (VITAMIN D3) tablet  10,000 Units  10,000 Units Oral Daily Zierle-Ghosh, Asia B, DO   10,000 Units at 02/12/20 0802  . famotidine (PEPCID) tablet 20 mg  20 mg Oral Daily Zierle-Ghosh, Asia B, DO   20 mg at 02/12/20 0802  . guaiFENesin (MUCINEX) 12 hr tablet 1,200 mg  1,200 mg Oral BID Oswald Hillock, MD   1,200 mg at 02/12/20 0801  . heparin injection 5,000 Units  5,000 Units Subcutaneous Q8H Zierle-Ghosh, Asia B, DO   5,000 Units at 02/12/20 0539  . hydrALAZINE (APRESOLINE) tablet 25 mg  25 mg Oral TID Zierle-Ghosh, Asia B, DO   25 mg at 02/12/20 0802  . insulin aspart (novoLOG) injection 0-9 Units  0-9 Units Subcutaneous TID WC Oswald Hillock, MD   2 Units at 02/12/20  0801  . ipratropium-albuterol (DUONEB) 0.5-2.5 (3) MG/3ML nebulizer solution 3 mL  3 mL Nebulization BID Oswald Hillock, MD   3 mL at 02/12/20 0856  . LORazepam (ATIVAN) injection 0.5 mg  0.5 mg Intravenous Q6H PRN Zierle-Ghosh, Asia B, DO   0.5 mg at 02/11/20 0408  . multivitamin with minerals tablet 1 tablet  1 tablet Oral Daily Zierle-Ghosh, Asia B, DO   1 tablet at 02/12/20 0802  . nystatin (MYCOSTATIN/NYSTOP) topical powder   Topical TID Oswald Hillock, MD   Given at 02/12/20 [phone]  . ondansetron (ZOFRAN) injection 4 mg  4 mg Intravenous Q6H PRN Zierle-Ghosh, Asia B, DO      . predniSONE (DELTASONE) tablet 40 mg  40 mg Oral Q breakfast Oswald Hillock, MD   40 mg at 02/12/20 0801  . rOPINIRole (REQUIP) tablet 0.5 mg  0.5 mg Oral QHS Zierle-Ghosh, Asia B, DO   0.5 mg at 02/11/20 2132  . tamsulosin (FLOMAX) capsule 0.4 mg  0.4 mg Oral QPM Zierle-Ghosh, Asia B, DO   0.4 mg at 02/11/20 1703  . thyroid (ARMOUR) tablet 90 mg  90 mg Oral QAC breakfast Zierle-Ghosh, Asia B, DO   90 mg at 02/12/20 0538  . traZODone (DESYREL) tablet 50 mg  50 mg Oral QHS Zierle-Ghosh, Asia B, DO   50 mg at 02/11/20 2132     Discharge Medications:  TAKE these medications   acetaminophen 325 MG tablet Commonly known as: TYLENOL Take 2 tablets (650 mg total) by mouth every 6 (six) hours as needed for mild pain or moderate pain.   amLODipine 10 MG tablet Commonly known as: NORVASC Take 10 mg by mouth daily.   aspirin EC 81 MG tablet Take 81 mg by mouth daily.   atorvastatin 20 MG tablet Commonly known as: LIPITOR Take 20 mg by mouth daily.   BENEFIBER PO Take 10 mLs by mouth daily.   cetirizine 10 MG tablet Commonly known as: ZYRTEC Take 10 mg by mouth at bedtime as needed for allergies.   famotidine 20 MG tablet Commonly known as: PEPCID Take 20 mg by mouth daily.   fluticasone 50 MCG/ACT nasal spray Commonly known as: FLONASE Place 2 sprays into both nostrils daily.   guaiFENesin 600 MG 12  hr tablet Commonly known as: MUCINEX Take 2 tablets (1,200 mg total) by mouth 2 (two) times daily for 7 days.   hydrALAZINE 25 MG tablet Commonly known as: APRESOLINE Take 25 mg by mouth 3 (three) times daily. Give 1 tablet by mouth three times a day for HTN hold for systolic BP <297   ipratropium-albuterol 0.5-2.5 (3) MG/3ML Soln Commonly known as: DUONEB Take 3 mLs by nebulization every  6 (six) hours as needed (shortness of breath).   magnesium oxide 400 MG tablet Commonly known as: MAG-OX Take 400 mg by mouth daily.   multivitamin with minerals Tabs tablet Take 1 tablet by mouth daily.   NP Thyroid 90 MG tablet Generic drug: thyroid Take 1 tablet (90 mg total) by mouth daily.   predniSONE 10 MG tablet Commonly known as: DELTASONE Prednisone 40 mg po daily x 1 day then Prednisone 30 mg po daily x 1 day then Prednisone 20 mg po daily x 1 day then Prednisone 10 mg daily x 1 day then stop...   rOPINIRole 0.5 MG tablet Commonly known as: Requip Take 1 tablet (0.5 mg total) by mouth at bedtime.   tamsulosin 0.4 MG Caps capsule Commonly known as: FLOMAX Take 0.4 mg by mouth every evening.   traZODone 50 MG tablet Commonly known as: DESYREL Take 50 mg by mouth at bedtime.   trimethoprim 100 MG tablet Commonly known as: TRIMPEX Take 1 tablet (100 mg total) by mouth daily.   Vitamin D3 125 MCG (5000 UT) Tabs Take 2 tablets (10,000 Units total) by mouth daily.        Relevant Imaging Results:  Relevant Lab Results:   Additional [place], [place]

## 2020-02-12 NOTE — Plan of Care (Signed)

## 2020-02-12 NOTE — TOC Transition Note (Signed)
Transition of Care [facility]) - CM/SW Discharge Note   Patient Details  Name: Mercedies Ganesh MRN: [id number] Date of Birth: 01-Nov-1927  Transition of Care (TOC) CM/SW Contact:  Shade Flood, LCSW Phone Number: 02/12/2020, 1:22 PM   Clinical Narrative:     Pt stable for dc today per MD. Pt needs O2 and a neb machine and HH PT for dc. Spoke with pt's daughter, Butch Penny, to update on dc. Per Butch Penny, they have communicated with [facility] about [facility] transport picking up pt at dc. Discussed DME and [facility] CMS provider options for services. Referred to [place] [facility] [place] at family request.  Updated [name] at [facility]. DC clinical sent electronically. Updated pt's RN.   There are no other TOC needs for dc.  Final next level of care: Assisted Living Barriers to Discharge: Barriers Resolved   Patient Goals and CMS Choice   CMS Medicare.gov Compare Post Acute Care list provided to:: Patient Represenative (must comment) Choice offered to / list presented to : Adult Children  Discharge Placement                       Discharge Plan and Services In-house Referral: Clinical Social Work              DME Arranged: Nebulizer/meds, Oxygen DME Agency: [employer] Date DME Agency Contacted: 02/12/20   Representative spoke with at DME Agency: [place]: PT [place]: [place] Date [place]: 02/12/20   Representative spoke with at [place]: [place] ([place]) Interventions     Readmission Risk Interventions Readmission Risk Prevention Plan 02/06/2020  Transportation Screening Complete  Medication Review (RN CM) Complete  Some recent data might be hidden

## 2020-02-13 ENCOUNTER — Other Ambulatory Visit (INDEPENDENT_AMBULATORY_CARE_PROVIDER_SITE_OTHER): Payer: Self-pay | Admitting: Internal Medicine

## 2020-02-13 DIAGNOSIS — R54 Age-related physical debility: Secondary | ICD-10-CM

## 2020-02-13 DIAGNOSIS — J189 Pneumonia, unspecified organism: Secondary | ICD-10-CM | POA: Diagnosis not present

## 2020-02-15 DIAGNOSIS — I10 Essential (primary) hypertension: Secondary | ICD-10-CM | POA: Diagnosis not present

## 2020-02-18 ENCOUNTER — Telehealth (INDEPENDENT_AMBULATORY_CARE_PROVIDER_SITE_OTHER): Payer: Self-pay | Admitting: Internal Medicine

## 2020-02-19 NOTE — Telephone Encounter (Signed)
No action required at this time.

## 2020-02-21 ENCOUNTER — Encounter (INDEPENDENT_AMBULATORY_CARE_PROVIDER_SITE_OTHER): Payer: Medicare Other | Admitting: Ophthalmology

## 2020-02-22 ENCOUNTER — Encounter: Payer: Self-pay | Admitting: Adult Health

## 2020-02-22 ENCOUNTER — Non-Acute Institutional Stay (INDEPENDENT_AMBULATORY_CARE_PROVIDER_SITE_OTHER): Payer: Self-pay | Admitting: Adult Health

## 2020-02-22 ENCOUNTER — Inpatient Hospital Stay
Admission: RE | Admit: 2020-02-22 | Discharge: 2020-04-16 | Disposition: A | Discharge status: Skilled Nursing Facility | Payer: Medicare Other | Source: Ambulatory Visit | Attending: Internal Medicine | Admitting: Internal Medicine

## 2020-02-22 DIAGNOSIS — E785 Hyperlipidemia, unspecified: Secondary | ICD-10-CM

## 2020-02-22 DIAGNOSIS — I10 Essential (primary) hypertension: Secondary | ICD-10-CM | POA: Diagnosis not present

## 2020-02-22 DIAGNOSIS — N189 Chronic kidney disease, unspecified: Secondary | ICD-10-CM | POA: Diagnosis not present

## 2020-02-22 DIAGNOSIS — J3089 Other allergic rhinitis: Secondary | ICD-10-CM

## 2020-02-22 DIAGNOSIS — N281 Cyst of kidney, acquired: Secondary | ICD-10-CM | POA: Diagnosis not present

## 2020-02-22 DIAGNOSIS — J849 Interstitial pulmonary disease, unspecified: Secondary | ICD-10-CM | POA: Diagnosis not present

## 2020-02-22 DIAGNOSIS — N184 Chronic kidney disease, stage 4 (severe): Secondary | ICD-10-CM | POA: Diagnosis not present

## 2020-02-22 DIAGNOSIS — I2510 Atherosclerotic heart disease of native coronary artery without angina pectoris: Secondary | ICD-10-CM

## 2020-02-22 DIAGNOSIS — D638 Anemia in other chronic diseases classified elsewhere: Secondary | ICD-10-CM | POA: Diagnosis not present

## 2020-02-22 DIAGNOSIS — D631 Anemia in chronic kidney disease: Secondary | ICD-10-CM | POA: Diagnosis not present

## 2020-02-22 DIAGNOSIS — E1169 Type 2 diabetes mellitus with other specified complication: Secondary | ICD-10-CM | POA: Diagnosis not present

## 2020-02-22 DIAGNOSIS — R279 Unspecified lack of coordination: Secondary | ICD-10-CM | POA: Diagnosis not present

## 2020-02-22 DIAGNOSIS — I700 Atherosclerosis of aorta: Secondary | ICD-10-CM | POA: Diagnosis not present

## 2020-02-22 DIAGNOSIS — M1990 Unspecified osteoarthritis, unspecified site: Secondary | ICD-10-CM | POA: Diagnosis not present

## 2020-02-22 DIAGNOSIS — R0602 Shortness of breath: Secondary | ICD-10-CM | POA: Diagnosis not present

## 2020-02-22 DIAGNOSIS — E1122 Type 2 diabetes mellitus with diabetic chronic kidney disease: Secondary | ICD-10-CM

## 2020-02-22 DIAGNOSIS — F0150 Vascular dementia without behavioral disturbance: Secondary | ICD-10-CM

## 2020-02-22 DIAGNOSIS — J9601 Acute respiratory failure with hypoxia: Secondary | ICD-10-CM | POA: Diagnosis not present

## 2020-02-22 DIAGNOSIS — R41841 Cognitive communication deficit: Secondary | ICD-10-CM | POA: Diagnosis not present

## 2020-02-22 DIAGNOSIS — I517 Cardiomegaly: Secondary | ICD-10-CM | POA: Diagnosis not present

## 2020-02-22 DIAGNOSIS — E039 Hypothyroidism, unspecified: Secondary | ICD-10-CM

## 2020-02-22 DIAGNOSIS — R1310 Dysphagia, unspecified: Secondary | ICD-10-CM | POA: Diagnosis not present

## 2020-02-22 DIAGNOSIS — I5021 Acute systolic (congestive) heart failure: Secondary | ICD-10-CM | POA: Diagnosis not present

## 2020-02-22 DIAGNOSIS — I5032 Chronic diastolic (congestive) heart failure: Secondary | ICD-10-CM | POA: Diagnosis not present

## 2020-02-22 DIAGNOSIS — I639 Cerebral infarction, unspecified: Secondary | ICD-10-CM | POA: Diagnosis not present

## 2020-02-22 DIAGNOSIS — Z79899 Other long term (current) drug therapy: Secondary | ICD-10-CM | POA: Diagnosis not present

## 2020-02-22 DIAGNOSIS — Z741 Need for assistance with personal care: Secondary | ICD-10-CM | POA: Diagnosis not present

## 2020-02-22 DIAGNOSIS — Z66 Do not resuscitate: Secondary | ICD-10-CM | POA: Diagnosis not present

## 2020-02-22 DIAGNOSIS — I129 Hypertensive chronic kidney disease with stage 1 through stage 4 chronic kidney disease, or unspecified chronic kidney disease: Secondary | ICD-10-CM | POA: Diagnosis not present

## 2020-02-22 DIAGNOSIS — K5909 Other constipation: Secondary | ICD-10-CM

## 2020-02-22 DIAGNOSIS — F0390 Unspecified dementia without behavioral disturbance: Secondary | ICD-10-CM | POA: Diagnosis not present

## 2020-02-22 DIAGNOSIS — E46 Unspecified protein-calorie malnutrition: Secondary | ICD-10-CM | POA: Diagnosis not present

## 2020-02-22 DIAGNOSIS — Z8673 Personal history of transient ischemic attack (TIA), and cerebral infarction without residual deficits: Secondary | ICD-10-CM

## 2020-02-22 DIAGNOSIS — M6281 Muscle weakness (generalized): Secondary | ICD-10-CM | POA: Diagnosis not present

## 2020-02-22 DIAGNOSIS — E876 Hypokalemia: Secondary | ICD-10-CM | POA: Diagnosis not present

## 2020-02-22 DIAGNOSIS — R2241 Localized swelling, mass and lump, right lower limb: Principal | ICD-10-CM

## 2020-02-22 DIAGNOSIS — I509 Heart failure, unspecified: Secondary | ICD-10-CM | POA: Diagnosis not present

## 2020-02-22 DIAGNOSIS — E875 Hyperkalemia: Secondary | ICD-10-CM | POA: Diagnosis not present

## 2020-02-22 DIAGNOSIS — N390 Urinary tract infection, site not specified: Secondary | ICD-10-CM | POA: Diagnosis not present

## 2020-02-22 DIAGNOSIS — J90 Pleural effusion, not elsewhere classified: Secondary | ICD-10-CM | POA: Diagnosis not present

## 2020-02-22 DIAGNOSIS — E1140 Type 2 diabetes mellitus with diabetic neuropathy, unspecified: Secondary | ICD-10-CM | POA: Diagnosis not present

## 2020-02-22 DIAGNOSIS — R627 Adult failure to thrive: Secondary | ICD-10-CM | POA: Diagnosis not present

## 2020-02-22 DIAGNOSIS — J189 Pneumonia, unspecified organism: Secondary | ICD-10-CM | POA: Diagnosis not present

## 2020-02-22 DIAGNOSIS — R399 Unspecified symptoms and signs involving the genitourinary system: Secondary | ICD-10-CM

## 2020-02-22 DIAGNOSIS — E43 Unspecified severe protein-calorie malnutrition: Secondary | ICD-10-CM | POA: Diagnosis not present

## 2020-02-22 DIAGNOSIS — N185 Chronic kidney disease, stage 5: Secondary | ICD-10-CM | POA: Diagnosis not present

## 2020-02-22 NOTE — Progress Notes (Signed)
Location:  [place] Room Number: 151/P Place of Service:  SNF (31)   CODE STATUS: DNR  Allergies  Allergen Reactions  . Cymbalta [Duloxetine Hcl] Other (See Comments)    unknown  . Effexor [Venlafaxine] Other (See Comments)    unknown  . Morphine And Related Other (See Comments)    Passed out   . Tape     adhesive    Chief Complaint  Patient presents with  . Acute Visit    Transfer from Assisted Living    HPI:  She is a 86 year old woman being transferred form assisted living for acute short term rehab. She was hospitalized for acute respiratory failure; acute on chronic renal failure. She went to her assisted living; however; she required more assistance than they could provide. She is here for short term rehab with her goal to return to assisted living. She is a poor historian. Her family tells me that she has been short of breath for the month. She stopped smoking 30 years ago. She has been wheelchair bound for the past year or so. She denies any pain; is hungry; denies insomnia. She will continue to be followed for her chronic illnesses including: Type 2 diabetes mellitus with stage 4 chronic kidney disease and hypertension:  Hypertension associated with stage 4 chronic kidney disease due to type 2 diabetes mellitus: .  Hyperlipidemia associated with type 2 diabetes mellitus  Past Medical History:  Diagnosis Date  . Anemia   . Arthritis   . Cerebral infarction ([place])   . Coronary artery disease   . Dementia ([place])   . Diabetes mellitus without complication (HCC)    Type 2  . Dysphagia due to recent cerebral infarction   . Failure to thrive (0-17)   . Hypercalcemia   . Hypertension   . Pneumonitis   . Protein calorie malnutrition ([place])   . Subdural hematoma ([place])   . Thyroid disease     History reviewed. No pertinent surgical history.  Social History   Socioeconomic History  . Marital status: Married    Spouse name: Not on file  .  Number of children: Not on file  . Years of education: Not on file  . Highest education level: Not on file  Occupational History  . Not on file  Tobacco Use  . Smoking status: Never Smoker  . Smokeless tobacco: Never Used  Vaping Use  . Vaping Use: Never used  Substance and Sexual Activity  . Alcohol use: No  . Drug use: No  . Sexual activity: Not Currently  Other Topics Concern  . Not on file  Social History Narrative   Widow,husband passed away 2020,married for 57 years.Retired.   Social Determinants of Health   Financial Resource Strain: Not on file  Food Insecurity: Not on file  Transportation Needs: Not on file  Physical Activity: Not on file  Stress: Not on file  Social Connections: Not on file  Intimate Partner Violence: Not on file   Family History  Family history unknown: Yes      VITAL SIGNS BP (!) 164/55   Pulse 60   Temp 97.7 F (36.5 C)   Resp 20   SpO2 98%   Outpatient Encounter Medications as of 02/22/2020  Medication Sig  . amLODipine (NORVASC) 10 MG tablet Take 10 mg by mouth daily.   Marland Kitchen aspirin EC 81 MG tablet Take 81 mg by mouth daily.  Marland Kitchen atorvastatin (LIPITOR) 20 MG tablet Take 20  mg by mouth daily.   . Cholecalciferol 125 MCG (5000 UT) capsule Take 5,000 Units by mouth daily.  . fluticasone (FLONASE) 50 MCG/ACT nasal spray Place 2 sprays into both nostrils daily.  . magnesium oxide (MAG-OX) 400 MG tablet Take 400 mg by mouth daily.   . Multiple Vitamin (MULTIVITAMIN WITH MINERALS) TABS tablet Take 1 tablet by mouth daily.   . NON FORMULARY Diet: _____ Regular, ___x___ NAS, _______Consistent Carbohydrate, _______NPO _____Other  . NP THYROID 90 MG tablet Take 1 tablet (90 mg total) by mouth daily.  . OXYGEN Inhale 3 L into the lungs continuous.  . tamsulosin (FLOMAX) 0.4 MG CAPS capsule Take 0.4 mg by mouth every evening.  . Wheat Dextrin (BENEFIBER PO) Take 1 packet by mouth daily.   No facility-administered encounter medications on  file as of 02/22/2020.     SIGNIFICANT DIAGNOSTIC EXAMS  TODAY  11-14-16:  Ct of head:  1. No acute intracranial abnormality identified. 2. 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 3. Moderate for age chronic microvascular ischemic changes and parenchymal volume loss of the brain. 4. Bilateral mastoid effusions. 5. 12 mm nodule left suboccipital scalp, direct visualization recommended, probably sebaceous cyst.  02-05-20: chest x-ray:  1. Chronic interstitial lung disease with mild left basilar atelectasis and/or infiltrate. 2. Small left pleural effusion.  02-06-20: ct of chest:  1. Small bilateral pleural effusions and associated atelectasis or consolidation. 2. Diffuse interlobular septal thickening, consistent with pulmonary edema. 3. Diffuse bilateral bronchial wall thickening, nonspecific and may reflect infectious or inflammatory bronchitis or edema. 4. Bandlike scarring and or atelectasis of the left lower lobe and right middle lobe, most likely sequelae of prior infection. 5. No specific findings to suggest fibrotic interstitial lung disease, although assessment is limited given the presence of edema and effusions. 6. Cardiomegaly and coronary artery disease. 7. Enlargement of the main pulmonary artery, as can be seen in pulmonary hypertension. 8. Enlarged mediastinal lymph nodes, nonspecific and likely reactive.  9. Aortic Atherosclerosis   02-08-20: renal ultrasound:  1. No acute abnormality. No hydronephrosis or bladder distention. 2. Bilateral simple renal cysts. Bilateral renal cortical thinning. Right kidney is slightly atrophic.  LABS REVIEWED TODAY  02-09-20: wbc 14.3; hgb 8.5; hct 27.5; mcv 102.2 plt 207;  02-10-20: hgb a1c 7.0 02-12-20: glucose 241; bun 88; creat 2.71; k+ 5.0; na++ 138; ca 8.8 phos 5.1 albumin 2.7    Review of Systems  Constitutional: Negative for  malaise/fatigue.  Respiratory: Negative for cough and shortness of breath.   Cardiovascular: Negative for chest pain and leg swelling.  Gastrointestinal: Negative for constipation and heartburn.  Musculoskeletal: Negative for back pain, joint pain and myalgias.  Skin: Negative.   Neurological: Negative for dizziness.  Psychiatric/Behavioral: The patient is not nervous/anxious.     Physical Exam Constitutional:      General: She is not in acute distress.    Appearance: She is well-developed and well-nourished. She is not diaphoretic.  Neck:     Thyroid: No thyromegaly.  Cardiovascular:     Rate and Rhythm: Normal rate and regular rhythm.     Pulses: Normal pulses and intact distal pulses.     Heart sounds: Murmur heard.    Pulmonary:     Effort: Pulmonary effort is normal. No respiratory distress.     Breath sounds: Normal breath sounds.  Abdominal:     General: Bowel sounds are normal. There is no distension.  Palpations: Abdomen is soft.     Tenderness: There is no abdominal tenderness.  Musculoskeletal:        General: No edema.     Cervical back: Neck supple.     Right lower leg: No edema.     Left lower leg: No edema.     Comments: Is able to move all extremities   Lymphadenopathy:     Cervical: No cervical adenopathy.  Skin:    General: Skin is warm and dry.  Neurological:     Mental Status: She is alert. Mental status is at baseline.  Psychiatric:        Mood and Affect: Mood and affect and mood normal.       ASSESSMENT/ PLAN:  TODAY  1. Type 2 diabetes mellitus with stage 4 chronic kidney disease and hypertension: is stable hgb a1c 7.0 will monitor   2. Hypertension associated with stage 4 chronic kidney disease due to type 2 diabetes mellitus: is stable b/p164/55 will continue asa 81 mg daily norvasc 10 mg daily  3.  Hyperlipidemia associated with type 2 diabetes mellitus: is stable will continue lipitor 20 mg daily   4. Aortic atherosclerosis: is  stable will monitor  5. Coronary artery disease involving native coronary artery of native heart without angina: is stable will continue asa 81 mg daily   6. History of CVA in adulthood: is stable will continue asa 81 mg daily   7. Acquired hypothyroidism: is stable will continue NP thyroid 90 mg daily   8. Chronic interstitial lung disease: is without change does have some shortness of breath; is 02 dependent  9. Chronic constipation: is stable will continue benefiber daily  10. Vascular dementia without behavioral disturbance will monitor   11. Lower urinary tract symptoms (LUTS) is stable will continue flomax 0.4 mg daily   12. Chronic non-seasonal allergic rhinitis is stable will continue flonase daily  Will check cbc; cmp tsh.     MD is aware of resident's narcotic use and is in agreement with current plan of care. We will attempt to wean resident as appropriate.  Ok Edwards NP [facility] Adult Medicine  Contact [phone] Monday through Friday 8am- 5pm  After hours call [phone]

## 2020-02-25 ENCOUNTER — Other Ambulatory Visit: Payer: Self-pay | Admitting: Adult Health

## 2020-02-25 ENCOUNTER — Encounter (HOSPITAL_COMMUNITY)
Admission: RE | Admit: 2020-02-25 | Discharge: 2020-02-25 | Disposition: A | Discharge status: Home / Self Care | Payer: Medicare Other | Source: Skilled Nursing Facility | Attending: *Deleted | Admitting: *Deleted

## 2020-02-25 ENCOUNTER — Encounter: Payer: Self-pay | Admitting: Adult Health

## 2020-02-25 ENCOUNTER — Non-Acute Institutional Stay (SKILLED_NURSING_FACILITY): Payer: Medicare Other | Admitting: Adult Health

## 2020-02-25 DIAGNOSIS — E039 Hypothyroidism, unspecified: Secondary | ICD-10-CM

## 2020-02-25 DIAGNOSIS — I129 Hypertensive chronic kidney disease with stage 1 through stage 4 chronic kidney disease, or unspecified chronic kidney disease: Secondary | ICD-10-CM

## 2020-02-25 DIAGNOSIS — J849 Interstitial pulmonary disease, unspecified: Secondary | ICD-10-CM | POA: Insufficient documentation

## 2020-02-25 DIAGNOSIS — I700 Atherosclerosis of aorta: Secondary | ICD-10-CM | POA: Insufficient documentation

## 2020-02-25 DIAGNOSIS — E875 Hyperkalemia: Secondary | ICD-10-CM

## 2020-02-25 DIAGNOSIS — N185 Chronic kidney disease, stage 5: Secondary | ICD-10-CM | POA: Insufficient documentation

## 2020-02-25 DIAGNOSIS — N184 Chronic kidney disease, stage 4 (severe): Secondary | ICD-10-CM

## 2020-02-25 DIAGNOSIS — E43 Unspecified severe protein-calorie malnutrition: Secondary | ICD-10-CM

## 2020-02-25 DIAGNOSIS — K5909 Other constipation: Secondary | ICD-10-CM | POA: Insufficient documentation

## 2020-02-25 DIAGNOSIS — H3500 Unspecified background retinopathy: Secondary | ICD-10-CM | POA: Insufficient documentation

## 2020-02-25 DIAGNOSIS — E1122 Type 2 diabetes mellitus with diabetic chronic kidney disease: Secondary | ICD-10-CM

## 2020-02-25 DIAGNOSIS — E785 Hyperlipidemia, unspecified: Secondary | ICD-10-CM | POA: Insufficient documentation

## 2020-02-25 DIAGNOSIS — J3089 Other allergic rhinitis: Secondary | ICD-10-CM | POA: Insufficient documentation

## 2020-02-25 DIAGNOSIS — E1169 Type 2 diabetes mellitus with other specified complication: Secondary | ICD-10-CM | POA: Insufficient documentation

## 2020-02-25 DIAGNOSIS — R399 Unspecified symptoms and signs involving the genitourinary system: Secondary | ICD-10-CM | POA: Insufficient documentation

## 2020-02-25 DIAGNOSIS — F0150 Vascular dementia without behavioral disturbance: Secondary | ICD-10-CM | POA: Insufficient documentation

## 2020-02-25 LAB — CBC
HCT: 28.4 % — ABNORMAL LOW (ref 36.0–46.0)
Hemoglobin: 8.1 g/dL — ABNORMAL LOW (ref 12.0–15.0)
MCH: 30.8 pg (ref 26.0–34.0)
MCHC: 28.5 g/dL — ABNORMAL LOW (ref 30.0–36.0)
MCV: 108 fL — ABNORMAL HIGH (ref 80.0–100.0)
Platelets: 167 10*3/uL (ref 150–400)
RBC: 2.63 MIL/uL — ABNORMAL LOW (ref 3.87–5.11)
RDW: 14.6 % (ref 11.5–15.5)
WBC: 7.4 10*3/uL (ref 4.0–10.5)
nRBC: 0 % (ref 0.0–0.2)

## 2020-02-25 LAB — COMPREHENSIVE METABOLIC PANEL
ALT: 23 U/L (ref 0–44)
AST: 16 U/L (ref 15–41)
Albumin: 2.8 g/dL — ABNORMAL LOW (ref 3.5–5.0)
Alkaline Phosphatase: 58 U/L (ref 38–126)
Anion gap: 5 (ref 5–15)
BUN: 44 mg/dL — ABNORMAL HIGH (ref 8–23)
CO2: 22 mmol/L (ref 22–32)
Calcium: 8.6 mg/dL — ABNORMAL LOW (ref 8.9–10.3)
Chloride: 113 mmol/L — ABNORMAL HIGH (ref 98–111)
Creatinine, Ser: 2.7 mg/dL — ABNORMAL HIGH (ref 0.44–1.00)
GFR, Estimated: 16 mL/min — ABNORMAL LOW (ref >60)
Glucose, Bld: 102 mg/dL — ABNORMAL HIGH (ref 70–99)
Potassium: 6.4 mmol/L (ref 3.5–5.1)
Sodium: 140 mmol/L (ref 135–145)
Total Bilirubin: 0.4 mg/dL (ref 0.3–1.2)
Total Protein: 5.1 g/dL — ABNORMAL LOW (ref 6.5–8.1)

## 2020-02-25 LAB — VITAMIN D 25 HYDROXY (VIT D DEFICIENCY, FRACTURES): Vit D, 25-Hydroxy: 77.07 ng/mL (ref 30–100)

## 2020-02-25 LAB — TSH: TSH: 7.031 u[IU]/mL — ABNORMAL HIGH (ref 0.350–4.500)

## 2020-02-25 NOTE — Progress Notes (Signed)
Location:  [place] Room Number: 151/P Place of Service:  SNF (31)   CODE STATUS: DNR  Allergies  Allergen Reactions  . Cymbalta [Duloxetine Hcl] Other (See Comments)    unknown  . Effexor [Venlafaxine] Other (See Comments)    unknown  . Morphine And Related Other (See Comments)    Passed out   . Tape     adhesive    Chief Complaint  Patient presents with  . Acute Visit    Labs    HPI:  Her tsh is elevated at 7; her k+ is 6.4 she denies ant pain; denies an insomnia; denies any weakness. There are no reports of missed thyroid medications; is not one k+ supplement.    Past Medical History:  Diagnosis Date  . Anemia   . Arthritis   . Cerebral infarction ([place])   . Coronary artery disease   . Dementia ([place])   . Diabetes mellitus without complication (HCC)    Type 2  . Dysphagia due to recent cerebral infarction   . Failure to thrive (0-17)   . Hypercalcemia   . Hypertension   . Pneumonitis   . Protein calorie malnutrition ([place])   . Subdural hematoma ([place])   . Thyroid disease     History reviewed. No pertinent surgical history.  Social History   Socioeconomic History  . Marital status: Married    Spouse name: Not on file  . Number of children: Not on file  . Years of education: Not on file  . Highest education level: Not on file  Occupational History  . Not on file  Tobacco Use  . Smoking status: Never Smoker  . Smokeless tobacco: Never Used  Vaping Use  . Vaping Use: Never used  Substance and Sexual Activity  . Alcohol use: No  . Drug use: No  . Sexual activity: Not Currently  Other Topics Concern  . Not on file  Social History Narrative   Widow,husband passed away 2020,married for 57 years.Retired.   Social Determinants of Health   Financial Resource Strain: Not on file  Food Insecurity: Not on file  Transportation Needs: Not on file  Physical Activity: Not on file  Stress: Not on file  Social Connections: Not on  file  Intimate Partner Violence: Not on file   Family History  Family history unknown: Yes      VITAL SIGNS BP (!) 172/86   Pulse (!) 106   Temp 98 F (36.7 C)   Resp (!) 21   Ht 5\' 3"  (1.6 m)   Wt 176 lb (79.8 kg)   SpO2 96%   BMI 31.18 kg/m   Outpatient Encounter Medications as of 02/25/2020  Medication Sig  . acetaminophen (TYLENOL) 325 MG tablet Take 650 mg by mouth every 6 (six) hours as needed.  . Amino Acids-Protein Hydrolys (FEEDING SUPPLEMENT, PRO-STAT SUGAR FREE 64,) LIQD Take 30 mLs by mouth in the morning and at bedtime.  Marland Kitchen amLODipine (NORVASC) 10 MG tablet Take 10 mg by mouth daily.   Marland Kitchen aspirin EC 81 MG tablet Take 81 mg by mouth daily.  Marland Kitchen atorvastatin (LIPITOR) 20 MG tablet Take 20 mg by mouth daily.   . cetirizine (ZYRTEC) 10 MG tablet Take 10 mg by mouth daily as needed for allergies.  . Cholecalciferol 125 MCG (5000 UT) capsule Take 5,000 Units by mouth daily.  . famotidine (PEPCID) 20 MG tablet Take 20 mg by mouth daily.  . fluticasone (FLONASE) 50 MCG/ACT nasal  spray Place 2 sprays into both nostrils daily.  . hydrALAZINE (APRESOLINE) 25 MG tablet Take 25 mg by mouth 3 (three) times daily.  Marland Kitchen ipratropium-albuterol (DUONEB) 0.5-2.5 (3) MG/3ML SOLN Take 3 mLs by nebulization every 6 (six) hours as needed.  . magnesium oxide (MAG-OX) 400 MG tablet Take 400 mg by mouth daily.   . Multiple Vitamin (MULTIVITAMIN WITH MINERALS) TABS tablet Take 1 tablet by mouth daily.   . NON FORMULARY Diet: _____ Regular, ___x___ NAS, _______Consistent Carbohydrate, _______NPO _____Other  . NP THYROID 90 MG tablet Take 1 tablet (90 mg total) by mouth daily.  . OXYGEN Inhale 3 L into the lungs continuous.  Marland Kitchen rOPINIRole (REQUIP) 0.5 MG tablet Take 0.5 mg by mouth at bedtime.  . tamsulosin (FLOMAX) 0.4 MG CAPS capsule Take 0.4 mg by mouth every evening.  . traZODone (DESYREL) 50 MG tablet Take 50 mg by mouth at bedtime.  Marland Kitchen trimethoprim (TRIMPEX) 100 MG tablet Take 100 mg by  mouth daily. For UTI  . Wheat Dextrin (BENEFIBER PO) Take 1 packet by mouth daily.   No facility-administered encounter medications on file as of 02/25/2020.     SIGNIFICANT DIAGNOSTIC EXAMS  TODAY  11-14-16:  Ct of head:  1. No acute intracranial abnormality identified. 2. 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 3. Moderate for age chronic microvascular ischemic changes and parenchymal volume loss of the brain. 4. Bilateral mastoid effusions. 5. 12 mm nodule left suboccipital scalp, direct visualization recommended, probably sebaceous cyst.  02-05-20: chest x-ray:  1. Chronic interstitial lung disease with mild left basilar atelectasis and/or infiltrate. 2. Small left pleural effusion.  02-06-20: ct of chest:  1. Small bilateral pleural effusions and associated atelectasis or consolidation. 2. Diffuse interlobular septal thickening, consistent with pulmonary edema. 3. Diffuse bilateral bronchial wall thickening, nonspecific and may reflect infectious or inflammatory bronchitis or edema. 4. Bandlike scarring and or atelectasis of the left lower lobe and right middle lobe, most likely sequelae of prior infection. 5. No specific findings to suggest fibrotic interstitial lung disease, although assessment is limited given the presence of edema and effusions. 6. Cardiomegaly and coronary artery disease. 7. Enlargement of the main pulmonary artery, as can be seen in pulmonary hypertension. 8. Enlarged mediastinal lymph nodes, nonspecific and likely reactive.  9. Aortic Atherosclerosis   02-08-20: renal ultrasound:  1. No acute abnormality. No hydronephrosis or bladder distention. 2. Bilateral simple renal cysts. Bilateral renal cortical thinning. Right kidney is slightly atrophic.  NO NEW EXAMS.   LABS REVIEWED PREVIOUS   02-09-20: wbc 14.3; hgb 8.5; hct 27.5; mcv 102.2 plt 207;  02-10-20: hgb a1c  7.0 02-12-20: glucose 241; bun 88; creat 2.71; k+ 5.0; na++ 138; ca 8.8 phos 5.1 albumin 2.7   TODAY   02-25-20: wbc 14.3; hgb 8.5; hct 27.5; mcv 102.2 plt 207; glucose 102; bun 44; creat 2.70; k+ 6.4; na++ 140 ca 8.6 liver normal albumin 2.8 tsh 7.031vit D 77.07  Review of Systems  Constitutional: Negative for malaise/fatigue.  Respiratory: Negative for cough and shortness of breath.   Cardiovascular: Negative for chest pain, palpitations and leg swelling.  Gastrointestinal: Negative for abdominal pain, constipation and heartburn.  Musculoskeletal: Negative for back pain, joint pain and myalgias.  Skin: Negative.   Neurological: Negative for dizziness.  Psychiatric/Behavioral: The patient is not nervous/anxious.     Physical Exam Constitutional:      General: She is not in acute distress.  Appearance: She is well-developed and well-nourished. She is not diaphoretic.  Neck:     Thyroid: No thyromegaly.  Cardiovascular:     Rate and Rhythm: Normal rate and regular rhythm.     Pulses: Normal pulses and intact distal pulses.     Heart sounds: Murmur heard.    Pulmonary:     Effort: Pulmonary effort is normal. No respiratory distress.     Breath sounds: Normal breath sounds.  Abdominal:     General: Bowel sounds are normal. There is no distension.     Palpations: Abdomen is soft.     Tenderness: There is no abdominal tenderness.  Musculoskeletal:        General: No edema.     Right lower leg: No edema.     Left lower leg: No edema.     Comments: Is able to move all extremities   Lymphadenopathy:     Cervical: No cervical adenopathy.  Skin:    General: Skin is warm and dry.  Neurological:     Mental Status: She is alert. Mental status is at baseline.  Psychiatric:        Mood and Affect: Mood and affect and mood normal.        ASSESSMENT/ PLAN:  TODAY  1. Hyperkalemia:  2. Hypertension with stage 3 chronic kidney disease due to type 2 diabetes mellitus 3.  CKD stage 4 due to type 2 diabetes mellitus 4. Acquired hypothyroidism  Will check free t3 and free t4 Will give kayexalate 60 gm one time For albumin 2.8 will begin prostat 30 mL twice daily Will setup 2-d echo.  Will repeat cmp      MD is aware of resident's narcotic use and is in agreement with current plan of care. We will attempt to wean resident as appropriate.  Ok Edwards NP [facility] Adult Medicine  Contact [phone] Monday through Friday 8am- 5pm  After hours call [phone]

## 2020-02-26 ENCOUNTER — Ambulatory Visit (HOSPITAL_COMMUNITY): Payer: Medicare Other | Attending: Internal Medicine

## 2020-02-26 ENCOUNTER — Other Ambulatory Visit (HOSPITAL_COMMUNITY)
Admission: RE | Admit: 2020-02-26 | Discharge: 2020-02-26 | Disposition: A | Discharge status: Home / Self Care | Payer: Medicare Other | Source: Skilled Nursing Facility | Attending: Adult Health | Admitting: Adult Health

## 2020-02-26 DIAGNOSIS — E119 Type 2 diabetes mellitus without complications: Secondary | ICD-10-CM | POA: Insufficient documentation

## 2020-02-26 DIAGNOSIS — E875 Hyperkalemia: Secondary | ICD-10-CM | POA: Insufficient documentation

## 2020-02-26 DIAGNOSIS — I110 Hypertensive heart disease with heart failure: Secondary | ICD-10-CM | POA: Insufficient documentation

## 2020-02-26 DIAGNOSIS — I2510 Atherosclerotic heart disease of native coronary artery without angina pectoris: Secondary | ICD-10-CM | POA: Insufficient documentation

## 2020-02-26 DIAGNOSIS — I5021 Acute systolic (congestive) heart failure: Secondary | ICD-10-CM | POA: Diagnosis not present

## 2020-02-26 DIAGNOSIS — Z8673 Personal history of transient ischemic attack (TIA), and cerebral infarction without residual deficits: Secondary | ICD-10-CM | POA: Insufficient documentation

## 2020-02-26 DIAGNOSIS — E785 Hyperlipidemia, unspecified: Secondary | ICD-10-CM | POA: Insufficient documentation

## 2020-02-26 LAB — COMPREHENSIVE METABOLIC PANEL
ALT: 28 U/L (ref 0–44)
AST: 22 U/L (ref 15–41)
Albumin: 2.7 g/dL — ABNORMAL LOW (ref 3.5–5.0)
Alkaline Phosphatase: 60 U/L (ref 38–126)
Anion gap: 5 (ref 5–15)
BUN: 37 mg/dL — ABNORMAL HIGH (ref 8–23)
CO2: 24 mmol/L (ref 22–32)
Calcium: 8.6 mg/dL — ABNORMAL LOW (ref 8.9–10.3)
Chloride: 112 mmol/L — ABNORMAL HIGH (ref 98–111)
Creatinine, Ser: 2.54 mg/dL — ABNORMAL HIGH (ref 0.44–1.00)
GFR, Estimated: 17 mL/min — ABNORMAL LOW (ref >60)
Glucose, Bld: 99 mg/dL (ref 70–99)
Potassium: 5.8 mmol/L — ABNORMAL HIGH (ref 3.5–5.1)
Sodium: 141 mmol/L (ref 135–145)
Total Bilirubin: 0.8 mg/dL (ref 0.3–1.2)
Total Protein: 5.1 g/dL — ABNORMAL LOW (ref 6.5–8.1)

## 2020-02-26 NOTE — Progress Notes (Signed)
*  PRELIMINARY RESULTS* Echocardiogram 2D Echocardiogram has been performed.  Regina Bush 02/26/2020, 1:04 PM

## 2020-02-27 DIAGNOSIS — N184 Chronic kidney disease, stage 4 (severe): Secondary | ICD-10-CM | POA: Insufficient documentation

## 2020-02-27 DIAGNOSIS — E1122 Type 2 diabetes mellitus with diabetic chronic kidney disease: Secondary | ICD-10-CM | POA: Insufficient documentation

## 2020-02-27 DIAGNOSIS — E43 Unspecified severe protein-calorie malnutrition: Secondary | ICD-10-CM | POA: Insufficient documentation

## 2020-02-28 ENCOUNTER — Encounter: Payer: Self-pay | Admitting: Adult Health

## 2020-02-28 ENCOUNTER — Other Ambulatory Visit (HOSPITAL_COMMUNITY)
Admission: RE | Admit: 2020-02-28 | Discharge: 2020-02-28 | Disposition: A | Discharge status: Home / Self Care | Payer: Medicare Other | Source: Skilled Nursing Facility | Attending: Adult Health | Admitting: Adult Health

## 2020-02-28 ENCOUNTER — Non-Acute Institutional Stay (SKILLED_NURSING_FACILITY): Payer: Medicare Other | Admitting: Adult Health

## 2020-02-28 DIAGNOSIS — I129 Hypertensive chronic kidney disease with stage 1 through stage 4 chronic kidney disease, or unspecified chronic kidney disease: Secondary | ICD-10-CM

## 2020-02-28 DIAGNOSIS — N184 Chronic kidney disease, stage 4 (severe): Secondary | ICD-10-CM | POA: Diagnosis not present

## 2020-02-28 DIAGNOSIS — E1122 Type 2 diabetes mellitus with diabetic chronic kidney disease: Secondary | ICD-10-CM | POA: Diagnosis not present

## 2020-02-28 DIAGNOSIS — E875 Hyperkalemia: Secondary | ICD-10-CM | POA: Diagnosis not present

## 2020-02-28 DIAGNOSIS — E039 Hypothyroidism, unspecified: Secondary | ICD-10-CM | POA: Insufficient documentation

## 2020-02-28 LAB — BASIC METABOLIC PANEL
Anion gap: 3 — ABNORMAL LOW (ref 5–15)
BUN: 39 mg/dL — ABNORMAL HIGH (ref 8–23)
CO2: 26 mmol/L (ref 22–32)
Calcium: 8.6 mg/dL — ABNORMAL LOW (ref 8.9–10.3)
Chloride: 111 mmol/L (ref 98–111)
Creatinine, Ser: 2.66 mg/dL — ABNORMAL HIGH (ref 0.44–1.00)
GFR, Estimated: 17 mL/min — ABNORMAL LOW (ref >60)
Glucose, Bld: 120 mg/dL — ABNORMAL HIGH (ref 70–99)
Potassium: 6.9 mmol/L (ref 3.5–5.1)
Sodium: 140 mmol/L (ref 135–145)

## 2020-02-28 LAB — T4, FREE: Free T4: 0.93 ng/dL (ref 0.61–1.12)

## 2020-02-28 NOTE — Progress Notes (Signed)
Location:  [facility] Room Number: 151/P Place of Service:  SNF (31)   CODE STATUS: DNR  Allergies  Allergen Reactions  . Cymbalta [Duloxetine Hcl] Other (See Comments)    unknown  . Effexor [Venlafaxine] Other (See Comments)    unknown  . Morphine And Related Other (See Comments)    Passed out   . Tape     adhesive    Chief Complaint  Patient presents with  . 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 She is hyperkalemic at 6.9. her appetite is poor. There are no reports of uncontrolled pain; she remains on 02.   Past Medical History:  Diagnosis Date  . Anemia   . Arthritis   . Cerebral infarction ([place])   . Coronary artery disease   . Dementia ([place])   . Diabetes mellitus without complication (HCC)    Type 2  . Dysphagia due to recent cerebral infarction   . Failure to thrive (0-17)   . Hypercalcemia   . Hypertension   . Pneumonitis   . Protein calorie malnutrition ([place])   . Subdural hematoma ([place])   . Thyroid disease     History reviewed. No pertinent surgical history.  Social History   Socioeconomic History  . Marital status: Married    Spouse name: Not on file  . Number of children: Not on file  . Years of education: Not on file  . Highest education level: Not on file  Occupational History  . Not on file   Tobacco Use  . Smoking status: Never Smoker  . Smokeless tobacco: Never Used  Vaping Use  . Vaping Use: Never used  Substance and Sexual Activity  . Alcohol use: No  . Drug use: No  . Sexual activity: Not Currently  Other Topics Concern  . Not on file  Social History Narrative   Widow,husband passed away 2020,married for 57 years.Retired.   Social Determinants of Health   Financial Resource Strain: Not on file  Food Insecurity: Not on file  Transportation Needs: Not on file  Physical Activity: Not on file  Stress: Not on file  Social Connections: Not on file  Intimate Partner Violence: Not on file   Family History  Family history unknown: Yes      VITAL SIGNS BP (!) 152/85   Pulse (!) 58   Temp (!) 97.4 F (36.3 C)   Resp 20   Ht 5\' 3"  (1.6 m)   Wt 162 lb 3.2 oz (73.6 kg)   SpO2 97%   BMI 28.73 kg/m   Outpatient Encounter Medications as of 02/28/2020  Medication Sig  . acetaminophen (TYLENOL) 325 MG tablet Take 650 mg by mouth every 6 (six) hours as needed.  . Amino Acids-Protein Hydrolys (FEEDING SUPPLEMENT, PRO-STAT SUGAR FREE 64,) LIQD  Take 30 mLs by mouth in the morning and at bedtime.  Marland Kitchen amLODipine (NORVASC) 10 MG tablet Take 10 mg by mouth daily.   Marland Kitchen aspirin EC 81 MG tablet Take 81 mg by mouth daily.  Marland Kitchen atorvastatin (LIPITOR) 20 MG tablet Take 20 mg by mouth daily.   . cetirizine (ZYRTEC) 10 MG tablet Take 10 mg by mouth daily as needed for allergies.  . Cholecalciferol 125 MCG (5000 UT) capsule Take 5,000 Units by mouth daily.  . famotidine (PEPCID) 20 MG tablet Take 20 mg by mouth daily.  . fluticasone (FLONASE) 50 MCG/ACT nasal spray Place 2 sprays into both nostrils daily.  . hydrALAZINE (APRESOLINE) 25 MG tablet Take 25 mg by mouth 3 (three) times daily.  Marland Kitchen ipratropium-albuterol (DUONEB) 0.5-2.5 (3) MG/3ML SOLN Take 3 mLs by nebulization every 6 (six) hours as needed.  . magnesium oxide (MAG-OX) 400 MG tablet Take 400 mg by mouth daily.   .  Multiple Vitamin (MULTIVITAMIN WITH MINERALS) TABS tablet Take 1 tablet by mouth daily.   . NON FORMULARY Diet: _____ Regular, ___x___ NAS, _______Consistent Carbohydrate, _______NPO _____Other  . NP THYROID 90 MG tablet Take 1 tablet (90 mg total) by mouth daily.  . OXYGEN Inhale 3 L into the lungs continuous.  Marland Kitchen rOPINIRole (REQUIP) 0.5 MG tablet Take 0.5 mg by mouth at bedtime.  . tamsulosin (FLOMAX) 0.4 MG CAPS capsule Take 0.4 mg by mouth every evening.  . traZODone (DESYREL) 50 MG tablet Take 50 mg by mouth at bedtime.  Marland Kitchen trimethoprim (TRIMPEX) 100 MG tablet Take 100 mg by mouth daily. For UTI  . Wheat Dextrin (BENEFIBER PO) Take 1 packet by mouth daily.   No facility-administered encounter medications on file as of 02/28/2020.     SIGNIFICANT DIAGNOSTIC EXAMS  PREVIOUS   11-14-16:  Ct of head:  1. No acute intracranial abnormality identified. 2. 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 3. Moderate for age chronic microvascular ischemic changes and parenchymal volume loss of the brain. 4. Bilateral mastoid effusions. 5. 12 mm nodule left suboccipital scalp, direct visualization recommended, probably sebaceous cyst.  02-05-20: chest x-ray:  1. Chronic interstitial lung disease with mild left basilar atelectasis and/or infiltrate. 2. Small left pleural effusion.  02-06-20: ct of chest:  1. Small bilateral pleural effusions and associated atelectasis or consolidation. 2. Diffuse interlobular septal thickening, consistent with pulmonary edema. 3. Diffuse bilateral bronchial wall thickening, nonspecific and may reflect infectious or inflammatory bronchitis or edema. 4. Bandlike scarring and or atelectasis of the left lower lobe and right middle lobe, most likely sequelae of prior infection. 5. No specific findings to suggest fibrotic interstitial lung disease, although assessment is limited given the  presence of edema and effusions. 6. Cardiomegaly and coronary artery disease. 7. Enlargement of the main pulmonary artery, as can be seen in pulmonary hypertension. 8. Enlarged mediastinal lymph nodes, nonspecific and likely reactive.  9. Aortic Atherosclerosis   02-08-20: renal ultrasound:  1. No acute abnormality. No hydronephrosis or bladder distention. 2. Bilateral simple renal cysts. Bilateral renal cortical thinning. Right kidney is slightly atrophic.  NO NEW EXAMS.   LABS REVIEWED PREVIOUS   02-09-20: wbc 14.3; hgb 8.5; hct 27.5; mcv 102.2 plt 207;  02-10-20: hgb a1c 7.0 02-12-20: glucose 241; bun 88; creat 2.71; k+ 5.0; na++ 138; ca 8.8 phos 5.1 albumin 2.7  02-25-20: wbc 14.3; hgb 8.5; hct 27.5; mcv 102.2 plt 207; glucose 102; bun 44;  creat 2.70; k+ 6.4; na++ 140 ca 8.6 liver normal albumin 2.8 tsh 7.031vit D 77.07  TODAY  02-26-20: glucose 99; bun 37; creat 2.54; k+ 5.8; na++ 141; liver normal albumin 2.7 02-28-20: glucose 120; bun 39; creat 2.66; k+ 6.9; na++ 140; ca 8.6   Review of Systems  Constitutional: Negative for malaise/fatigue.  Respiratory: Negative for cough and shortness of breath.   Cardiovascular: Negative for chest pain, palpitations and leg swelling.  Gastrointestinal: Negative for abdominal pain, constipation and heartburn.  Musculoskeletal: Negative for back pain, joint pain and myalgias.  Skin: Negative.   Neurological: Negative for dizziness.  Psychiatric/Behavioral: The patient is not nervous/anxious.     Physical Exam Constitutional:      General: She is not in acute distress.    Appearance: She is well-developed and well-nourished. She is not diaphoretic.  Neck:     Thyroid: No thyromegaly.  Cardiovascular:     Rate and Rhythm: Normal rate and regular rhythm.     Pulses: Normal pulses and intact distal pulses.     Heart sounds: Murmur heard.    Pulmonary:     Effort: Pulmonary effort is normal. No respiratory distress.     Breath  sounds: Normal breath sounds.  Abdominal:     General: Bowel sounds are normal. There is no distension.     Palpations: Abdomen is soft.     Tenderness: There is no abdominal tenderness.  Musculoskeletal:        General: No edema.     Cervical back: Neck supple.     Right lower leg: No edema.     Left lower leg: No edema.     Comments: Able to move extremities Kyphosis   Lymphadenopathy:     Cervical: No cervical adenopathy.  Skin:    General: Skin is warm and dry.  Neurological:     Mental Status: She is alert. Mental status is at baseline.  Psychiatric:        Mood and Affect: Mood and affect and mood normal.       ASSESSMENT/ PLAN:  TODAY  1. CKD stage 4 due to diabetes mellitus/hyperkalemia: is without change bun 37; creat 2.66; k+ 6.9 will give kayexalate 120 gm one time and will repeat bmp in the AM   2. Type 2 diabetes mellitus with stage 4 chronic kidney disease and hypertension: is stable hgb a1c 7.9 will monitor   3. Hypertension associated with stage 4 chronic kidney disease due to type 2 diabetes mellitus: is stable b/p 152/85 will continue asa 81 mg daily norvasc 10 mg daily apresoline 25 mg three times daily  PREVIOUS   4.  Hyperlipidemia associated with type 2 diabetes mellitus: is stable will continue lipitor 20 mg daily   5. Aortic atherosclerosis: is stable will monitor  6. Coronary artery disease involving native coronary artery of native heart without angina: is stable will continue asa 81 mg daily   7. History of CVA in adulthood: is stable will continue asa 81 mg daily   8. Acquired hypothyroidism: is stable will continue NP thyroid 90 mg daily   9. Chronic interstitial lung disease: is without change does have some shortness of breath; is 02 dependent  10. Chronic constipation: is stable will continue benefiber daily  11. Vascular dementia without behavioral disturbance will monitor   12. Lower urinary tract symptoms (LUTS) is stable will  continue flomax 0.4 mg daily   13. Chronic non-seasonal allergic rhinitis is stable will continue flonase daily  MD is aware of resident's narcotic use and is in agreement with current plan of care. We will attempt to wean resident as appropriate.  Shalynn Jorstad NP [facility]  Contact [phone] Monday through Friday 8am- 5pm  After hours call [phone]   

## 2020-02-29 ENCOUNTER — Encounter: Payer: Self-pay | Admitting: Internal Medicine

## 2020-02-29 ENCOUNTER — Non-Acute Institutional Stay (SKILLED_NURSING_FACILITY): Payer: Medicare Other | Admitting: Internal Medicine

## 2020-02-29 DIAGNOSIS — E1122 Type 2 diabetes mellitus with diabetic chronic kidney disease: Secondary | ICD-10-CM

## 2020-02-29 DIAGNOSIS — J9601 Acute respiratory failure with hypoxia: Secondary | ICD-10-CM

## 2020-02-29 DIAGNOSIS — I129 Hypertensive chronic kidney disease with stage 1 through stage 4 chronic kidney disease, or unspecified chronic kidney disease: Secondary | ICD-10-CM

## 2020-02-29 DIAGNOSIS — E039 Hypothyroidism, unspecified: Secondary | ICD-10-CM

## 2020-02-29 DIAGNOSIS — N184 Chronic kidney disease, stage 4 (severe): Secondary | ICD-10-CM | POA: Diagnosis not present

## 2020-02-29 LAB — ECHOCARDIOGRAM COMPLETE
AR max vel: 1.86 cm2
AV Area VTI: 1.7 cm2
AV Area mean vel: 1.8 cm2
AV Mean grad: 5 mmHg
AV Peak grad: 13.1 mmHg
Ao pk vel: 1.81 m/s
Area-P 1/2: 3.53 cm2
Height: 63 in
S' Lateral: 3.4 cm
Weight: 2816 oz

## 2020-02-29 LAB — T3, FREE: T3, Free: 2.4 pg/mL (ref 2.0–4.4)

## 2020-02-29 NOTE — Progress Notes (Signed)
NURSING HOME LOCATION:  [facility] ROOM NUMBER:  151 P  CODE STATUS:  DNR  PCP:  Hurshel Party MD  This is a comprehensive admission note to [facility] performed on this date less than 30 days from date of admission. Included are preadmission medical/surgical history; reconciled medication list; family history; social history and comprehensive review of systems.  Corrections and additions to the records were documented. Comprehensive physical exam was also performed. Additionally a clinical summary was entered for each active diagnosis pertinent to this admission in the Problem List to enhance continuity of care.  HPI: Patient was hospitalized 11/23-11/30/2021, admitted with acute hypoxic respiratory failure in the context of bilateral pulmonary edema and inflammatory bronchitis on CT.  Patient received Zithromax and Rocephin, parenteral steroids, and aggressive pulmonary toilet.  Subsequently the parenteral steroids were transitioned to oral prednisone. Pulmonary edema was in the context of grade 1 diastolic dysfunction and history of CKD stage IV.  Creatinine peaked @ 3.35; baseline was felt to be 2.8.  IV Lasix did not result in significant diuretic response. Steroid administration resulted in hyperglycemia;but diabetic control was certainly adequate as documented by an A1c of 7%.  She was on as needed insulin at an assisted living prior to admission.  Past medical and surgical history: Includes hypothyroidism, history of subdural hematoma, protein caloric malnutrition, essential hypertension, history of hypercalcemia, history of stroke complicated by dysphagia, diabetes with neurovascular complications, vascular dementia, and CAD.  Social history: Nondrinker; never smoked.  Family history: Noncontributory due to advanced age.   Review of systems attempt was impacted by dementia.  She knew Christmas was coming but could not give me the year, month, or the name of any president.   She wanted to know if her daughter had come today.  She denied any active symptoms but did validate that she does get "short of breath once in a while".  She is on nasal oxygen constantly. Cardiovascular: No chest pain, palpitations, paroxysmal nocturnal dyspnea Respiratory: No cough, sputum production, hemoptysis Gastrointestinal: No heartburn abdominal pain, nausea /vomiting, rectal bleeding, melena Genitourinary: No dysuria, hematuria, pyuria Musculoskeletal: No joint stiffness, joint swelling, weakness, pain Dermatologic: No rash, pruritus Neurologic: No dizziness, headache, numbness, tingling Hematologic/lymphatic: No abnormal bleeding  Physical exam:  Pertinent or positive findings: She appears her stated age.  She is in the wheelchair wearing nasal oxygen as noted.  She has profound lordosis of the thoracic spine.  Facies are markedly weathered.  She is hard of hearing.  Heart rate is slow and rhythm slightly irregular.  Breath sounds are decreased, but she does exhibit inspiratory pops and wheezes.  Pedal pulses are decreased.  She has diffuse bruising over the upper extremities, greater on the right.  The right hand appears slightly swollen.  She is weak to opposition seemingly more so in the lower extremities.  General appearance:no acute distress, increased work of breathing is present.   Lymphatic: No lymphadenopathy about the head, neck, axilla. Eyes: No conjunctival inflammation or lid edema is present. There is no scleral icterus. Ears:  External ear exam shows no significant lesions or deformities.   Nose:  External nasal examination shows no deformity or inflammation. Nasal mucosa are pink and moist without lesions, exudates Oral exam: Lips and gums are healthy appearing.There is no oropharyngeal erythema or exudate. Neck:  No thyromegaly, masses, tenderness noted.    Heart:  No gallop, murmur, click, rub.  Lungs:  without  rhonchi, rubs. Abdomen: Bowel sounds are normal.  Abdomen is soft and nontender with no organomegaly, hernias, masses. GU: Deferred  Extremities:  No cyanosis, clubbing, edema. Neurologic exam:Balance, Rhomberg, finger to nose testing could not be completed due to clinical state Skin: Warm & dry w/o tenting. No significant  rash.  See clinical summary under each active problem in the Problem List with associated updated therapeutic plan

## 2020-02-29 NOTE — Assessment & Plan Note (Addendum)
Creatinine peaked at 3.35; presently at baseline at 2.8.  Poor response to IV Lasix prompted discontinuation of the diuretics

## 2020-02-29 NOTE — Assessment & Plan Note (Signed)
Despite parenteral steroids; diabetes is adequately controlled as manifested by an A1c of 7%.  Most important is to avoid hypoglycemia which would mimic TIAs in this elderly patient with multiple comorbidities. CKD basically back to baseline.

## 2020-02-29 NOTE — Patient Instructions (Signed)
See assessment and plan under each diagnosis in the problem list and acutely for this visit 

## 2020-02-29 NOTE — Assessment & Plan Note (Addendum)
TSH is therapeutic; no change in porcine thyroid supplement dose indicated.

## 2020-03-01 NOTE — Assessment & Plan Note (Signed)
Clinically compensated @ present, but prognosis poor due to age & advanced co-morbidities

## 2020-03-03 ENCOUNTER — Non-Acute Institutional Stay (SKILLED_NURSING_FACILITY): Payer: Medicare Other | Admitting: Adult Health

## 2020-03-03 ENCOUNTER — Encounter: Payer: Self-pay | Admitting: Adult Health

## 2020-03-03 DIAGNOSIS — J849 Interstitial pulmonary disease, unspecified: Secondary | ICD-10-CM

## 2020-03-03 NOTE — Progress Notes (Signed)
Location:  [facility] Room Number: 151/P Place of Service:  SNF (31)   CODE STATUS: DNR  Allergies  Allergen Reactions  . Cymbalta [Duloxetine Hcl] Other (See Comments)    unknown  . Effexor [Venlafaxine] Other (See Comments)    unknown  . Morphine And Related Other (See Comments)    Passed out   . Tape     adhesive    Chief Complaint  Patient presents with  . Acute Visit    Family Concerns     HPI:  Her family has concerns about her respiratory status. She is now 02 dependent. Her chest x-ray results show interstitial lung disease. She does have shortness of breath has an occasional nonproductive cough. There are no reports of fevers.   Past Medical History:  Diagnosis Date  . Anemia   . Arthritis   . Cerebral infarction ([place])   . Coronary artery disease   . Dementia ([place])   . Diabetes mellitus without complication (HCC)    Type 2  . Dysphagia due to recent cerebral infarction   . Failure to thrive (0-17)   . Hypercalcemia   . Hypertension   . Pneumonitis   . Protein calorie malnutrition ([place])   . Subdural hematoma ([place])   . Thyroid disease     History reviewed. No pertinent surgical history.  Social History   Socioeconomic History  . Marital status: Married    Spouse name: Not on file  . Number of children: Not on file  . Years of education: Not on file  . Highest education level: Not on file  Occupational History  . Not on file  Tobacco Use  . Smoking status: Never Smoker  . Smokeless tobacco: Never Used  Vaping Use  . Vaping Use: Never used  Substance and Sexual Activity  . Alcohol use: No  . Drug use: No  . Sexual activity: Not Currently  Other Topics Concern  . Not on file  Social History Narrative   Widow,husband passed away 2020,married for 57 years.Retired.   Social Determinants of Health   Financial Resource Strain: Not on file  Food Insecurity: Not on file  Transportation Needs: Not on file  Physical  Activity: Not on file  Stress: Not on file  Social Connections: Not on file  Intimate Partner Violence: Not on file   Family History  Family history unknown: Yes      VITAL SIGNS BP 132/74   Pulse 70   Temp 98.2 F (36.8 C)   Resp 20   Ht 5\' 3"  (1.6 m)   Wt 159 lb 11.2 oz (72.4 kg)   SpO2 93%   BMI 28.29 kg/m   Outpatient Encounter Medications as of 03/03/2020  Medication Sig  . acetaminophen (TYLENOL) 325 MG tablet Take 650 mg by mouth every 6 (six) hours as needed.  . Amino Acids-Protein Hydrolys (FEEDING SUPPLEMENT, PRO-STAT SUGAR FREE 64,) LIQD Take 30 mLs by mouth in the morning and at bedtime.  Marland Kitchen amLODipine (NORVASC) 10 MG tablet Take 10 mg by mouth daily.   Marland Kitchen aspirin EC 81 MG tablet Take 81 mg by mouth daily.  Marland Kitchen atorvastatin (LIPITOR) 20 MG tablet Take 20 mg by mouth daily.   . cetirizine (ZYRTEC) 10 MG tablet Take 10 mg by mouth daily as needed for allergies.  . Cholecalciferol 125 MCG (5000 UT) capsule Take 5,000 Units by mouth daily.  . famotidine (PEPCID) 20 MG tablet Take 20 mg by mouth daily.  Marland Kitchen  fluticasone (FLONASE) 50 MCG/ACT nasal spray Place 2 sprays into both nostrils daily.  . Fluticasone-Salmeterol (ADVAIR DISKUS) 100-50 MCG/DOSE AEPB Inhale 1 puff into the lungs 2 (two) times daily.  . hydrALAZINE (APRESOLINE) 25 MG tablet Take 25 mg by mouth 3 (three) times daily.  Marland Kitchen ipratropium-albuterol (DUONEB) 0.5-2.5 (3) MG/3ML SOLN Take 3 mLs by nebulization every 6 (six) hours as needed.  . magnesium oxide (MAG-OX) 400 MG tablet Take 400 mg by mouth daily.   . Multiple Vitamin (MULTIVITAMIN WITH MINERALS) TABS tablet Take 1 tablet by mouth daily.   . NON FORMULARY Diet: _____ Regular, ___x___ NAS, _______Consistent Carbohydrate, _______NPO _____Other  . NP THYROID 90 MG tablet Take 1 tablet (90 mg total) by mouth daily.  . OXYGEN Inhale 3 L into the lungs continuous.  Marland Kitchen rOPINIRole (REQUIP) 0.5 MG tablet Take 0.5 mg by mouth at bedtime.  . tamsulosin (FLOMAX)  0.4 MG CAPS capsule Take 0.4 mg by mouth every evening.  . traZODone (DESYREL) 50 MG tablet Take 50 mg by mouth at bedtime.  Marland Kitchen trimethoprim (TRIMPEX) 100 MG tablet Take 100 mg by mouth daily. For UTI  . Wheat Dextrin (BENEFIBER PO) Take 1 packet by mouth daily.   No facility-administered encounter medications on file as of 03/03/2020.     SIGNIFICANT DIAGNOSTIC EXAMS  PREVIOUS   11-14-16:  Ct of head:  1. No acute intracranial abnormality identified. 2. 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 3. Moderate for age chronic microvascular ischemic changes and parenchymal volume loss of the brain. 4. Bilateral mastoid effusions. 5. 12 mm nodule left suboccipital scalp, direct visualization recommended, probably sebaceous cyst.  02-05-20: chest x-ray:  1. Chronic interstitial lung disease with mild left basilar atelectasis and/or infiltrate. 2. Small left pleural effusion.  02-06-20: ct of chest:  1. Small bilateral pleural effusions and associated atelectasis or consolidation. 2. Diffuse interlobular septal thickening, consistent with pulmonary edema. 3. Diffuse bilateral bronchial wall thickening, nonspecific and may reflect infectious or inflammatory bronchitis or edema. 4. Bandlike scarring and or atelectasis of the left lower lobe and right middle lobe, most likely sequelae of prior infection. 5. No specific findings to suggest fibrotic interstitial lung disease, although assessment is limited given the presence of edema and effusions. 6. Cardiomegaly and coronary artery disease. 7. Enlargement of the main pulmonary artery, as can be seen in pulmonary hypertension. 8. Enlarged mediastinal lymph nodes, nonspecific and likely reactive.  9. Aortic Atherosclerosis   02-08-20: renal ultrasound:  1. No acute abnormality. No hydronephrosis or bladder distention. 2. Bilateral simple renal cysts. Bilateral renal  cortical thinning. Right kidney is slightly atrophic.  NO NEW EXAMS.   LABS REVIEWED PREVIOUS   02-09-20: wbc 14.3; hgb 8.5; hct 27.5; mcv 102.2 plt 207;  02-10-20: hgb a1c 7.0 02-12-20: glucose 241; bun 88; creat 2.71; k+ 5.0; na++ 138; ca 8.8 phos 5.1 albumin 2.7  02-25-20: wbc 14.3; hgb 8.5; hct 27.5; mcv 102.2 plt 207; glucose 102; bun 44; creat 2.70; k+ 6.4; na++ 140 ca 8.6 liver normal albumin 2.8 tsh 7.031vit D 77.07 02-26-20: glucose 99; bun 37; creat 2.54; k+ 5.8; na++ 141; liver normal albumin 2.7 02-28-20: glucose 120; bun 39; creat 2.66; k+ 6.9; na++ 140; ca 8.6   NO NEW LABS.    Review of Systems  Constitutional: Negative for malaise/fatigue.  Respiratory: Positive for cough and shortness of breath.   Cardiovascular: Negative for chest pain.  Gastrointestinal: Negative for  abdominal pain.  Musculoskeletal: Negative for back pain, joint pain and myalgias.  Skin: Negative.   Psychiatric/Behavioral: The patient is not nervous/anxious.     Physical Exam Constitutional:      General: She is not in acute distress.    Appearance: She is well-developed and well-nourished. She is not diaphoretic.  Neck:     Thyroid: No thyromegaly.  Cardiovascular:     Rate and Rhythm: Normal rate and regular rhythm.     Pulses: Normal pulses and intact distal pulses.     Heart sounds: Murmur heard.    Pulmonary:     Effort: Pulmonary effort is normal. No respiratory distress.     Breath sounds: Normal breath sounds.     Comments: 02 Abdominal:     General: Bowel sounds are normal. There is no distension.     Palpations: Abdomen is soft.     Tenderness: There is no abdominal tenderness.  Musculoskeletal:        General: No edema.     Cervical back: Neck supple.     Right lower leg: No edema.     Left lower leg: No edema.     Comments: Able to move extremities Kyphosis    Lymphadenopathy:     Cervical: No cervical adenopathy.  Skin:    General: Skin is warm and dry.   Neurological:     Mental Status: She is alert. Mental status is at baseline.  Psychiatric:        Mood and Affect: Mood and affect and mood normal.        ASSESSMENT/ PLAN:  TODAY  1. Chronic interstitial lung disease:   Is without change:  Will begin advair 100/50 1 puff twice daily  Will monitor her status.   MD is aware of resident's narcotic use and is in agreement with current plan of care. We will attempt to wean resident as appropriate.  Ok Edwards NP [facility]  Contact [phone] Monday through Friday 8am- 5pm  After hours call [phone]

## 2020-03-04 ENCOUNTER — Encounter: Payer: Self-pay | Admitting: Adult Health

## 2020-03-04 ENCOUNTER — Other Ambulatory Visit: Payer: Self-pay | Admitting: Adult Health

## 2020-03-04 ENCOUNTER — Non-Acute Institutional Stay (SKILLED_NURSING_FACILITY): Payer: Medicare Other | Admitting: Adult Health

## 2020-03-04 ENCOUNTER — Other Ambulatory Visit (HOSPITAL_COMMUNITY)
Admission: RE | Admit: 2020-03-04 | Discharge: 2020-03-04 | Disposition: A | Discharge status: Home / Self Care | Payer: Medicare Other | Source: Skilled Nursing Facility | Attending: Adult Health | Admitting: Adult Health

## 2020-03-04 DIAGNOSIS — E1122 Type 2 diabetes mellitus with diabetic chronic kidney disease: Secondary | ICD-10-CM

## 2020-03-04 DIAGNOSIS — R627 Adult failure to thrive: Secondary | ICD-10-CM

## 2020-03-04 DIAGNOSIS — Z66 Do not resuscitate: Secondary | ICD-10-CM

## 2020-03-04 DIAGNOSIS — J849 Interstitial pulmonary disease, unspecified: Secondary | ICD-10-CM

## 2020-03-04 DIAGNOSIS — E876 Hypokalemia: Secondary | ICD-10-CM | POA: Insufficient documentation

## 2020-03-04 DIAGNOSIS — I501 Left ventricular failure: Secondary | ICD-10-CM

## 2020-03-04 DIAGNOSIS — N184 Chronic kidney disease, stage 4 (severe): Secondary | ICD-10-CM

## 2020-03-04 LAB — CBC WITH DIFFERENTIAL/PLATELET
Abs Immature Granulocytes: 0.07 10*3/uL (ref 0.00–0.07)
Basophils Absolute: 0 10*3/uL (ref 0.0–0.1)
Basophils Relative: 0 %
Eosinophils Absolute: 0.2 10*3/uL (ref 0.0–0.5)
Eosinophils Relative: 3 %
HCT: 26.4 % — ABNORMAL LOW (ref 36.0–46.0)
Hemoglobin: 7.7 g/dL — ABNORMAL LOW (ref 12.0–15.0)
Immature Granulocytes: 1 %
Lymphocytes Relative: 16 %
Lymphs Abs: 1 10*3/uL (ref 0.7–4.0)
MCH: 31.3 pg (ref 26.0–34.0)
MCHC: 29.2 g/dL — ABNORMAL LOW (ref 30.0–36.0)
MCV: 107.3 fL — ABNORMAL HIGH (ref 80.0–100.0)
Monocytes Absolute: 0.5 10*3/uL (ref 0.1–1.0)
Monocytes Relative: 7 %
Neutro Abs: 4.6 10*3/uL (ref 1.7–7.7)
Neutrophils Relative %: 73 %
Platelets: 225 10*3/uL (ref 150–400)
RBC: 2.46 MIL/uL — ABNORMAL LOW (ref 3.87–5.11)
RDW: 15.5 % (ref 11.5–15.5)
WBC: 6.4 10*3/uL (ref 4.0–10.5)
nRBC: 0 % (ref 0.0–0.2)

## 2020-03-04 LAB — BASIC METABOLIC PANEL
Anion gap: 6 (ref 5–15)
BUN: 30 mg/dL — ABNORMAL HIGH (ref 8–23)
CO2: 26 mmol/L (ref 22–32)
Calcium: 8.8 mg/dL — ABNORMAL LOW (ref 8.9–10.3)
Chloride: 112 mmol/L — ABNORMAL HIGH (ref 98–111)
Creatinine, Ser: 3.01 mg/dL — ABNORMAL HIGH (ref 0.44–1.00)
GFR, Estimated: 14 mL/min — ABNORMAL LOW (ref >60)
Glucose, Bld: 76 mg/dL (ref 70–99)
Potassium: 5.4 mmol/L — ABNORMAL HIGH (ref 3.5–5.1)
Sodium: 144 mmol/L (ref 135–145)

## 2020-03-04 MED ORDER — LORAZEPAM 2 MG/ML PO CONC: 0.6000 mg | Freq: Four times a day (QID) | ORAL | 0 refills | Status: DC | PRN

## 2020-03-04 MED ORDER — OXYCODONE HCL 10 MG/0.5ML PO CONC: 5.0000 mg | Freq: Four times a day (QID) | ORAL | 0 refills | Status: DC | PRN

## 2020-03-04 NOTE — Progress Notes (Signed)
Location:  [place] Room Number: 151-P Place of Service:  SNF (31)   CODE STATUS: DNR  Allergies  Allergen Reactions  . Cymbalta [Duloxetine Hcl] Other (See Comments)    unknown  . Effexor [Venlafaxine] Other (See Comments)    unknown  . Morphine And Related Other (See Comments)    Passed out   . Tape     adhesive    Chief Complaint  Patient presents with  . Acute Visit    Lab follow-up     HPI:  Her renal function continues to worsening with a creat of 3.01. I have spoken with her son in law regarding her status. She has increased respiration rate with axillary muscle use. Her breath sounds are diminished bilaterally. She has a very poor po intake. I have had the pharmacy review her medications for renal offenders; will stop the prn tylenol; there were no other offenders present. Her son in law would like to try po lasix to help with the pulmonary edema and effusions. He is also interested in a hospice consult. There are no reports of fevers present.   Past Medical History:  Diagnosis Date  . Anemia   . Arthritis   . Cerebral infarction ([place])   . Coronary artery disease   . Dementia ([place])   . Diabetes mellitus without complication (HCC)    Type 2  . Dysphagia due to recent cerebral infarction   . Failure to thrive (0-17)   . Hypercalcemia   . Hypertension   . Pneumonitis   . Protein calorie malnutrition ([place])   . Subdural hematoma ([place])   . Thyroid disease     History reviewed. No pertinent surgical history.  Social History   Socioeconomic History  . Marital status: Married    Spouse name: Not on file  . Number of children: Not on file  . Years of education: Not on file  . Highest education level: Not on file  Occupational History  . Not on file  Tobacco Use  . Smoking status: Never Smoker  . Smokeless tobacco: Never Used  Vaping Use  . Vaping Use: Never used  Substance and Sexual Activity  . Alcohol use: No  . Drug use: No   . Sexual activity: Not Currently  Other Topics Concern  . Not on file  Social History Narrative   Widow,husband passed away 2020,married for 57 years.Retired.   Social Determinants of Health   Financial Resource Strain: Not on file  Food Insecurity: Not on file  Transportation Needs: Not on file  Physical Activity: Not on file  Stress: Not on file  Social Connections: Not on file  Intimate Partner Violence: Not on file   Family History  Family history unknown: Yes      VITAL SIGNS BP (!) 144/41   Pulse (!) 58   Temp 97.8 F (36.6 C)   Resp 18   Ht 5\' 3"  (1.6 m)   Wt 159 lb 11.2 oz (72.4 kg)   SpO2 (!) 89%   BMI 28.29 kg/m   Outpatient Encounter Medications as of 03/04/2020  Medication Sig  . acetaminophen (TYLENOL) 325 MG tablet Take 650 mg by mouth every 6 (six) hours as needed.  . Amino Acids-Protein Hydrolys (FEEDING SUPPLEMENT, PRO-STAT SUGAR FREE 64,) LIQD Take 30 mLs by mouth in the morning and at bedtime.  Marland Kitchen amLODipine (NORVASC) 10 MG tablet Take 10 mg by mouth daily.   Marland Kitchen aspirin EC 81 MG tablet Take 81  mg by mouth daily.  Marland Kitchen atorvastatin (LIPITOR) 20 MG tablet Take 20 mg by mouth daily.   . cetirizine (ZYRTEC) 10 MG tablet Take 10 mg by mouth daily as needed for allergies.  . Cholecalciferol 125 MCG (5000 UT) capsule Take 5,000 Units by mouth daily.  . famotidine (PEPCID) 20 MG tablet Take 20 mg by mouth daily.  . fluticasone (FLONASE) 50 MCG/ACT nasal spray Place 2 sprays into both nostrils daily.  . Fluticasone-Salmeterol (ADVAIR) 100-50 MCG/DOSE AEPB Inhale 1 puff into the lungs 2 (two) times daily.  . hydrALAZINE (APRESOLINE) 25 MG tablet Take 25 mg by mouth 3 (three) times daily.  Marland Kitchen ipratropium-albuterol (DUONEB) 0.5-2.5 (3) MG/3ML SOLN Take 3 mLs by nebulization every 6 (six) hours as needed.  . magnesium oxide (MAG-OX) 400 MG tablet Take 400 mg by mouth daily.   . Multiple Vitamin (MULTIVITAMIN WITH MINERALS) TABS tablet Take 1 tablet by mouth daily.    . NON FORMULARY Diet: _____ Regular, ___x___ NAS, _______Consistent Carbohydrate, _______NPO _____Other  . NP THYROID 90 MG tablet Take 1 tablet (90 mg total) by mouth daily.  . OXYGEN Inhale 3 L into the lungs continuous.  Marland Kitchen rOPINIRole (REQUIP) 0.5 MG tablet Take 0.5 mg by mouth at bedtime.  . tamsulosin (FLOMAX) 0.4 MG CAPS capsule Take 0.4 mg by mouth every evening.  . traZODone (DESYREL) 50 MG tablet Take 50 mg by mouth at bedtime.  Marland Kitchen trimethoprim (TRIMPEX) 100 MG tablet Take 100 mg by mouth daily. For UTI  . Wheat Dextrin (BENEFIBER PO) Take 1 packet by mouth daily.   No facility-administered encounter medications on file as of 03/04/2020.     SIGNIFICANT DIAGNOSTIC EXAMS   PREVIOUS   11-14-16:  Ct of head:  1. No acute intracranial abnormality identified. 2. 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 3. Moderate for age chronic microvascular ischemic changes and parenchymal volume loss of the brain. 4. Bilateral mastoid effusions. 5. 12 mm nodule left suboccipital scalp, direct visualization recommended, probably sebaceous cyst.  02-05-20: chest x-ray:  1. Chronic interstitial lung disease with mild left basilar atelectasis and/or infiltrate. 2. Small left pleural effusion.  02-06-20: ct of chest:  1. Small bilateral pleural effusions and associated atelectasis or consolidation. 2. Diffuse interlobular septal thickening, consistent with pulmonary edema. 3. Diffuse bilateral bronchial wall thickening, nonspecific and may reflect infectious or inflammatory bronchitis or edema. 4. Bandlike scarring and or atelectasis of the left lower lobe and right middle lobe, most likely sequelae of prior infection. 5. No specific findings to suggest fibrotic interstitial lung disease, although assessment is limited given the presence of edema and effusions. 6. Cardiomegaly and coronary artery disease. 7.  Enlargement of the main pulmonary artery, as can be seen in pulmonary hypertension. 8. Enlarged mediastinal lymph nodes, nonspecific and likely reactive.  9. Aortic Atherosclerosis   02-08-20: renal ultrasound:  1. No acute abnormality. No hydronephrosis or bladder distention. 2. Bilateral simple renal cysts. Bilateral renal cortical thinning. Right kidney is slightly atrophic.  TODAY  03-04-20: chest x-ray (Dr Darrol Angel report);  Bilateral pleural effusions R>L Increased interstitial markings bilateral upper lobes.  Bilateral pulmonary edema Significant osteoporosis Cardiomegaly   LABS REVIEWED PREVIOUS   02-09-20: wbc 14.3; hgb 8.5; hct 27.5; mcv 102.2 plt 207;  02-10-20: hgb a1c 7.0 02-12-20: glucose 241; bun 88; creat 2.71; k+ 5.0; na++ 138; ca 8.8 phos 5.1 albumin 2.7  02-25-20: wbc 14.3; hgb 8.5; hct 27.5; mcv  102.2 plt 207; glucose 102; bun 44; creat 2.70; k+ 6.4; na++ 140 ca 8.6 liver normal albumin 2.8 tsh 7.031vit D 77.07 02-26-20: glucose 99; bun 37; creat 2.54; k+ 5.8; na++ 141; liver normal albumin 2.7 02-28-20: glucose 120; bun 39; creat 2.66; k+ 6.9; na++ 140; ca 8.6   TODAY  03-04-20: wbc 6.4; hgb 7.7; hct 26.4; mcv 107.3 plt 225; glucose 76; bun 30; creat 3.01; k+ 5.4; na++ 144; ca 8.8   Review of Systems  Unable to perform ROS: Medical condition    Physical Exam Constitutional:      General: She is not in acute distress.    Appearance: She is well-developed and well-nourished. She is not diaphoretic.  Neck:     Thyroid: No thyromegaly.  Cardiovascular:     Rate and Rhythm: Normal rate and regular rhythm.     Pulses: Intact distal pulses.     Heart sounds: Murmur heard.    Pulmonary:     Effort: No respiratory distress.     Comments: 02 dependent Increased rate increased effort with axillary muscle use.  Diminished sounds bilaterally  Abdominal:     General: Bowel sounds are normal. There is no distension.     Palpations: Abdomen is soft.      Tenderness: There is no abdominal tenderness.  Musculoskeletal:        General: No edema.     Right lower leg: No edema.     Left lower leg: No edema.     Comments:  Able to move extremities Kyphosis     Lymphadenopathy:     Cervical: No cervical adenopathy.  Skin:    General: Skin is warm and dry.  Neurological:     Mental Status: She is alert.  Psychiatric:        Mood and Affect: Mood and affect and mood normal.       ASSESSMENT/ PLAN:  TODAY  1. Pulmonary edema with chronic congestive heart failure  2. Failure to thrive in adult 3. CKD stage 4 due to type 2 diabetes mellitus 4. Chronic interstitial lung disease   Will give lasix 40 mg today Will begin oxyfast 5 mg every 6 hours as needed Will begin ativan solution 0.5 mg every 6 hours as needed Will stop: asa; lipitor; vit D; MVI; hydralazine  Will setup hospice consult.     MD is aware of resident's narcotic use and is in agreement with current plan of care. We will attempt to wean resident as appropriate.  Ok Edwards NP [facility]  Contact [phone] Monday through Friday 8am- 5pm  After hours call [phone]

## 2020-03-05 ENCOUNTER — Other Ambulatory Visit (HOSPITAL_COMMUNITY)
Admission: RE | Admit: 2020-03-05 | Discharge: 2020-03-05 | Disposition: A | Discharge status: Home / Self Care | Payer: Medicare Other | Source: Skilled Nursing Facility | Attending: Internal Medicine | Admitting: Internal Medicine

## 2020-03-05 ENCOUNTER — Encounter: Payer: Self-pay | Admitting: Adult Health

## 2020-03-05 ENCOUNTER — Non-Acute Institutional Stay (SKILLED_NURSING_FACILITY): Payer: Medicare Other | Admitting: Adult Health

## 2020-03-05 DIAGNOSIS — R627 Adult failure to thrive: Secondary | ICD-10-CM | POA: Diagnosis not present

## 2020-03-05 DIAGNOSIS — N184 Chronic kidney disease, stage 4 (severe): Secondary | ICD-10-CM | POA: Diagnosis not present

## 2020-03-05 DIAGNOSIS — R1310 Dysphagia, unspecified: Secondary | ICD-10-CM

## 2020-03-05 DIAGNOSIS — J9601 Acute respiratory failure with hypoxia: Secondary | ICD-10-CM | POA: Insufficient documentation

## 2020-03-05 DIAGNOSIS — E1122 Type 2 diabetes mellitus with diabetic chronic kidney disease: Secondary | ICD-10-CM | POA: Diagnosis not present

## 2020-03-05 LAB — BASIC METABOLIC PANEL
Anion gap: 6 (ref 5–15)
BUN: 30 mg/dL — ABNORMAL HIGH (ref 8–23)
CO2: 26 mmol/L (ref 22–32)
Calcium: 8.9 mg/dL (ref 8.9–10.3)
Chloride: 112 mmol/L — ABNORMAL HIGH (ref 98–111)
Creatinine, Ser: 3.24 mg/dL — ABNORMAL HIGH (ref 0.44–1.00)
GFR, Estimated: 13 mL/min — ABNORMAL LOW (ref >60)
Glucose, Bld: 72 mg/dL (ref 70–99)
Potassium: 5.4 mmol/L — ABNORMAL HIGH (ref 3.5–5.1)
Sodium: 144 mmol/L (ref 135–145)

## 2020-03-05 NOTE — Progress Notes (Signed)
Location:  [facility] Room Number: 151-P Place of Service:  SNF (31)   CODE STATUS: DNR  Allergies  Allergen Reactions   Cymbalta [Duloxetine Hcl] Other (See Comments)    unknown   Effexor [Venlafaxine] Other (See Comments)    unknown   Morphine And Related Other (See Comments)    Passed out    Tape     adhesive    Chief Complaint  Patient presents with   Acute Visit    Follow-up status     HPI:  Staff report that she has very poor po intake. She is coughing on thin liquids. Her family would like for her to continue the lasix daily. Her family has opted out of hospice care; wanting to continue therapy. She is in bed today and is unable to get out of bed. There are no reports of fevers present.   Past Medical History:  Diagnosis Date   Anemia    Arthritis    Cerebral infarction ([place])    Coronary artery disease    Dementia ([place])    Diabetes mellitus without complication ([place])    Type 2   Dysphagia due to recent cerebral infarction    Failure to thrive (0-17)    Hypercalcemia    Hypertension    Pneumonitis    Protein calorie malnutrition ([place])    Subdural hematoma (HCC)    Thyroid disease     History reviewed. No pertinent surgical history.  Social History   Socioeconomic History   Marital status: Married    Spouse name: Not on file   Number of children: Not on file   Years of education: Not on file   Highest education level: Not on file  Occupational History   Not on file  Tobacco Use   Smoking status: Never Smoker   Smokeless tobacco: Never Used  Vaping Use   Vaping Use: Never used  Substance and Sexual Activity   Alcohol use: No   Drug use: No   Sexual activity: Not Currently  Other Topics Concern   Not on file  Social History Narrative   Widow,husband passed away 2020,married for 57 years.Retired.   Social Determinants of Health   Financial Resource Strain: Not on file  Food  Insecurity: Not on file  Transportation Needs: Not on file  Physical Activity: Not on file  Stress: Not on file  Social Connections: Not on file  Intimate Partner Violence: Not on file   Family History  Family history unknown: Yes      VITAL SIGNS BP (!) 144/41    Pulse (!) 58    Temp 97.8 F (36.6 C)    Resp 18    Ht 5\' 3"  (1.6 m)    Wt 159 lb 11.2 oz (72.4 kg)    SpO2 96%    BMI 28.29 kg/m   Outpatient Encounter Medications as of 03/05/2020  Medication Sig   Amino Acids-Protein Hydrolys (FEEDING SUPPLEMENT, PRO-STAT SUGAR FREE 64,) LIQD Take 30 mLs by mouth in the morning and at bedtime.   amLODipine (NORVASC) 10 MG tablet Take 10 mg by mouth daily.    cetirizine (ZYRTEC) 10 MG tablet Take 10 mg by mouth daily as needed for allergies.   famotidine (PEPCID) 20 MG tablet Take 20 mg by mouth daily.   fluticasone (FLONASE) 50 MCG/ACT nasal spray Place 2 sprays into both nostrils daily.   Fluticasone-Salmeterol (ADVAIR) 100-50 MCG/DOSE AEPB Inhale 1 puff into the lungs 2 (two) times daily.  furosemide (LASIX) 40 MG tablet Take 40 mg by mouth daily.   ipratropium-albuterol (DUONEB) 0.5-2.5 (3) MG/3ML SOLN Take 3 mLs by nebulization every 6 (six) hours as needed.   LORazepam (ATIVAN) 2 MG/ML concentrated solution Take 0.5 mg by mouth every 6 (six) hours as needed for anxiety (or agitation).   magnesium oxide (MAG-OX) 400 MG tablet Take 400 mg by mouth daily.    NON FORMULARY Diet: _____ Regular, ___x___ NAS, _______Consistent Carbohydrate, _______NPO _____Other   NP THYROID 90 MG tablet Take 1 tablet (90 mg total) by mouth daily.   oxyCODONE HCl 10 MG/0.5ML CONC Take 5 mg by mouth every 6 (six) hours as needed.   OXYGEN Inhale 3 L into the lungs continuous.   rOPINIRole (REQUIP) 0.5 MG tablet Take 0.5 mg by mouth at bedtime.   tamsulosin (FLOMAX) 0.4 MG CAPS capsule Take 0.4 mg by mouth every evening.   traZODone (DESYREL) 50 MG tablet Take 50 mg by mouth at  bedtime.   trimethoprim (TRIMPEX) 100 MG tablet Take 100 mg by mouth daily. For UTI   Wheat Dextrin (BENEFIBER PO) Take 1 packet by mouth daily.   [DISCONTINUED] acetaminophen (TYLENOL) 325 MG tablet Take 650 mg by mouth every 6 (six) hours as needed.   [DISCONTINUED] aspirin EC 81 MG tablet Take 81 mg by mouth daily.   [DISCONTINUED] atorvastatin (LIPITOR) 20 MG tablet Take 20 mg by mouth daily.    [DISCONTINUED] Cholecalciferol 125 MCG (5000 UT) capsule Take 5,000 Units by mouth daily.   [DISCONTINUED] hydrALAZINE (APRESOLINE) 25 MG tablet Take 25 mg by mouth 3 (three) times daily.   [DISCONTINUED] LORazepam (LORAZEPAM INTENSOL) 2 MG/ML concentrated solution Take 0.3 mLs (0.6 mg total) by mouth every 6 (six) hours as needed for anxiety (THE DOSE IS TO BE 0.5 MG OR 0.25 ML).   [DISCONTINUED] Multiple Vitamin (MULTIVITAMIN WITH MINERALS) TABS tablet Take 1 tablet by mouth daily.    No facility-administered encounter medications on file as of 03/05/2020.     SIGNIFICANT DIAGNOSTIC EXAMS   PREVIOUS   11-14-16:  Ct of head:  1. No acute intracranial abnormality identified. 2. 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 3. Moderate for age chronic microvascular ischemic changes and parenchymal volume loss of the brain. 4. Bilateral mastoid effusions. 5. 12 mm nodule left suboccipital scalp, direct visualization recommended, probably sebaceous cyst.  02-05-20: chest x-ray:  1. Chronic interstitial lung disease with mild left basilar atelectasis and/or infiltrate. 2. Small left pleural effusion.  02-06-20: ct of chest:  1. Small bilateral pleural effusions and associated atelectasis or consolidation. 2. Diffuse interlobular septal thickening, consistent with pulmonary edema. 3. Diffuse bilateral bronchial wall thickening, nonspecific and may reflect infectious or inflammatory bronchitis or edema. 4.  Bandlike scarring and or atelectasis of the left lower lobe and right middle lobe, most likely sequelae of prior infection. 5. No specific findings to suggest fibrotic interstitial lung disease, although assessment is limited given the presence of edema and effusions. 6. Cardiomegaly and coronary artery disease. 7. Enlargement of the main pulmonary artery, as can be seen in pulmonary hypertension. 8. Enlarged mediastinal lymph nodes, nonspecific and likely reactive.  9. Aortic Atherosclerosis   02-08-20: renal ultrasound:  1. No acute abnormality. No hydronephrosis or bladder distention. 2. Bilateral simple renal cysts. Bilateral renal cortical thinning. Right kidney is slightly atrophic.  03-04-20: chest x-ray (Dr Darrol Angel report);  Bilateral pleural effusions R>L Increased interstitial markings bilateral upper  lobes.  Bilateral pulmonary edema Significant osteoporosis Cardiomegaly   NO NEW EXAMS.   LABS REVIEWED PREVIOUS   02-09-20: wbc 14.3; hgb 8.5; hct 27.5; mcv 102.2 plt 207;  02-10-20: hgb a1c 7.0 02-12-20: glucose 241; bun 88; creat 2.71; k+ 5.0; na++ 138; ca 8.8 phos 5.1 albumin 2.7  02-25-20: wbc 14.3; hgb 8.5; hct 27.5; mcv 102.2 plt 207; glucose 102; bun 44; creat 2.70; k+ 6.4; na++ 140 ca 8.6 liver normal albumin 2.8 tsh 7.031vit D 77.07 02-26-20: glucose 99; bun 37; creat 2.54; k+ 5.8; na++ 141; liver normal albumin 2.7 02-28-20: glucose 120; bun 39; creat 2.66; k+ 6.9; na++ 140; ca 8.6   TODAY  03-04-20: wbc 6.4; hgb 7.7; hct 26.4; mcv 107.3 plt 225; glucose 76; bun 30; creat 3.01; k+ 5.4; na++ 144; ca 8.8  03-05-20: glucose 72; bun 30; creat 3.24; k+ 5.4; na++ 144; ca 8.9    Review of Systems  Unable to perform ROS: Medical condition    Physical Exam Constitutional:      General: She is not in acute distress.    Appearance: She is well-developed and well-nourished. She is not diaphoretic.  Neck:     Thyroid: No thyromegaly.  Cardiovascular:     Rate and  Rhythm: Normal rate and regular rhythm.     Pulses: Normal pulses and intact distal pulses.     Heart sounds: Murmur heard.    Pulmonary:     Effort: Pulmonary effort is normal. No respiratory distress.     Comments: 02 dependent Breath sounds diminished  Abdominal:     General: Bowel sounds are normal. There is no distension.     Palpations: Abdomen is soft.     Tenderness: There is no abdominal tenderness.  Musculoskeletal:        General: No edema.     Cervical back: Neck supple.     Right lower leg: No edema.     Left lower leg: No edema.     Comments: Able to move extremities Kyphosis      Lymphadenopathy:     Cervical: No cervical adenopathy.  Skin:    General: Skin is warm and dry.  Neurological:     Mental Status: She is alert. Mental status is at baseline.  Psychiatric:        Mood and Affect: Mood and affect and mood normal.      ASSESSMENT/ PLAN:  TODAY  1. Failure to thrive in adult 2. CKD stage 4 due to type 2 diabetes mellitus 3. Dysphagia unspecified type  Her status is worse Will begin nectar thick liquids Will continue lasix 40 mg daily Will repeat BMP  She will not be followed by hospice care at this time.   MD is aware of resident's narcotic use and is in agreement with current plan of care. We will attempt to wean resident as appropriate.  Ok Edwards NP [facility]  Contact [phone] Monday through Friday 8am- 5pm  After hours call [phone]

## 2020-03-06 ENCOUNTER — Non-Acute Institutional Stay (SKILLED_NURSING_FACILITY): Payer: Medicare Other | Admitting: Adult Health

## 2020-03-06 ENCOUNTER — Encounter: Payer: Self-pay | Admitting: Adult Health

## 2020-03-06 DIAGNOSIS — I2510 Atherosclerotic heart disease of native coronary artery without angina pectoris: Secondary | ICD-10-CM

## 2020-03-06 DIAGNOSIS — E785 Hyperlipidemia, unspecified: Secondary | ICD-10-CM | POA: Diagnosis not present

## 2020-03-06 DIAGNOSIS — I700 Atherosclerosis of aorta: Secondary | ICD-10-CM | POA: Diagnosis not present

## 2020-03-06 DIAGNOSIS — E1169 Type 2 diabetes mellitus with other specified complication: Secondary | ICD-10-CM | POA: Diagnosis not present

## 2020-03-06 NOTE — Progress Notes (Signed)
Location:  [place] Room Number: 151-P Place of Service:  SNF (31)   CODE STATUS: DNR  Allergies  Allergen Reactions  . Cymbalta [Duloxetine Hcl] Other (See Comments)    unknown  . Effexor [Venlafaxine] Other (See Comments)    unknown  . Morphine And Related Other (See Comments)    Passed out   . Tape     adhesive    Chief Complaint  Patient presents with  . Routine Visit            Hyperlipidemia associated with type 2 diabetes mellitus:   Aortic atherosclerosis:  Coronary artery disease involving native coronary artery of native heart without angina:   Weekly follow up for the first 30 days post hospitalization.      HPI:  She is a 86 year old long term resident of this facility being seen for the management of her chronic illnesses: Hyperlipidemia associated with type 2 diabetes mellitus:   Aortic atherosclerosis:  Coronary artery disease involving native coronary artery of native heart without angina. She is out of bed today sitting in her wheelchair. She states that she is weak and tired; she denies any cough; on shortness of breath.   Past Medical History:  Diagnosis Date  . Anemia   . Arthritis   . Cerebral infarction ([place])   . Coronary artery disease   . Dementia ([place])   . Diabetes mellitus without complication (HCC)    Type 2  . Dysphagia due to recent cerebral infarction   . Failure to thrive (0-17)   . Hypercalcemia   . Hypertension   . Pneumonitis   . Protein calorie malnutrition ([place])   . Subdural hematoma ([place])   . Thyroid disease     History reviewed. No pertinent surgical history.  Social History   Socioeconomic History  . Marital status: Married    Spouse name: Not on file  . Number of children: Not on file  . Years of education: Not on file  . Highest education level: Not on file  Occupational History  . Not on file  Tobacco Use  . Smoking status: Never Smoker  . Smokeless tobacco: Never Used  Vaping Use  .  Vaping Use: Never used  Substance and Sexual Activity  . Alcohol use: No  . Drug use: No  . Sexual activity: Not Currently  Other Topics Concern  . Not on file  Social History Narrative   Widow,husband passed away 2020,married for 57 years.Retired.   Social Determinants of Health   Financial Resource Strain: Not on file  Food Insecurity: Not on file  Transportation Needs: Not on file  Physical Activity: Not on file  Stress: Not on file  Social Connections: Not on file  Intimate Partner Violence: Not on file   Family History  Family history unknown: Yes      VITAL SIGNS BP (!) 144/41   Pulse (!) 58   Temp 97.8 F (36.6 C)   Resp 18   Ht 5\' 3"  (1.6 m)   Wt 159 lb 11.2 oz (72.4 kg)   SpO2 98%   BMI 28.29 kg/m   Outpatient Encounter Medications as of 03/06/2020  Medication Sig  . Amino Acids-Protein Hydrolys (FEEDING SUPPLEMENT, PRO-STAT SUGAR FREE 64,) LIQD Take 30 mLs by mouth in the morning and at bedtime.  Marland Kitchen amLODipine (NORVASC) 10 MG tablet Take 10 mg by mouth daily.   . cetirizine (ZYRTEC) 10 MG tablet Take 10 mg by mouth daily  as needed for allergies.  . famotidine (PEPCID) 20 MG tablet Take 20 mg by mouth daily.  . fluticasone (FLONASE) 50 MCG/ACT nasal spray Place 2 sprays into both nostrils daily.  . Fluticasone-Salmeterol (ADVAIR) 100-50 MCG/DOSE AEPB Inhale 1 puff into the lungs 2 (two) times daily.  . furosemide (LASIX) 40 MG tablet Take 40 mg by mouth daily.  Marland Kitchen ipratropium-albuterol (DUONEB) 0.5-2.5 (3) MG/3ML SOLN Take 3 mLs by nebulization every 6 (six) hours as needed.  Marland Kitchen LORazepam (ATIVAN) 2 MG/ML concentrated solution Take 0.5 mg by mouth every 6 (six) hours as needed for anxiety (or agitation).  . magnesium oxide (MAG-OX) 400 MG tablet Take 400 mg by mouth daily.   . NON FORMULARY Diet: _____ Regular, ___x___ NAS, _______Consistent Carbohydrate, _______NPO _____Other  . NP THYROID 90 MG tablet Take 1 tablet (90 mg total) by mouth daily.  Marland Kitchen  oxyCODONE HCl 10 MG/0.5ML CONC Take 5 mg by mouth every 6 (six) hours as needed.  . OXYGEN Inhale 3 L into the lungs continuous.  Marland Kitchen rOPINIRole (REQUIP) 0.5 MG tablet Take 0.5 mg by mouth at bedtime.  . tamsulosin (FLOMAX) 0.4 MG CAPS capsule Take 0.4 mg by mouth every evening.  . traZODone (DESYREL) 50 MG tablet Take 50 mg by mouth at bedtime.  Marland Kitchen trimethoprim (TRIMPEX) 100 MG tablet Take 100 mg by mouth daily. For UTI  . Wheat Dextrin (BENEFIBER PO) Take 1 packet by mouth daily.   No facility-administered encounter medications on file as of 03/06/2020.     SIGNIFICANT DIAGNOSTIC EXAMS   PREVIOUS   11-14-16:  Ct of head:  1. No acute intracranial abnormality identified. 2. 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 3. Moderate for age chronic microvascular ischemic changes and parenchymal volume loss of the brain. 4. Bilateral mastoid effusions. 5. 12 mm nodule left suboccipital scalp, direct visualization recommended, probably sebaceous cyst.  02-05-20: chest x-ray:  1. Chronic interstitial lung disease with mild left basilar atelectasis and/or infiltrate. 2. Small left pleural effusion.  02-06-20: ct of chest:  1. Small bilateral pleural effusions and associated atelectasis or consolidation. 2. Diffuse interlobular septal thickening, consistent with pulmonary edema. 3. Diffuse bilateral bronchial wall thickening, nonspecific and may reflect infectious or inflammatory bronchitis or edema. 4. Bandlike scarring and or atelectasis of the left lower lobe and right middle lobe, most likely sequelae of prior infection. 5. No specific findings to suggest fibrotic interstitial lung disease, although assessment is limited given the presence of edema and effusions. 6. Cardiomegaly and coronary artery disease. 7. Enlargement of the main pulmonary artery, as can be seen in pulmonary hypertension. 8. Enlarged  mediastinal lymph nodes, nonspecific and likely reactive.  9. Aortic Atherosclerosis   02-08-20: renal ultrasound:  1. No acute abnormality. No hydronephrosis or bladder distention. 2. Bilateral simple renal cysts. Bilateral renal cortical thinning. Right kidney is slightly atrophic.  03-04-20: chest x-ray (Dr Darrol Angel report);  Bilateral pleural effusions R>L Increased interstitial markings bilateral upper lobes.  Bilateral pulmonary edema Significant osteoporosis Cardiomegaly   NO NEW EXAMS.   LABS REVIEWED PREVIOUS   02-09-20: wbc 14.3; hgb 8.5; hct 27.5; mcv 102.2 plt 207;  02-10-20: hgb a1c 7.0 02-12-20: glucose 241; bun 88; creat 2.71; k+ 5.0; na++ 138; ca 8.8 phos 5.1 albumin 2.7  02-25-20: wbc 14.3; hgb 8.5; hct 27.5; mcv 102.2 plt 207; glucose 102; bun 44; creat 2.70; k+ 6.4; na++ 140 ca 8.6 liver normal albumin 2.8 tsh  7.031vit D 77.07 02-26-20: glucose 99; bun 37; creat 2.54; k+ 5.8; na++ 141; liver normal albumin 2.7 02-28-20: glucose 120; bun 39; creat 2.66; k+ 6.9; na++ 140; ca 8.6  03-04-20: wbc 6.4; hgb 7.7; hct 26.4; mcv 107.3 plt 225; glucose 76; bun 30; creat 3.01; k+ 5.4; na++ 144; ca 8.8  03-05-20: glucose 72; bun 30; creat 3.24; k+ 5.4; na++ 144; ca 8.9   NO NEW LABS.   Review of Systems  Constitutional: Positive for malaise/fatigue.  Respiratory: Negative for cough.   Cardiovascular: Negative for chest pain and leg swelling.  Gastrointestinal: Negative for constipation and heartburn.  Musculoskeletal: Negative for back pain and joint pain.  Skin: Negative.   Neurological: Negative for dizziness.  Psychiatric/Behavioral: The patient is not nervous/anxious.     Physical Exam Constitutional:      General: She is not in acute distress.    Appearance: She is well-developed and well-nourished. She is not diaphoretic.  Neck:     Thyroid: No thyromegaly.  Cardiovascular:     Rate and Rhythm: Normal rate and regular rhythm.     Pulses: Normal pulses and  intact distal pulses.     Heart sounds: Murmur heard.    Pulmonary:     Effort: Pulmonary effort is normal. No respiratory distress.     Comments: 02 dependent  Breath sounds diminished  Abdominal:     General: Bowel sounds are normal. There is no distension.     Palpations: Abdomen is soft.     Tenderness: There is no abdominal tenderness.  Musculoskeletal:        General: No edema.     Cervical back: Neck supple.     Right lower leg: No edema.     Left lower leg: No edema.     Comments:  Able to move extremities Kyphosis       Lymphadenopathy:     Cervical: No cervical adenopathy.  Skin:    General: Skin is warm and dry.  Neurological:     Mental Status: She is alert. Mental status is at baseline.  Psychiatric:        Mood and Affect: Mood and affect and mood normal.      ASSESSMENT/ PLAN:  TODAY  1. Hyperlipidemia associated with type 2 diabetes mellitus: is stable is off lipitor  2. Aortic atherosclerosis: is stable will monitor   3. Coronary artery disease involving native coronary artery of native heart without angina: is off medications will monitor   PREVIOUS   4. History of CVA in adulthood: is stable will continue to monitor   5. Acquired hypothyroidism: is stable will continue NP thyroid 90 mg daily   6. Chronic interstitial lung disease: is without change does have some shortness of breath; is 02 dependent will continue advair 100/50 twice daily and has duoneb every 6 hours as needed has morphine 5 mg every 6 hours as needed and ativan 0.5 mg every 6 hours as needed.   7. Chronic constipation: is stable will continue benefiber daily  8. Vascular dementia without behavioral disturbance will monitor   9. Lower urinary tract symptoms (LUTS)/ chronic UTI  is stable will continue flomax 0.4 mg daily trimpex 100 mg daily   10. Chronic non-seasonal allergic rhinitis is stable will continue flonase daily  11. Chronic insomnia: is stable will continue  trazodone 50 mg nightly   12. GERD without esophagitis: is stable will continue pepcid 20 mg daily   13. 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 Type 2 diabetes mellitus with stage 4 chronic kidney disease and hypertension: is stable hgb a1c 7.9 will monitor   15. Hypertension associated with stage 4 chronic kidney disease due to type 2 diabetes mellitus: is stable b/p 144/41  will continue asa 81 mg daily norvasc 10 mg daily      MD is aware of resident's narcotic use and is in agreement with current plan of care. We will attempt to wean resident as appropriate.  Ok Edwards NP [facility]  Contact [phone] Monday through Friday 8am- 5pm  After hours call [phone]

## 2020-03-10 DIAGNOSIS — R627 Adult failure to thrive: Secondary | ICD-10-CM | POA: Insufficient documentation

## 2020-03-10 DIAGNOSIS — I501 Left ventricular failure: Secondary | ICD-10-CM | POA: Insufficient documentation

## 2020-03-11 ENCOUNTER — Other Ambulatory Visit (HOSPITAL_COMMUNITY)
Admission: RE | Admit: 2020-03-11 | Discharge: 2020-03-11 | Disposition: A | Discharge status: Home / Self Care | Payer: Medicare Other | Source: Skilled Nursing Facility | Attending: Adult Health | Admitting: Adult Health

## 2020-03-11 ENCOUNTER — Non-Acute Institutional Stay (SKILLED_NURSING_FACILITY): Payer: Medicare Other | Admitting: Adult Health

## 2020-03-11 ENCOUNTER — Encounter: Payer: Self-pay | Admitting: Adult Health

## 2020-03-11 DIAGNOSIS — N185 Chronic kidney disease, stage 5: Secondary | ICD-10-CM | POA: Diagnosis not present

## 2020-03-11 DIAGNOSIS — I129 Hypertensive chronic kidney disease with stage 1 through stage 4 chronic kidney disease, or unspecified chronic kidney disease: Secondary | ICD-10-CM | POA: Diagnosis present

## 2020-03-11 DIAGNOSIS — D631 Anemia in chronic kidney disease: Secondary | ICD-10-CM

## 2020-03-11 DIAGNOSIS — E875 Hyperkalemia: Secondary | ICD-10-CM

## 2020-03-11 DIAGNOSIS — R1310 Dysphagia, unspecified: Secondary | ICD-10-CM | POA: Insufficient documentation

## 2020-03-11 DIAGNOSIS — E1122 Type 2 diabetes mellitus with diabetic chronic kidney disease: Secondary | ICD-10-CM

## 2020-03-11 LAB — BASIC METABOLIC PANEL
Anion gap: 7 (ref 5–15)
BUN: 37 mg/dL — ABNORMAL HIGH (ref 8–23)
CO2: 31 mmol/L (ref 22–32)
Calcium: 9.1 mg/dL (ref 8.9–10.3)
Chloride: 106 mmol/L (ref 98–111)
Creatinine, Ser: 3.66 mg/dL — ABNORMAL HIGH (ref 0.44–1.00)
GFR, Estimated: 11 mL/min — ABNORMAL LOW (ref >60)
Glucose, Bld: 79 mg/dL (ref 70–99)
Potassium: 6 mmol/L — ABNORMAL HIGH (ref 3.5–5.1)
Sodium: 144 mmol/L (ref 135–145)

## 2020-03-11 LAB — HEMOGLOBIN AND HEMATOCRIT, BLOOD
HCT: 28.1 % — ABNORMAL LOW (ref 36.0–46.0)
Hemoglobin: 8.4 g/dL — ABNORMAL LOW (ref 12.0–15.0)

## 2020-03-11 NOTE — Progress Notes (Signed)
Location:  [place] Room Number: 151/P Place of Service:  SNF (31)   CODE STATUS: DNR  Allergies  Allergen Reactions  . Cymbalta [Duloxetine Hcl] Other (See Comments)    unknown  . Effexor [Venlafaxine] Other (See Comments)    unknown  . Morphine And Related Other (See Comments)    Passed out   . Tape     adhesive    Chief Complaint  Patient presents with  . Follow-up    Lab Follow Up    HPI:  She continues to hyperkalemia despite taking kayexalate several times. Her current level is 6.0. her renal failure has not had a significant change since starting lasix. Her po intake remains poor. Her ability to participate in therapy continues to fluctuate. There are no indications of pain present.     Past Medical History:  Diagnosis Date  . Anemia   . Arthritis   . Cerebral infarction ([place])   . Coronary artery disease   . Dementia ([place])   . Diabetes mellitus without complication (HCC)    Type 2  . Dysphagia due to recent cerebral infarction   . Failure to thrive (0-17)   . Hypercalcemia   . Hypertension   . Pneumonitis   . Protein calorie malnutrition ([place])   . Subdural hematoma ([place])   . Thyroid disease     History reviewed. No pertinent surgical history.  Social History   Socioeconomic History  . Marital status: Married    Spouse name: Not on file  . Number of children: Not on file  . Years of education: Not on file  . Highest education level: Not on file  Occupational History  . Not on file  Tobacco Use  . Smoking status: Never Smoker  . Smokeless tobacco: Never Used  Vaping Use  . Vaping Use: Never used  Substance and Sexual Activity  . Alcohol use: No  . Drug use: No  . Sexual activity: Not Currently  Other Topics Concern  . Not on file  Social History Narrative   Widow,husband passed away 2020,married for 57 years.Retired.   Social Determinants of Health   Financial Resource Strain: Not on file  Food Insecurity: Not  on file  Transportation Needs: Not on file  Physical Activity: Not on file  Stress: Not on file  Social Connections: Not on file  Intimate Partner Violence: Not on file   Family History  Family history unknown: Yes      VITAL SIGNS BP (!) 157/42   Pulse (!) 56   Temp 98.2 F (36.8 C)   Resp 20   Ht 5\' 3"  (1.6 m)   Wt 158 lb 9.6 oz (71.9 kg)   SpO2 97%   BMI 28.09 kg/m   Outpatient Encounter Medications as of 03/11/2020  Medication Sig  . Amino Acids-Protein Hydrolys (FEEDING SUPPLEMENT, PRO-STAT SUGAR FREE 64,) LIQD Take 30 mLs by mouth in the morning and at bedtime.  Marland Kitchen amLODipine (NORVASC) 10 MG tablet Take 10 mg by mouth daily.   . cetirizine (ZYRTEC) 10 MG tablet Take 10 mg by mouth daily as needed for allergies.  . famotidine (PEPCID) 20 MG tablet Take 20 mg by mouth daily.  . fluticasone (FLONASE) 50 MCG/ACT nasal spray Place 2 sprays into both nostrils daily.  . Fluticasone-Salmeterol (ADVAIR) 100-50 MCG/DOSE AEPB Inhale 1 puff into the lungs 2 (two) times daily.  . furosemide (LASIX) 40 MG tablet Take 40 mg by mouth daily.  Marland Kitchen ipratropium-albuterol (DUONEB)  0.5-2.5 (3) MG/3ML SOLN Take 3 mLs by nebulization every 6 (six) hours as needed.  Marland Kitchen LORazepam (ATIVAN) 2 MG/ML concentrated solution Take 0.5 mg by mouth every 6 (six) hours as needed for anxiety (or agitation).  . magnesium oxide (MAG-OX) 400 MG tablet Take 400 mg by mouth daily.   . NON FORMULARY Diet: _____ Regular, ___x___ NAS, _______Consistent Carbohydrate, _______NPO _____Other  . NP THYROID 90 MG tablet Take 1 tablet (90 mg total) by mouth daily.  Marland Kitchen oxyCODONE (ROXICODONE INTENSOL) 20 MG/ML concentrated solution Take 5 mg by mouth every 6 (six) hours as needed for severe pain.  . OXYGEN Inhale 3 L into the lungs continuous.  Marland Kitchen rOPINIRole (REQUIP) 0.5 MG tablet Take 0.5 mg by mouth at bedtime.  . sodium zirconium cyclosilicate (LOKELMA) 5 g packet Take 5 g by mouth daily. Special Instructions: for stage V  renal failure with hyperkalemia.  . tamsulosin (FLOMAX) 0.4 MG CAPS capsule Take 0.4 mg by mouth every evening.  . traZODone (DESYREL) 50 MG tablet Take 50 mg by mouth at bedtime.  Marland Kitchen trimethoprim (TRIMPEX) 100 MG tablet Take 100 mg by mouth daily. For UTI  . Wheat Dextrin (BENEFIBER PO) Take 1 packet by mouth daily.  . [DISCONTINUED] oxyCODONE HCl 10 MG/0.5ML CONC Take 5 mg by mouth every 6 (six) hours as needed.   No facility-administered encounter medications on file as of 03/11/2020.     SIGNIFICANT DIAGNOSTIC EXAMS   PREVIOUS   11-14-16:  Ct of head:  1. No acute intracranial abnormality identified. 2. 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 3. Moderate for age chronic microvascular ischemic changes and parenchymal volume loss of the brain. 4. Bilateral mastoid effusions. 5. 12 mm nodule left suboccipital scalp, direct visualization recommended, probably sebaceous cyst.  02-05-20: chest x-ray:  1. Chronic interstitial lung disease with mild left basilar atelectasis and/or infiltrate. 2. Small left pleural effusion.  02-06-20: ct of chest:  1. Small bilateral pleural effusions and associated atelectasis or consolidation. 2. Diffuse interlobular septal thickening, consistent with pulmonary edema. 3. Diffuse bilateral bronchial wall thickening, nonspecific and may reflect infectious or inflammatory bronchitis or edema. 4. Bandlike scarring and or atelectasis of the left lower lobe and right middle lobe, most likely sequelae of prior infection. 5. No specific findings to suggest fibrotic interstitial lung disease, although assessment is limited given the presence of edema and effusions. 6. Cardiomegaly and coronary artery disease. 7. Enlargement of the main pulmonary artery, as can be seen in pulmonary hypertension. 8. Enlarged mediastinal lymph nodes, nonspecific and likely reactive.  9. Aortic  Atherosclerosis   02-08-20: renal ultrasound:  1. No acute abnormality. No hydronephrosis or bladder distention. 2. Bilateral simple renal cysts. Bilateral renal cortical thinning. Right kidney is slightly atrophic.  03-04-20: chest x-ray (Dr Darrol Angel report);  Bilateral pleural effusions R>L Increased interstitial markings bilateral upper lobes.  Bilateral pulmonary edema Significant osteoporosis Cardiomegaly   NO NEW EXAMS.   LABS REVIEWED PREVIOUS   02-09-20: wbc 14.3; hgb 8.5; hct 27.5; mcv 102.2 plt 207;  02-10-20: hgb a1c 7.0 02-12-20: glucose 241; bun 88; creat 2.71; k+ 5.0; na++ 138; ca 8.8 phos 5.1 albumin 2.7  02-25-20: wbc 14.3; hgb 8.5; hct 27.5; mcv 102.2 plt 207; glucose 102; bun 44; creat 2.70; k+ 6.4; na++ 140 ca 8.6 liver normal albumin 2.8 tsh 7.031vit D 77.07 02-26-20: glucose 99; bun 37; creat 2.54; k+ 5.8; na++ 141; liver normal albumin 2.7  02-28-20: glucose 120; bun 39; creat 2.66; k+ 6.9; na++ 140; ca 8.6  03-04-20: wbc 6.4; hgb 7.7; hct 26.4; mcv 107.3 plt 225; glucose 76; bun 30; creat 3.01; k+ 5.4; na++ 144; ca 8.8  03-05-20: glucose 72; bun 30; creat 3.24; k+ 5.4; na++ 144; ca 8.9   TODAY  03-11-20: hgb 8.4; hct 28.1; glucose 79; bun 37; creat 3.66; k+ 6.0; na++ 144 ca 9.1 GFR 11   Review of Systems  Constitutional: Negative for malaise/fatigue.  Respiratory: Negative for cough and shortness of breath.   Cardiovascular: Negative for chest pain, palpitations and leg swelling.  Gastrointestinal: Negative for abdominal pain, constipation and heartburn.  Musculoskeletal: Negative for back pain, joint pain and myalgias.  Skin: Negative.   Neurological: Negative for dizziness.  Psychiatric/Behavioral: The patient is not nervous/anxious.    .   Physical Exam Constitutional:      General: She is not in acute distress.    Appearance: She is well-developed and well-nourished. She is not diaphoretic.  Neck:     Thyroid: No thyromegaly.  Cardiovascular:      Rate and Rhythm: Normal rate and regular rhythm.     Pulses: Normal pulses and intact distal pulses.     Heart sounds: Murmur heard.    Pulmonary:     Effort: Pulmonary effort is normal. No respiratory distress.     Breath sounds: Normal breath sounds.     Comments: 02 dependent  Abdominal:     General: Bowel sounds are normal. There is no distension.     Palpations: Abdomen is soft.     Tenderness: There is no abdominal tenderness.  Musculoskeletal:        General: No edema.     Cervical back: Neck supple.     Right lower leg: No edema.     Left lower leg: No edema.     Comments: Able to move extremities Kyphosis        Lymphadenopathy:     Cervical: No cervical adenopathy.  Skin:    General: Skin is warm and dry.  Neurological:     Mental Status: She is alert. Mental status is at baseline.  Psychiatric:        Mood and Affect: Mood and affect and mood normal.     ASSESSMENT/ PLAN:  TODAY  1. Hyperkalemia 2. CKD stage 5 due to type 2 diabetes mellitus 3. Anemia due to stage 5 chronic kidney disease   Is without change k+ 6.0 will begin lotkelma 5 gm daily  Will repeat BMP on 03-18-20   MD is aware of resident's narcotic use and is in agreement with current plan of care. We will attempt to wean resident as appropriate.  Ok Edwards NP [facility]  Contact [phone] Monday through Friday 8am- 5pm  After hours call [phone]

## 2020-03-13 ENCOUNTER — Encounter (INDEPENDENT_AMBULATORY_CARE_PROVIDER_SITE_OTHER): Payer: Medicare Other | Admitting: Ophthalmology

## 2020-03-13 DIAGNOSIS — N185 Chronic kidney disease, stage 5: Secondary | ICD-10-CM | POA: Insufficient documentation

## 2020-03-14 ENCOUNTER — Encounter (HOSPITAL_COMMUNITY)
Admission: RE | Admit: 2020-03-14 | Discharge: 2020-03-14 | Disposition: A | Discharge status: Home / Self Care | Payer: Medicare Other | Source: Skilled Nursing Facility | Attending: Internal Medicine | Admitting: Internal Medicine

## 2020-03-14 DIAGNOSIS — I129 Hypertensive chronic kidney disease with stage 1 through stage 4 chronic kidney disease, or unspecified chronic kidney disease: Secondary | ICD-10-CM | POA: Diagnosis not present

## 2020-03-14 DIAGNOSIS — R0602 Shortness of breath: Secondary | ICD-10-CM | POA: Diagnosis not present

## 2020-03-14 DIAGNOSIS — Z79899 Other long term (current) drug therapy: Secondary | ICD-10-CM | POA: Diagnosis not present

## 2020-03-14 DIAGNOSIS — N189 Chronic kidney disease, unspecified: Secondary | ICD-10-CM | POA: Diagnosis not present

## 2020-03-14 DIAGNOSIS — I5032 Chronic diastolic (congestive) heart failure: Secondary | ICD-10-CM | POA: Diagnosis not present

## 2020-03-14 DIAGNOSIS — N281 Cyst of kidney, acquired: Secondary | ICD-10-CM | POA: Diagnosis not present

## 2020-03-14 DIAGNOSIS — E875 Hyperkalemia: Secondary | ICD-10-CM | POA: Diagnosis not present

## 2020-03-14 DIAGNOSIS — E1122 Type 2 diabetes mellitus with diabetic chronic kidney disease: Secondary | ICD-10-CM | POA: Diagnosis not present

## 2020-03-14 DIAGNOSIS — D638 Anemia in other chronic diseases classified elsewhere: Secondary | ICD-10-CM | POA: Diagnosis not present

## 2020-03-14 LAB — PROTEIN / CREATININE RATIO, URINE
Creatinine, Urine: 65.74 mg/dL
Protein Creatinine Ratio: 3.18 mg/mg{Cre} — ABNORMAL HIGH (ref 0.00–0.15)
Total Protein, Urine: 209 mg/dL

## 2020-03-17 ENCOUNTER — Encounter (HOSPITAL_COMMUNITY)
Admission: RE | Admit: 2020-03-17 | Discharge: 2020-03-17 | Disposition: A | Discharge status: Home / Self Care | Payer: Medicare Other | Source: Skilled Nursing Facility | Attending: Internal Medicine | Admitting: Internal Medicine

## 2020-03-17 DIAGNOSIS — I5032 Chronic diastolic (congestive) heart failure: Secondary | ICD-10-CM | POA: Insufficient documentation

## 2020-03-17 DIAGNOSIS — N281 Cyst of kidney, acquired: Secondary | ICD-10-CM | POA: Insufficient documentation

## 2020-03-17 DIAGNOSIS — I132 Hypertensive heart and chronic kidney disease with heart failure and with stage 5 chronic kidney disease, or end stage renal disease: Secondary | ICD-10-CM | POA: Insufficient documentation

## 2020-03-17 DIAGNOSIS — N185 Chronic kidney disease, stage 5: Secondary | ICD-10-CM | POA: Insufficient documentation

## 2020-03-17 DIAGNOSIS — Z7951 Long term (current) use of inhaled steroids: Secondary | ICD-10-CM | POA: Insufficient documentation

## 2020-03-17 DIAGNOSIS — D631 Anemia in chronic kidney disease: Secondary | ICD-10-CM | POA: Insufficient documentation

## 2020-03-17 DIAGNOSIS — E1122 Type 2 diabetes mellitus with diabetic chronic kidney disease: Secondary | ICD-10-CM | POA: Insufficient documentation

## 2020-03-17 DIAGNOSIS — Z79899 Other long term (current) drug therapy: Secondary | ICD-10-CM | POA: Insufficient documentation

## 2020-03-17 DIAGNOSIS — E875 Hyperkalemia: Secondary | ICD-10-CM | POA: Insufficient documentation

## 2020-03-17 LAB — CBC WITH DIFFERENTIAL/PLATELET
Abs Immature Granulocytes: 0.15 10*3/uL — ABNORMAL HIGH (ref 0.00–0.07)
Basophils Absolute: 0 10*3/uL (ref 0.0–0.1)
Basophils Relative: 0 %
Eosinophils Absolute: 0.3 10*3/uL (ref 0.0–0.5)
Eosinophils Relative: 3 %
HCT: 29.9 % — ABNORMAL LOW (ref 36.0–46.0)
Hemoglobin: 8.7 g/dL — ABNORMAL LOW (ref 12.0–15.0)
Immature Granulocytes: 2 %
Lymphocytes Relative: 20 %
Lymphs Abs: 2 10*3/uL (ref 0.7–4.0)
MCH: 30.5 pg (ref 26.0–34.0)
MCHC: 29.1 g/dL — ABNORMAL LOW (ref 30.0–36.0)
MCV: 104.9 fL — ABNORMAL HIGH (ref 80.0–100.0)
Monocytes Absolute: 0.8 10*3/uL (ref 0.1–1.0)
Monocytes Relative: 8 %
Neutro Abs: 6.8 10*3/uL (ref 1.7–7.7)
Neutrophils Relative %: 67 %
Platelets: 220 10*3/uL (ref 150–400)
RBC: 2.85 MIL/uL — ABNORMAL LOW (ref 3.87–5.11)
RDW: 14.3 % (ref 11.5–15.5)
WBC: 10 10*3/uL (ref 4.0–10.5)
nRBC: 0 % (ref 0.0–0.2)

## 2020-03-17 LAB — IRON: Iron: 30 ug/dL (ref 28–170)

## 2020-03-17 LAB — COMPREHENSIVE METABOLIC PANEL
ALT: 11 U/L (ref 0–44)
AST: 13 U/L — ABNORMAL LOW (ref 15–41)
Albumin: 2.7 g/dL — ABNORMAL LOW (ref 3.5–5.0)
Alkaline Phosphatase: 63 U/L (ref 38–126)
Anion gap: 8 (ref 5–15)
BUN: 38 mg/dL — ABNORMAL HIGH (ref 8–23)
CO2: 33 mmol/L — ABNORMAL HIGH (ref 22–32)
Calcium: 9.1 mg/dL (ref 8.9–10.3)
Chloride: 105 mmol/L (ref 98–111)
Creatinine, Ser: 4.04 mg/dL — ABNORMAL HIGH (ref 0.44–1.00)
GFR, Estimated: 10 mL/min — ABNORMAL LOW (ref >60)
Glucose, Bld: 78 mg/dL (ref 70–99)
Potassium: 5.5 mmol/L — ABNORMAL HIGH (ref 3.5–5.1)
Sodium: 146 mmol/L — ABNORMAL HIGH (ref 135–145)
Total Bilirubin: 0.2 mg/dL — ABNORMAL LOW (ref 0.3–1.2)
Total Protein: 5.3 g/dL — ABNORMAL LOW (ref 6.5–8.1)

## 2020-03-17 LAB — FERRITIN: Ferritin: 34 ng/mL (ref 11–307)

## 2020-03-17 LAB — HEMOGLOBIN A1C
Hgb A1c MFr Bld: 5.6 % (ref 4.8–5.6)
Mean Plasma Glucose: 114.02 mg/dL

## 2020-03-18 ENCOUNTER — Non-Acute Institutional Stay (SKILLED_NURSING_FACILITY): Payer: Medicare Other | Admitting: Adult Health

## 2020-03-18 ENCOUNTER — Other Ambulatory Visit (HOSPITAL_COMMUNITY)
Admission: RE | Admit: 2020-03-18 | Discharge: 2020-03-18 | Disposition: A | Discharge status: Home / Self Care | Payer: Medicare Other | Source: Skilled Nursing Facility | Attending: Internal Medicine | Admitting: Internal Medicine

## 2020-03-18 ENCOUNTER — Encounter: Payer: Self-pay | Admitting: Adult Health

## 2020-03-18 DIAGNOSIS — N185 Chronic kidney disease, stage 5: Secondary | ICD-10-CM | POA: Insufficient documentation

## 2020-03-18 DIAGNOSIS — E43 Unspecified severe protein-calorie malnutrition: Secondary | ICD-10-CM | POA: Diagnosis not present

## 2020-03-18 DIAGNOSIS — J849 Interstitial pulmonary disease, unspecified: Secondary | ICD-10-CM | POA: Diagnosis not present

## 2020-03-18 DIAGNOSIS — E875 Hyperkalemia: Secondary | ICD-10-CM

## 2020-03-18 LAB — MAGNESIUM: Magnesium: 2.7 mg/dL — ABNORMAL HIGH (ref 1.7–2.4)

## 2020-03-18 LAB — BASIC METABOLIC PANEL
Anion gap: 8 (ref 5–15)
BUN: 40 mg/dL — ABNORMAL HIGH (ref 8–23)
CO2: 31 mmol/L (ref 22–32)
Calcium: 9.1 mg/dL (ref 8.9–10.3)
Chloride: 104 mmol/L (ref 98–111)
Creatinine, Ser: 4.51 mg/dL — ABNORMAL HIGH (ref 0.44–1.00)
GFR, Estimated: 9 mL/min — ABNORMAL LOW (ref >60)
Glucose, Bld: 92 mg/dL (ref 70–99)
Potassium: 5.6 mmol/L — ABNORMAL HIGH (ref 3.5–5.1)
Sodium: 143 mmol/L (ref 135–145)

## 2020-03-18 LAB — PHOSPHORUS: Phosphorus: 4.2 mg/dL (ref 2.5–4.6)

## 2020-03-18 NOTE — Progress Notes (Signed)
Location:  [facility] Room Number: 151-P Place of Service:  SNF (31)   CODE STATUS: DNR  Allergies  Allergen Reactions  . Cymbalta [Duloxetine Hcl] Other (See Comments)    unknown  . Effexor [Venlafaxine] Other (See Comments)    unknown  . Morphine And Related Other (See Comments)    Passed out   . Tape     adhesive    Chief Complaint  Patient presents with  . Acute Visit    Care plan meeting     HPI:  We have come together for her care plan meeting; family present. Her family is wanting her to go to assisted living if possible. She requires moderate assist with upper body and max assist with lower body. She requires max assist with brp; she is incontinent of bladder and bowel. . For transfers she requires min to mod assist. She is not ambulatory; will propel self in wheelchair. She has had little progression in therapy and is reaching a plateau. She remains 02 dependent; her family would like to try to wean this off if possible. Her albumin is low at 2.7; she remains hyperkalemic. She will continue to be followed for her chronic illnesses including: Hyperkalemia Chronic interstitial lung disease  Protein calorie malnutrition severe    Past Medical History:  Diagnosis Date  . Anemia   . Arthritis   . Cerebral infarction ([place])   . Coronary artery disease   . Dementia ([place])   . Diabetes mellitus without complication (HCC)    Type 2  . Dysphagia due to recent cerebral infarction   . Failure to thrive (0-17)   . Hypercalcemia   . Hypertension   . Pneumonitis   . Protein calorie malnutrition ([place])   . Subdural hematoma ([place])   . Thyroid disease     History reviewed. No pertinent surgical history.  Social History   Socioeconomic History  . Marital status: Married    Spouse name: Not on file  . Number of children: Not on file  . Years of education: Not on file  . Highest education level: Not on file  Occupational History  . Not on file   Tobacco Use  . Smoking status: Never Smoker  . Smokeless tobacco: Never Used  Vaping Use  . Vaping Use: Never used  Substance and Sexual Activity  . Alcohol use: No  . Drug use: No  . Sexual activity: Not Currently  Other Topics Concern  . Not on file  Social History Narrative   Widow,husband passed away 2020,married for 57 years.Retired.   Social Determinants of Health   Financial Resource Strain: Not on file  Food Insecurity: Not on file  Transportation Needs: Not on file  Physical Activity: Not on file  Stress: Not on file  Social Connections: Not on file  Intimate Partner Violence: Not on file   Family History  Family history unknown: Yes      VITAL SIGNS BP (!) 157/42   Pulse (!) 56   Temp 98.2 F (36.8 C)   Resp 20   Ht 5\' 3"  (1.6 m)   Wt 146 lb 3.2 oz (66.3 kg)   SpO2 98%   BMI 25.90 kg/m   Outpatient Encounter Medications as of 03/18/2020  Medication Sig  . Amino Acids-Protein Hydrolys (FEEDING SUPPLEMENT, PRO-STAT SUGAR FREE 64,) LIQD Take 30 mLs by mouth in the morning and at bedtime.  Marland Kitchen amLODipine (NORVASC) 5 MG tablet Take 5 mg by mouth daily.  Marland Kitchen  cetirizine (ZYRTEC) 10 MG tablet Take 10 mg by mouth daily as needed for allergies.  . famotidine (PEPCID) 20 MG tablet Take 20 mg by mouth daily.  . fluticasone (FLONASE) 50 MCG/ACT nasal spray Place 2 sprays into both nostrils daily.  . Fluticasone-Salmeterol (ADVAIR) 100-50 MCG/DOSE AEPB Inhale 1 puff into the lungs 2 (two) times daily.  Marland Kitchen ipratropium-albuterol (DUONEB) 0.5-2.5 (3) MG/3ML SOLN Take 3 mLs by nebulization every 6 (six) hours as needed.  Marland Kitchen LORazepam (ATIVAN) 2 MG/ML concentrated solution Take 0.5 mg by mouth every 6 (six) hours as needed for anxiety (or agitation).  . magnesium oxide (MAG-OX) 400 MG tablet Take 400 mg by mouth daily.   . NON FORMULARY Diet: _____ Regular, ___x___ NAS, _______Consistent Carbohydrate, _______NPO _____Other  . NP THYROID 90 MG tablet Take 1 tablet (90 mg  total) by mouth daily.  Marland Kitchen oxyCODONE (ROXICODONE INTENSOL) 20 MG/ML concentrated solution Take 5 mg by mouth every 6 (six) hours as needed for severe pain.  . OXYGEN Inhale 3 L into the lungs continuous.  Marland Kitchen rOPINIRole (REQUIP) 0.5 MG tablet Take 0.5 mg by mouth at bedtime.  . sodium zirconium cyclosilicate (LOKELMA) 5 g packet Take 5 g by mouth daily. Special Instructions: for stage V renal failure with hyperkalemia.  . tamsulosin (FLOMAX) 0.4 MG CAPS capsule Take 0.4 mg by mouth every evening.  . traZODone (DESYREL) 50 MG tablet Take 50 mg by mouth at bedtime.  Marland Kitchen trimethoprim (TRIMPEX) 100 MG tablet Take 100 mg by mouth daily. For UTI  . [DISCONTINUED] amLODipine (NORVASC) 10 MG tablet Take 10 mg by mouth daily.   . [DISCONTINUED] furosemide (LASIX) 40 MG tablet Take 40 mg by mouth daily.  . [DISCONTINUED] Wheat Dextrin (BENEFIBER PO) Take 1 packet by mouth daily.   No facility-administered encounter medications on file as of 03/18/2020.     SIGNIFICANT DIAGNOSTIC EXAMS   PREVIOUS   11-14-16:  Ct of head:  1. No acute intracranial abnormality identified. 2. 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 3. Moderate for age chronic microvascular ischemic changes and parenchymal volume loss of the brain. 4. Bilateral mastoid effusions. 5. 12 mm nodule left suboccipital scalp, direct visualization recommended, probably sebaceous cyst.  02-05-20: chest x-ray:  1. Chronic interstitial lung disease with mild left basilar atelectasis and/or infiltrate. 2. Small left pleural effusion.  02-06-20: ct of chest:  1. Small bilateral pleural effusions and associated atelectasis or consolidation. 2. Diffuse interlobular septal thickening, consistent with pulmonary edema. 3. Diffuse bilateral bronchial wall thickening, nonspecific and may reflect infectious or inflammatory bronchitis or edema. 4. Bandlike scarring and or  atelectasis of the left lower lobe and right middle lobe, most likely sequelae of prior infection. 5. No specific findings to suggest fibrotic interstitial lung disease, although assessment is limited given the presence of edema and effusions. 6. Cardiomegaly and coronary artery disease. 7. Enlargement of the main pulmonary artery, as can be seen in pulmonary hypertension. 8. Enlarged mediastinal lymph nodes, nonspecific and likely reactive.  9. Aortic Atherosclerosis   02-08-20: renal ultrasound:  1. No acute abnormality. No hydronephrosis or bladder distention. 2. Bilateral simple renal cysts. Bilateral renal cortical thinning. Right kidney is slightly atrophic.  03-04-20: chest x-ray (Dr Darrol Angel report);  Bilateral pleural effusions R>L Increased interstitial markings bilateral upper lobes.  Bilateral pulmonary edema Significant osteoporosis Cardiomegaly   NO NEW EXAMS.   LABS REVIEWED PREVIOUS   02-09-20: wbc 14.3; hgb 8.5;  hct 27.5; mcv 102.2 plt 207;  02-10-20: hgb a1c 7.0 02-12-20: glucose 241; bun 88; creat 2.71; k+ 5.0; na++ 138; ca 8.8 phos 5.1 albumin 2.7  02-25-20: wbc 14.3; hgb 8.5; hct 27.5; mcv 102.2 plt 207; glucose 102; bun 44; creat 2.70; k+ 6.4; na++ 140 ca 8.6 liver normal albumin 2.8 tsh 7.031vit D 77.07 02-26-20: glucose 99; bun 37; creat 2.54; k+ 5.8; na++ 141; liver normal albumin 2.7 02-28-20: glucose 120; bun 39; creat 2.66; k+ 6.9; na++ 140; ca 8.6  03-04-20: wbc 6.4; hgb 7.7; hct 26.4; mcv 107.3 plt 225; glucose 76; bun 30; creat 3.01; k+ 5.4; na++ 144; ca 8.8  03-05-20: glucose 72; bun 30; creat 3.24; k+ 5.4; na++ 144; ca 8.9   TODAY  03-11-20: hgb 8.4; hct 28.1; glucose 79; bun 37; creat 3.66; k+ 6.0; na++ 144 ca 9.1 GFR 11  03-18-20: glucose 92; bun 40; creat 4.51; k+ 5.6; na++ 143; ca 9.1 GFR 9   Review of Systems  Constitutional: Negative for malaise/fatigue.  Respiratory: Negative for cough and shortness of breath.   Cardiovascular: Negative for  chest pain, palpitations and leg swelling.  Gastrointestinal: Negative for abdominal pain, constipation and heartburn.  Musculoskeletal: Negative for back pain, joint pain and myalgias.  Skin: Negative.   Neurological: Negative for dizziness.  Psychiatric/Behavioral: The patient is not nervous/anxious.     Physical Exam Constitutional:      General: She is not in acute distress.    Appearance: She is well-developed and well-nourished. She is not diaphoretic.  Neck:     Thyroid: No thyromegaly.  Cardiovascular:     Rate and Rhythm: Normal rate and regular rhythm.     Pulses: Normal pulses and intact distal pulses.     Heart sounds: Murmur heard.    Pulmonary:     Effort: Pulmonary effort is normal. No respiratory distress.     Breath sounds: Normal breath sounds.     Comments: 02 dependent  Abdominal:     General: Bowel sounds are normal. There is no distension.     Palpations: Abdomen is soft.     Tenderness: There is no abdominal tenderness.  Musculoskeletal:        General: No edema.     Cervical back: Neck supple.     Right lower leg: No edema.     Left lower leg: No edema.     Comments: Able to move extremities Kyphosis         Lymphadenopathy:     Cervical: No cervical adenopathy.  Skin:    General: Skin is warm and dry.  Neurological:     Mental Status: She is alert. Mental status is at baseline.  Psychiatric:        Mood and Affect: Mood and affect and mood normal.       ASSESSMENT/ PLAN:  TODAY  1. Hyperkalemia 2. Chronic interstitial lung disease 3. Protein calorie malnutrition severe  Will check BMP 03-24-20 Will increase lokelma to 10 gm daily Will increase prostat to 30 ml three times daily Will wean off 02 as able.  Will monitor her status.  Her goal is assisted living at this time.    MD is aware of resident's narcotic use and is in agreement with current plan of care. We will attempt to wean resident as appropriate.  Ok Edwards  NP [facility]  Contact [phone] Monday through Friday 8am- 5pm  After hours call [phone]

## 2020-03-19 ENCOUNTER — Other Ambulatory Visit: Payer: Self-pay | Admitting: *Deleted

## 2020-03-19 NOTE — Patient Outreach (Signed)
Member screened for potential [facility] Care Management needs. Regina Bush resides in [facility].   Communication sent to [facility] SW to inquire about transition plans.    Marthenia Rolling, MSN, RN,BSN [place] Acute Care Coordinator [phone] [facility]) [phone]  (Toll free office)

## 2020-03-20 ENCOUNTER — Other Ambulatory Visit (HOSPITAL_COMMUNITY)
Admission: RE | Admit: 2020-03-20 | Discharge: 2020-03-20 | Disposition: A | Discharge status: Home / Self Care | Payer: Medicare Other | Source: Skilled Nursing Facility | Attending: Internal Medicine | Admitting: Internal Medicine

## 2020-03-20 DIAGNOSIS — N185 Chronic kidney disease, stage 5: Secondary | ICD-10-CM | POA: Diagnosis present

## 2020-03-20 LAB — COMPREHENSIVE METABOLIC PANEL
ALT: 9 U/L (ref 0–44)
AST: 11 U/L — ABNORMAL LOW (ref 15–41)
Albumin: 2.4 g/dL — ABNORMAL LOW (ref 3.5–5.0)
Alkaline Phosphatase: 56 U/L (ref 38–126)
Anion gap: 7 (ref 5–15)
BUN: 53 mg/dL — ABNORMAL HIGH (ref 8–23)
CO2: 29 mmol/L (ref 22–32)
Calcium: 8.6 mg/dL — ABNORMAL LOW (ref 8.9–10.3)
Chloride: 106 mmol/L (ref 98–111)
Creatinine, Ser: 4.08 mg/dL — ABNORMAL HIGH (ref 0.44–1.00)
GFR, Estimated: 10 mL/min — ABNORMAL LOW (ref >60)
Glucose, Bld: 79 mg/dL (ref 70–99)
Potassium: 5.3 mmol/L — ABNORMAL HIGH (ref 3.5–5.1)
Sodium: 142 mmol/L (ref 135–145)
Total Bilirubin: 0.4 mg/dL (ref 0.3–1.2)
Total Protein: 4.8 g/dL — ABNORMAL LOW (ref 6.5–8.1)

## 2020-03-20 LAB — ANCA TITERS
Atypical P-ANCA titer: <1:20 {titer}
C-ANCA: <1:20 {titer}
P-ANCA: <1:20 {titer}

## 2020-03-21 ENCOUNTER — Other Ambulatory Visit: Payer: Self-pay | Admitting: *Deleted

## 2020-03-21 LAB — PARATHYROID HORMONE, INTACT (NO CA): PTH: 43 pg/mL (ref 15–65)

## 2020-03-21 NOTE — Patient Outreach (Signed)
[facility] Post- Acute Care Coordinator follow up. Member screened for potential [facility] Care Management needs.  Update received from [facility] indicating member's transition plan is to return ALF.   No identifiable [facility] Care Management needs at this time.    Marthenia Rolling, MSN, RN,BSN [place] Acute Care Coordinator [phone] [facility]) [phone]  (Toll free office)

## 2020-03-24 ENCOUNTER — Encounter (HOSPITAL_COMMUNITY)
Admission: RE | Admit: 2020-03-24 | Discharge: 2020-03-24 | Disposition: A | Discharge status: Home / Self Care | Payer: Medicare Other | Source: Skilled Nursing Facility | Attending: Internal Medicine | Admitting: Internal Medicine

## 2020-03-24 LAB — BASIC METABOLIC PANEL
Anion gap: 8 (ref 5–15)
BUN: 55 mg/dL — ABNORMAL HIGH (ref 8–23)
CO2: 30 mmol/L (ref 22–32)
Calcium: 9.3 mg/dL (ref 8.9–10.3)
Chloride: 104 mmol/L (ref 98–111)
Creatinine, Ser: 4.07 mg/dL — ABNORMAL HIGH (ref 0.44–1.00)
GFR, Estimated: 10 mL/min — ABNORMAL LOW (ref >60)
Glucose, Bld: 83 mg/dL (ref 70–99)
Potassium: 5.5 mmol/L — ABNORMAL HIGH (ref 3.5–5.1)
Sodium: 142 mmol/L (ref 135–145)

## 2020-03-24 LAB — VITAMIN D 25 HYDROXY (VIT D DEFICIENCY, FRACTURES): Vit D, 25-Hydroxy: 75.09 ng/mL (ref 30–100)

## 2020-03-24 LAB — URIC ACID: Uric Acid, Serum: 8.1 mg/dL — ABNORMAL HIGH (ref 2.5–7.1)

## 2020-03-24 LAB — VITAMIN B12: Vitamin B-12: 318 pg/mL (ref 180–914)

## 2020-03-25 ENCOUNTER — Non-Acute Institutional Stay (SKILLED_NURSING_FACILITY): Payer: Medicare Other | Admitting: Adult Health

## 2020-03-25 DIAGNOSIS — N185 Chronic kidney disease, stage 5: Secondary | ICD-10-CM

## 2020-03-25 DIAGNOSIS — E1122 Type 2 diabetes mellitus with diabetic chronic kidney disease: Secondary | ICD-10-CM

## 2020-03-25 DIAGNOSIS — E790 Hyperuricemia without signs of inflammatory arthritis and tophaceous disease: Secondary | ICD-10-CM | POA: Diagnosis not present

## 2020-03-25 DIAGNOSIS — E875 Hyperkalemia: Secondary | ICD-10-CM | POA: Diagnosis not present

## 2020-03-25 LAB — MISC LABCORP TEST (SEND OUT)
LabCorp test name: 141330
Labcorp test code: 141330

## 2020-03-25 LAB — ANA: Anti Nuclear Antibody (ANA): NEGATIVE

## 2020-03-28 ENCOUNTER — Other Ambulatory Visit (HOSPITAL_COMMUNITY)
Admission: RE | Admit: 2020-03-28 | Discharge: 2020-03-28 | Disposition: A | Discharge status: Home / Self Care | Payer: Medicare Other | Source: Skilled Nursing Facility | Attending: Internal Medicine | Admitting: Internal Medicine

## 2020-03-28 DIAGNOSIS — N185 Chronic kidney disease, stage 5: Secondary | ICD-10-CM | POA: Diagnosis present

## 2020-03-28 LAB — HEPATITIS C ANTIBODY: HCV Ab: NONREACTIVE

## 2020-03-28 LAB — HEPATITIS B SURFACE ANTIGEN: Hepatitis B Surface Ag: NONREACTIVE

## 2020-03-29 ENCOUNTER — Encounter (HOSPITAL_COMMUNITY)
Admission: RE | Admit: 2020-03-29 | Discharge: 2020-03-29 | Disposition: A | Discharge status: Home / Self Care | Payer: Medicare Other | Source: Skilled Nursing Facility | Attending: Internal Medicine | Admitting: Internal Medicine

## 2020-03-29 LAB — HEPATITIS B SURFACE ANTIBODY, QUANTITATIVE: Hep B S AB Quant (Post): <3.1 m[IU]/mL — ABNORMAL LOW (ref >9.9)

## 2020-03-30 ENCOUNTER — Encounter (HOSPITAL_COMMUNITY)
Admission: RE | Admit: 2020-03-30 | Discharge: 2020-03-30 | Disposition: A | Discharge status: Home / Self Care | Payer: Medicare Other | Source: Skilled Nursing Facility | Attending: Internal Medicine | Admitting: Internal Medicine

## 2020-03-30 LAB — HIV ANTIBODY (ROUTINE TESTING W REFLEX): HIV Screen 4th Generation wRfx: NONREACTIVE

## 2020-03-30 LAB — C3 COMPLEMENT: C3 Complement: 114 mg/dL (ref 82–167)

## 2020-03-30 LAB — C4 COMPLEMENT: Complement C4, Body Fluid: 47 mg/dL — ABNORMAL HIGH (ref 12–38)

## 2020-04-01 ENCOUNTER — Other Ambulatory Visit (HOSPITAL_COMMUNITY)
Admission: RE | Admit: 2020-04-01 | Discharge: 2020-04-01 | Disposition: A | Discharge status: Home / Self Care | Payer: Medicare Other | Source: Skilled Nursing Facility | Attending: Internal Medicine | Admitting: Internal Medicine

## 2020-04-01 DIAGNOSIS — N185 Chronic kidney disease, stage 5: Secondary | ICD-10-CM | POA: Diagnosis present

## 2020-04-02 DIAGNOSIS — E790 Hyperuricemia without signs of inflammatory arthritis and tophaceous disease: Secondary | ICD-10-CM | POA: Insufficient documentation

## 2020-04-02 LAB — PROTEIN ELECTROPHORESIS, SERUM
A/G Ratio: 1.1 (ref 0.7–1.7)
Albumin ELP: 2.9 g/dL (ref 2.9–4.4)
Alpha-1-Globulin: 0.2 g/dL (ref 0.0–0.4)
Alpha-2-Globulin: 0.9 g/dL (ref 0.4–1.0)
Beta Globulin: 1 g/dL (ref 0.7–1.3)
Gamma Globulin: 0.5 g/dL (ref 0.4–1.8)
Globulin, Total: 2.6 g/dL (ref 2.2–3.9)
M-Spike, %: 0.2 g/dL — ABNORMAL HIGH
Total Protein ELP: 5.5 g/dL — ABNORMAL LOW (ref 6.0–8.5)

## 2020-04-02 LAB — KAPPA/LAMBDA LIGHT CHAINS
Kappa free light chain: 39 mg/L — ABNORMAL HIGH (ref 3.3–19.4)
Kappa, lambda light chain ratio: 1.29 (ref 0.26–1.65)
Lambda free light chains: 30.3 mg/L — ABNORMAL HIGH (ref 5.7–26.3)

## 2020-04-02 NOTE — Progress Notes (Signed)
Location:  [facility] Room Number: 683 Place of Service:  SNF (31)   CODE STATUS: dnr  Allergies  Allergen Reactions  . Cymbalta [Duloxetine Hcl] Other (See Comments)    unknown  . Effexor [Venlafaxine] Other (See Comments)    unknown  . Morphine And Related Other (See Comments)    Passed out   . Tape     adhesive    Chief Complaint  Patient presents with  . Acute Visit    For follow up labs.     HPI:  She was seen by nephrology on 03-14-20. There were labs ordered; her k+ remains elevated at 5.5 and is taking lokelma 10 gm daily. Her family is concerned that she has a uti; as she is angry with her family. They state that this is always an indication of infection. I have spent about 30 minutes discussing the signs and symptoms of uti and that anger was not one of them. She denies any dysuria; no increased frequency. She denies any back pain. There are no reports of n/v.   Past Medical History:  Diagnosis Date  . Anemia   . Arthritis   . Cerebral infarction ([place])   . Coronary artery disease   . Dementia ([place])   . Diabetes mellitus without complication (HCC)    Type 2  . Dysphagia due to recent cerebral infarction   . Failure to thrive (0-17)   . Hypercalcemia   . Hypertension   . Pneumonitis   . Protein calorie malnutrition ([place])   . Subdural hematoma ([place])   . Thyroid disease     No past surgical history on file.  Social History   Socioeconomic History  . Marital status: Married    Spouse name: Not on file  . Number of children: Not on file  . Years of education: Not on file  . Highest education level: Not on file  Occupational History  . Not on file  Tobacco Use  . Smoking status: Never Smoker  . Smokeless tobacco: Never Used  Vaping Use  . Vaping Use: Never used  Substance and Sexual Activity  . Alcohol use: No  . Drug use: No  . Sexual activity: Not Currently  Other Topics Concern  . Not on file  Social History  Narrative   Widow,husband passed away 2020,married for 57 years.Retired.   Social Determinants of Health   Financial Resource Strain: Not on file  Food Insecurity: Not on file  Transportation Needs: Not on file  Physical Activity: Not on file  Stress: Not on file  Social Connections: Not on file  Intimate Partner Violence: Not on file   Family History  Family history unknown: Yes      VITAL SIGNS BP 138/73   Pulse 78   Ht 5\' 3"  (1.6 m)   Wt 146 lb (66.2 kg)   BMI 25.86 kg/m   Outpatient Encounter Medications as of 03/25/2020  Medication Sig  . Amino Acids-Protein Hydrolys (FEEDING SUPPLEMENT, PRO-STAT SUGAR FREE 64,) LIQD Take 30 mLs by mouth in the morning and at bedtime.  Marland Kitchen amLODipine (NORVASC) 5 MG tablet Take 5 mg by mouth daily.  . cetirizine (ZYRTEC) 10 MG tablet Take 10 mg by mouth daily as needed for allergies.  . famotidine (PEPCID) 20 MG tablet Take 20 mg by mouth daily.  . fluticasone (FLONASE) 50 MCG/ACT nasal spray Place 2 sprays into both nostrils daily.  . Fluticasone-Salmeterol (ADVAIR) 100-50 MCG/DOSE AEPB Inhale 1 puff  into the lungs 2 (two) times daily.  Marland Kitchen ipratropium-albuterol (DUONEB) 0.5-2.5 (3) MG/3ML SOLN Take 3 mLs by nebulization every 6 (six) hours as needed.  Marland Kitchen LORazepam (ATIVAN) 2 MG/ML concentrated solution Take 0.5 mg by mouth every 6 (six) hours as needed for anxiety (or agitation).  . magnesium oxide (MAG-OX) 400 MG tablet Take 400 mg by mouth daily.   . NON FORMULARY Diet: _____ Regular, ___x___ NAS, _______Consistent Carbohydrate, _______NPO _____Other  . NP THYROID 90 MG tablet Take 1 tablet (90 mg total) by mouth daily.  Marland Kitchen oxyCODONE (ROXICODONE INTENSOL) 20 MG/ML concentrated solution Take 5 mg by mouth every 6 (six) hours as needed for severe pain.  . OXYGEN Inhale 3 L into the lungs continuous.  Marland Kitchen rOPINIRole (REQUIP) 0.5 MG tablet Take 0.5 mg by mouth at bedtime.  . sodium zirconium cyclosilicate (LOKELMA) 5 g packet Take 5 g by  mouth daily. Special Instructions: for stage V renal failure with hyperkalemia.  . tamsulosin (FLOMAX) 0.4 MG CAPS capsule Take 0.4 mg by mouth every evening.  . traZODone (DESYREL) 50 MG tablet Take 50 mg by mouth at bedtime.  Marland Kitchen trimethoprim (TRIMPEX) 100 MG tablet Take 100 mg by mouth daily. For UTI   No facility-administered encounter medications on file as of 03/25/2020.     SIGNIFICANT DIAGNOSTIC EXAMS  PREVIOUS   11-14-16:  Ct of head:  1. No acute intracranial abnormality identified. 2. 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 3. Moderate for age chronic microvascular ischemic changes and parenchymal volume loss of the brain. 4. Bilateral mastoid effusions. 5. 12 mm nodule left suboccipital scalp, direct visualization recommended, probably sebaceous cyst.  02-05-20: chest x-ray:  1. Chronic interstitial lung disease with mild left basilar atelectasis and/or infiltrate. 2. Small left pleural effusion.  02-06-20: ct of chest:  1. Small bilateral pleural effusions and associated atelectasis or consolidation. 2. Diffuse interlobular septal thickening, consistent with pulmonary edema. 3. Diffuse bilateral bronchial wall thickening, nonspecific and may reflect infectious or inflammatory bronchitis or edema. 4. Bandlike scarring and or atelectasis of the left lower lobe and right middle lobe, most likely sequelae of prior infection. 5. No specific findings to suggest fibrotic interstitial lung disease, although assessment is limited given the presence of edema and effusions. 6. Cardiomegaly and coronary artery disease. 7. Enlargement of the main pulmonary artery, as can be seen in pulmonary hypertension. 8. Enlarged mediastinal lymph nodes, nonspecific and likely reactive.  9. Aortic Atherosclerosis   02-08-20: renal ultrasound:  1. No acute abnormality. No hydronephrosis or bladder distention. 2.  Bilateral simple renal cysts. Bilateral renal cortical thinning. Right kidney is slightly atrophic.  03-04-20: chest x-ray (Dr Darrol Angel report);  Bilateral pleural effusions R>L Increased interstitial markings bilateral upper lobes.  Bilateral pulmonary edema Significant osteoporosis Cardiomegaly   NO NEW EXAMS.   LABS REVIEWED PREVIOUS   02-09-20: wbc 14.3; hgb 8.5; hct 27.5; mcv 102.2 plt 207;  02-10-20: hgb a1c 7.0 02-12-20: glucose 241; bun 88; creat 2.71; k+ 5.0; na++ 138; ca 8.8 phos 5.1 albumin 2.7  02-25-20: wbc 14.3; hgb 8.5; hct 27.5; mcv 102.2 plt 207; glucose 102; bun 44; creat 2.70; k+ 6.4; na++ 140 ca 8.6 liver normal albumin 2.8 tsh 7.031vit D 77.07 02-26-20: glucose 99; bun 37; creat 2.54; k+ 5.8; na++ 141; liver normal albumin 2.7 02-28-20: glucose 120; bun 39; creat 2.66; k+ 6.9; na++ 140; ca 8.6  03-04-20: wbc 6.4; hgb 7.7; hct 26.4;  mcv 107.3 plt 225; glucose 76; bun 30; creat 3.01; k+ 5.4; na++ 144; ca 8.8  03-05-20: glucose 72; bun 30; creat 3.24; k+ 5.4; na++ 144; ca 8.9   TODAY  03-11-20: hgb 8.4; hct 28.1; glucose 79; bun 37; creat 3.66; k+ 6.0; na++ 144 ca 9.1 GFR 11  03-18-20: glucose 92; bun 40; creat 4.51; k+ 5.6; na++ 143; ca 9.1 GFR 9  03-24-20: glucose 83; bun 55; creat 4.07; k+ 5.5; na++ 142; ca 9.3 GFR 10; vit B 12: 318; vit D 75.09; uric acid 8.1  Review of Systems  Constitutional: Negative for malaise/fatigue.  Respiratory: Negative for cough and shortness of breath.   Cardiovascular: Negative for chest pain, palpitations and leg swelling.  Gastrointestinal: Negative for abdominal pain, constipation and heartburn.  Musculoskeletal: Negative for back pain, joint pain and myalgias.  Skin: Negative.   Neurological: Negative for dizziness.  Psychiatric/Behavioral: The patient is not nervous/anxious.     Physical Exam Constitutional:      General: She is not in acute distress.    Appearance: She is well-developed and well-nourished. She is not  diaphoretic.  Neck:     Thyroid: No thyromegaly.  Cardiovascular:     Rate and Rhythm: Normal rate and regular rhythm.     Pulses: Normal pulses and intact distal pulses.     Heart sounds: Murmur heard.    Pulmonary:     Effort: Pulmonary effort is normal. No respiratory distress.     Breath sounds: Normal breath sounds.  Abdominal:     General: Bowel sounds are normal. There is no distension.     Palpations: Abdomen is soft.     Tenderness: There is no abdominal tenderness.  Musculoskeletal:     Cervical back: Neck supple.     Right lower leg: No edema.     Left lower leg: No edema.     Comments:  Able to move extremities Kyphosis          Lymphadenopathy:     Cervical: No cervical adenopathy.  Skin:    General: Skin is warm and dry.  Neurological:     Mental Status: She is alert. Mental status is at baseline.  Psychiatric:        Mood and Affect: Mood and affect and mood normal.       ASSESSMENT/ PLAN:  TODAY  1. Hyperkalemia 2. 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 3. Elevated uric acid level   Is without change:  Will increase lokelma to 15 gm daily  Will begin allopurinol 100 mg daily  Will check BMP 04-01-20 Will monitor her status.        MD is aware of resident's narcotic use and is in agreement with current plan of care. We will attempt to wean resident as appropriate.  Ok Edwards NP [facility]  Contact [phone] Monday through Friday 8am- 5pm  After hours call [phone]

## 2020-04-03 LAB — HEPATITIS C GENOTYPE

## 2020-04-04 ENCOUNTER — Non-Acute Institutional Stay (SKILLED_NURSING_FACILITY): Payer: Medicare Other | Admitting: Adult Health

## 2020-04-04 ENCOUNTER — Encounter: Payer: Self-pay | Admitting: Adult Health

## 2020-04-04 DIAGNOSIS — J849 Interstitial pulmonary disease, unspecified: Secondary | ICD-10-CM

## 2020-04-04 DIAGNOSIS — E039 Hypothyroidism, unspecified: Secondary | ICD-10-CM | POA: Diagnosis not present

## 2020-04-04 DIAGNOSIS — Z8673 Personal history of transient ischemic attack (TIA), and cerebral infarction without residual deficits: Secondary | ICD-10-CM

## 2020-04-04 NOTE — Progress Notes (Signed)
Location:  [facility] Room Number: 151/P Place of Service:  SNF (31)   CODE STATUS: DNR  Allergies  Allergen Reactions  . Duloxetine Hcl Other (See Comments)    unknown unknown  . Morphine And Related Other (See Comments)    Passed out  Passed out   . Other     adhesive  . Tape     adhesive  . Venlafaxine Other (See Comments)    unknown unknown    Chief Complaint  Patient presents with  . 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 There are no reports of uncontrolled pain; no reports of shortness of breath; no cough is off her 02.   Past Medical History:  Diagnosis Date  . Anemia   . Arthritis   . Cerebral infarction ([place])   . Coronary artery disease   . Dementia ([place])   . Diabetes mellitus without complication (HCC)    Type 2  . Dysphagia due to recent cerebral infarction   . Failure to thrive (0-17)   . Hypercalcemia   . Hypertension   . Pneumonitis   . Protein calorie malnutrition ([place])   . Subdural hematoma ([place])   . Thyroid disease     History reviewed. No pertinent surgical history.  Social History   Socioeconomic History  . Marital status: Married    Spouse name: Not on file  . Number of children: Not on file  . Years of education: Not on file  . Highest education level: Not on file  Occupational History  . Not on file  Tobacco Use  . Smoking status: Never Smoker  . Smokeless tobacco: Never Used  Vaping Use  . Vaping Use: Never used  Substance and Sexual Activity  . Alcohol use: No  . Drug use: No  . Sexual activity: Not Currently  Other Topics Concern  . Not on file  Social History Narrative    Widow,husband passed away 2020,married for 57 years.Retired.   Social Determinants of Health   Financial Resource Strain: Not on file  Food Insecurity: Not on file  Transportation Needs: Not on file  Physical Activity: Not on file  Stress: Not on file  Social Connections: Not on file  Intimate Partner Violence: Not on file   Family History  Family history unknown: Yes      VITAL SIGNS BP (!) 145/56   Pulse 61   Temp 98.4 F (36.9 C)   Resp (!) 21   Ht 5\' 3"  (1.6 m)   Wt 149 lb 3.2 oz (67.7 kg)   SpO2 96%   BMI 26.43 kg/m   Outpatient Encounter Medications as of 04/04/2020  Medication Sig  . allopurinol (ZYLOPRIM) 100 MG tablet Take 100 mg by mouth daily.  . Amino Acids-Protein Hydrolys (FEEDING SUPPLEMENT, PRO-STAT SUGAR FREE 64,) LIQD Take 30 mLs by mouth 3 (three) times daily with meals.  Marland Kitchen amLODipine (NORVASC) 5 MG tablet Take 5 mg by mouth daily.  . cetirizine (ZYRTEC) 10 MG tablet Take 10 mg by mouth daily as needed for allergies.  . famotidine (PEPCID) 20 MG tablet  Take 20 mg by mouth daily.  . fluticasone (FLONASE) 50 MCG/ACT nasal spray Place 2 sprays into both nostrils daily as needed.  . Fluticasone-Salmeterol (ADVAIR) 100-50 MCG/DOSE AEPB Inhale 1 puff into the lungs 2 (two) times daily.  . furosemide (LASIX) 40 MG tablet Take 40 mg by mouth daily.  Marland Kitchen ipratropium-albuterol (DUONEB) 0.5-2.5 (3) MG/3ML SOLN Take 3 mLs by nebulization every 6 (six) hours as needed.  . magnesium oxide (MAG-OX) 400 MG tablet Take 400 mg by mouth daily.   . NON FORMULARY Diet Change: Regular (NAS), thin liquids  . NP THYROID 90 MG tablet Take 1 tablet (90 mg total) by mouth daily.  Marland Kitchen oxyCODONE (ROXICODONE INTENSOL) 20 MG/ML concentrated solution Take 5 mg by mouth every 6 (six) hours as needed for severe pain.  Marland Kitchen rOPINIRole (REQUIP) 0.5 MG tablet Take 0.5 mg by mouth at bedtime.  . sodium zirconium cyclosilicate (LOKELMA) 5 g packet Take 10 g by mouth daily. Special Instructions: for  stage V renal failure with hyperkalemia.  . tamsulosin (FLOMAX) 0.4 MG CAPS capsule Take 0.4 mg by mouth every evening.  . traZODone (DESYREL) 50 MG tablet Take 50 mg by mouth at bedtime.  Marland Kitchen trimethoprim (TRIMPEX) 100 MG tablet Take 100 mg by mouth daily. For UTI  . [DISCONTINUED] LORazepam (ATIVAN) 2 MG/ML concentrated solution Take 0.5 mg by mouth every 6 (six) hours as needed for anxiety (or agitation).  . [DISCONTINUED] OXYGEN Inhale 3 L into the lungs continuous.   No facility-administered encounter medications on file as of 04/04/2020.     SIGNIFICANT DIAGNOSTIC EXAMS   PREVIOUS   11-14-16:  Ct of head:  1. No acute intracranial abnormality identified. 2. 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 3. Moderate for age chronic microvascular ischemic changes and parenchymal volume loss of the brain. 4. Bilateral mastoid effusions. 5. 12 mm nodule left suboccipital scalp, direct visualization recommended, probably sebaceous cyst.  02-05-20: chest x-ray:  1. Chronic interstitial lung disease with mild left basilar atelectasis and/or infiltrate. 2. Small left pleural effusion.  02-06-20: ct of chest:  1. Small bilateral pleural effusions and associated atelectasis or consolidation. 2. Diffuse interlobular septal thickening, consistent with pulmonary edema. 3. Diffuse bilateral bronchial wall thickening, nonspecific and may reflect infectious or inflammatory bronchitis or edema. 4. Bandlike scarring and or atelectasis of the left lower lobe and right middle lobe, most likely sequelae of prior infection. 5. No specific findings to suggest fibrotic interstitial lung disease, although assessment is limited given the presence of edema and effusions. 6. Cardiomegaly and coronary artery disease. 7. Enlargement of the main pulmonary artery, as can be seen in pulmonary hypertension. 8. Enlarged mediastinal lymph  nodes, nonspecific and likely reactive.  9. Aortic Atherosclerosis   02-08-20: renal ultrasound:  1. No acute abnormality. No hydronephrosis or bladder distention. 2. Bilateral simple renal cysts. Bilateral renal cortical thinning. Right kidney is slightly atrophic.  03-04-20: chest x-ray (Dr Darrol Angel report);  Bilateral pleural effusions R>L Increased interstitial markings bilateral upper lobes.  Bilateral pulmonary edema Significant osteoporosis Cardiomegaly   NO NEW EXAMS.   LABS REVIEWED PREVIOUS   02-09-20: wbc 14.3; hgb 8.5; hct 27.5; mcv 102.2 plt 207;  02-10-20: hgb a1c 7.0 02-12-20: glucose 241; bun 88; creat 2.71; k+ 5.0; na++ 138; ca 8.8 phos 5.1 albumin 2.7  02-25-20: wbc 14.3; hgb 8.5; hct 27.5; mcv 102.2 plt 207; glucose 102; bun 44; creat 2.70; k+ 6.4; na++ 140  ca 8.6 liver normal albumin 2.8 tsh 7.031vit D 77.07 02-26-20: glucose 99; bun 37; creat 2.54; k+ 5.8; na++ 141; liver normal albumin 2.7 02-28-20: glucose 120; bun 39; creat 2.66; k+ 6.9; na++ 140; ca 8.6  03-04-20: wbc 6.4; hgb 7.7; hct 26.4; mcv 107.3 plt 225; glucose 76; bun 30; creat 3.01; k+ 5.4; na++ 144; ca 8.8  03-05-20: glucose 72; bun 30; creat 3.24; k+ 5.4; na++ 144; ca 8.9  03-11-20: hgb 8.4; hct 28.1; glucose 79; bun 37; creat 3.66; k+ 6.0; na++ 144 ca 9.1 GFR 11  03-18-20: glucose 92; bun 40; creat 4.51; k+ 5.6; na++ 143; ca 9.1 GFR 9  03-24-20: glucose 83; bun 55; creat 4.07; k+ 5.5; na++ 142; ca 9.3 GFR 10; vit B 12: 318; vit D 75.09; uric acid 8.1  NO NEW LABS.   Review of Systems  Constitutional: Negative for malaise/fatigue.  Respiratory: Negative for cough.   Cardiovascular: Negative for chest pain.  Gastrointestinal: Negative for abdominal pain.  Musculoskeletal: Negative for back pain, joint pain and myalgias.  Skin: Negative.   Psychiatric/Behavioral: The patient is not nervous/anxious.    .   Physical Exam Constitutional:      General: She is not in acute distress.    Appearance:  She is well-developed and well-nourished. She is not diaphoretic.  Neck:     Thyroid: No thyromegaly.  Cardiovascular:     Rate and Rhythm: Normal rate and regular rhythm.     Pulses: Normal pulses and intact distal pulses.     Heart sounds: Murmur heard.    Pulmonary:     Effort: Pulmonary effort is normal. No respiratory distress.     Breath sounds: Normal breath sounds.  Abdominal:     General: Bowel sounds are normal. There is no distension.     Palpations: Abdomen is soft.     Tenderness: There is no abdominal tenderness.  Musculoskeletal:        General: No edema.     Cervical back: Neck supple.     Right lower leg: No edema.     Left lower leg: No edema.     Comments: Able to move extremities Kyphosis           Lymphadenopathy:     Cervical: No cervical adenopathy.  Skin:    General: Skin is warm and dry.  Neurological:     Mental Status: She is alert. Mental status is at baseline.  Psychiatric:        Mood and Affect: Mood and affect and mood normal.      ASSESSMENT/ PLAN:   TODAY  1. History of cva in adult hood: is stable will monitor  2. Acquired hypothyroidism: is stable will continue NP thyroid 90 mg daily   3. Chronic interstitial lung disease: is stable is no longer on 02; will continue advair 150/50 mcg twice daily has oxyfast 5 mg every 6 hours as needed has duoneb every 6 hour as needed.   PREVIOUS    4. Chronic constipation: is stable will continue benefiber daily  5 Vascular dementia without behavioral disturbance will monitor   6. Lower urinary tract symptoms (LUTS)/ chronic UTI  is stable will continue flomax 0.4 mg daily trimpex 100 mg daily   7. Chronic non-seasonal allergic rhinitis is stable will continue flonase daily as needed zyrtec 10 mg daily as needed   8. Chronic insomnia: is stable will continue trazodone 50 mg nightly   9. GERD without esophagitis: is stable will  continue pepcid 20 mg daily   11. CKD stage 4 due to  diabetes mellitus/hyperkalemia: is without change bun 30; creat 3.24; k+ 5.4   12. Type 2 diabetes mellitus with stage 4 chronic kidney disease and hypertension: is stable hgb a1c 7.9 will monitor   13. Hypertension associated with stage 4 chronic kidney disease due to type 2 diabetes mellitus: is stable b/p 144/41  will continue asa 81 mg daily norvasc 10 mg daily   14. Hyperlipidemia associated with type 2 diabetes mellitus: is stable is off lipitor  15. Aortic atherosclerosis: is stable will monitor   16. Coronary artery disease involving native coronary artery of native heart without angina: is off medications will monitor   MD is aware of resident's narcotic use and is in agreement with current plan of care. We will attempt to wean resident as appropriate.  Ok Edwards NP [facility]  Contact [phone] Monday through Friday 8am- 5pm  After hours call [phone]

## 2020-04-08 ENCOUNTER — Ambulatory Visit (INDEPENDENT_AMBULATORY_CARE_PROVIDER_SITE_OTHER): Payer: Medicare Other | Admitting: Internal Medicine

## 2020-04-09 ENCOUNTER — Other Ambulatory Visit (HOSPITAL_COMMUNITY)
Admission: RE | Admit: 2020-04-09 | Discharge: 2020-04-09 | Disposition: A | Discharge status: Home / Self Care | Payer: Medicare Other | Source: Skilled Nursing Facility | Attending: Internal Medicine | Admitting: Internal Medicine

## 2020-04-09 DIAGNOSIS — N184 Chronic kidney disease, stage 4 (severe): Secondary | ICD-10-CM | POA: Diagnosis present

## 2020-04-09 DIAGNOSIS — D631 Anemia in chronic kidney disease: Secondary | ICD-10-CM | POA: Insufficient documentation

## 2020-04-09 LAB — BASIC METABOLIC PANEL
Anion gap: 6 (ref 5–15)
BUN: 74 mg/dL — ABNORMAL HIGH (ref 8–23)
CO2: 24 mmol/L (ref 22–32)
Calcium: 9.5 mg/dL (ref 8.9–10.3)
Chloride: 107 mmol/L (ref 98–111)
Creatinine, Ser: 4.1 mg/dL — ABNORMAL HIGH (ref 0.44–1.00)
GFR, Estimated: 10 mL/min — ABNORMAL LOW (ref >60)
Glucose, Bld: 120 mg/dL — ABNORMAL HIGH (ref 70–99)
Potassium: 5.5 mmol/L — ABNORMAL HIGH (ref 3.5–5.1)
Sodium: 137 mmol/L (ref 135–145)

## 2020-04-11 ENCOUNTER — Encounter (HOSPITAL_COMMUNITY)
Admission: RE | Admit: 2020-04-11 | Discharge: 2020-04-11 | Disposition: A | Discharge status: Home / Self Care | Payer: Medicare Other | Source: Skilled Nursing Facility | Attending: Nephrology | Admitting: Nephrology

## 2020-04-11 ENCOUNTER — Non-Acute Institutional Stay: Payer: Self-pay | Admitting: *Deleted

## 2020-04-11 ENCOUNTER — Non-Acute Institutional Stay (SKILLED_NURSING_FACILITY): Payer: Medicare Other | Admitting: Adult Health

## 2020-04-11 ENCOUNTER — Encounter (HOSPITAL_COMMUNITY): Payer: Medicare Other | Attending: Nephrology

## 2020-04-11 DIAGNOSIS — E1122 Type 2 diabetes mellitus with diabetic chronic kidney disease: Secondary | ICD-10-CM

## 2020-04-11 DIAGNOSIS — F0151 Vascular dementia with behavioral disturbance: Secondary | ICD-10-CM | POA: Diagnosis not present

## 2020-04-11 DIAGNOSIS — N185 Chronic kidney disease, stage 5: Secondary | ICD-10-CM | POA: Diagnosis not present

## 2020-04-11 DIAGNOSIS — F01518 Vascular dementia, unspecified severity, with other behavioral disturbance: Secondary | ICD-10-CM

## 2020-04-11 LAB — POCT HEMOGLOBIN-HEMACUE: Hemoglobin: 9.2 g/dL — ABNORMAL LOW (ref 12.0–15.0)

## 2020-04-11 MED ORDER — EPOETIN ALFA 3000 UNIT/ML IJ SOLN
INTRAMUSCULAR | Status: AC
  Filled 2020-04-11: qty 1

## 2020-04-11 MED ORDER — EPOETIN ALFA 3000 UNIT/ML IJ SOLN
3000.0000 [IU] | Freq: Once | INTRAMUSCULAR | Status: AC
  Administered 2020-04-11: 3000 [IU] via SUBCUTANEOUS

## 2020-04-13 NOTE — Progress Notes (Signed)
Location:  [place] Room Number: 606 Place of Service:  SNF (31)   CODE STATUS: dnr  Allergies  Allergen Reactions  . Duloxetine Hcl Other (See Comments)    unknown unknown  . Morphine And Related Other (See Comments)    Passed out  Passed out   . Other     adhesive  . Tape     adhesive  . Venlafaxine Other (See Comments)    unknown unknown    Chief Complaint  Patient presents with  . Acute Visit    Concerns     HPI:  The nursing staff is concerned about her status today. She has had increased yelling out for no apparent reason. She appears to be in pain; but cannot tell where she hurts. She is having increased difficulty with holding her glass has a poor grip. Her po intake is poor as well .   Past Medical History:  Diagnosis Date  . Anemia   . Arthritis   . Cerebral infarction ([place])   . Coronary artery disease   . Dementia ([place])   . Diabetes mellitus without complication (HCC)    Type 2  . Dysphagia due to recent cerebral infarction   . Failure to thrive (0-17)   . Hypercalcemia   . Hypertension   . Pneumonitis   . Protein calorie malnutrition ([place])   . Subdural hematoma ([place])   . Thyroid disease     No past surgical history on file.  Social History   Socioeconomic History  . Marital status: Married    Spouse name: Not on file  . Number of children: Not on file  . Years of education: Not on file  . Highest education level: Not on file  Occupational History  . Not on file  Tobacco Use  . Smoking status: Never Smoker  . Smokeless tobacco: Never Used  Vaping Use  . Vaping Use: Never used  Substance and Sexual Activity  . Alcohol use: No  . Drug use: No  . Sexual activity: Not Currently  Other Topics Concern  . Not on file  Social History Narrative   Widow,husband passed away 2020,married for 57 years.Retired.   Social Determinants of Health   Financial Resource Strain: Not on file  Food Insecurity: Not on  file  Transportation Needs: Not on file  Physical Activity: Not on file  Stress: Not on file  Social Connections: Not on file  Intimate Partner Violence: Not on file   Family History  Family history unknown: Yes      VITAL SIGNS BP 137/70   Pulse 68   Temp 97.6 F (36.4 C)   Ht 5\' 3"  (1.6 m)   Wt 149 lb (67.6 kg)   BMI 26.39 kg/m   Outpatient Encounter Medications as of 04/11/2020  Medication Sig  . allopurinol (ZYLOPRIM) 100 MG tablet Take 100 mg by mouth daily.  . Amino Acids-Protein Hydrolys (FEEDING SUPPLEMENT, PRO-STAT SUGAR FREE 64,) LIQD Take 30 mLs by mouth 3 (three) times daily with meals.  Marland Kitchen amLODipine (NORVASC) 5 MG tablet Take 5 mg by mouth daily.  . cetirizine (ZYRTEC) 10 MG tablet Take 10 mg by mouth daily as needed for allergies.  Marland Kitchen epoetin alfa (EPOGEN) 3000 UNIT/ML injection Inject 3,000 Units into the vein every 14 (fourteen) days.  . famotidine (PEPCID) 20 MG tablet Take 20 mg by mouth daily.  . fluticasone (FLONASE) 50 MCG/ACT nasal spray Place 2 sprays into both nostrils daily as  needed.  . Fluticasone-Salmeterol (ADVAIR) 100-50 MCG/DOSE AEPB Inhale 1 puff into the lungs 2 (two) times daily.  . furosemide (LASIX) 40 MG tablet Take 40 mg by mouth daily.  Marland Kitchen ipratropium-albuterol (DUONEB) 0.5-2.5 (3) MG/3ML SOLN Take 3 mLs by nebulization every 6 (six) hours as needed.  . magnesium oxide (MAG-OX) 400 MG tablet Take 400 mg by mouth daily.   . NON FORMULARY Diet Change: Regular (NAS), thin liquids  . NP THYROID 90 MG tablet Take 1 tablet (90 mg total) by mouth daily.  Marland Kitchen oxyCODONE (ROXICODONE INTENSOL) 20 MG/ML concentrated solution Take 5 mg by mouth every 6 (six) hours as needed for severe pain.  Marland Kitchen rOPINIRole (REQUIP) 0.5 MG tablet Take 0.5 mg by mouth at bedtime.  . sodium zirconium cyclosilicate (LOKELMA) 5 g packet Take 10 g by mouth daily. Special Instructions: for stage V renal failure with hyperkalemia.  . tamsulosin (FLOMAX) 0.4 MG CAPS capsule Take 0.4  mg by mouth every evening.  . traZODone (DESYREL) 50 MG tablet Take 50 mg by mouth at bedtime.  Marland Kitchen trimethoprim (TRIMPEX) 100 MG tablet Take 100 mg by mouth daily. For UTI   No facility-administered encounter medications on file as of 04/11/2020.     SIGNIFICANT DIAGNOSTIC EXAMS  PREVIOUS   11-14-16:  Ct of head:  1. No acute intracranial abnormality identified. 2. 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 3. Moderate for age chronic microvascular ischemic changes and parenchymal volume loss of the brain. 4. Bilateral mastoid effusions. 5. 12 mm nodule left suboccipital scalp, direct visualization recommended, probably sebaceous cyst.  02-05-20: chest x-ray:  1. Chronic interstitial lung disease with mild left basilar atelectasis and/or infiltrate. 2. Small left pleural effusion.  02-06-20: ct of chest:  1. Small bilateral pleural effusions and associated atelectasis or consolidation. 2. Diffuse interlobular septal thickening, consistent with pulmonary edema. 3. Diffuse bilateral bronchial wall thickening, nonspecific and may reflect infectious or inflammatory bronchitis or edema. 4. Bandlike scarring and or atelectasis of the left lower lobe and right middle lobe, most likely sequelae of prior infection. 5. No specific findings to suggest fibrotic interstitial lung disease, although assessment is limited given the presence of edema and effusions. 6. Cardiomegaly and coronary artery disease. 7. Enlargement of the main pulmonary artery, as can be seen in pulmonary hypertension. 8. Enlarged mediastinal lymph nodes, nonspecific and likely reactive.  9. Aortic Atherosclerosis   02-08-20: renal ultrasound:  1. No acute abnormality. No hydronephrosis or bladder distention. 2. Bilateral simple renal cysts. Bilateral renal cortical thinning. Right kidney is slightly atrophic.  03-04-20: chest x-ray (Dr Darrol Angel  report);  Bilateral pleural effusions R>L Increased interstitial markings bilateral upper lobes.  Bilateral pulmonary edema Significant osteoporosis Cardiomegaly   NO NEW EXAMS.   LABS REVIEWED PREVIOUS   02-09-20: wbc 14.3; hgb 8.5; hct 27.5; mcv 102.2 plt 207;  02-10-20: hgb a1c 7.0 02-12-20: glucose 241; bun 88; creat 2.71; k+ 5.0; na++ 138; ca 8.8 phos 5.1 albumin 2.7  02-25-20: wbc 14.3; hgb 8.5; hct 27.5; mcv 102.2 plt 207; glucose 102; bun 44; creat 2.70; k+ 6.4; na++ 140 ca 8.6 liver normal albumin 2.8 tsh 7.031vit D 77.07 02-26-20: glucose 99; bun 37; creat 2.54; k+ 5.8; na++ 141; liver normal albumin 2.7 02-28-20: glucose 120; bun 39; creat 2.66; k+ 6.9; na++ 140; ca 8.6  03-04-20: wbc 6.4; hgb 7.7; hct 26.4; mcv 107.3 plt 225; glucose 76; bun 30; creat 3.01; k+ 5.4;  na++ 144; ca 8.8  03-05-20: glucose 72; bun 30; creat 3.24; k+ 5.4; na++ 144; ca 8.9  03-11-20: hgb 8.4; hct 28.1; glucose 79; bun 37; creat 3.66; k+ 6.0; na++ 144 ca 9.1 GFR 11  03-18-20: glucose 92; bun 40; creat 4.51; k+ 5.6; na++ 143; ca 9.1 GFR 9  03-24-20: glucose 83; bun 55; creat 4.07; k+ 5.5; na++ 142; ca 9.3 GFR 10; vit B 12: 318; vit D 75.09; uric acid 8.1   TODAY    04-09-20 glucose 120; bun 74; creat 4.10 k+ 5.5; na++ 137; ca 9.5 GFR 10  Review of Systems  Constitutional: Negative for malaise/fatigue.  Respiratory: Negative for cough.   Cardiovascular: Negative for chest pain.  Gastrointestinal: Negative for abdominal pain.  Musculoskeletal: Negative for myalgias.  Skin: Negative.   Psychiatric/Behavioral: The patient is nervous/anxious.     Physical Exam Constitutional:      General: She is not in acute distress.    Appearance: She is well-developed and well-nourished. She is not diaphoretic.  Neck:     Thyroid: No thyromegaly.  Cardiovascular:     Rate and Rhythm: Normal rate and regular rhythm.     Pulses: Normal pulses and intact distal pulses.     Heart sounds: Murmur heard.     Pulmonary:     Effort: Pulmonary effort is normal. No respiratory distress.     Breath sounds: Normal breath sounds.  Abdominal:     General: Bowel sounds are normal. There is no distension.     Palpations: Abdomen is soft.     Tenderness: There is no abdominal tenderness.  Musculoskeletal:        General: No edema.     Cervical back: Neck supple.     Right lower leg: No edema.     Left lower leg: No edema.     Comments: Able to move extremities Kyphosis           Lymphadenopathy:     Cervical: No cervical adenopathy.  Skin:    General: Skin is warm and dry.  Neurological:     Mental Status: She is alert. Mental status is at baseline.  Psychiatric:        Mood and Affect: Mood and affect and mood normal.    ASSESSMENT AND PLAN    TODAY  1. Vascular dementia with behavior disturbance 2. CKD stage 5 due to type 2 diabetes mellitus  Will continue her oxyfast and will restart her ativan liquid to help with her anxiety and pain due to her dementia and renal disease.   MD is aware of resident's narcotic use and is in agreement with current plan of care. We will attempt to wean resident as appropriate.  Ok Edwards NP [facility]  Contact [phone] Monday through Friday 8am- 5pm  After hours call [phone]

## 2020-04-14 DIAGNOSIS — F0151 Vascular dementia with behavioral disturbance: Secondary | ICD-10-CM | POA: Insufficient documentation

## 2020-04-14 DIAGNOSIS — F01518 Vascular dementia, unspecified severity, with other behavioral disturbance: Secondary | ICD-10-CM | POA: Insufficient documentation

## 2020-04-14 MED FILL — Epoetin Alfa Inj 3000 Unit/ML: INTRAMUSCULAR | Qty: 1 | Status: AC

## 2020-04-15 ENCOUNTER — Ambulatory Visit (HOSPITAL_COMMUNITY)
Admission: RE | Admit: 2020-04-15 | Discharge: 2020-04-15 | Disposition: A | Discharge status: Home / Self Care | Payer: Medicare Other | Source: Ambulatory Visit | Attending: Adult Health | Admitting: Adult Health

## 2020-04-15 ENCOUNTER — Encounter: Payer: Self-pay | Admitting: Adult Health

## 2020-04-15 ENCOUNTER — Non-Acute Institutional Stay (SKILLED_NURSING_FACILITY): Payer: Medicare Other | Admitting: Adult Health

## 2020-04-15 DIAGNOSIS — F0150 Vascular dementia without behavioral disturbance: Secondary | ICD-10-CM

## 2020-04-15 DIAGNOSIS — R2241 Localized swelling, mass and lump, right lower limb: Secondary | ICD-10-CM | POA: Diagnosis present

## 2020-04-15 DIAGNOSIS — J849 Interstitial pulmonary disease, unspecified: Secondary | ICD-10-CM | POA: Diagnosis not present

## 2020-04-15 DIAGNOSIS — R627 Adult failure to thrive: Secondary | ICD-10-CM

## 2020-04-15 DIAGNOSIS — E1122 Type 2 diabetes mellitus with diabetic chronic kidney disease: Secondary | ICD-10-CM | POA: Diagnosis not present

## 2020-04-15 DIAGNOSIS — N185 Chronic kidney disease, stage 5: Secondary | ICD-10-CM

## 2020-04-15 DIAGNOSIS — I501 Left ventricular failure: Secondary | ICD-10-CM | POA: Diagnosis not present

## 2020-04-15 NOTE — Progress Notes (Signed)
.  Location:  [place] Room Number: 151-P Place of Service:  SNF (31)   CODE STATUS: DNR  Allergies  Allergen Reactions  . Duloxetine Hcl Other (See Comments)    unknown unknown  . Morphine And Related Other (See Comments)    Passed out  Passed out   . Other     adhesive  . Tape     adhesive  . Venlafaxine Other (See Comments)    unknown unknown    Chief Complaint  Patient presents with  . Acute Visit    Change in status     HPI:  She has had a decline in her status; her po intake is very poor. She is wheelchair bound at this time. She has been restless; yelling out. There are no reports of fevers present. She has failure to thrive and end stage renal disease dementia and congestive heart failure. Her ativan and roxanol have been restarted to help with pain and anxiety.   Past Medical History:  Diagnosis Date  . Anemia   . Arthritis   . Cerebral infarction ([place])   . Coronary artery disease   . Dementia ([place])   . Diabetes mellitus without complication (HCC)    Type 2  . Dysphagia due to recent cerebral infarction   . Failure to thrive (0-17)   . Hypercalcemia   . Hypertension   . Pneumonitis   . Protein calorie malnutrition ([place])   . Subdural hematoma ([place])   . Thyroid disease     History reviewed. No pertinent surgical history.  Social History   Socioeconomic History  . Marital status: Married    Spouse name: Not on file  . Number of children: Not on file  . Years of education: Not on file  . Highest education level: Not on file  Occupational History  . Not on file  Tobacco Use  . Smoking status: Never Smoker  . Smokeless tobacco: Never Used  Vaping Use  . Vaping Use: Never used  Substance and Sexual Activity  . Alcohol use: No  . Drug use: No  . Sexual activity: Not Currently  Other Topics Concern  . Not on file  Social History Narrative   Widow,husband passed away 2020,married for 57 years.Retired.   Social  Determinants of Health   Financial Resource Strain: Not on file  Food Insecurity: Not on file  Transportation Needs: Not on file  Physical Activity: Not on file  Stress: Not on file  Social Connections: Not on file  Intimate Partner Violence: Not on file   Family History  Family history unknown: Yes      VITAL SIGNS BP (!) 159/45   Pulse 65   Temp 97.9 F (36.6 C)   Resp (!) 21   Ht 5\' 3"  (1.6 m)   Wt 149 lb 3.2 oz (67.7 kg)   SpO2 92%   BMI 26.43 kg/m   Outpatient Encounter Medications as of 04/15/2020  Medication Sig  . allopurinol (ZYLOPRIM) 100 MG tablet Take 100 mg by mouth daily.  . Amino Acids-Protein Hydrolys (FEEDING SUPPLEMENT, PRO-STAT SUGAR FREE 64,) LIQD Take 30 mLs by mouth 3 (three) times daily with meals.  Marland Kitchen amLODipine (NORVASC) 5 MG tablet Take 5 mg by mouth daily.  . cetirizine (ZYRTEC) 10 MG tablet Take 10 mg by mouth daily as needed for allergies.  . famotidine (PEPCID) 20 MG tablet Take 20 mg by mouth daily.  . fluticasone (FLONASE) 50 MCG/ACT nasal spray Place  2 sprays into both nostrils daily as needed.  . Fluticasone-Salmeterol (ADVAIR) 100-50 MCG/DOSE AEPB Inhale 1 puff into the lungs 2 (two) times daily.  . furosemide (LASIX) 40 MG tablet Take 40 mg by mouth daily.  Marland Kitchen ipratropium-albuterol (DUONEB) 0.5-2.5 (3) MG/3ML SOLN Take 3 mLs by nebulization every 6 (six) hours as needed.  Marland Kitchen LORazepam (ATIVAN) 2 MG tablet Take 0.5 mg by mouth every 6 (six) hours as needed for anxiety.  . magnesium oxide (MAG-OX) 400 MG tablet Take 400 mg by mouth daily.   . NON FORMULARY Diet Change: Regular (NAS), thin liquids  . NP THYROID 90 MG tablet Take 1 tablet (90 mg total) by mouth daily.  Marland Kitchen oxyCODONE (ROXICODONE INTENSOL) 20 MG/ML concentrated solution Take 5 mg by mouth every 6 (six) hours as needed for severe pain.  . OXYGEN Inhale 2 L into the lungs.  Marland Kitchen rOPINIRole (REQUIP) 0.5 MG tablet Take 0.5 mg by mouth at bedtime.  . sodium zirconium cyclosilicate  (LOKELMA) 10 g PACK packet Take 10 g by mouth daily.  . tamsulosin (FLOMAX) 0.4 MG CAPS capsule Take 0.4 mg by mouth every evening.  . traZODone (DESYREL) 50 MG tablet Take 50 mg by mouth at bedtime.  Marland Kitchen trimethoprim (TRIMPEX) 100 MG tablet Take 100 mg by mouth daily. For UTI  . epoetin alfa (EPOGEN) 3000 UNIT/ML injection Inject 3,000 Units into the vein every 14 (fourteen) days.  . [DISCONTINUED] sodium zirconium cyclosilicate (LOKELMA) 5 g packet Take 10 g by mouth daily. Special Instructions: for stage V renal failure with hyperkalemia. (Patient not taking: Reported on 04/15/2020)   No facility-administered encounter medications on file as of 04/15/2020.     SIGNIFICANT DIAGNOSTIC EXAMS   PREVIOUS   11-14-16:  Ct of head:  1. No acute intracranial abnormality identified. 2. 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 3. Moderate for age chronic microvascular ischemic changes and parenchymal volume loss of the brain. 4. Bilateral mastoid effusions. 5. 12 mm nodule left suboccipital scalp, direct visualization recommended, probably sebaceous cyst.  02-05-20: chest x-ray:  1. Chronic interstitial lung disease with mild left basilar atelectasis and/or infiltrate. 2. Small left pleural effusion.  02-06-20: ct of chest:  1. Small bilateral pleural effusions and associated atelectasis or consolidation. 2. Diffuse interlobular septal thickening, consistent with pulmonary edema. 3. Diffuse bilateral bronchial wall thickening, nonspecific and may reflect infectious or inflammatory bronchitis or edema. 4. Bandlike scarring and or atelectasis of the left lower lobe and right middle lobe, most likely sequelae of prior infection. 5. No specific findings to suggest fibrotic interstitial lung disease, although assessment is limited given the presence of edema and effusions. 6. Cardiomegaly and coronary artery disease. 7.  Enlargement of the main pulmonary artery, as can be seen in pulmonary hypertension. 8. Enlarged mediastinal lymph nodes, nonspecific and likely reactive.  9. Aortic Atherosclerosis   02-08-20: renal ultrasound:  1. No acute abnormality. No hydronephrosis or bladder distention. 2. Bilateral simple renal cysts. Bilateral renal cortical thinning. Right kidney is slightly atrophic.  03-04-20: chest x-ray (Dr Darrol Angel report);  Bilateral pleural effusions R>L Increased interstitial markings bilateral upper lobes.  Bilateral pulmonary edema Significant osteoporosis Cardiomegaly   NO NEW EXAMS.   LABS REVIEWED PREVIOUS   02-09-20: wbc 14.3; hgb 8.5; hct 27.5; mcv 102.2 plt 207;  02-10-20: hgb a1c 7.0 02-12-20: glucose 241; bun 88; creat 2.71; k+ 5.0; na++ 138; ca 8.8 phos 5.1 albumin 2.7  02-25-20:  wbc 14.3; hgb 8.5; hct 27.5; mcv 102.2 plt 207; glucose 102; bun 44; creat 2.70; k+ 6.4; na++ 140 ca 8.6 liver normal albumin 2.8 tsh 7.031vit D 77.07 02-26-20: glucose 99; bun 37; creat 2.54; k+ 5.8; na++ 141; liver normal albumin 2.7 02-28-20: glucose 120; bun 39; creat 2.66; k+ 6.9; na++ 140; ca 8.6  03-04-20: wbc 6.4; hgb 7.7; hct 26.4; mcv 107.3 plt 225; glucose 76; bun 30; creat 3.01; k+ 5.4; na++ 144; ca 8.8  03-05-20: glucose 72; bun 30; creat 3.24; k+ 5.4; na++ 144; ca 8.9  03-11-20: hgb 8.4; hct 28.1; glucose 79; bun 37; creat 3.66; k+ 6.0; na++ 144 ca 9.1 GFR 11  03-18-20: glucose 92; bun 40; creat 4.51; k+ 5.6; na++ 143; ca 9.1 GFR 9  03-24-20: glucose 83; bun 55; creat 4.07; k+ 5.5; na++ 142; ca 9.3 GFR 10; vit B 12: 318; vit D 75.09; uric acid 8.1 04-09-20 glucose 120; bun 74; creat 4.10 k+ 5.5; na++ 137; ca 9.5 GFR 10  NO NEW LABS.   Review of Systems  Unable to perform ROS: Medical condition    Physical Exam Constitutional:      General: She is not in acute distress.    Appearance: She is well-developed and well-nourished. She is not diaphoretic.  Neck:     Thyroid: No  thyromegaly.  Cardiovascular:     Rate and Rhythm: Bradycardia present. Rhythm irregular.     Pulses: Normal pulses and intact distal pulses.     Heart sounds: Murmur heard.    Pulmonary:     Effort: Pulmonary effort is normal. No respiratory distress.     Breath sounds: Normal breath sounds.  Abdominal:     General: Bowel sounds are normal. There is no distension.     Palpations: Abdomen is soft.     Tenderness: There is no abdominal tenderness.  Musculoskeletal:        General: No edema.     Cervical back: Neck supple.     Right lower leg: No edema.     Left lower leg: No edema.     Comments: Is able to move all extremities   Lymphadenopathy:     Cervical: No cervical adenopathy.  Skin:    General: Skin is warm and dry.  Neurological:     Mental Status: She is alert. Mental status is at baseline.  Psychiatric:        Mood and Affect: Mood and affect normal.     ASSESSMENT/ PLAN:  TODAY  1. Failure to thrive in adult 2. Interstitial lung disease 3. 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 Vascular dementia without behavioral disturbance 5. Pulmonary edema with congestive heart failure  At this time will continue her current medications Will continue to monitor her status.  More than likely this does represent a terminal state    MD is aware of resident's narcotic use and is in agreement with current plan of care. We will attempt to wean resident as appropriate.  Ok Edwards NP [facility]  Contact [phone] Monday through Friday 8am- 5pm  After hours call [phone]

## 2020-04-16 ENCOUNTER — Encounter (HOSPITAL_COMMUNITY): Payer: Self-pay | Admitting: Emergency Medicine

## 2020-04-16 ENCOUNTER — Encounter: Payer: Self-pay | Admitting: Adult Health

## 2020-04-16 ENCOUNTER — Emergency Department (HOSPITAL_COMMUNITY): Payer: Medicare Other

## 2020-04-16 ENCOUNTER — Other Ambulatory Visit: Payer: Self-pay

## 2020-04-16 ENCOUNTER — Non-Acute Institutional Stay (SKILLED_NURSING_FACILITY): Payer: Medicare Other | Admitting: Adult Health

## 2020-04-16 ENCOUNTER — Emergency Department (HOSPITAL_COMMUNITY)
Admission: EM | Admit: 2020-04-16 | Discharge: 2020-04-17 | Disposition: A | Discharge status: Home / Self Care | Payer: Medicare Other | Attending: Emergency Medicine | Admitting: Emergency Medicine

## 2020-04-16 DIAGNOSIS — E1122 Type 2 diabetes mellitus with diabetic chronic kidney disease: Secondary | ICD-10-CM | POA: Diagnosis not present

## 2020-04-16 DIAGNOSIS — N185 Chronic kidney disease, stage 5: Secondary | ICD-10-CM

## 2020-04-16 DIAGNOSIS — F0150 Vascular dementia without behavioral disturbance: Secondary | ICD-10-CM | POA: Diagnosis not present

## 2020-04-16 DIAGNOSIS — E860 Dehydration: Secondary | ICD-10-CM | POA: Insufficient documentation

## 2020-04-16 DIAGNOSIS — Z20822 Contact with and (suspected) exposure to covid-19: Secondary | ICD-10-CM | POA: Diagnosis not present

## 2020-04-16 DIAGNOSIS — I2510 Atherosclerotic heart disease of native coronary artery without angina pectoris: Secondary | ICD-10-CM | POA: Diagnosis not present

## 2020-04-16 DIAGNOSIS — R4182 Altered mental status, unspecified: Secondary | ICD-10-CM | POA: Diagnosis not present

## 2020-04-16 DIAGNOSIS — F0151 Vascular dementia with behavioral disturbance: Secondary | ICD-10-CM | POA: Insufficient documentation

## 2020-04-16 DIAGNOSIS — I1311 Hypertensive heart and chronic kidney disease without heart failure, with stage 5 chronic kidney disease, or end stage renal disease: Secondary | ICD-10-CM | POA: Diagnosis not present

## 2020-04-16 DIAGNOSIS — E039 Hypothyroidism, unspecified: Secondary | ICD-10-CM | POA: Diagnosis not present

## 2020-04-16 DIAGNOSIS — R627 Adult failure to thrive: Secondary | ICD-10-CM | POA: Insufficient documentation

## 2020-04-16 DIAGNOSIS — Z79899 Other long term (current) drug therapy: Secondary | ICD-10-CM | POA: Insufficient documentation

## 2020-04-16 DIAGNOSIS — N3001 Acute cystitis with hematuria: Secondary | ICD-10-CM | POA: Insufficient documentation

## 2020-04-16 LAB — BASIC METABOLIC PANEL WITH GFR
Anion gap: 10 (ref 5–15)
BUN: 74 mg/dL — ABNORMAL HIGH (ref 8–23)
CO2: 24 mmol/L (ref 22–32)
Calcium: 9.7 mg/dL (ref 8.9–10.3)
Chloride: 111 mmol/L (ref 98–111)
Creatinine, Ser: 5.61 mg/dL — ABNORMAL HIGH (ref 0.44–1.00)
GFR, Estimated: 7 mL/min — ABNORMAL LOW
Glucose, Bld: 98 mg/dL (ref 70–99)
Potassium: 5.3 mmol/L — ABNORMAL HIGH (ref 3.5–5.1)
Sodium: 145 mmol/L (ref 135–145)

## 2020-04-16 LAB — CBC WITH DIFFERENTIAL/PLATELET
Abs Immature Granulocytes: 0.06 10*3/uL (ref 0.00–0.07)
Basophils Absolute: 0 10*3/uL (ref 0.0–0.1)
Basophils Relative: 1 %
Eosinophils Absolute: 0.4 10*3/uL (ref 0.0–0.5)
Eosinophils Relative: 5 %
HCT: 35.6 % — ABNORMAL LOW (ref 36.0–46.0)
Hemoglobin: 10.4 g/dL — ABNORMAL LOW (ref 12.0–15.0)
Immature Granulocytes: 1 %
Lymphocytes Relative: 18 %
Lymphs Abs: 1.4 10*3/uL (ref 0.7–4.0)
MCH: 31.2 pg (ref 26.0–34.0)
MCHC: 29.2 g/dL — ABNORMAL LOW (ref 30.0–36.0)
MCV: 106.9 fL — ABNORMAL HIGH (ref 80.0–100.0)
Monocytes Absolute: 0.6 10*3/uL (ref 0.1–1.0)
Monocytes Relative: 8 %
Neutro Abs: 5.4 10*3/uL (ref 1.7–7.7)
Neutrophils Relative %: 67 %
Platelets: 255 10*3/uL (ref 150–400)
RBC: 3.33 MIL/uL — ABNORMAL LOW (ref 3.87–5.11)
RDW: 14.6 % (ref 11.5–15.5)
WBC: 7.8 10*3/uL (ref 4.0–10.5)
nRBC: 0 % (ref 0.0–0.2)

## 2020-04-16 LAB — URINALYSIS, ROUTINE W REFLEX MICROSCOPIC
Bilirubin Urine: NEGATIVE
Glucose, UA: NEGATIVE mg/dL
Ketones, ur: NEGATIVE mg/dL
Nitrite: NEGATIVE
Protein, ur: >=300 mg/dL — AB
Specific Gravity, Urine: 1.014 (ref 1.005–1.030)
WBC, UA: >50 WBC/hpf — ABNORMAL HIGH (ref 0–5)
pH: 5 (ref 5.0–8.0)

## 2020-04-16 LAB — POC SARS CORONAVIRUS 2 AG -  ED: SARS Coronavirus 2 Ag: NEGATIVE

## 2020-04-16 MED ORDER — SODIUM CHLORIDE 0.9 % IV BOLUS
500.0000 mL | Freq: Once | INTRAVENOUS | Status: AC
  Administered 2020-04-16: 500 mL via INTRAVENOUS

## 2020-04-16 MED ORDER — SODIUM CHLORIDE 0.9 % IV SOLN: INTRAVENOUS | Status: DC

## 2020-04-16 MED ORDER — CEPHALEXIN 500 MG PO CAPS: 500.0000 mg | ORAL_CAPSULE | Freq: Four times a day (QID) | ORAL | 0 refills | Status: DC

## 2020-04-16 MED ORDER — SODIUM CHLORIDE 0.9 % IV SOLN
1.0000 g | Freq: Once | INTRAVENOUS | Status: AC
  Administered 2020-04-16: 1 g via INTRAVENOUS
  Filled 2020-04-16: qty 10

## 2020-04-16 NOTE — Discharge Instructions (Signed)
Discussed with Dr. Karie Kirks.  He wants [place] to leave the IV in.  We will send her back to [facility] with that.  Work-up here seems to show evidence of urinary tract infection clearly she was dehydrated.  With IV fluids here going gently due to her kidney situation patient is now talking and drinking fluids out of a straw.  Stable for discharge back to [facility].  Would recommend either continuing IV Rocephin or either going with oral Keflex 500 mg every 6 while awaiting the results of the urine culture.

## 2020-04-16 NOTE — ED Triage Notes (Addendum)
Pt from [facility] c/o "failure to thrive." Pt given oxycodone and ativan routine for anxiety. Pt has stage 5 kidney disease. Family insisted on pt being seen in ED. Despite DNR form, family requests "everything to be done."

## 2020-04-16 NOTE — ED Provider Notes (Addendum)
[facility] EMERGENCY DEPARTMENT Provider Note   CSN: [id number] Arrival date & time: 04/16/20  1742     History Chief Complaint  Patient presents with  . Failure To Thrive    Regina Bush is a 87 y.o. female.  Patient sent from [facility] for altered mental status.  Just a few days ago patient was alert and talking.  She is the mother-in-law of Dr. Karie Kirks.  They are concerned about dehydration or may be a serious infection but patient is a DNR.  In discussion with Dr. Karie Kirks they wanted to take a look at simple things.  We were going to go and give IV hydration first rule out any evidence of any infection to include perhaps urinary tract infection.  If her mental status did not improve then plan was to go ahead and get a CT of the head.  They wanted to give her IV fluids over [facility] but they were not able to get an IV initiated.        Past Medical History:  Diagnosis Date  . Anemia   . Arthritis   . Cerebral infarction ([place])   . Coronary artery disease   . Dementia ([place])   . Diabetes mellitus without complication (HCC)    Type 2  . Dysphagia due to recent cerebral infarction   . Failure to thrive (0-17)   . Hypercalcemia   . Hypertension   . Pneumonitis   . Protein calorie malnutrition ([place])   . Subdural hematoma ([place])   . Thyroid disease     Patient Active Problem List   Diagnosis Date Noted  . Vascular dementia with behavior disturbance ([place]) 04/14/2020  . Elevated uric acid in blood 04/02/2020  . Anemia due to stage 5 chronic kidney disease ([place]) 03/13/2020  . Dysphagia 03/11/2020  . Failure to thrive in adult 03/10/2020  . Pulmonary edema with congestive heart failure ([place]) 03/10/2020  . Protein-calorie malnutrition, severe ([place]) 02/27/2020  . Type 2 diabetes mellitus with stage 4 chronic kidney disease and hypertension ([place]) 02/25/2020  . CKD stage 5 due to type 2 diabetes mellitus ([place]) 02/25/2020  . Aortic atherosclerosis ([place]) 02/25/2020  .  Chronic interstitial lung disease ([place]) 02/25/2020  . Chronic constipation 02/25/2020  . Vascular dementia without behavioral disturbance ([place]) 02/25/2020  . Lower urinary tract symptoms (LUTS) 02/25/2020  . Chronic non-seasonal allergic rhinitis 02/25/2020  . Retinopathy 02/25/2020  . Hyperlipidemia associated with type 2 diabetes mellitus ([place]) 02/25/2020  . Weakness 04/19/2019  . DNR (do not resuscitate) 03/21/2019  . Goals of care, counseling/discussion   . Palliative care by specialist   . Dyspnea   . Hyperkalemia   . Shortness of breath   . Acute respiratory failure with hypoxia ([place]) 03/19/2019  . AKI (acute kidney injury) ([place]) 03/18/2019  . Urinary retention 02/20/2018  . Hypothyroidism 11/19/2016  . History of CVA in adulthood 11/19/2016  . UTI (urinary tract infection) 11/15/2016  . ARF (acute renal failure) ([place]) 11/15/2016  . Hyponatremia 11/15/2016  . HCAP (healthcare-associated pneumonia) 08/19/2016  . Subdural hematoma ([place])   . Coronary artery disease     History reviewed. No pertinent surgical history.   OB History   No obstetric history on file.     Family History  Family history unknown: Yes    Social History   Tobacco Use  . Smoking status: Never Smoker  . Smokeless tobacco: Never Used  Vaping Use  . Vaping Use: Never used  Substance Use Topics  .  Alcohol use: No  . Drug use: No    Home Medications Prior to Admission medications   Medication Sig Start Date End Date Taking? Authorizing Provider  cephALEXin (KEFLEX) 500 MG capsule Take 1 capsule (500 mg total) by mouth 4 (four) times daily. 04/16/20  Yes Fredia Sorrow, MD  allopurinol (ZYLOPRIM) 100 MG tablet Take 100 mg by mouth daily. 03/25/20   [provider]  Amino Acids-Protein Hydrolys (FEEDING SUPPLEMENT, PRO-STAT SUGAR FREE 64,) LIQD Take 30 mLs by mouth 3 (three) times daily with meals. 02/25/20   [provider]  amLODipine (NORVASC) 5 MG tablet Take 5 mg by mouth  daily.    [provider]  carboxymethylcellulose (REFRESH TEARS) 0.5 % SOLN Place 1 drop into both eyes 3 (three) times daily as needed. 04/16/20   [provider]  cetirizine (ZYRTEC) 10 MG tablet Take 10 mg by mouth daily as needed for allergies. 02/22/20   [provider]  famotidine (PEPCID) 20 MG tablet Take 20 mg by mouth daily. 02/22/20   [provider]  fluticasone (FLONASE) 50 MCG/ACT nasal spray Place 2 sprays into both nostrils daily as needed.    [provider]  Fluticasone-Salmeterol (ADVAIR) 100-50 MCG/DOSE AEPB Inhale 1 puff into the lungs 2 (two) times daily. 03/03/20   [provider]  furosemide (LASIX) 40 MG tablet Take 40 mg by mouth daily. 03/19/20   [provider]  ipratropium-albuterol (DUONEB) 0.5-2.5 (3) MG/3ML SOLN Take 3 mLs by nebulization every 6 (six) hours as needed. 02/22/20   [provider]  LORazepam (ATIVAN) 2 MG tablet Take 0.5 mg by mouth every 6 (six) hours as needed for anxiety. 04/11/20   [provider]  magnesium oxide (MAG-OX) 400 MG tablet Take 400 mg by mouth daily.     [provider]  NON FORMULARY Diet Change: Regular (NAS), thin liquids 03/18/20   [provider]  NP THYROID 90 MG tablet Take 1 tablet (90 mg total) by mouth daily. 12/05/19   Doree Albee, MD  oxyCODONE (ROXICODONE INTENSOL) 20 MG/ML concentrated solution Take 5 mg by mouth every 6 (six) hours as needed for severe pain. 03/04/20   [provider]  OXYGEN Inhale 2 L into the lungs.    [provider]  rOPINIRole (REQUIP) 0.5 MG tablet Take 0.5 mg by mouth at bedtime. 02/22/20   [provider]  sodium zirconium cyclosilicate (LOKELMA) 10 g PACK packet Take 10 g by mouth daily.    [provider]  tamsulosin (FLOMAX) 0.4 MG CAPS capsule Take 0.4 mg by mouth every evening.    [provider]  traZODone (DESYREL) 50 MG tablet Take 50 mg by mouth  at bedtime. 02/22/20   [provider]  trimethoprim (TRIMPEX) 100 MG tablet Take 100 mg by mouth daily. For UTI 02/22/20   [provider]    Allergies    Duloxetine hcl, Morphine and related, Other, Tape, and Venlafaxine  Review of Systems   Review of Systems  Unable to perform ROS: Mental status change    Physical Exam Updated Vital Signs BP (!) 154/133   Pulse 74   Temp 98 F (36.7 C) (Rectal)   Resp 18   Ht 1.6 m (5\' 3" )   Wt 67.7 kg   SpO2 95%   BMI 26.43 kg/m   Physical Exam Vitals and nursing note reviewed.  Constitutional:      General: She is not in acute distress.    Appearance:  Normal appearance. She is well-developed and well-nourished.  HENT:     Head: Normocephalic and atraumatic.     Mouth/Throat:     Mouth: Mucous membranes are dry.     Comments: Mucous membranes very dry. Eyes:     Extraocular Movements: Extraocular movements intact.     Conjunctiva/sclera: Conjunctivae normal.     Pupils: Pupils are equal, round, and reactive to light.  Cardiovascular:     Rate and Rhythm: Normal rate and regular rhythm.     Heart sounds: No murmur heard.   Pulmonary:     Effort: Pulmonary effort is normal. No respiratory distress.     Breath sounds: Normal breath sounds.  Abdominal:     Palpations: Abdomen is soft.     Tenderness: There is no abdominal tenderness.  Musculoskeletal:        General: No edema.     Cervical back: Normal range of motion and neck supple.  Skin:    General: Skin is warm and dry.     Capillary Refill: Capillary refill takes less than 2 seconds.  Neurological:     Mental Status: She is alert.     Comments: TalkPatient will wake up.  But getting much.  Seems to be confused.  Moving all 4 extremities.  Psychiatric:        Mood and Affect: Mood and affect normal.     ED Results / Procedures / Treatments   Labs (all labs ordered are listed, but only abnormal results are displayed) Labs Reviewed  CBC WITH  DIFFERENTIAL/PLATELET - Abnormal; Notable for the following components:      Result Value   RBC 3.33 (*)    Hemoglobin 10.4 (*)    HCT 35.6 (*)    MCV 106.9 (*)    MCHC 29.2 (*)    All other components within normal limits  BASIC METABOLIC PANEL - Abnormal; Notable for the following components:   Potassium 5.3 (*)    BUN 74 (*)    Creatinine, Ser 5.61 (*)    GFR, Estimated 7 (*)    All other components within normal limits  URINALYSIS, ROUTINE W REFLEX MICROSCOPIC - Abnormal; Notable for the following components:   APPearance CLOUDY (*)    Hgb urine dipstick MODERATE (*)    Protein, ur >=300 (*)    Leukocytes,Ua LARGE (*)    WBC, UA >50 (*)    Bacteria, UA RARE (*)    All other components within normal limits  URINE CULTURE  POC SARS CORONAVIRUS 2 AG -  ED    EKG EKG Interpretation  Date/Time:  Wednesday April 16 2020 21:04:23 EST Ventricular Rate:  68 PR Interval:    QRS Duration: 151 QT Interval:  462 QTC Calculation: 492 R Axis:   -70 Text Interpretation: Sinus rhythm RBBB and LAFB Probable left ventricular hypertrophy No significant change since Confirmed by Fredia Sorrow [phone]) on 04/16/2020 9:19:54 PM   Radiology US Venous Img Lower Unilateral Right (DVT)  Result Date: 04/15/2020 CLINICAL DATA:  Right lower extremity edema EXAM: RIGHT LOWER EXTREMITY VENOUS DOPPLER ULTRASOUND TECHNIQUE: Gray-scale sonography with graded compression, as well as color Doppler and duplex ultrasound were performed to evaluate the lower extremity deep venous systems from the level of the common femoral vein and including the common femoral, femoral, profunda femoral, popliteal and calf veins including the posterior tibial, peroneal and gastrocnemius veins when visible. The superficial great saphenous vein was also interrogated. Spectral Doppler was utilized to evaluate flow at rest  and with distal augmentation maneuvers in the common femoral, femoral and popliteal veins. COMPARISON:   08/10/2016 FINDINGS: Contralateral Common Femoral Vein: Respiratory phasicity is normal and symmetric with the symptomatic side. No evidence of thrombus. Normal compressibility. Common Femoral Vein: No evidence of thrombus. Normal compressibility, respiratory phasicity and response to augmentation. Saphenofemoral Junction: No evidence of thrombus. Normal compressibility and flow on color Doppler imaging. Profunda Femoral Vein: No evidence of thrombus. Normal compressibility and flow on color Doppler imaging. Femoral Vein: No evidence of thrombus. Normal compressibility, respiratory phasicity and response to augmentation. Popliteal Vein: No evidence of thrombus. Normal compressibility, respiratory phasicity and response to augmentation. Calf Veins: No evidence of thrombus. Normal compressibility and flow on color Doppler imaging. Superficial Great Saphenous Vein: No evidence of thrombus. Normal compressibility. Venous Reflux:  None. Other Findings:  None. IMPRESSION: No evidence of right lower extremity deep venous thrombosis. Electronically Signed   By: Davina Poke D.O.   On: 04/15/2020 16:23   DG Chest Port 1 View  Result Date: 04/16/2020 CLINICAL DATA:  Altered mental status EXAM: PORTABLE CHEST 1 VIEW COMPARISON:  02/05/2020 FINDINGS: Cardiac shadow is enlarged but stable. Aortic calcifications are again seen and stable. Lungs are well aerated bilaterally. No focal infiltrate or effusion is seen. No acute bony abnormality is noted. IMPRESSION: No acute abnormality seen. Electronically Signed   By: Inez Catalina M.D.   On: 04/16/2020 19:03    Procedures Procedures   Medications Ordered in ED Medications  0.9 %  sodium chloride infusion ( Intravenous New Bag/Given 04/16/20 1950)  cefTRIAXone (ROCEPHIN) 1 g in sodium chloride 0.9 % 100 mL IVPB (1 g Intravenous New Bag/Given 04/16/20 2317)  sodium chloride 0.9 % bolus 500 mL (0 mLs Intravenous Stopped 04/16/20 2152)    ED Course  I have reviewed the  triage vital signs and the nursing notes.  Pertinent labs & imaging results that were available during my care of the patient were reviewed by me and considered in my medical decision making (see chart for details).    MDM Rules/Calculators/A&P                         Work-up here in the emergency department started with IV fluids.  Patient did actually perk up with IV fluids.  Started talking is now drinking out of a straw.  Mucous membranes are still little dry but they are getting more moist.  Due to her renal function did gave her 500 cc bolus and then just 75 cc an hour.  We will leave the IV in place and that can be continued at the [facility] if needed.  Urinalysis consistent with probable urinary tract infection urine sent for culture.  Covid antigen testing was negative.  Patient's renal function is baseline.  Occluding her potassium is 5.3.  No significant leukocytosis.  Overall patient showing signs of improvement.  Gave 1 g of Rocephin here recommending at least Keflex to be continued orally there.  Or they can do IV Rocephin.  Will [facility] back to [facility] with the IV in place.  Dr. Karie Kirks updated on the findings  Final Clinical Impression(s) / ED Diagnoses Final diagnoses:  Dehydration  Acute cystitis with hematuria    Rx / DC Orders ED Discharge Orders         Ordered    cephALEXin (KEFLEX) 500 MG capsule  4 times daily        04/16/20 2332  Fredia Sorrow, MD 04/16/20 0910    Fredia Sorrow, MD 04/16/20 [phone]

## 2020-04-16 NOTE — Progress Notes (Signed)
Location:  [facility] Room Number: 151/P Place of Service:  SNF (31)   CODE STATUS: DNR  Allergies  Allergen Reactions  . Duloxetine Hcl Other (See Comments)    unknown unknown  . Morphine And Related Other (See Comments)    Passed out  Passed out   . Other     adhesive  . Tape     adhesive  . Venlafaxine Other (See Comments)    unknown unknown    Chief Complaint  Patient presents with  . Acute Visit    Pain Management    HPI:  Staff report that today she has has more pain; is restless is yelling out more. She required the use of ativan to help with pain management. She continues not to eat or drink. There re no reports of fevers present. Her urine output is poor; with no foul odor.   Past Medical History:  Diagnosis Date  . Anemia   . Arthritis   . Cerebral infarction ([place])   . Coronary artery disease   . Dementia ([place])   . Diabetes mellitus without complication (HCC)    Type 2  . Dysphagia due to recent cerebral infarction   . Failure to thrive (0-17)   . Hypercalcemia   . Hypertension   . Pneumonitis   . Protein calorie malnutrition ([place])   . Subdural hematoma ([place])   . Thyroid disease     History reviewed. No pertinent surgical history.  Social History   Socioeconomic History  . Marital status: Married    Spouse name: Not on file  . Number of children: Not on file  . Years of education: Not on file  . Highest education level: Not on file  Occupational History  . Not on file  Tobacco Use  . Smoking status: Never Smoker  . Smokeless tobacco: Never Used  Vaping Use  . Vaping Use: Never used  Substance and Sexual Activity  . Alcohol use: No  . Drug use: No  . Sexual activity: Not Currently  Other Topics Concern  . Not on file  Social History Narrative   Widow,husband passed away 2020,married for 57 years.Retired.   Social Determinants of Health   Financial Resource Strain: Not on file  Food Insecurity: Not on  file  Transportation Needs: Not on file  Physical Activity: Not on file  Stress: Not on file  Social Connections: Not on file  Intimate Partner Violence: Not on file   Family History  Family history unknown: Yes      VITAL SIGNS BP (!) 159/45   Pulse 65   Temp 97.9 F (36.6 C)   Resp (!) 21   Ht 5\' 3"  (1.6 m)   Wt 149 lb 3.2 oz (67.7 kg)   SpO2 92%   BMI 26.43 kg/m   Outpatient Encounter Medications as of 04/16/2020  Medication Sig  . allopurinol (ZYLOPRIM) 100 MG tablet Take 100 mg by mouth daily.  . Amino Acids-Protein Hydrolys (FEEDING SUPPLEMENT, PRO-STAT SUGAR FREE 64,) LIQD Take 30 mLs by mouth 3 (three) times daily with meals.  Marland Kitchen amLODipine (NORVASC) 5 MG tablet Take 5 mg by mouth daily.  . carboxymethylcellulose (REFRESH TEARS) 0.5 % SOLN Place 1 drop into both eyes 3 (three) times daily as needed.  . cetirizine (ZYRTEC) 10 MG tablet Take 10 mg by mouth daily as needed for allergies.  . famotidine (PEPCID) 20 MG tablet Take 20 mg by mouth daily.  . fluticasone (FLONASE) 50 MCG/ACT  nasal spray Place 2 sprays into both nostrils daily as needed.  . Fluticasone-Salmeterol (ADVAIR) 100-50 MCG/DOSE AEPB Inhale 1 puff into the lungs 2 (two) times daily.  . furosemide (LASIX) 40 MG tablet Take 40 mg by mouth daily.  Marland Kitchen ipratropium-albuterol (DUONEB) 0.5-2.5 (3) MG/3ML SOLN Take 3 mLs by nebulization every 6 (six) hours as needed.  Marland Kitchen LORazepam (ATIVAN) 2 MG tablet Take 0.5 mg by mouth every 6 (six) hours as needed for anxiety.  . magnesium oxide (MAG-OX) 400 MG tablet Take 400 mg by mouth daily.   . NON FORMULARY Diet Change: Regular (NAS), thin liquids  . NP THYROID 90 MG tablet Take 1 tablet (90 mg total) by mouth daily.  Marland Kitchen oxyCODONE (ROXICODONE INTENSOL) 20 MG/ML concentrated solution Take 5 mg by mouth every 6 (six) hours as needed for severe pain.  . OXYGEN Inhale 2 L into the lungs.  Marland Kitchen rOPINIRole (REQUIP) 0.5 MG tablet Take 0.5 mg by mouth at bedtime.  . sodium zirconium  cyclosilicate (LOKELMA) 10 g PACK packet Take 10 g by mouth daily.  . tamsulosin (FLOMAX) 0.4 MG CAPS capsule Take 0.4 mg by mouth every evening.  . traZODone (DESYREL) 50 MG tablet Take 50 mg by mouth at bedtime.  Marland Kitchen trimethoprim (TRIMPEX) 100 MG tablet Take 100 mg by mouth daily. For UTI  . [DISCONTINUED] epoetin alfa (EPOGEN) 3000 UNIT/ML injection Inject 3,000 Units into the vein every 14 (fourteen) days.   No facility-administered encounter medications on file as of 04/16/2020.     SIGNIFICANT DIAGNOSTIC EXAMS  PREVIOUS   11-14-16:  Ct of head:  1. No acute intracranial abnormality identified. 2. 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 3. Moderate for age chronic microvascular ischemic changes and parenchymal volume loss of the brain. 4. Bilateral mastoid effusions. 5. 12 mm nodule left suboccipital scalp, direct visualization recommended, probably sebaceous cyst.  02-05-20: chest x-ray:  1. Chronic interstitial lung disease with mild left basilar atelectasis and/or infiltrate. 2. Small left pleural effusion.  02-06-20: ct of chest:  1. Small bilateral pleural effusions and associated atelectasis or consolidation. 2. Diffuse interlobular septal thickening, consistent with pulmonary edema. 3. Diffuse bilateral bronchial wall thickening, nonspecific and may reflect infectious or inflammatory bronchitis or edema. 4. Bandlike scarring and or atelectasis of the left lower lobe and right middle lobe, most likely sequelae of prior infection. 5. No specific findings to suggest fibrotic interstitial lung disease, although assessment is limited given the presence of edema and effusions. 6. Cardiomegaly and coronary artery disease. 7. Enlargement of the main pulmonary artery, as can be seen in pulmonary hypertension. 8. Enlarged mediastinal lymph nodes, nonspecific and likely reactive.  9. Aortic Atherosclerosis    02-08-20: renal ultrasound:  1. No acute abnormality. No hydronephrosis or bladder distention. 2. Bilateral simple renal cysts. Bilateral renal cortical thinning. Right kidney is slightly atrophic.  03-04-20: chest x-ray (Dr Darrol Angel report);  Bilateral pleural effusions R>L Increased interstitial markings bilateral upper lobes.  Bilateral pulmonary edema Significant osteoporosis Cardiomegaly   NO NEW EXAMS.   LABS REVIEWED PREVIOUS   02-09-20: wbc 14.3; hgb 8.5; hct 27.5; mcv 102.2 plt 207;  02-10-20: hgb a1c 7.0 02-12-20: glucose 241; bun 88; creat 2.71; k+ 5.0; na++ 138; ca 8.8 phos 5.1 albumin 2.7  02-25-20: wbc 14.3; hgb 8.5; hct 27.5; mcv 102.2 plt 207; glucose 102; bun 44; creat 2.70; k+ 6.4; na++ 140 ca 8.6 liver normal albumin 2.8 tsh 7.031vit  D 77.07 02-26-20: glucose 99; bun 37; creat 2.54; k+ 5.8; na++ 141; liver normal albumin 2.7 02-28-20: glucose 120; bun 39; creat 2.66; k+ 6.9; na++ 140; ca 8.6  03-04-20: wbc 6.4; hgb 7.7; hct 26.4; mcv 107.3 plt 225; glucose 76; bun 30; creat 3.01; k+ 5.4; na++ 144; ca 8.8  03-05-20: glucose 72; bun 30; creat 3.24; k+ 5.4; na++ 144; ca 8.9  03-11-20: hgb 8.4; hct 28.1; glucose 79; bun 37; creat 3.66; k+ 6.0; na++ 144 ca 9.1 GFR 11  03-18-20: glucose 92; bun 40; creat 4.51; k+ 5.6; na++ 143; ca 9.1 GFR 9  03-24-20: glucose 83; bun 55; creat 4.07; k+ 5.5; na++ 142; ca 9.3 GFR 10; vit B 12: 318; vit D 75.09; uric acid 8.1 04-09-20 glucose 120; bun 74; creat 4.10 k+ 5.5; na++ 137; ca 9.5 GFR 10  NO NEW LABS.   Review of Systems  Unable to perform ROS: Medical condition    Physical Exam Constitutional:      General: She is not in acute distress.    Appearance: She is well-developed and well-nourished. She is not diaphoretic.  HENT:     Mouth/Throat:     Mouth: Mucous membranes are dry.  Neck:     Thyroid: No thyromegaly.  Cardiovascular:     Rate and Rhythm: Bradycardia present. Rhythm irregular.     Pulses: Normal pulses and  intact distal pulses.     Heart sounds: Murmur heard.    Pulmonary:     Effort: Pulmonary effort is normal. No respiratory distress.     Breath sounds: Normal breath sounds.  Abdominal:     General: Bowel sounds are normal. There is no distension.     Palpations: Abdomen is soft.     Tenderness: There is no abdominal tenderness.  Musculoskeletal:        General: No edema.     Cervical back: Neck supple.     Right lower leg: No edema.     Left lower leg: No edema.     Comments: Is able to move all extremities    Lymphadenopathy:     Cervical: No cervical adenopathy.  Skin:    General: Skin is warm and dry.  Neurological:     Mental Status: She is alert.  Psychiatric:        Mood and Affect: Mood and affect and mood normal.        ASSESSMENT/ PLAN:  TODAY  1. Failure to thrive in adult 2. Vascular dementia without behavioral disturbance 3. 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   We are unable to this time to increase her medications. Will continue oxyfast 5 mg every 6 hours as needed and ativan 0.5 mg every 6 hours as needed. Will continue to monitor her status.     MD is aware of resident's narcotic use and is in agreement with current plan of care. We will attempt to wean resident as appropriate.  Ok Edwards NP [facility]  Contact [phone] Monday through Friday 8am- 5pm  After hours call [phone]

## 2020-04-17 ENCOUNTER — Encounter: Payer: Self-pay | Admitting: Adult Health

## 2020-04-17 ENCOUNTER — Non-Acute Institutional Stay (SKILLED_NURSING_FACILITY): Payer: Medicare Other | Admitting: Adult Health

## 2020-04-17 DIAGNOSIS — E1122 Type 2 diabetes mellitus with diabetic chronic kidney disease: Secondary | ICD-10-CM | POA: Diagnosis not present

## 2020-04-17 DIAGNOSIS — N185 Chronic kidney disease, stage 5: Secondary | ICD-10-CM | POA: Diagnosis not present

## 2020-04-17 DIAGNOSIS — R627 Adult failure to thrive: Secondary | ICD-10-CM

## 2020-04-17 NOTE — Progress Notes (Unsigned)
Location:  [facility] Room Number: 151/P Place of Service:  SNF (31)   CODE STATUS: DNR  Allergies  Allergen Reactions  . Duloxetine Hcl Other (See Comments)    unknown unknown  . Morphine And Related Other (See Comments)    Passed out  Passed out   . Other     adhesive  . Tape     adhesive  . Venlafaxine Other (See Comments)    unknown unknown    Chief Complaint  Patient presents with  . Follow-up    Follow Up ED    She had a nephrology consult yesterday. He stopped lasix and ordered for one liter of NS to be given. The nurses were unable to access a vein. Her family opted to have her go to the ED. She did receive one liter of fluid. Had a u/a performed received a gm of rocephin with an order for keflex. This morning she is resting quietly. There are no indications of pain or distress. There are no reports of fevers.    Past Medical History:  Diagnosis Date  . Anemia   . Arthritis   . Cerebral infarction ([place])   . Coronary artery disease   . Dementia ([place])   . Diabetes mellitus without complication (HCC)    Type 2  . Dysphagia due to recent cerebral infarction   . Failure to thrive (0-17)   . Hypercalcemia   . Hypertension   . Pneumonitis   . Protein calorie malnutrition ([place])   . Subdural hematoma ([place])   . Thyroid disease     History reviewed. No pertinent surgical history.  Social History   Socioeconomic History  . Marital status: Married    Spouse name: Not on file  . Number of children: Not on file  . Years of education: Not on file  . Highest education level: Not on file  Occupational History  . Not on file  Tobacco Use  . Smoking status: Never Smoker  . Smokeless tobacco: Never Used  Vaping Use  . Vaping Use: Never used  Substance and Sexual Activity  . Alcohol use: No  . Drug use: No  . Sexual activity: Not Currently  Other Topics Concern  . Not on file  Social History Narrative   Widow,husband passed away  2020,married for 57 years.Retired.   Social Determinants of Health   Financial Resource Strain: Not on file  Food Insecurity: Not on file  Transportation Needs: Not on file  Physical Activity: Not on file  Stress: Not on file  Social Connections: Not on file  Intimate Partner Violence: Not on file   Family History  Family history unknown: Yes  FH noncontributory due to advanced age of 57.    VITAL SIGNS BP (!) 170/70   Pulse 70   Temp 98 F (36.7 C)   Resp 20   Ht 5\' 3"  (1.6 m)   Wt 149 lb 3.2 oz (67.7 kg)   SpO2 97%   BMI 26.43 kg/m   Outpatient Encounter Medications as of 04/17/2020  Medication Sig  . allopurinol (ZYLOPRIM) 100 MG tablet Take 100 mg by mouth daily.  . Amino Acids-Protein Hydrolys (FEEDING SUPPLEMENT, PRO-STAT SUGAR FREE 64,) LIQD Take 30 mLs by mouth 3 (three) times daily with meals.  Marland Kitchen amLODipine (NORVASC) 5 MG tablet Take 5 mg by mouth daily.  . cephALEXin (KEFLEX) 500 MG capsule Take 1 capsule (500 mg total) by mouth 4 (four) times daily.  . cetirizine (  ZYRTEC) 10 MG tablet Take 10 mg by mouth daily as needed for allergies.  . famotidine (PEPCID) 20 MG tablet Take 20 mg by mouth daily.  . fluticasone (FLONASE) 50 MCG/ACT nasal spray Place 2 sprays into both nostrils daily as needed.  . Fluticasone-Salmeterol (ADVAIR) 100-50 MCG/DOSE AEPB Inhale 1 puff into the lungs 2 (two) times daily.  Marland Kitchen ipratropium-albuterol (DUONEB) 0.5-2.5 (3) MG/3ML SOLN Take 3 mLs by nebulization every 6 (six) hours as needed.  Marland Kitchen LORazepam (ATIVAN) 2 MG tablet Take 0.5 mg by mouth every 6 (six) hours as needed for anxiety.  . magnesium oxide (MAG-OX) 400 MG tablet Take 400 mg by mouth daily.   . NON FORMULARY Diet Change: Regular (NAS), thin liquids  . NP THYROID 90 MG tablet Take 1 tablet (90 mg total) by mouth daily.  Marland Kitchen oxyCODONE (ROXICODONE INTENSOL) 20 MG/ML concentrated solution Take 5 mg by mouth every 6 (six) hours as needed for severe pain.  . OXYGEN Inhale 2 L into the  lungs.  Marland Kitchen rOPINIRole (REQUIP) 0.5 MG tablet Take 0.5 mg by mouth at bedtime.  . sodium chloride 0.9 % SOLN parenteral solution; - ; amt: 50 mL/hr; intravenous Special Instructions: 1 Liter at 50 mL/hr, then D/C order. Every Shift  . sodium zirconium cyclosilicate (LOKELMA) 10 g PACK packet Take 10 g by mouth daily.  . tamsulosin (FLOMAX) 0.4 MG CAPS capsule Take 0.4 mg by mouth every evening.  . traZODone (DESYREL) 50 MG tablet Take 50 mg by mouth at bedtime.  Marland Kitchen trimethoprim (TRIMPEX) 100 MG tablet Take 100 mg by mouth daily. For UTI  . [DISCONTINUED] carboxymethylcellulose (REFRESH PLUS) 0.5 % SOLN Place 1 drop into both eyes 3 (three) times daily as needed.  . [DISCONTINUED] furosemide (LASIX) 40 MG tablet Take 40 mg by mouth daily.   No facility-administered encounter medications on file as of 04/17/2020.     SIGNIFICANT DIAGNOSTIC EXAMS    PREVIOUS   11-14-16:  Ct of head:  1. No acute intracranial abnormality identified. 2. 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 3. Moderate for age chronic microvascular ischemic changes and parenchymal volume loss of the brain. 4. Bilateral mastoid effusions. 5. 12 mm nodule left suboccipital scalp, direct visualization recommended, probably sebaceous cyst.  02-05-20: chest x-ray:  1. Chronic interstitial lung disease with mild left basilar atelectasis and/or infiltrate. 2. Small left pleural effusion.  02-06-20: ct of chest:  1. Small bilateral pleural effusions and associated atelectasis or consolidation. 2. Diffuse interlobular septal thickening, consistent with pulmonary edema. 3. Diffuse bilateral bronchial wall thickening, nonspecific and may reflect infectious or inflammatory bronchitis or edema. 4. Bandlike scarring and or atelectasis of the left lower lobe and right middle lobe, most likely sequelae of prior infection. 5. No specific findings to suggest  fibrotic interstitial lung disease, although assessment is limited given the presence of edema and effusions. 6. Cardiomegaly and coronary artery disease. 7. Enlargement of the main pulmonary artery, as can be seen in pulmonary hypertension. 8. Enlarged mediastinal lymph nodes, nonspecific and likely reactive.  9. Aortic Atherosclerosis   02-08-20: renal ultrasound:  1. No acute abnormality. No hydronephrosis or bladder distention. 2. Bilateral simple renal cysts. Bilateral renal cortical thinning. Right kidney is slightly atrophic.  03-04-20: chest x-ray (Dr Darrol Angel report);  Bilateral pleural effusions R>L Increased interstitial markings bilateral upper lobes.  Bilateral pulmonary edema Significant osteoporosis Cardiomegaly   NO NEW EXAMS.   LABS REVIEWED PREVIOUS  02-09-20: wbc 14.3; hgb 8.5; hct 27.5; mcv 102.2 plt 207;  02-10-20: hgb a1c 7.0 02-12-20: glucose 241; bun 88; creat 2.71; k+ 5.0; na++ 138; ca 8.8 phos 5.1 albumin 2.7  02-25-20: wbc 14.3; hgb 8.5; hct 27.5; mcv 102.2 plt 207; glucose 102; bun 44; creat 2.70; k+ 6.4; na++ 140 ca 8.6 liver normal albumin 2.8 tsh 7.031vit D 77.07 02-26-20: glucose 99; bun 37; creat 2.54; k+ 5.8; na++ 141; liver normal albumin 2.7 02-28-20: glucose 120; bun 39; creat 2.66; k+ 6.9; na++ 140; ca 8.6  03-04-20: wbc 6.4; hgb 7.7; hct 26.4; mcv 107.3 plt 225; glucose 76; bun 30; creat 3.01; k+ 5.4; na++ 144; ca 8.8  03-05-20: glucose 72; bun 30; creat 3.24; k+ 5.4; na++ 144; ca 8.9  03-11-20: hgb 8.4; hct 28.1; glucose 79; bun 37; creat 3.66; k+ 6.0; na++ 144 ca 9.1 GFR 11  03-18-20: glucose 92; bun 40; creat 4.51; k+ 5.6; na++ 143; ca 9.1 GFR 9  03-24-20: glucose 83; bun 55; creat 4.07; k+ 5.5; na++ 142; ca 9.3 GFR 10; vit B 12: 318; vit D 75.09; uric acid 8.1 04-09-20 glucose 120; bun 74; creat 4.10 k+ 5.5; na++ 137; ca 9.5 GFR 10  NO NEW LABS.   Review of Systems  Unable to perform ROS: Medical condition   Physical Exam Constitutional:       General: She is not in acute distress.    Appearance: She is well-developed and well-nourished. She is not diaphoretic.  HENT:     Mouth/Throat:     Mouth: Mucous membranes are dry.  Neck:     Thyroid: No thyromegaly.  Cardiovascular:     Rate and Rhythm: Bradycardia present. Rhythm irregular.     Pulses: Normal pulses and intact distal pulses.     Heart sounds: Murmur heard.    Pulmonary:     Effort: Pulmonary effort is normal. No respiratory distress.     Breath sounds: Normal breath sounds.     Comments: Is on 02 Abdominal:     General: Bowel sounds are normal. There is no distension.     Palpations: Abdomen is soft.     Tenderness: There is no abdominal tenderness.  Musculoskeletal:        General: No edema.     Right lower leg: No edema.     Left lower leg: No edema.     Comments: Is able to move all extremities   Lymphadenopathy:     Cervical: No cervical adenopathy.  Skin:    General: Skin is warm and dry.  Psychiatric:        Mood and Affect: Mood and affect normal.     ASSESSMENT/ PLAN:  TODAY  1. Failure to thrive in adult 2. CKD stage 5 due to type 2 diabetes mellitus  Will continue keflex until c&s returns Will continue to monitor her status.    MD is aware of resident's narcotic use and is in agreement with current plan of care. We will attempt to wean resident as appropriate.  Ok Edwards NP [facility]  Contact [phone] Monday through Friday 8am- 5pm  After hours call [phone]

## 2020-04-18 ENCOUNTER — Encounter: Payer: Self-pay | Admitting: Internal Medicine

## 2020-04-18 ENCOUNTER — Non-Acute Institutional Stay (SKILLED_NURSING_FACILITY): Payer: Medicare Other | Admitting: Internal Medicine

## 2020-04-18 ENCOUNTER — Encounter: Payer: Self-pay | Admitting: Adult Health

## 2020-04-18 DIAGNOSIS — R627 Adult failure to thrive: Secondary | ICD-10-CM

## 2020-04-18 DIAGNOSIS — F0151 Vascular dementia with behavioral disturbance: Secondary | ICD-10-CM | POA: Diagnosis not present

## 2020-04-18 DIAGNOSIS — N185 Chronic kidney disease, stage 5: Secondary | ICD-10-CM

## 2020-04-18 DIAGNOSIS — E1122 Type 2 diabetes mellitus with diabetic chronic kidney disease: Secondary | ICD-10-CM | POA: Diagnosis not present

## 2020-04-18 DIAGNOSIS — I10 Essential (primary) hypertension: Secondary | ICD-10-CM

## 2020-04-18 DIAGNOSIS — F01518 Vascular dementia, unspecified severity, with other behavioral disturbance: Secondary | ICD-10-CM

## 2020-04-18 NOTE — Assessment & Plan Note (Addendum)
04/18/2020; she is refusing help with ADLs.  She is also hallucinating as to imagined individuals in the room.

## 2020-04-18 NOTE — Progress Notes (Signed)
NURSING HOME LOCATION:  [facility] ROOM NUMBER:  [id number]  CODE STATUS:  DNR  PCP:  Hurshel Party MD  This is a nursing facility follow up visit of chronic medical diagnoses & assessment of prognosis.  Interim medical record and care since last [place] visit was updated with review of diagnostic studies and change in clinical status since last visit were documented.  HPI: The patient had been a long-term resident of [facility] until she was hospitalized 11/23-11/30/2021 with acute hypoxic respiratory failure in the context of bilateral pulmonary edema and inflammatory bronchitis on CT imaging.  She received parenteral steroids, aggressive pulmonary toilet, and broad-spectrum antibiotics.  The pulmonary edema was in the context of grade 1 diastolic dysfunction and history of CKD stage IV.  During that hospitalization creatinine peaked to 3.35, baseline was felt to be in the range of 2.8.  There was not significant diuretic response to Lasix. Course was complicated by hyperglycemia in the context of the steroids.  A1c was 7%.  Here at the facility glucose is checked twice daily and have ranged from a low of 78 up to a high of 220. Dr Mila Merry Bhutani,Nephrology, is actively following the patient. On 2/2 he D/Ced Amlodipine & Lasix and ordered 1 L IV NS for component of dehydration in the context of the CKD. This required an ED visit for IV insertion due to poor venous access. Dr Lennice Sites Physician , documented > 50 WBC but rare bacteria on urinalysis. Ceftriaxone & PO Keflex X 7 days were ordered pending C&S. It has been reincubated.MRSA had been documented in 01/2020.  On 2/2 BUN was 74, creatinine 5.61, and GFR 7.  Potassium was 5.3. Dr Theador Hawthorne has prescribed [facility]  Extensive collagen vascular and immune studies have been performed which were unrevealing. As he noted she has had DM > 30 years & HTN > 2 decades.  A hypochromic anemia was present with H/H  of 10.4/35.6.Dr Theador Hawthorne also had prescribed Epogen.   Review of systems: Dementia invalidated responses.She stated that she had just gotten out of the hospital but did not know why she had been there.  She stated that she was "doing pretty good".  She denied any active symptoms.  She could not give me the date, even the year.  She could not name the president.  She did hallucinate intermittently during the exam. Staff reports that she has been refusing ADL assistance.She did complain of being warm and pushed the bed clothing off.  They also noted intermittent hallucinations, talking to imagined individuals.  Physical exam:  Pertinent or positive findings: She appears her stated age.  She is chronically ill and almost cachectic.  Temporal wasting is present.  She is wearing nasal O2.  Initially she was asleep but did wake during the exam.  She is slightly hoarse and speech is slurred.  Ptosis is greater on the right than the left.  There is dental malalignment.  The tongue is dry.  First heart sound is accentuated.  Breath sounds are decreased.  Abdomen is doughy.  Dorsalis pedis pulses are stronger than posterior tibial pulses.  There is atrophy of the limbs, especially the lower limbs as well as interosseous wasting.  She has mild OA changes of the hands.  There is severe clawing of the toes.  She was profoundly weak to opposition.  While I was examining her she turned her head to the right and commanded "quit pushing me", apparently hallucinating as to the  presence of a third person in the room.  General appearance:  no acute distress, increased work of breathing is present.   Lymphatic: No lymphadenopathy about the head, neck, axilla. Eyes: No conjunctival inflammation or lid edema is present. There is no scleral icterus. Ears:  External ear exam shows no significant lesions or deformities.   Nose:  External nasal examination shows no deformity or inflammation. Nasal mucosa are pink and moist without  lesions, exudates Oral exam:  Lips and gums are healthy appearing. There is no oropharyngeal erythema or exudate. Neck:  No thyromegaly, masses, tenderness noted.    Heart:  Normal rate and regular rhythm. S2 normal without gallop, murmur, click, rub .  Lungs:  without wheezes, rhonchi, rales, rubs. Abdomen: Bowel sounds are normal. Abdomen is nontender with no organomegaly, hernias, masses. GU: Deferred  Extremities:  No cyanosis, clubbing, edema  Neurologic exam :Balance, Rhomberg, finger to nose testing could not be completed due to clinical state Skin: Warm & dry. No significant lesions or rash.  See summary under each active problem in the Problem List with associated updated therapeutic plan

## 2020-04-18 NOTE — Assessment & Plan Note (Addendum)
04/16/2020 BUN 74, creatinine 5.61, and GFR 7.  Potassium was 5.3. On Lokelma. Amlodipine & Lasix D/Ced. Off CCB , BP up. Will discuss restarting low dose amlodipine (2.5 mg) or BB with Dr Theador Hawthorne Decrease Allopurinol to 50 mg M,W, F or D/C as GFR 7.

## 2020-04-18 NOTE — Patient Instructions (Signed)
See assessment and plan under each diagnosis in the problem list and acutely for this visit 

## 2020-04-18 NOTE — Progress Notes (Signed)
   NURSING HOME LOCATION:  [facility] ROOM NUMBER:  [id number]  CODE STATUS:  DNR  PCP:  Doree Albee, MD  [place] [address]  This is a nursing facility follow up visit for specific acute issue of   to document compliance with Regulation 483.30 (c) in The [place] Manual Phase 2 which mandates caregiver visit ( visits can alternate among physician, PA or NP as per statutes) within 10 days of 30 days / 60 days/ 90 days post admission to SNF date    Interim medical record and care since last [place] visit was updated with review of diagnostic studies and change in clinical status since last visit were documented.  HPI:  Review of systems: Dementia invalidated responses. Date given as   Constitutional: No fever, significant weight change, fatigue  Eyes: No redness, discharge, pain, vision change ENT/mouth: No nasal congestion,  purulent discharge, earache, change in hearing, sore throat  Cardiovascular: No chest pain, palpitations, paroxysmal nocturnal dyspnea, claudication, edema  Respiratory: No cough, sputum production, hemoptysis, DOE, significant snoring, apnea   Gastrointestinal: No heartburn, dysphagia, abdominal pain, nausea /vomiting, rectal bleeding, melena, change in bowels Genitourinary: No dysuria, hematuria, pyuria, incontinence, nocturia Musculoskeletal: No joint stiffness, joint swelling, weakness, pain Dermatologic: No rash, pruritus, change in appearance of skin Neurologic: No dizziness, headache, syncope, seizures, numbness, tingling Psychiatric: No significant anxiety, depression, insomnia, anorexia Endocrine: No change in hair/skin/nails, excessive thirst, excessive hunger, excessive urination  Hematologic/lymphatic: No significant bruising, lymphadenopathy, abnormal bleeding Allergy/immunology: No itchy/watery eyes, significant sneezing, urticaria, angioedema  Physical exam:  Pertinent or  positive findings: General appearance: Adequately nourished; no acute distress, increased work of breathing is present.   Lymphatic: No lymphadenopathy about the head, neck, axilla. Eyes: No conjunctival inflammation or lid edema is present. There is no scleral icterus. Ears:  External ear exam shows no significant lesions or deformities.   Nose:  External nasal examination shows no deformity or inflammation. Nasal mucosa are pink and moist without lesions, exudates Oral exam:  Lips and gums are healthy appearing. There is no oropharyngeal erythema or exudate. Neck:  No thyromegaly, masses, tenderness noted.    Heart:  Normal rate and regular rhythm. S1 and S2 normal without gallop, murmur, click, rub .  Lungs: Chest clear to auscultation without wheezes, rhonchi, rales, rubs. Abdomen: Bowel sounds are normal. Abdomen is soft and nontender with no organomegaly, hernias, masses. GU: Deferred  Extremities:  No cyanosis, clubbing, edema  Neurologic exam : Cn 2-7 intact Strength equal  in upper & lower extremities Balance, Rhomberg, finger to nose testing could not be completed due to clinical state Deep tendon reflexes are equal Skin: Warm & dry w/o tenting. No significant lesions or rash.  See summary under each active problem in the Problem List with associated updated therapeutic plan

## 2020-04-18 NOTE — Assessment & Plan Note (Addendum)
Nephrology consultation appreciated. Essential goals are comfort and dignity. In that vein,Dr Theador Hawthorne & I had discussed the book Being Mortal by Dr Eda Keys when he made in person SNF visit 2/2.

## 2020-04-19 ENCOUNTER — Observation Stay (HOSPITAL_COMMUNITY)
Admission: EM | Admit: 2020-04-19 | Discharge: 2020-04-20 | Disposition: A | Discharge status: Home / Self Care | Payer: Medicare Other | Attending: Family Medicine | Admitting: Family Medicine

## 2020-04-19 ENCOUNTER — Other Ambulatory Visit: Payer: Self-pay

## 2020-04-19 DIAGNOSIS — Z16341 Resistance to single antimycobacterial drug: Secondary | ICD-10-CM | POA: Diagnosis not present

## 2020-04-19 DIAGNOSIS — Z79899 Other long term (current) drug therapy: Secondary | ICD-10-CM | POA: Diagnosis not present

## 2020-04-19 DIAGNOSIS — Z87820 Personal history of traumatic brain injury: Secondary | ICD-10-CM | POA: Diagnosis not present

## 2020-04-19 DIAGNOSIS — Z20822 Contact with and (suspected) exposure to covid-19: Secondary | ICD-10-CM | POA: Diagnosis not present

## 2020-04-19 DIAGNOSIS — F0390 Unspecified dementia without behavioral disturbance: Secondary | ICD-10-CM | POA: Insufficient documentation

## 2020-04-19 DIAGNOSIS — R531 Weakness: Secondary | ICD-10-CM

## 2020-04-19 DIAGNOSIS — D649 Anemia, unspecified: Secondary | ICD-10-CM | POA: Diagnosis not present

## 2020-04-19 DIAGNOSIS — E119 Type 2 diabetes mellitus without complications: Secondary | ICD-10-CM | POA: Diagnosis not present

## 2020-04-19 DIAGNOSIS — E079 Disorder of thyroid, unspecified: Secondary | ICD-10-CM | POA: Diagnosis not present

## 2020-04-19 DIAGNOSIS — N390 Urinary tract infection, site not specified: Secondary | ICD-10-CM | POA: Diagnosis present

## 2020-04-19 DIAGNOSIS — I10 Essential (primary) hypertension: Secondary | ICD-10-CM | POA: Insufficient documentation

## 2020-04-19 DIAGNOSIS — Z7984 Long term (current) use of oral hypoglycemic drugs: Secondary | ICD-10-CM | POA: Insufficient documentation

## 2020-04-19 DIAGNOSIS — I249 Acute ischemic heart disease, unspecified: Secondary | ICD-10-CM | POA: Diagnosis not present

## 2020-04-19 LAB — URINE CULTURE: Culture: >=100000 — AB

## 2020-04-19 LAB — CBG MONITORING, ED: Glucose-Capillary: 96 mg/dL (ref 70–99)

## 2020-04-19 MED ORDER — SODIUM CHLORIDE 0.9 % IV BOLUS
1000.0000 mL | Freq: Once | INTRAVENOUS | Status: AC
  Administered 2020-04-20: 1000 mL via INTRAVENOUS

## 2020-04-19 MED ORDER — SODIUM CHLORIDE 0.9 % IV SOLN
3.0000 g | Freq: Once | INTRAVENOUS | Status: AC
  Administered 2020-04-20: 3 g via INTRAVENOUS
  Filled 2020-04-19: qty 8

## 2020-04-19 NOTE — Assessment & Plan Note (Signed)
BP up off Amlodipine Discuss best options with Dr Theador Hawthorne

## 2020-04-19 NOTE — ED Provider Notes (Signed)
[facility] EMERGENCY DEPARTMENT Provider Note   CSN: [id number] Arrival date & time: 04/19/20  2201     History Chief Complaint  Patient presents with  . Medication Dose Change    Currently being treated for UTI, medication did not fix infection, medication should be changed.    Regina Bush is a 87 y.o. female.  Patient with advanced dementia from nursing home presents with chief concern for dehydration and unresponsive urinary tract infection.  She was recently seen in the ER a few days ago and diagnosed with a UTI and sent home with Keflex.  Cultures returned as sensitive to ampicillin and nitrofurantoin.  Patient son-in-law who is a physician is concerned that the patient needs IV ampicillin and appears more dehydrated.  Nursing home states that the patient has not had any fevers.  And has remained unchanged and clinicals status.  She is on 2 L nasal cannula intermittently as needed per nursing home.  No reports of fevers or vomiting or cough or diarrhea per nursing home.        Past Medical History:  Diagnosis Date  . Anemia   . Arthritis   . Cerebral infarction ([place])   . Coronary artery disease   . Dementia ([place])   . Diabetes mellitus without complication (HCC)    Type 2  . Dysphagia due to recent cerebral infarction   . Failure to thrive (0-17)   . Hypercalcemia   . Hypertension   . Pneumonitis   . Protein calorie malnutrition ([place])   . Subdural hematoma ([place])   . Thyroid disease     Patient Active Problem List   Diagnosis Date Noted  . Vascular dementia with behavior disturbance ([place]) 04/14/2020  . Elevated uric acid in blood 04/02/2020  . Anemia due to stage 5 chronic kidney disease ([place]) 03/13/2020  . Dysphagia 03/11/2020  . Failure to thrive in adult 03/10/2020  . Pulmonary edema with congestive heart failure ([place]) 03/10/2020  . Protein-calorie malnutrition, severe ([place]) 02/27/2020  . Type 2 diabetes mellitus with stage 4 chronic kidney disease and  hypertension ([place]) 02/25/2020  . CKD stage 5 due to type 2 diabetes mellitus ([place]) 02/25/2020  . Aortic atherosclerosis ([place]) 02/25/2020  . Chronic interstitial lung disease ([place]) 02/25/2020  . Chronic constipation 02/25/2020  . Vascular dementia without behavioral disturbance ([place]) 02/25/2020  . Lower urinary tract symptoms (LUTS) 02/25/2020  . Chronic non-seasonal allergic rhinitis 02/25/2020  . Retinopathy 02/25/2020  . Hyperlipidemia associated with type 2 diabetes mellitus ([place]) 02/25/2020  . Weakness 04/19/2019  . DNR (do not resuscitate) 03/21/2019  . Goals of care, counseling/discussion   . Palliative care by specialist   . Dyspnea   . Hyperkalemia   . Shortness of breath   . Acute respiratory failure with hypoxia ([place]) 03/19/2019  . AKI (acute kidney injury) ([place]) 03/18/2019  . Urinary retention 02/20/2018  . Hypothyroidism 11/19/2016  . History of CVA in adulthood 11/19/2016  . UTI (urinary tract infection) 11/15/2016  . ARF (acute renal failure) ([place]) 11/15/2016  . Hyponatremia 11/15/2016  . HCAP (healthcare-associated pneumonia) 08/19/2016  . Subdural hematoma ([place])   . Essential hypertension   . Coronary artery disease     No past surgical history on file.   OB History   No obstetric history on file.     Family History  Family history unknown: Yes    Social History   Tobacco Use  . Smoking status: Never Smoker  . Smokeless tobacco: Never Used  Vaping  Use  . Vaping Use: Never used  Substance Use Topics  . Alcohol use: No  . Drug use: No    Home Medications Prior to Admission medications   Medication Sig Start Date End Date Taking? Authorizing Provider  allopurinol (ZYLOPRIM) 100 MG tablet Take 100 mg by mouth daily. 03/25/20   [provider]  Amino Acids-Protein Hydrolys (FEEDING SUPPLEMENT, PRO-STAT SUGAR FREE 64,) LIQD Take 30 mLs by mouth 3 (three) times daily with meals. 02/25/20   [provider]  amLODipine (NORVASC) 5 MG  tablet Take 5 mg by mouth daily.    [provider]  carboxymethylcellulose (REFRESH PLUS) 0.5 % SOLN 1 drop in the morning, at noon, and at bedtime.    [provider]  cephALEXin (KEFLEX) 500 MG capsule Take 1 capsule (500 mg total) by mouth 4 (four) times daily. 04/16/20   Fredia Sorrow, MD  cetirizine (ZYRTEC) 10 MG tablet Take 10 mg by mouth daily as needed for allergies. 02/22/20   [provider]  famotidine (PEPCID) 20 MG tablet Take 20 mg by mouth daily. 02/22/20   [provider]  fluticasone (FLONASE) 50 MCG/ACT nasal spray Place 2 sprays into both nostrils daily as needed.    [provider]  Fluticasone-Salmeterol (ADVAIR) 100-50 MCG/DOSE AEPB Inhale 1 puff into the lungs 2 (two) times daily. 03/03/20   [provider]  ipratropium-albuterol (DUONEB) 0.5-2.5 (3) MG/3ML SOLN Take 3 mLs by nebulization every 6 (six) hours as needed. 02/22/20   [provider]  LORazepam (ATIVAN) 2 MG tablet Take 0.5 mg by mouth every 6 (six) hours as needed for anxiety. 04/11/20   [provider]  magnesium oxide (MAG-OX) 400 MG tablet Take 400 mg by mouth daily.     [provider]  NON FORMULARY Diet Change: Regular (NAS), thin liquids 03/18/20   [provider]  NP THYROID 90 MG tablet Take 1 tablet (90 mg total) by mouth daily. 12/05/19   Doree Albee, MD  oxyCODONE (ROXICODONE INTENSOL) 20 MG/ML concentrated solution Take 5 mg by mouth every 6 (six) hours as needed for severe pain. 03/04/20   [provider]  OXYGEN Inhale 2 L into the lungs.    [provider]  rOPINIRole (REQUIP) 0.5 MG tablet Take 0.5 mg by mouth at bedtime. 02/22/20   [provider]  sodium zirconium cyclosilicate (LOKELMA) 10 g PACK packet Take 10 g by mouth daily.    [provider]  tamsulosin (FLOMAX) 0.4 MG CAPS capsule Take 0.4 mg by mouth every evening.    [provider]  traZODone  (DESYREL) 50 MG tablet Take 50 mg by mouth at bedtime. 02/22/20   [provider]  trimethoprim (TRIMPEX) 100 MG tablet Take 100 mg by mouth daily. For UTI 02/22/20   [provider]    Allergies    Duloxetine hcl, Morphine and related, Other, Tape, and Venlafaxine  Review of Systems   Review of Systems  Unable to perform ROS: Dementia    Physical Exam Updated Vital Signs BP (!) 174/50   Pulse (!) 56   Temp 97.6 F (36.4 C) (Oral)   Resp 16   SpO2 97%   Physical Exam Constitutional:      General: She is not in acute distress.    Appearance: Normal appearance.  HENT:     Head: Normocephalic.     Nose: Nose normal.  Eyes:     Extraocular Movements: Extraocular movements intact.  Cardiovascular:  Rate and Rhythm: Normal rate.  Pulmonary:     Effort: Pulmonary effort is normal.  Musculoskeletal:        General: Normal range of motion.     Cervical back: Normal range of motion.  Neurological:     General: No focal deficit present.     Mental Status: She is alert. Mental status is at baseline.     ED Results / Procedures / Treatments   Labs (all labs ordered are listed, but only abnormal results are displayed) Labs Reviewed  URINE CULTURE  URINALYSIS, ROUTINE W REFLEX MICROSCOPIC  CBC WITH DIFFERENTIAL/PLATELET  BASIC METABOLIC PANEL  LACTIC ACID, PLASMA  LACTIC ACID, PLASMA  CBG MONITORING, ED    EKG None  Radiology No results found.  Procedures Procedures   Medications Ordered in ED Medications  sodium chloride 0.9 % bolus 1,000 mL (has no administration in time range)    ED Course  I have reviewed the triage vital signs and the nursing notes.  Pertinent labs & imaging results that were available during my care of the patient were reviewed by me and considered in my medical decision making (see chart for details).  Clinical Course as of 04/19/20 0355  Sat Apr 19, 2020  2334 Son in Sports coach is physician, phone number:  [phone] [JH]    Clinical Course User Index [JH] Almyra Free Greggory Brandy, MD   MDM Rules/Calculators/A&P                          Basic labs and urinalysis sent again IV fluids ordered.   Final Clinical Impression(s) / ED Diagnoses Final diagnoses:  None    Rx / DC Orders ED Discharge Orders    None       Luna Fuse, MD 04/19/20 2336

## 2020-04-19 NOTE — ED Notes (Signed)
POC blood glucose obtained at 96. Pt placed on [facility] which is baseline per documentation. Pt alert and oriented to self and situation, disoriented to time and place,hx dementia. Warm blankets provided, nonskid socks placed, resting in bed, NAD noted.

## 2020-04-20 ENCOUNTER — Inpatient Hospital Stay
Admission: RE | Admit: 2020-04-20 | Discharge: 2020-05-13 | Disposition: E | Payer: Medicare Other | Source: Ambulatory Visit | Attending: Internal Medicine | Admitting: Internal Medicine

## 2020-04-20 ENCOUNTER — Encounter (HOSPITAL_COMMUNITY): Payer: Self-pay | Admitting: Family Medicine

## 2020-04-20 ENCOUNTER — Other Ambulatory Visit: Payer: Self-pay

## 2020-04-20 DIAGNOSIS — N390 Urinary tract infection, site not specified: Secondary | ICD-10-CM | POA: Diagnosis not present

## 2020-04-20 DIAGNOSIS — N3001 Acute cystitis with hematuria: Secondary | ICD-10-CM | POA: Diagnosis not present

## 2020-04-20 LAB — CBC WITH DIFFERENTIAL/PLATELET
Abs Immature Granulocytes: 0.08 10*3/uL — ABNORMAL HIGH (ref 0.00–0.07)
Basophils Absolute: 0 10*3/uL (ref 0.0–0.1)
Basophils Relative: 0 %
Eosinophils Absolute: 0.3 10*3/uL (ref 0.0–0.5)
Eosinophils Relative: 3 %
HCT: 31 % — ABNORMAL LOW (ref 36.0–46.0)
Hemoglobin: 9.3 g/dL — ABNORMAL LOW (ref 12.0–15.0)
Immature Granulocytes: 1 %
Lymphocytes Relative: 8 %
Lymphs Abs: 0.6 10*3/uL — ABNORMAL LOW (ref 0.7–4.0)
MCH: 31.2 pg (ref 26.0–34.0)
MCHC: 30 g/dL (ref 30.0–36.0)
MCV: 104 fL — ABNORMAL HIGH (ref 80.0–100.0)
Monocytes Absolute: 0.5 10*3/uL (ref 0.1–1.0)
Monocytes Relative: 7 %
Neutro Abs: 6.3 10*3/uL (ref 1.7–7.7)
Neutrophils Relative %: 81 %
Platelets: 230 10*3/uL (ref 150–400)
RBC: 2.98 MIL/uL — ABNORMAL LOW (ref 3.87–5.11)
RDW: 14.3 % (ref 11.5–15.5)
WBC: 7.8 10*3/uL (ref 4.0–10.5)
nRBC: 0 % (ref 0.0–0.2)

## 2020-04-20 LAB — URINALYSIS, ROUTINE W REFLEX MICROSCOPIC
Bilirubin Urine: NEGATIVE
Glucose, UA: NEGATIVE mg/dL
Ketones, ur: NEGATIVE mg/dL
Nitrite: NEGATIVE
Protein, ur: 100 mg/dL — AB
Specific Gravity, Urine: >1.03 — ABNORMAL HIGH (ref 1.005–1.030)
pH: 5.5 (ref 5.0–8.0)

## 2020-04-20 LAB — CBC
HCT: 30.6 % — ABNORMAL LOW (ref 36.0–46.0)
Hemoglobin: 9.1 g/dL — ABNORMAL LOW (ref 12.0–15.0)
MCH: 31.5 pg (ref 26.0–34.0)
MCHC: 29.7 g/dL — ABNORMAL LOW (ref 30.0–36.0)
MCV: 105.9 fL — ABNORMAL HIGH (ref 80.0–100.0)
Platelets: 206 10*3/uL (ref 150–400)
RBC: 2.89 MIL/uL — ABNORMAL LOW (ref 3.87–5.11)
RDW: 14.3 % (ref 11.5–15.5)
WBC: 7.2 10*3/uL (ref 4.0–10.5)
nRBC: 0 % (ref 0.0–0.2)

## 2020-04-20 LAB — BASIC METABOLIC PANEL
Anion gap: 8 (ref 5–15)
Anion gap: 8 (ref 5–15)
BUN: 53 mg/dL — ABNORMAL HIGH (ref 8–23)
BUN: 57 mg/dL — ABNORMAL HIGH (ref 8–23)
CO2: 22 mmol/L (ref 22–32)
CO2: 22 mmol/L (ref 22–32)
Calcium: 9.3 mg/dL (ref 8.9–10.3)
Calcium: 9.6 mg/dL (ref 8.9–10.3)
Chloride: 110 mmol/L (ref 98–111)
Chloride: 112 mmol/L — ABNORMAL HIGH (ref 98–111)
Creatinine, Ser: 3.33 mg/dL — ABNORMAL HIGH (ref 0.44–1.00)
Creatinine, Ser: 3.44 mg/dL — ABNORMAL HIGH (ref 0.44–1.00)
GFR, Estimated: 12 mL/min — ABNORMAL LOW (ref >60)
GFR, Estimated: 13 mL/min — ABNORMAL LOW (ref >60)
Glucose, Bld: 104 mg/dL — ABNORMAL HIGH (ref 70–99)
Glucose, Bld: 95 mg/dL (ref 70–99)
Potassium: 4.9 mmol/L (ref 3.5–5.1)
Potassium: 5.2 mmol/L — ABNORMAL HIGH (ref 3.5–5.1)
Sodium: 140 mmol/L (ref 135–145)
Sodium: 142 mmol/L (ref 135–145)

## 2020-04-20 LAB — URINALYSIS, MICROSCOPIC (REFLEX)

## 2020-04-20 LAB — SARS CORONAVIRUS 2 BY RT PCR (HOSPITAL ORDER, PERFORMED IN ~~LOC~~ HOSPITAL LAB): SARS Coronavirus 2: NEGATIVE

## 2020-04-20 LAB — LACTIC ACID, PLASMA
Lactic Acid, Venous: 0.8 mmol/L (ref 0.5–1.9)
Lactic Acid, Venous: 0.9 mmol/L (ref 0.5–1.9)

## 2020-04-20 LAB — MRSA PCR SCREENING: MRSA by PCR: POSITIVE — AB

## 2020-04-20 MED ORDER — ONDANSETRON HCL 4 MG PO TABS
4.0000 mg | ORAL_TABLET | Freq: Four times a day (QID) | ORAL | Status: DC | PRN
  Filled 2020-04-20: qty 1

## 2020-04-20 MED ORDER — IPRATROPIUM-ALBUTEROL 0.5-2.5 (3) MG/3ML IN SOLN: 3.0000 mL | Freq: Four times a day (QID) | RESPIRATORY_TRACT | Status: DC | PRN

## 2020-04-20 MED ORDER — HEPARIN SODIUM (PORCINE) 5000 UNIT/ML IJ SOLN
5000.0000 [IU] | Freq: Three times a day (TID) | INTRAMUSCULAR | Status: DC
  Administered 2020-04-20: 5000 [IU] via SUBCUTANEOUS
  Filled 2020-04-20: qty 1

## 2020-04-20 MED ORDER — PRO-STAT SUGAR FREE PO LIQD: 30.0000 mL | Freq: Three times a day (TID) | ORAL | Status: DC

## 2020-04-20 MED ORDER — ROPINIROLE HCL 0.5 MG PO TABS
0.5000 mg | ORAL_TABLET | Freq: Every day | ORAL | Status: DC
  Filled 2020-04-20: qty 1

## 2020-04-20 MED ORDER — TRAZODONE HCL 50 MG PO TABS
50.0000 mg | ORAL_TABLET | Freq: Every day | ORAL | Status: DC
  Filled 2020-04-20: qty 1

## 2020-04-20 MED ORDER — OXYCODONE HCL 20 MG/ML PO CONC: 5.0000 mg | Freq: Four times a day (QID) | ORAL | 0 refills | Status: DC | PRN

## 2020-04-20 MED ORDER — TAMSULOSIN HCL 0.4 MG PO CAPS
0.4000 mg | ORAL_CAPSULE | Freq: Every evening | ORAL | Status: DC
  Filled 2020-04-20: qty 1

## 2020-04-20 MED ORDER — FAMOTIDINE 20 MG PO TABS
20.0000 mg | ORAL_TABLET | Freq: Every day | ORAL | Status: DC
  Administered 2020-04-20: 20 mg via ORAL
  Filled 2020-04-20 (×2): qty 1

## 2020-04-20 MED ORDER — ACETAMINOPHEN 650 MG RE SUPP
650.0000 mg | Freq: Four times a day (QID) | RECTAL | Status: DC | PRN
  Filled 2020-04-20: qty 1

## 2020-04-20 MED ORDER — SODIUM CHLORIDE 0.9 % IV SOLN
3.0000 g | Freq: Two times a day (BID) | INTRAVENOUS | Status: DC
  Filled 2020-04-20 (×3): qty 8

## 2020-04-20 MED ORDER — ONDANSETRON HCL 4 MG/2ML IJ SOLN: 4.0000 mg | Freq: Four times a day (QID) | INTRAMUSCULAR | Status: DC | PRN

## 2020-04-20 MED ORDER — LORAZEPAM 0.5 MG PO TABS
0.5000 mg | ORAL_TABLET | Freq: Four times a day (QID) | ORAL | Status: DC | PRN
  Filled 2020-04-20: qty 1

## 2020-04-20 MED ORDER — THYROID 30 MG PO TABS
90.0000 mg | ORAL_TABLET | Freq: Every day | ORAL | Status: DC
  Administered 2020-04-20: 90 mg via ORAL
  Filled 2020-04-20 (×2): qty 3

## 2020-04-20 MED ORDER — THYROID 60 MG PO TABS
90.0000 mg | ORAL_TABLET | Freq: Every day | ORAL | Status: DC
  Administered 2020-04-20: 90 mg via ORAL
  Filled 2020-04-20 (×2): qty 1

## 2020-04-20 MED ORDER — ACETAMINOPHEN 325 MG PO TABS: 650.0000 mg | ORAL_TABLET | Freq: Four times a day (QID) | ORAL | Status: DC | PRN

## 2020-04-20 MED ORDER — FOSFOMYCIN TROMETHAMINE 3 G PO PACK
3.0000 g | PACK | Freq: Once | ORAL | Status: AC
  Administered 2020-04-20: 3 g via ORAL
  Filled 2020-04-20: qty 3

## 2020-04-20 MED ORDER — LORAZEPAM 0.5 MG PO TABS: 0.5000 mg | ORAL_TABLET | Freq: Four times a day (QID) | ORAL | 0 refills | Status: AC | PRN

## 2020-04-20 MED ORDER — SODIUM ZIRCONIUM CYCLOSILICATE 10 G PO PACK
10.0000 g | PACK | Freq: Every day | ORAL | Status: DC
  Administered 2020-04-20: 10 g via ORAL
  Filled 2020-04-20 (×2): qty 1

## 2020-04-20 MED ORDER — TRIMETHOPRIM 100 MG PO TABS: 100.0000 mg | ORAL_TABLET | Freq: Every day | ORAL | Status: DC

## 2020-04-20 MED ORDER — FLUTICASONE FUROATE-VILANTEROL 100-25 MCG/INH IN AEPB
1.0000 | INHALATION_SPRAY | Freq: Every day | RESPIRATORY_TRACT | Status: DC
  Administered 2020-04-20: 1 via RESPIRATORY_TRACT
  Filled 2020-04-20 (×2): qty 28

## 2020-04-20 MED ORDER — PROSOURCE PLUS PO LIQD
30.0000 mL | Freq: Three times a day (TID) | ORAL | Status: DC
  Filled 2020-04-20 (×5): qty 30

## 2020-04-20 MED ORDER — OXYCODONE HCL 20 MG/ML PO CONC
5.0000 mg | Freq: Four times a day (QID) | ORAL | Status: DC | PRN
  Filled 2020-04-20: qty 1

## 2020-04-20 NOTE — Progress Notes (Signed)
PHARMACY NOTE:  ANTIMICROBIAL RENAL DOSAGE ADJUSTMENT  Current antimicrobial regimen includes a mismatch between antimicrobial dosage and estimated renal function.  As per policy approved by the Pharmacy & Therapeutics and Medical Executive Committees, the antimicrobial dosage will be adjusted accordingly.  Current antimicrobial dosage:  Unasyn 3gm IV q8h  Indication: UTI Renal Function:   Estimated Creatinine Clearance: 10.4 mL/min (A) (by C-G formula based on SCr of 3.33 mg/dL (H)). []      On intermittent HD, scheduled: []      On CRRT    Antimicrobial dosage has been changed to: Unasyn 3gm IV q12h Additional comments:   Thank you for allowing pharmacy to be a part of this patient's care.  Netta Cedars, [facility] 04/15/2020 5:52 AM

## 2020-04-20 NOTE — H&P (Signed)
TRH H&P    Patient Demographics:    Regina Bush, is a 87 y.o. female  MRN: [id number]  DOB - 1928/03/13  Admit Date - 04/19/2020  Referring MD/NP/PA: Betsey Holiday  Outpatient Primary MD for the patient is Doree Albee, MD  Patient coming from: Home  Chief complaint- UTI   HPI:    Regina Bush  is a 87 y.o. female, with history of thyroid disease, subdural hematoma, protein calorie malnutrition, diabetes mellitus type 2, coronary artery disease, and more presents the ED with a chief complaint of UTI.  Patient was diagnosed with UTI in the outpatient setting.  She was started on Keflex.  Urine culture grew back Enterococcus.  Her son-in-law is a physician in town and brought her into the ED for IV antibiotics.  Patient is not able to give any history at all.  She does not know why she is here, where she is, or what year it is.  Son-in-law no longer at bedside at the time of my exam.  In the ED Temp 97.6, heart rate 55-60, blood pressure 174/50, 2 L nasal cannula, 97% Leukocytosis 7.8, hemoglobin 9.3 Creatinine elevated but at baseline 3.44 Potassium elevated - chronically due to CKD - at 5.2 Blood cultures, Urine cultures pending Unasyn started, patient given 1L NaCl bolus    Review of systems:    Review of Systems  Unable to perform ROS: Dementia       Past History of the following :    Past Medical History:  Diagnosis Date  . Anemia   . Arthritis   . Cerebral infarction ([place])   . Coronary artery disease   . Dementia ([place])   . Diabetes mellitus without complication (HCC)    Type 2  . Dysphagia due to recent cerebral infarction   . Failure to thrive (0-17)   . Hypercalcemia   . Hypertension   . Pneumonitis   . Protein calorie malnutrition ([place])   . Subdural hematoma ([place])   . Thyroid disease       No past surgical history on file.    Social History:      Social History    Tobacco Use  . Smoking status: Never Smoker  . Smokeless tobacco: Never Used  Substance Use Topics  . Alcohol use: No       Family History :     Family History  Family history unknown: Yes   Family history could not be reviewed 2/2 dementia   Home Medications:   Prior to Admission medications   Medication Sig Start Date End Date Taking? Authorizing Provider  allopurinol (ZYLOPRIM) 100 MG tablet Take 100 mg by mouth daily. 03/25/20   [provider]  Amino Acids-Protein Hydrolys (FEEDING SUPPLEMENT, PRO-STAT SUGAR FREE 64,) LIQD Take 30 mLs by mouth 3 (three) times daily with meals. 02/25/20   [provider]  amLODipine (NORVASC) 5 MG tablet Take 5 mg by mouth daily.    [provider]  carboxymethylcellulose (REFRESH PLUS) 0.5 % SOLN 1 drop in the morning, at noon,  and at bedtime.    [provider]  cephALEXin (KEFLEX) 500 MG capsule Take 1 capsule (500 mg total) by mouth 4 (four) times daily. 04/16/20   Fredia Sorrow, MD  cetirizine (ZYRTEC) 10 MG tablet Take 10 mg by mouth daily as needed for allergies. 02/22/20   [provider]  famotidine (PEPCID) 20 MG tablet Take 20 mg by mouth daily. 02/22/20   [provider]  fluticasone (FLONASE) 50 MCG/ACT nasal spray Place 2 sprays into both nostrils daily as needed.    [provider]  Fluticasone-Salmeterol (ADVAIR) 100-50 MCG/DOSE AEPB Inhale 1 puff into the lungs 2 (two) times daily. 03/03/20   [provider]  ipratropium-albuterol (DUONEB) 0.5-2.5 (3) MG/3ML SOLN Take 3 mLs by nebulization every 6 (six) hours as needed. 02/22/20   [provider]  LORazepam (ATIVAN) 2 MG tablet Take 0.5 mg by mouth every 6 (six) hours as needed for anxiety. 04/11/20   [provider]  magnesium oxide (MAG-OX) 400 MG tablet Take 400 mg by mouth daily.     [provider]  NON FORMULARY Diet Change: Regular (NAS), thin liquids 03/18/20   [provider]  NP THYROID 90 MG tablet Take 1 tablet (90 mg total) by mouth daily. 12/05/19   Doree Albee, MD  oxyCODONE (ROXICODONE INTENSOL) 20 MG/ML concentrated solution Take 5 mg by mouth every 6 (six) hours as needed for severe pain. 03/04/20   [provider]  OXYGEN Inhale 2 L into the lungs.    [provider]  rOPINIRole (REQUIP) 0.5 MG tablet Take 0.5 mg by mouth at bedtime. 02/22/20   [provider]  sodium zirconium cyclosilicate (LOKELMA) 10 g PACK packet Take 10 g by mouth daily.    [provider]  tamsulosin (FLOMAX) 0.4 MG CAPS capsule Take 0.4 mg by mouth every evening.    [provider]  traZODone (DESYREL) 50 MG tablet Take 50 mg by mouth at bedtime. 02/22/20   [provider]  trimethoprim (TRIMPEX) 100 MG tablet Take 100 mg by mouth daily. For UTI 02/22/20   [provider]     Allergies:     Allergies  Allergen Reactions  . Duloxetine Hcl Other (See Comments)    unknown unknown  . Morphine And Related Other (See Comments)    Passed out  Passed out   . Other     adhesive  . Tape     adhesive  . Venlafaxine Other (See Comments)    unknown unknown     Physical Exam:   Vitals  Blood pressure (!) 174/50, pulse (!) 56, temperature 97.6 F (36.4 C), temperature source Oral, resp. rate 16, SpO2 97 %.  1.  General: Resting comfortably, no acute distress  2. Psychiatric: Mood and behavior normal for situation  3. Neurologic: Chronic right eyelid droop, no acute focal deficits on limited exam  4. HEENMT:  Head is Atraumatic and normocephalic, pupils reactive, neck supple, trachea is midline  5. Respiratory : LCTABL without crackles, wheezes, or rhonchi  6. Cardiovascular : Heart rate bradycardic, rhythm regular, systolic murmur, no rubs or gallops, peripheral pulses intact  7. Gastrointestinal:  Abdomen soft, non distended, non tender to palpation, normal bowel sounds, no  palpable masses  8. Skin:  Skin is warm, dry and intact without acute lesions on limited exam  9.Musculoskeletal:  No acute deformities, peripheral pulses palpated, no calf tenderness    Data Review:    CBC Recent Labs  Lab 04/16/20 1945 04/22/2020 0001  WBC 7.8 7.8  HGB 10.4* 9.3*  HCT 35.6* 31.0*  PLT 255 230  MCV 106.9* 104.0*  MCH 31.2 31.2  MCHC 29.2* 30.0  RDW 14.6 14.3  LYMPHSABS 1.4 0.6*  MONOABS 0.6 0.5  EOSABS 0.4 0.3  BASOSABS 0.0 0.0   ------------------------------------------------------------------------------------------------------------------  Results for orders placed or performed during the hospital encounter of 04/19/20 (from the past 48 hour(s))  CBG monitoring, ED     Status: None   Collection Time: 04/19/20 10:23 PM  Result Value Ref Range   Glucose-Capillary 96 70 - 99 mg/dL    Comment: Glucose reference range applies only to samples taken after fasting for at least 8 hours.  Urinalysis, Routine w reflex microscopic Urine, Catheterized     Status: Abnormal   Collection Time: 04/19/20 11:36 PM  Result Value Ref Range   Color, Urine YELLOW YELLOW   APPearance CLOUDY (A) CLEAR   Specific Gravity, Urine >1.030 (H) 1.005 - 1.030   pH 5.5 5.0 - 8.0   Glucose, UA NEGATIVE NEGATIVE mg/dL   Hgb urine dipstick LARGE (A) NEGATIVE   Bilirubin Urine NEGATIVE NEGATIVE   Ketones, ur NEGATIVE NEGATIVE mg/dL   Protein, ur 100 (A) NEGATIVE mg/dL   Nitrite NEGATIVE NEGATIVE   Leukocytes,Ua MODERATE (A) NEGATIVE    Comment: Performed at [facility], [address], [place], [place] [address]  Urinalysis, Microscopic (reflex)     Status: Abnormal   Collection Time: 04/19/20 11:36 PM  Result Value Ref Range   RBC / HPF 0-5 0 - 5 RBC/hpf   WBC, UA 6-10 0 - 5 WBC/hpf   Bacteria, UA FEW (A) NONE SEEN   Squamous Epithelial / LPF 6-10 0 - 5   Non Squamous Epithelial PRESENT (A) NONE SEEN    Comment: Performed at [facility], [address], [place], [place]  [address]  CBC with Differential     Status: Abnormal   Collection Time: 05/12/2020 12:01 AM  Result Value Ref Range   WBC 7.8 4.0 - 10.5 K/uL   RBC 2.98 (L) 3.87 - 5.11 MIL/uL   Hemoglobin 9.3 (L) 12.0 - 15.0 g/dL   HCT 31.0 (L) 36.0 - 46.0 %   MCV 104.0 (H) 80.0 - 100.0 fL   MCH 31.2 26.0 - 34.0 pg   MCHC 30.0 30.0 - 36.0 g/dL   RDW 14.3 11.5 - 15.5 %   Platelets 230 150 - 400 K/uL   nRBC 0.0 0.0 - 0.2 %   Neutrophils Relative % 81 %   Neutro Abs 6.3 1.7 - 7.7 K/uL   Lymphocytes Relative 8 %   Lymphs Abs 0.6 (L) 0.7 - 4.0 K/uL   Monocytes Relative 7 %   Monocytes Absolute 0.5 0.1 - 1.0 K/uL   Eosinophils Relative 3 %   Eosinophils Absolute 0.3 0.0 - 0.5 K/uL   Basophils Relative 0 %   Basophils Absolute 0.0 0.0 - 0.1 K/uL   Immature Granulocytes 1 %   Abs Immature Granulocytes 0.08 (H) 0.00 - 0.07 K/uL    Comment: Performed at [facility], [address], [place], [place] [address]  Basic metabolic panel     Status: Abnormal   Collection Time: 05/01/2020 12:01 AM  Result Value Ref Range   Sodium 140 135 - 145 mmol/L   Potassium 5.2 (H) 3.5 - 5.1 mmol/L   Chloride 110 98 - 111 mmol/L   CO2 22 22 - 32 mmol/L   Glucose, Bld 104 (H)  70 - 99 mg/dL    Comment: Glucose reference range applies only to samples taken after fasting for at least 8 hours.   BUN 57 (H) 8 - 23 mg/dL   Creatinine, Ser 3.44 (H) 0.44 - 1.00 mg/dL   Calcium 9.6 8.9 - 10.3 mg/dL   GFR, Estimated 12 (L) >60 mL/min    Comment: (NOTE) Calculated using the CKD-EPI Creatinine Equation (2021)    Anion gap 8 5 - 15    Comment: Performed at [facility], [address], [place], [place] [address]  Lactic acid, plasma     Status: None   Collection Time: 05/05/2020 12:01 AM  Result Value Ref Range   Lactic Acid, Venous 0.8 0.5 - 1.9 mmol/L    Comment: Performed at [facility], [address], [place], [place] [address]    Chemistries  Recent Labs  Lab 04/16/20 1945 04/19/2020 0001  NA 145 140  K 5.3* 5.2*  CL 111  110  CO2 24 22  GLUCOSE 98 104*  BUN 74* 57*  CREATININE 5.61* 3.44*  CALCIUM 9.7 9.6   ------------------------------------------------------------------------------------------------------------------  ------------------------------------------------------------------------------------------------------------------ GFR: Estimated Creatinine Clearance: 10 mL/min (A) (by C-G formula based on SCr of 3.44 mg/dL (H)). Liver Function Tests: No results for input(s): AST, ALT, ALKPHOS, BILITOT, PROT, ALBUMIN in the last 168 hours. No results for input(s): LIPASE, AMYLASE in the last 168 hours. No results for input(s): AMMONIA in the last 168 hours. Coagulation Profile: No results for input(s): INR, PROTIME in the last 168 hours. Cardiac Enzymes: No results for input(s): CKTOTAL, CKMB, CKMBINDEX, TROPONINI in the last 168 hours. BNP (last 3 results) No results for input(s): PROBNP in the last 8760 hours. HbA1C: No results for input(s): HGBA1C in the last 72 hours. CBG: Recent Labs  Lab 04/19/20 2223  GLUCAP 96   Lipid Profile: No results for input(s): CHOL, HDL, LDLCALC, TRIG, CHOLHDL, LDLDIRECT in the last 72 hours. Thyroid Function Tests: No results for input(s): TSH, T4TOTAL, FREET4, T3FREE, THYROIDAB in the last 72 hours. Anemia Panel: No results for input(s): VITAMINB12, FOLATE, FERRITIN, TIBC, IRON, RETICCTPCT in the last 72 hours.  --------------------------------------------------------------------------------------------------------------- Urine analysis:    Component Value Date/Time   COLORURINE YELLOW 04/19/2020 2336   APPEARANCEUR CLOUDY (A) 04/19/2020 2336   LABSPEC >1.030 (H) 04/19/2020 2336   PHURINE 5.5 04/19/2020 2336   GLUCOSEU NEGATIVE 04/19/2020 2336   HGBUR LARGE (A) 04/19/2020 2336   BILIRUBINUR NEGATIVE 04/19/2020 2336   Ona 04/19/2020 2336   PROTEINUR 100 (A) 04/19/2020 2336   NITRITE NEGATIVE 04/19/2020 2336   LEUKOCYTESUR MODERATE  (A) 04/19/2020 2336      Imaging Results:    No results found.  My personal review of EKG: Rhythm SB, Rate 51 /min, QTc 488,no Acute ST changes   Assessment & Plan:    Active Problems:   UTI (urinary tract infection)   1. UTI 1. Started on kephlex out pt 2. Urine culture grew enterococcus 3. UA improved from outpt UA - today large hgb, few bacteria, and 6-10 WBC 4. Started on Unasyn 5. No SIRS criteria 6. Son wanted patient observed overnight 7. Repeat Urine culture, and blood culture pending 8. 1L NaCl bolus in the ED 2. HTN 1. Amlodipine DC'ed on 04/16/20 2. BP in the ED 174/50 3. PRN hydralazine 3. CKD stage 5 1. Continue lokelma 2. K+ mildly elevated at 5.2 3.  4.  5.    DVT Prophylaxis-   heparin - SCDs   AM Labs Ordered, also  please review Full Orders  Family Communication: No family at bedside Code Status:  DNR  Admission status: Observation Time spent in minutes : [place]

## 2020-04-20 NOTE — ED Provider Notes (Signed)
Case signed out to me by Dr. Almyra Free with work-up pending.  Patient recently diagnosed with UTI, started on Keflex.  Culture has grown Enterococcus, question efficacy of Keflex.  Repeat urinalysis, however does appear improved but not cleared.  Blood work is reassuring, no leukocytosis, normal lactic acid.  Patient has been afebrile.  Discussed with her son-in-law, Dr. Karie Kirks.  He does not feel the patient is well enough for discharge, requesting observation for additional IV antibiotics.   Orpah Greek, MD 04/16/2020 [id number]

## 2020-04-20 NOTE — Progress Notes (Signed)
Report given to brittany at [facility] center. Patient to d/c back.

## 2020-04-20 NOTE — TOC Transition Note (Signed)
Transition of Care [facility]) - CM/SW Discharge Note   Patient Details  Name: Regina Bush MRN: [id number] Date of Birth: 01-02-1928  [facility] CM/SW Contact:  Natasha Bence, LCSW Phone Number: 05/02/2020, 1:16 PM   Clinical Narrative:    CSW confirmed [facility] is able to take patient back on 02/06. CSW notified nurse. Nurse to call report. CSW faxed d/c summary. TOC signing off.   Final next level of care: Long Term Acute Care (LTAC) Barriers to Discharge: Barriers Resolved   Patient Goals and CMS Choice Patient states their goals for this hospitalization and ongoing recovery are:: Return to LTC with [facility] CMS Medicare.gov Compare Post Acute Care list provided to:: Patient Choice offered to / list presented to : Patient  Discharge Placement              Patient chooses bed at: [facility] Patient to be transferred to facility by: [facility] Name of family member notified: Jacquelyne Balint Patient and family notified of of transfer: 05/07/2020  Discharge Plan and Services                DME Arranged: N/A,Oxygen DME Agency: NA       HH Arranged: NA [place] Agency: NA        Social Determinants of Health ([place]) Interventions     Readmission Risk Interventions Readmission Risk Prevention Plan 02/06/2020  Transportation Screening Complete  Medication Review (RN CM) Complete  Some recent data might be hidden

## 2020-04-20 NOTE — Discharge Summary (Signed)
Physician Discharge Summary  Regina Bush OZH:[id number] DOB: Jun 13, 1927 DOA: 04/19/2020  PCP: Doree Albee, MD  Admit date: 04/19/2020 Discharge date: 05/03/2020  Admitted From:  [facility]  Disposition:  [facility]   Recommendations for Outpatient Follow-up:  1. Follow up with PCP in 2 weeks 2. Please be sure patient takes her lokelma on a daily basis.   Discharge Condition: SNF   CODE STATUS: DNR    Brief Hospitalization Summary: Please see all hospital notes, images, labs for full details of the hospitalization. ADMISSION HPI:  Regina Bush  is a 87 y.o. female, with history of thyroid disease, subdural hematoma, protein calorie malnutrition, diabetes mellitus type 2, coronary artery disease, and more presents the ED with a chief complaint of UTI.  Patient was diagnosed with UTI in the outpatient setting.  She was started on Keflex.  Urine culture grew back Enterococcus.  Her son-in-law is a physician in town and brought her into the ED for IV antibiotics.  Patient is not able to give any history at all.  She does not know why she is here, where she is, or what year it is.  Son-in-law no longer at bedside at the time of my exam.  In the ED Temp 97.6, heart rate 55-60, blood pressure 174/50, 2 L nasal cannula, 97% Leukocytosis 7.8, hemoglobin 9.3 Creatinine elevated but at baseline 3.44 Potassium elevated - chronically due to CKD - at 5.2 Blood cultures, Urine cultures pending Unasyn started, patient given 1L NaCl bolus  Hospital Course 87 y/o female admitted with difficult to treat enterococcus UTI.  Pt was noted to be dehydrated and given IV fluid bolus with good results.  UTI treated with IV amp/sulbactam and fosfomycin.  Pt feeling better and stable to discharge back to SNF.    Discharge Diagnoses:  Active Problems:   UTI (urinary tract infection)   Discharge Instructions:  Allergies as of 04/24/2020      Reactions   Duloxetine Hcl Other (See Comments)    unknown unknown   Morphine And Related Other (See Comments)   Passed out  Passed out    Other    adhesive   Tape    adhesive   Venlafaxine Other (See Comments)   unknown unknown      Medication List    STOP taking these medications   cephALEXin 500 MG capsule Commonly known as: KEFLEX     TAKE these medications   allopurinol 100 MG tablet Commonly known as: ZYLOPRIM Take 100 mg by mouth daily.   amLODipine 5 MG tablet Commonly known as: NORVASC Take 5 mg by mouth daily.   carboxymethylcellulose 0.5 % Soln Commonly known as: REFRESH PLUS 1 drop in the morning, at noon, and at bedtime.   cetirizine 10 MG tablet Commonly known as: ZYRTEC Take 10 mg by mouth daily as needed for allergies.   famotidine 20 MG tablet Commonly known as: PEPCID Take 20 mg by mouth daily.   feeding supplement (PRO-STAT SUGAR FREE 64) Liqd Take 30 mLs by mouth 3 (three) times daily with meals.   fluticasone 50 MCG/ACT nasal spray Commonly known as: FLONASE Place 2 sprays into both nostrils daily as needed.   Fluticasone-Salmeterol 100-50 MCG/DOSE Aepb Commonly known as: ADVAIR Inhale 1 puff into the lungs 2 (two) times daily.   ipratropium-albuterol 0.5-2.5 (3) MG/3ML Soln Commonly known as: DUONEB Take 3 mLs by nebulization every 6 (six) hours as needed.   Lokelma 10 g Pack packet Generic drug: sodium  zirconium cyclosilicate Take 10 g by mouth daily.   LORazepam 0.5 MG tablet Commonly known as: ATIVAN Take 1 tablet (0.5 mg total) by mouth every 6 (six) hours as needed for anxiety. What changed: medication strength   magnesium oxide 400 MG tablet Commonly known as: MAG-OX Take 400 mg by mouth daily.   NON FORMULARY Diet Change: Regular (NAS), thin liquids   NP Thyroid 90 MG tablet Generic drug: thyroid Take 1 tablet (90 mg total) by mouth daily.   oxyCODONE 20 MG/ML concentrated solution Commonly known as: ROXICODONE INTENSOL Take 0.3 mLs (6 mg total) by mouth every  6 (six) hours as needed for severe pain. What changed: how much to take   OXYGEN Inhale 2 L into the lungs.   rOPINIRole 0.5 MG tablet Commonly known as: REQUIP Take 0.5 mg by mouth at bedtime.   tamsulosin 0.4 MG Caps capsule Commonly known as: FLOMAX Take 0.4 mg by mouth every evening.   traZODone 50 MG tablet Commonly known as: DESYREL Take 50 mg by mouth at bedtime.   trimethoprim 100 MG tablet Commonly known as: TRIMPEX Take 1 tablet (100 mg total) by mouth daily. For UTI Start taking on: April 25, 2020 What changed: These instructions start on April 25, 2020. If you are unsure what to do until then, ask your doctor or other care provider.       Follow-up Information    Doree Albee, MD. Schedule an appointment as soon as possible for a visit in 2 week(s).   Specialty: Internal Medicine Contact information: [place] [address] [phone]              Allergies  Allergen Reactions  . Duloxetine Hcl Other (See Comments)    unknown unknown  . Morphine And Related Other (See Comments)    Passed out  Passed out   . Other     adhesive  . Tape     adhesive  . Venlafaxine Other (See Comments)    unknown unknown   Allergies as of 05/01/2020      Reactions   Duloxetine Hcl Other (See Comments)   unknown unknown   Morphine And Related Other (See Comments)   Passed out  Passed out    Other    adhesive   Tape    adhesive   Venlafaxine Other (See Comments)   unknown unknown      Medication List    STOP taking these medications   cephALEXin 500 MG capsule Commonly known as: KEFLEX     TAKE these medications   allopurinol 100 MG tablet Commonly known as: ZYLOPRIM Take 100 mg by mouth daily.   amLODipine 5 MG tablet Commonly known as: NORVASC Take 5 mg by mouth daily.   carboxymethylcellulose 0.5 % Soln Commonly known as: REFRESH PLUS 1 drop in the morning, at noon, and at bedtime.   cetirizine 10 MG  tablet Commonly known as: ZYRTEC Take 10 mg by mouth daily as needed for allergies.   famotidine 20 MG tablet Commonly known as: PEPCID Take 20 mg by mouth daily.   feeding supplement (PRO-STAT SUGAR FREE 64) Liqd Take 30 mLs by mouth 3 (three) times daily with meals.   fluticasone 50 MCG/ACT nasal spray Commonly known as: FLONASE Place 2 sprays into both nostrils daily as needed.   Fluticasone-Salmeterol 100-50 MCG/DOSE Aepb Commonly known as: ADVAIR Inhale 1 puff into the lungs 2 (two) times daily.   ipratropium-albuterol 0.5-2.5 (3) MG/3ML Soln  Commonly known as: DUONEB Take 3 mLs by nebulization every 6 (six) hours as needed.   Lokelma 10 g Pack packet Generic drug: sodium zirconium cyclosilicate Take 10 g by mouth daily.   LORazepam 0.5 MG tablet Commonly known as: ATIVAN Take 1 tablet (0.5 mg total) by mouth every 6 (six) hours as needed for anxiety. What changed: medication strength   magnesium oxide 400 MG tablet Commonly known as: MAG-OX Take 400 mg by mouth daily.   NON FORMULARY Diet Change: Regular (NAS), thin liquids   NP Thyroid 90 MG tablet Generic drug: thyroid Take 1 tablet (90 mg total) by mouth daily.   oxyCODONE 20 MG/ML concentrated solution Commonly known as: ROXICODONE INTENSOL Take 0.3 mLs (6 mg total) by mouth every 6 (six) hours as needed for severe pain. What changed: how much to take   OXYGEN Inhale 2 L into the lungs.   rOPINIRole 0.5 MG tablet Commonly known as: REQUIP Take 0.5 mg by mouth at bedtime.   tamsulosin 0.4 MG Caps capsule Commonly known as: FLOMAX Take 0.4 mg by mouth every evening.   traZODone 50 MG tablet Commonly known as: DESYREL Take 50 mg by mouth at bedtime.   trimethoprim 100 MG tablet Commonly known as: TRIMPEX Take 1 tablet (100 mg total) by mouth daily. For UTI Start taking on: April 25, 2020 What changed: These instructions start on April 25, 2020. If you are unsure what to do until then,  ask your doctor or other care provider.       Procedures/Studies: US Venous Img Lower Unilateral Right (DVT)  Result Date: 04/15/2020 CLINICAL DATA:  Right lower extremity edema EXAM: RIGHT LOWER EXTREMITY VENOUS DOPPLER ULTRASOUND TECHNIQUE: Gray-scale sonography with graded compression, as well as color Doppler and duplex ultrasound were performed to evaluate the lower extremity deep venous systems from the level of the common femoral vein and including the common femoral, femoral, profunda femoral, popliteal and calf veins including the posterior tibial, peroneal and gastrocnemius veins when visible. The superficial great saphenous vein was also interrogated. Spectral Doppler was utilized to evaluate flow at rest and with distal augmentation maneuvers in the common femoral, femoral and popliteal veins. COMPARISON:  08/10/2016 FINDINGS: Contralateral Common Femoral Vein: Respiratory phasicity is normal and symmetric with the symptomatic side. No evidence of thrombus. Normal compressibility. Common Femoral Vein: No evidence of thrombus. Normal compressibility, respiratory phasicity and response to augmentation. Saphenofemoral Junction: No evidence of thrombus. Normal compressibility and flow on color Doppler imaging. Profunda Femoral Vein: No evidence of thrombus. Normal compressibility and flow on color Doppler imaging. Femoral Vein: No evidence of thrombus. Normal compressibility, respiratory phasicity and response to augmentation. Popliteal Vein: No evidence of thrombus. Normal compressibility, respiratory phasicity and response to augmentation. Calf Veins: No evidence of thrombus. Normal compressibility and flow on color Doppler imaging. Superficial Great Saphenous Vein: No evidence of thrombus. Normal compressibility. Venous Reflux:  None. Other Findings:  None. IMPRESSION: No evidence of right lower extremity deep venous thrombosis. Electronically Signed   By: Davina Poke D.O.   On: 04/15/2020  16:23   DG Chest Port 1 View  Result Date: 04/16/2020 CLINICAL DATA:  Altered mental status EXAM: PORTABLE CHEST 1 VIEW COMPARISON:  02/05/2020 FINDINGS: Cardiac shadow is enlarged but stable. Aortic calcifications are again seen and stable. Lungs are well aerated bilaterally. No focal infiltrate or effusion is seen. No acute bony abnormality is noted. IMPRESSION: No acute abnormality seen. Electronically Signed   By: Linus Mako.D.  On: 04/16/2020 19:03      Subjective: Pt asking to go back to SNF today.  No complaints.   Discharge Exam: Vitals:   05/08/2020 0747 05/09/2020 0821  BP: 131/75   Pulse: (!) 51   Resp: 14   Temp: 98.4 F (36.9 C)   SpO2: 100% 91%   Vitals:   04/19/2020 0323 05/11/2020 0406 04/29/2020 0747 05/04/2020 0821  BP: (!) 122/54 (!) 144/49 131/75   Pulse: (!) 49 (!) 52 (!) 51   Resp: 12 16 14    Temp: 97.6 F (36.4 C) 97.7 F (36.5 C) 98.4 F (36.9 C)   TempSrc: Oral Oral Oral   SpO2: 100% 100% 100% 91%  Weight:      Height:       General: Pt is frail, elderly, alert, awake, not in acute distress Cardiovascular: normal S1/S2 +, no rubs, no gallops Respiratory: CTA bilaterally, no wheezing, no rhonchi Abdominal: Soft, NT, ND, bowel sounds + Extremities:  no cyanosis, diffuse arthritic changes.    The results of significant diagnostics from this hospitalization (including imaging, microbiology, ancillary and laboratory) are listed below for reference.     Microbiology: Recent Results (from the past 240 hour(s))  Urine Culture     Status: Abnormal   Collection Time: 04/16/20 11:06 PM   Specimen: Urine, Clean Catch  Result Value Ref Range Status   Specimen Description   Final    URINE, CLEAN CATCH Performed at [facility], [address], [place], [place] [address]    Special Requests   Final    NONE Performed at [facility], [address], [place], [place] [address]    Culture >=100,000 COLONIES/mL ENTEROCOCCUS FAECALIS (A)  Final   Report Status  04/19/2020 FINAL  Final   Organism ID, Bacteria ENTEROCOCCUS FAECALIS (A)  Final      Susceptibility   Enterococcus faecalis - MIC*    AMPICILLIN <=2 SENSITIVE Sensitive     NITROFURANTOIN <=16 SENSITIVE Sensitive     VANCOMYCIN 2 SENSITIVE Sensitive     * >=100,000 COLONIES/mL ENTEROCOCCUS FAECALIS  SARS Coronavirus 2 by RT PCR (hospital order, performed in [place] hospital lab) Nasopharyngeal Nasopharyngeal Swab     Status: None   Collection Time: 05/08/2020  2:05 AM   Specimen: Nasopharyngeal Swab  Result Value Ref Range Status   SARS Coronavirus 2 NEGATIVE NEGATIVE Final    Comment: (NOTE) SARS-CoV-2 target nucleic acids are NOT DETECTED.  The SARS-CoV-2 RNA is generally detectable in upper and lower respiratory specimens during the acute phase of infection. The lowest concentration of SARS-CoV-2 viral copies this assay can detect is 250 copies / mL. A negative result does not preclude SARS-CoV-2 infection and should not be used as the sole basis for treatment or other patient management decisions.  A negative result may occur with improper specimen collection / handling, submission of specimen other than nasopharyngeal swab, presence of viral mutation(s) within the areas targeted by this assay, and inadequate number of viral copies (<250 copies / mL). A negative result must be combined with clinical observations, patient history, and epidemiological information.  Fact Sheet for Patients:   StrictlyIdeas.no  Fact Sheet for Healthcare Providers: BankingDealers.co.za  This test is not yet approved or  cleared by the [place] FDA and has been authorized for detection and/or diagnosis of SARS-CoV-2 by FDA under an Emergency Use Authorization (EUA).  This EUA will remain in effect (meaning this test can be used) for the duration of the COVID-19 declaration under  Section 564(b)(1) of the Act, 21 U.S.C. section 360bbb-3(b)(1),  unless the authorization is terminated or revoked sooner.  Performed at [facility], [address], [place], [place] [address]      Labs: BNP (last 3 results) Recent Labs    02/05/20 1517  BNP 633.3*   Basic Metabolic Panel: Recent Labs  Lab 04/16/20 1945 05/12/2020 0001 05/11/2020 0430  NA 145 140 142  K 5.3* 5.2* 4.9  CL 111 110 112*  CO2 24 22 22   GLUCOSE 98 104* 95  BUN 74* 57* 53*  CREATININE 5.61* 3.44* 3.33*  CALCIUM 9.7 9.6 9.3   Liver Function Tests: No results for input(s): AST, ALT, ALKPHOS, BILITOT, PROT, ALBUMIN in the last 168 hours. No results for input(s): LIPASE, AMYLASE in the last 168 hours. No results for input(s): AMMONIA in the last 168 hours. CBC: Recent Labs  Lab 04/16/20 1945 04/22/2020 0001 04/28/2020 0430  WBC 7.8 7.8 7.2  NEUTROABS 5.4 6.3  --   HGB 10.4* 9.3* 9.1*  HCT 35.6* 31.0* 30.6*  MCV 106.9* 104.0* 105.9*  PLT 255 230 206   Cardiac Enzymes: No results for input(s): CKTOTAL, CKMB, CKMBINDEX, TROPONINI in the last 168 hours. BNP: Invalid input(s): POCBNP CBG: Recent Labs  Lab 04/19/20 2223  GLUCAP 96   D-Dimer No results for input(s): DDIMER in the last 72 hours. Hgb A1c No results for input(s): HGBA1C in the last 72 hours. Lipid Profile No results for input(s): CHOL, HDL, LDLCALC, TRIG, CHOLHDL, LDLDIRECT in the last 72 hours. Thyroid function studies No results for input(s): TSH, T4TOTAL, T3FREE, THYROIDAB in the last 72 hours.  Invalid input(s): FREET3 Anemia work up No results for input(s): VITAMINB12, FOLATE, FERRITIN, TIBC, IRON, RETICCTPCT in the last 72 hours. Urinalysis    Component Value Date/Time   COLORURINE YELLOW 04/19/2020 2336   APPEARANCEUR CLOUDY (A) 04/19/2020 2336   LABSPEC >1.030 (H) 04/19/2020 2336   PHURINE 5.5 04/19/2020 2336   GLUCOSEU NEGATIVE 04/19/2020 2336   HGBUR LARGE (A) 04/19/2020 2336   BILIRUBINUR NEGATIVE 04/19/2020 [place] 04/19/2020 2336   PROTEINUR 100 (A)  04/19/2020 2336   NITRITE NEGATIVE 04/19/2020 2336   LEUKOCYTESUR MODERATE (A) 04/19/2020 2336   Sepsis Labs Invalid input(s): PROCALCITONIN,  WBC,  LACTICIDVEN Microbiology Recent Results (from the past 240 hour(s))  Urine Culture     Status: Abnormal   Collection Time: 04/16/20 11:06 PM   Specimen: Urine, Clean Catch  Result Value Ref Range Status   Specimen Description   Final    URINE, CLEAN CATCH Performed at [facility], [address], [place], [place] [address]    Special Requests   Final    NONE Performed at [facility], [address] [place], [place] [address]    Culture >=100,000 COLONIES/mL ENTEROCOCCUS FAECALIS (A)  Final   Report Status 04/19/2020 FINAL  Final   Organism ID, Bacteria ENTEROCOCCUS FAECALIS (A)  Final      Susceptibility   Enterococcus faecalis - MIC*    AMPICILLIN <=2 SENSITIVE Sensitive     NITROFURANTOIN <=16 SENSITIVE Sensitive     VANCOMYCIN 2 SENSITIVE Sensitive     * >=100,000 COLONIES/mL ENTEROCOCCUS FAECALIS  SARS Coronavirus 2 by RT PCR (hospital order, performed in [place] hospital lab) Nasopharyngeal Nasopharyngeal Swab     Status: None   Collection Time: 04/16/2020  2:05 AM   Specimen: Nasopharyngeal Swab  Result Value Ref Range Status   SARS Coronavirus 2 NEGATIVE NEGATIVE Final  Comment: (NOTE) SARS-CoV-2 target nucleic acids are NOT DETECTED.  The SARS-CoV-2 RNA is generally detectable in upper and lower respiratory specimens during the acute phase of infection. The lowest concentration of SARS-CoV-2 viral copies this assay can detect is 250 copies / mL. A negative result does not preclude SARS-CoV-2 infection and should not be used as the sole basis for treatment or other patient management decisions.  A negative result may occur with improper specimen collection / handling, submission of specimen other than nasopharyngeal swab, presence of viral mutation(s) within the areas targeted by this assay, and inadequate number  of viral copies (<250 copies / mL). A negative result must be combined with clinical observations, patient history, and epidemiological information.  Fact Sheet for Patients:   StrictlyIdeas.no  Fact Sheet for Healthcare Providers: BankingDealers.co.za  This test is not yet approved or  cleared by the [place] FDA and has been authorized for detection and/or diagnosis of SARS-CoV-2 by FDA under an Emergency Use Authorization (EUA).  This EUA will remain in effect (meaning this test can be used) for the duration of the COVID-19 declaration under Section 564(b)(1) of the Act, 21 U.S.C. section 360bbb-3(b)(1), unless the authorization is terminated or revoked sooner.  Performed at [facility], [address], [place], [place] [address]    Time coordinating discharge:  SIGNED:  Irwin Brakeman, MD  [facility] 04/25/2020, 11:35 AM How to contact the [facility] Attending or Consulting provider [place] or covering provider during after hours [place], for this patient?  1. Check the care team in [facility] and look for a) attending/consulting [facility] provider listed and b) the [facility] team listed 2. Log into [email or web address] and use [place]'s universal password to access. If you do not have the password, please contact the hospital operator. 3. Locate the [facility] provider you are looking for under [facility] and page to a number that you can be directly reached. 4. If you still have difficulty reaching the provider, please page the [facility] (Director on Call) for the Hospitalists listed on amion for assistance.

## 2020-04-21 ENCOUNTER — Non-Acute Institutional Stay (SKILLED_NURSING_FACILITY): Payer: Medicare Other | Admitting: Adult Health

## 2020-04-21 ENCOUNTER — Encounter: Payer: Self-pay | Admitting: Internal Medicine

## 2020-04-21 ENCOUNTER — Other Ambulatory Visit (HOSPITAL_COMMUNITY)
Admission: RE | Admit: 2020-04-21 | Discharge: 2020-04-21 | Disposition: A | Discharge status: Home / Self Care | Payer: Medicare Other | Source: Skilled Nursing Facility | Attending: Internal Medicine | Admitting: Internal Medicine

## 2020-04-21 ENCOUNTER — Encounter: Payer: Self-pay | Admitting: Adult Health

## 2020-04-21 DIAGNOSIS — E1122 Type 2 diabetes mellitus with diabetic chronic kidney disease: Secondary | ICD-10-CM | POA: Diagnosis not present

## 2020-04-21 DIAGNOSIS — I2510 Atherosclerotic heart disease of native coronary artery without angina pectoris: Secondary | ICD-10-CM

## 2020-04-21 DIAGNOSIS — I120 Hypertensive chronic kidney disease with stage 5 chronic kidney disease or end stage renal disease: Secondary | ICD-10-CM | POA: Diagnosis not present

## 2020-04-21 DIAGNOSIS — N185 Chronic kidney disease, stage 5: Secondary | ICD-10-CM | POA: Diagnosis present

## 2020-04-21 DIAGNOSIS — I501 Left ventricular failure: Secondary | ICD-10-CM

## 2020-04-21 DIAGNOSIS — J849 Interstitial pulmonary disease, unspecified: Secondary | ICD-10-CM

## 2020-04-21 DIAGNOSIS — F0150 Vascular dementia without behavioral disturbance: Secondary | ICD-10-CM

## 2020-04-21 DIAGNOSIS — D539 Nutritional anemia, unspecified: Secondary | ICD-10-CM | POA: Insufficient documentation

## 2020-04-21 DIAGNOSIS — I700 Atherosclerosis of aorta: Secondary | ICD-10-CM

## 2020-04-21 DIAGNOSIS — E039 Hypothyroidism, unspecified: Secondary | ICD-10-CM

## 2020-04-21 LAB — CBC WITH DIFFERENTIAL/PLATELET
Abs Immature Granulocytes: 0.1 10*3/uL — ABNORMAL HIGH (ref 0.00–0.07)
Basophils Absolute: 0 10*3/uL (ref 0.0–0.1)
Basophils Relative: 1 %
Eosinophils Absolute: 0.3 10*3/uL (ref 0.0–0.5)
Eosinophils Relative: 5 %
HCT: 30 % — ABNORMAL LOW (ref 36.0–46.0)
Hemoglobin: 9.1 g/dL — ABNORMAL LOW (ref 12.0–15.0)
Immature Granulocytes: 2 %
Lymphocytes Relative: 11 %
Lymphs Abs: 0.6 10*3/uL — ABNORMAL LOW (ref 0.7–4.0)
MCH: 31.3 pg (ref 26.0–34.0)
MCHC: 30.3 g/dL (ref 30.0–36.0)
MCV: 103.1 fL — ABNORMAL HIGH (ref 80.0–100.0)
Monocytes Absolute: 0.5 10*3/uL (ref 0.1–1.0)
Monocytes Relative: 10 %
Neutro Abs: 4.1 10*3/uL (ref 1.7–7.7)
Neutrophils Relative %: 71 %
Platelets: 225 10*3/uL (ref 150–400)
RBC: 2.91 MIL/uL — ABNORMAL LOW (ref 3.87–5.11)
RDW: 14.3 % (ref 11.5–15.5)
WBC: 5.7 10*3/uL (ref 4.0–10.5)
nRBC: 0 % (ref 0.0–0.2)

## 2020-04-21 LAB — RENAL FUNCTION PANEL
Albumin: 3 g/dL — ABNORMAL LOW (ref 3.5–5.0)
Anion gap: 7 (ref 5–15)
BUN: 47 mg/dL — ABNORMAL HIGH (ref 8–23)
CO2: 20 mmol/L — ABNORMAL LOW (ref 22–32)
Calcium: 9.6 mg/dL (ref 8.9–10.3)
Chloride: 113 mmol/L — ABNORMAL HIGH (ref 98–111)
Creatinine, Ser: 3.17 mg/dL — ABNORMAL HIGH (ref 0.44–1.00)
GFR, Estimated: 13 mL/min — ABNORMAL LOW (ref >60)
Glucose, Bld: 61 mg/dL — ABNORMAL LOW (ref 70–99)
Phosphorus: 4.1 mg/dL (ref 2.5–4.6)
Potassium: 4.8 mmol/L (ref 3.5–5.1)
Sodium: 140 mmol/L (ref 135–145)

## 2020-04-21 NOTE — Progress Notes (Addendum)
Location:  [place] Room Number: 151/P Place of Service:  SNF (31)   CODE STATUS: DNR  Allergies  Allergen Reactions  . Duloxetine Hcl Other (See Comments)    unknown unknown  . Morphine And Related Other (See Comments)    Passed out  Passed out   . Other     adhesive  . Tape     adhesive  . Venlafaxine Other (See Comments)    unknown unknown    Chief Complaint  Patient presents with  . Follow-up    Follow Up ED    HPI:  She has been in the ED once again for changes in mental status. Her po intake remains very poor to nothing. Her final urine culture returned with enterococcus; she was taken to the ED for IVF and IV abt. She did receive some fluids. And abt; while in the ED. Today she is lethargic and not responsive to verbal stimuli.there are no reports of fevers present. 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 Anemia   . Arthritis   . Cerebral infarction ([place])   . Coronary artery disease   . Dementia ([place])   . Diabetes mellitus without complication (HCC)    Type 2  . Dysphagia due to recent cerebral infarction   . Failure to thrive (0-17)   . Hypercalcemia   . Hypertension   . Pneumonitis   . Protein calorie malnutrition ([place])   . Subdural hematoma ([place])   . Thyroid disease     History reviewed. No pertinent surgical history.  Social History   Socioeconomic History  . Marital status: Married    Spouse name: Not on file  . Number of children: Not on file  . Years of education: Not on file  . Highest education level: Not on file  Occupational History  . Not on file  Tobacco Use  . Smoking status: Never Smoker  . Smokeless tobacco: Never Used  Vaping Use  . Vaping Use: Never used  Substance and Sexual Activity  . Alcohol use:  No  . Drug use: No  . Sexual activity: Not Currently  Other Topics Concern  . Not on file  Social History Narrative   Widow,husband passed away 2020,married for 57 years.Retired.   Social Determinants of Health   Financial Resource Strain: Not on file  Food Insecurity: Not on file  Transportation Needs: Not on file  Physical Activity: Not on file  Stress: Not on file  Social Connections: Not on file  Intimate Partner Violence: Not on file   Family History  Family history unknown: Yes      VITAL SIGNS BP (!) 152/69   Pulse (!) 59   Temp 98.7 F (37.1 C)   Resp 20   Ht 5\' 3"  (1.6 m)   Wt 140 lb 3.2 oz (63.6 kg)   SpO2 97%   BMI 24.84 kg/m   Outpatient Encounter Medications as of 04/21/2020  Medication Sig  . allopurinol (ZYLOPRIM) 100 MG tablet Take 100 mg by mouth daily.  . Amino Acids-Protein Hydrolys (FEEDING SUPPLEMENT, PRO-STAT SUGAR FREE 64,) LIQD Take 30 mLs by mouth 3 (three) times daily with meals.  Marland Kitchen amLODipine (NORVASC) 5 MG tablet Take 5 mg by mouth daily.  . carboxymethylcellulose (REFRESH TEARS) 0.5 % SOLN  1 drop 2 (two) times daily.  . cetirizine (ZYRTEC) 10 MG tablet Take 10 mg by mouth daily as needed for allergies.  Derrill Memo ON 04/22/2020] cyanocobalamin 1000 MCG tablet Take 1,000 mcg by mouth daily.  . famotidine (PEPCID) 20 MG tablet Take 20 mg by mouth daily.  . fluticasone (FLONASE) 50 MCG/ACT nasal spray Place 2 sprays into both nostrils daily as needed.  . Fluticasone-Salmeterol (ADVAIR) 100-50 MCG/DOSE AEPB Inhale 1 puff into the lungs 2 (two) times daily.  Marland Kitchen ipratropium-albuterol (DUONEB) 0.5-2.5 (3) MG/3ML SOLN Take 3 mLs by nebulization every 6 (six) hours as needed.  Marland Kitchen LORazepam (ATIVAN) 0.5 MG tablet Take 1 tablet (0.5 mg total) by mouth every 6 (six) hours as needed for anxiety.  . magnesium oxide (MAG-OX) 400 MG tablet Take 400 mg by mouth daily.   . mupirocin ointment (BACTROBAN) 2 % 1 application 2 (two) times daily. Special Instructions:  + for MRSA in NARES. Apply BID x 5 days  . NON FORMULARY Diet Change: Regular (NAS), thin liquids  . NP THYROID 90 MG tablet Take 1 tablet (90 mg total) by mouth daily.  Marland Kitchen oxyCODONE (ROXICODONE INTENSOL) 20 MG/ML concentrated solution Take 5 mg by mouth every 6 (six) hours as needed for severe pain.  . OXYGEN Inhale 2 L into the lungs.  Marland Kitchen rOPINIRole (REQUIP) 0.5 MG tablet Take 0.5 mg by mouth at bedtime.  . sodium zirconium cyclosilicate (LOKELMA) 10 g PACK packet Take 10 g by mouth daily.  . tamsulosin (FLOMAX) 0.4 MG CAPS capsule Take 0.4 mg by mouth every evening.  . traZODone (DESYREL) 50 MG tablet Take 50 mg by mouth at bedtime.   No facility-administered encounter medications on file as of 04/21/2020.     SIGNIFICANT DIAGNOSTIC EXAMS   PREVIOUS   11-14-16:  Ct of head:  1. No acute intracranial abnormality identified. 2. 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 3. Moderate for age chronic microvascular ischemic changes and parenchymal volume loss of the brain. 4. Bilateral mastoid effusions. 5. 12 mm nodule left suboccipital scalp, direct visualization recommended, probably sebaceous cyst.  02-05-20: chest x-ray:  1. Chronic interstitial lung disease with mild left basilar atelectasis and/or infiltrate. 2. Small left pleural effusion.  02-06-20: ct of chest:  1. Small bilateral pleural effusions and associated atelectasis or consolidation. 2. Diffuse interlobular septal thickening, consistent with pulmonary edema. 3. Diffuse bilateral bronchial wall thickening, nonspecific and may reflect infectious or inflammatory bronchitis or edema. 4. Bandlike scarring and or atelectasis of the left lower lobe and right middle lobe, most likely sequelae of prior infection. 5. No specific findings to suggest fibrotic interstitial lung disease, although assessment is limited given the presence of edema and  effusions. 6. Cardiomegaly and coronary artery disease. 7. Enlargement of the main pulmonary artery, as can be seen in pulmonary hypertension. 8. Enlarged mediastinal lymph nodes, nonspecific and likely reactive.  9. Aortic Atherosclerosis   02-08-20: renal ultrasound:  1. No acute abnormality. No hydronephrosis or bladder distention. 2. Bilateral simple renal cysts. Bilateral renal cortical thinning. Right kidney is slightly atrophic.  03-04-20: chest x-ray (Dr Darrol Angel report);  Bilateral pleural effusions R>L Increased interstitial markings bilateral upper lobes.  Bilateral pulmonary edema Significant osteoporosis Cardiomegaly   NO NEW EXAMS.   LABS REVIEWED PREVIOUS   02-09-20: wbc 14.3; hgb 8.5; hct 27.5; mcv 102.2 plt 207;  02-10-20: hgb a1c 7.0 02-12-20: glucose 241; bun 88; creat 2.71; k+  5.0; na++ 138; ca 8.8 phos 5.1 albumin 2.7  02-25-20: wbc 14.3; hgb 8.5; hct 27.5; mcv 102.2 plt 207; glucose 102; bun 44; creat 2.70; k+ 6.4; na++ 140 ca 8.6 liver normal albumin 2.8 tsh 7.031vit D 77.07 02-26-20: glucose 99; bun 37; creat 2.54; k+ 5.8; na++ 141; liver normal albumin 2.7 02-28-20: glucose 120; bun 39; creat 2.66; k+ 6.9; na++ 140; ca 8.6  03-04-20: wbc 6.4; hgb 7.7; hct 26.4; mcv 107.3 plt 225; glucose 76; bun 30; creat 3.01; k+ 5.4; na++ 144; ca 8.8  03-05-20: glucose 72; bun 30; creat 3.24; k+ 5.4; na++ 144; ca 8.9  03-11-20: hgb 8.4; hct 28.1; glucose 79; bun 37; creat 3.66; k+ 6.0; na++ 144 ca 9.1 GFR 11  03-18-20: glucose 92; bun 40; creat 4.51; k+ 5.6; na++ 143; ca 9.1 GFR 9  03-24-20: glucose 83; bun 55; creat 4.07; k+ 5.5; na++ 142; ca 9.3 GFR 10; vit B 12: 318; vit D 75.09; uric acid 8.1 04-09-20 glucose 120; bun 74; creat 4.10 k+ 5.5; na++ 137; ca 9.5 GFR 10  TODAY  04-16-20: wbc 7.8; hgb 10.4; hct 35.6; mcv 106.9 plt 255; glucose 98; bun 74; creat 5.61; k+ 5.3; na++ 145 ca 9.7 GFR 7; urine culture: enterococcus faecalis:  04-19-20: urine culture: no growth 04/21/2020:  wbc 7.8; hgb 9.3; hct 31.0; mcv 104.0 plt 230; glucose 104; bun 57; creat 3.44; k+ 5.2; na++ 140; ca 9.6; GFR 12; blood culture: no growth  Review of Systems  Unable to perform ROS: Medical condition   Physical Exam Constitutional:      General: She is not in acute distress.    Appearance: She is well-developed and well-nourished. She is not diaphoretic.  HENT:     Mouth/Throat:     Mouth: Mucous membranes are dry.  Neck:     Thyroid: No thyromegaly.  Cardiovascular:     Rate and Rhythm: Bradycardia present. Rhythm irregular.     Pulses: Normal pulses and intact distal pulses.     Heart sounds: Murmur heard.    Pulmonary:     Effort: Pulmonary effort is normal. No respiratory distress.     Breath sounds: Normal breath sounds.     Comments: 02 Abdominal:     General: Bowel sounds are normal. There is no distension.     Palpations: Abdomen is soft.     Tenderness: There is no abdominal tenderness.  Musculoskeletal:        General: No edema.     Right lower leg: No edema.     Left lower leg: No edema.     Comments: Is able to move all extremities   Lymphadenopathy:     Cervical: No cervical adenopathy.  Skin:    General: Skin is warm and dry.  Neurological:     Comments: Is aware of surroundings.   Psychiatric:        Mood and Affect: Mood and affect normal.      ASSESSMENT/ PLAN:   TODAY  1. Chronic UTI / lower urinary tract symptoms (LUTS) has been treated for enterococcus uti; will continue flomax 0.4 mg daily trimpex 100 mg daily   2. History of CVA: in adult hood: is stable will continue to monitor   3. 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  Will continue lokelame 10 gm daily for her hypokalemia   4. Chronic interstitial lung disease is stable is on  02; will continue advair 100/50 1 puff twice daily has oxyfast 5 mg every 6 hours as needed has duoneb every 6 hours as needed has ativan  0.5 mg every 6 hours as needed for anxiety   5. Acquired hypothyroidism: is stable will continue NP thyroid 90 mg daily   6. Vascular dementia without behavioral disturbance: is without change will monitor   7. Chronic constipation: is stable will continue to monitor   8. Chronic non-seasonal allergic rhinitis: is stable will continue flonase twice daily as neededand zyrtec 10 mg daily as needed   9. Chronic insomnia: is stable will continue trazodone 50 mg nightly   10. GERD without esophagitis: is stable will continue pepcid 20 mg daily   11. Controlled type 2 diabetes mellitus with stage 5 chronic kidney disease not on hemodialysis without long term current use of insulin: is stable hgb a1c 7.9 will monitor   12. Benign hypertension with CKD (chronic kidney disease) stage 5 is stable 152/69 will continue norvasc 5 mg daily benign   13. Hyperlipidemia associated with type 2 diabetes mellitus is off lipitor is stable   14. Aortic atherosclerosis is stable will monitor   15. Coronary artery disease involving native coronary artery of native heart without angina: is off medications will monitor   16. Vitamin B 12 deficiency: level is 318 will begin vit B12 1000 mcg daily   17. RLS  Is stable will continue requip 0.5 mg daily  18. Nasal MRSA is on batroban 2% twice daily for 5 days      MD is aware of resident's narcotic use and is in agreement with current plan of care. We will attempt to wean resident as appropriate.  Ok Edwards NP [facility]  Contact [phone] Monday through Friday 8am- 5pm  After hours call [phone]   ADDENDUM:  Dr Linna Darner has spoken with her son in law. Will stop allopurinol zyrtec; mag ox; norvasc; trimpex; requip

## 2020-04-21 NOTE — Progress Notes (Signed)
Pt was treated with fosfomycin in the hospital.  Murvin Natal MD

## 2020-04-23 LAB — URINE CULTURE: Culture: 10000 — AB

## 2020-04-25 ENCOUNTER — Non-Acute Institutional Stay (SKILLED_NURSING_FACILITY): Payer: Medicare Other | Admitting: Internal Medicine

## 2020-04-25 ENCOUNTER — Encounter: Payer: Self-pay | Admitting: Internal Medicine

## 2020-04-25 ENCOUNTER — Encounter (HOSPITAL_COMMUNITY): Admission: RE | Admit: 2020-04-25 | Payer: Medicare Other | Source: Skilled Nursing Facility | Admitting: *Deleted

## 2020-04-25 ENCOUNTER — Other Ambulatory Visit (HOSPITAL_COMMUNITY)
Admission: RE | Admit: 2020-04-25 | Discharge: 2020-04-25 | Disposition: A | Discharge status: Home / Self Care | Payer: Medicare Other | Source: Skilled Nursing Facility | Attending: Internal Medicine | Admitting: Internal Medicine

## 2020-04-25 DIAGNOSIS — I2510 Atherosclerotic heart disease of native coronary artery without angina pectoris: Secondary | ICD-10-CM | POA: Diagnosis present

## 2020-04-25 DIAGNOSIS — R509 Fever, unspecified: Secondary | ICD-10-CM

## 2020-04-25 DIAGNOSIS — R0689 Other abnormalities of breathing: Secondary | ICD-10-CM | POA: Diagnosis not present

## 2020-04-25 LAB — CULTURE, BLOOD (ROUTINE X 2)
Culture: NO GROWTH
Culture: NO GROWTH
Special Requests: ADEQUATE
Special Requests: ADEQUATE

## 2020-04-25 LAB — HEMOGLOBIN AND HEMATOCRIT, BLOOD
HCT: 31 % — ABNORMAL LOW (ref 36.0–46.0)
Hemoglobin: 9.2 g/dL — ABNORMAL LOW (ref 12.0–15.0)

## 2020-04-25 NOTE — Progress Notes (Signed)
   NURSING HOME LOCATION:  [facility] ROOM NUMBER:  [id number] P  CODE STATUS:  DNR  PCP:  Hurshel Party MD  This is a nursing facility follow up visit for specific acute issue of fever.  Interim medical record and care since last [place] visit was updated with review of diagnostic studies and change in clinical status since last visit were documented.  HPI: Staff reports that she has become less responsive and has great difficulty swallowing pills or nutrition.  O2 sats on 2 L dropped to 84%.  Temp was noted to be 101 this morning.  This is in the context of positive MRSA screening 2/6 and urine culture positive for Enterococcus faecalis on 2/2.  The organism was sensitive to ampicillin, nitrofurantoin, and vancomycin. Blood cultures were reported as negative at 5 days on 2/6.  Review of systems: Patient is responsive only as crying out "oh,oh or no, don't do that" when touched during the examination.  Physical exam:  Pertinent or positive findings: She appears chronically ill.  She kept the right eye closed.  When I attempted to open the eye she yelled out "no".  There was no evidence of conjunctivitis.  There is a tremor or myoclonic activity of the mandible.  Tongue is dry.  She is wearing nasal oxygen.  Heart rate is surprisingly slow with an increased S1.  Bronchovesicular breath sounds are present on the right.  Breath sounds are markedly decreased on the left.  Although the abdomen was soft she cried out as Ilightly palpated the quadrants. Hands slightly edematous w/o pitting. Pedal pulses were surprisingly good.  She also exhibits a tremor of the left upper extremity.  As I was examining her, her daughter came in; but there was no focused contact or interaction with her daughter.  General appearance:  no acute distress, increased work of breathing is present despite hypoxia.   Lymphatic: No lymphadenopathy about the head, neck, axilla. Eyes: No  conjunctival inflammation or lid edema is present. There is no scleral icterus. Ears:  External ear exam shows no significant lesions or deformities.   Nose:  External nasal examination shows no deformity or inflammation. Nasal mucosa are pink and moist without lesions, exudates Neck:  No thyromegaly, masses, tenderness noted.    Heart:  No gallop, murmur, click, rub .  Lungs:  without wheezes, rhonchi, rales, rubs. Abdomen: Bowel sounds are normal. Abdomen is soft with no organomegaly, hernias, masses. GU: Deferred  Extremities:  No cyanosis, clubbing, edema  Neurologic exam : Balance, [place], finger to nose testing could not be completed due to clinical state Skin: Warm & dry. No significant lesions or rash.  See summary under each active problem in the Problem List with associated updated therapeutic plan

## 2020-04-25 NOTE — Patient Instructions (Signed)
See assessment and plan under each diagnosis acutely for this visit

## 2020-05-13 DEATH — deceased
